# Patient Record
Sex: Female | Born: 1960 | Race: Black or African American | Hispanic: No | Smoking: Former smoker
Health system: Southern US, Community
[De-identification: ages and names within clinical notes are randomized; demographics above are authoritative.]

## PROBLEM LIST (undated history)

## (undated) DIAGNOSIS — I10 Essential (primary) hypertension: Secondary | ICD-10-CM

## (undated) DIAGNOSIS — R0602 Shortness of breath: Secondary | ICD-10-CM

## (undated) DIAGNOSIS — E538 Deficiency of other specified B group vitamins: Secondary | ICD-10-CM

## (undated) DIAGNOSIS — J449 Chronic obstructive pulmonary disease, unspecified: Secondary | ICD-10-CM

## (undated) DIAGNOSIS — E119 Type 2 diabetes mellitus without complications: Secondary | ICD-10-CM

## (undated) DIAGNOSIS — I509 Heart failure, unspecified: Secondary | ICD-10-CM

## (undated) HISTORY — PX: ECTOPIC PREGNANCY SURGERY: SHX613

## (undated) HISTORY — DX: Heart failure, unspecified: I50.9

## (undated) HISTORY — DX: Morbid (severe) obesity due to excess calories: E66.01

## (undated) HISTORY — DX: Essential (primary) hypertension: I10

## (undated) HISTORY — PX: KNEE SURGERY: SHX244

## (undated) HISTORY — DX: Shortness of breath: R06.02

## (undated) HISTORY — DX: Chronic obstructive pulmonary disease, unspecified: J44.9

## (undated) HISTORY — DX: Deficiency of other specified B group vitamins: E53.8

---

## 2010-08-25 ENCOUNTER — Emergency Department (HOSPITAL_COMMUNITY): Admission: EM | Admit: 2010-08-25 | Discharge: 2010-08-25 | Payer: Self-pay | Admitting: Emergency Medicine

## 2011-01-07 ENCOUNTER — Inpatient Hospital Stay (HOSPITAL_COMMUNITY)
Admission: EM | Admit: 2011-01-07 | Discharge: 2011-01-09 | DRG: 204 | Disposition: A | Discharge status: Home / Self Care | Payer: Medicaid Other | Attending: Family Medicine | Admitting: Family Medicine

## 2011-01-07 ENCOUNTER — Emergency Department (HOSPITAL_COMMUNITY): Payer: Medicaid Other

## 2011-01-07 DIAGNOSIS — I059 Rheumatic mitral valve disease, unspecified: Secondary | ICD-10-CM | POA: Diagnosis present

## 2011-01-07 DIAGNOSIS — I110 Hypertensive heart disease with heart failure: Secondary | ICD-10-CM | POA: Diagnosis present

## 2011-01-07 DIAGNOSIS — J449 Chronic obstructive pulmonary disease, unspecified: Secondary | ICD-10-CM | POA: Diagnosis present

## 2011-01-07 DIAGNOSIS — I5032 Chronic diastolic (congestive) heart failure: Secondary | ICD-10-CM | POA: Diagnosis present

## 2011-01-07 DIAGNOSIS — J4489 Other specified chronic obstructive pulmonary disease: Secondary | ICD-10-CM | POA: Diagnosis present

## 2011-01-07 DIAGNOSIS — I279 Pulmonary heart disease, unspecified: Secondary | ICD-10-CM | POA: Diagnosis present

## 2011-01-07 DIAGNOSIS — R0602 Shortness of breath: Principal | ICD-10-CM | POA: Diagnosis present

## 2011-01-07 LAB — BRAIN NATRIURETIC PEPTIDE: Pro B Natriuretic peptide (BNP): 141 pg/mL — ABNORMAL HIGH (ref 0.0–100.0)

## 2011-01-07 LAB — URINALYSIS, ROUTINE W REFLEX MICROSCOPIC
Bilirubin Urine: NEGATIVE
Glucose, UA: NEGATIVE mg/dL
Glucose, UA: NEGATIVE mg/dL
Ketones, ur: 15 mg/dL — AB
Ketones, ur: NEGATIVE mg/dL
Nitrite: NEGATIVE
Nitrite: NEGATIVE
Protein, ur: >300 mg/dL — AB
Protein, ur: NEGATIVE mg/dL
Specific Gravity, Urine: 1.01 (ref 1.005–1.030)
Specific Gravity, Urine: 1.023 (ref 1.005–1.030)
Urobilinogen, UA: 1 mg/dL (ref 0.0–1.0)
Urobilinogen, UA: 2 mg/dL — ABNORMAL HIGH (ref 0.0–1.0)
pH: 5.5 (ref 5.0–8.0)
pH: 6 (ref 5.0–8.0)

## 2011-01-07 LAB — BASIC METABOLIC PANEL
BUN: 20 mg/dL (ref 6–23)
CO2: 30 mEq/L (ref 19–32)
Calcium: 8.6 mg/dL (ref 8.4–10.5)
Chloride: 103 mEq/L (ref 96–112)
Creatinine, Ser: 0.99 mg/dL (ref 0.4–1.2)
GFR calc Af Amer: >60 mL/min (ref >60)
GFR calc non Af Amer: 59 mL/min — ABNORMAL LOW (ref >60)
Glucose, Bld: 97 mg/dL (ref 70–99)
Potassium: 3.6 mEq/L (ref 3.5–5.1)
Sodium: 139 mEq/L (ref 135–145)

## 2011-01-07 LAB — CBC
HCT: 33.7 % — ABNORMAL LOW (ref 36.0–46.0)
Hemoglobin: 10.1 g/dL — ABNORMAL LOW (ref 12.0–15.0)
MCH: 21.9 pg — ABNORMAL LOW (ref 26.0–34.0)
MCHC: 30 g/dL (ref 30.0–36.0)
MCV: 73.1 fL — ABNORMAL LOW (ref 78.0–100.0)
Platelets: 471 10*3/uL — ABNORMAL HIGH (ref 150–400)
RBC: 4.61 MIL/uL (ref 3.87–5.11)
RDW: 21.4 % — ABNORMAL HIGH (ref 11.5–15.5)
WBC: 11.1 10*3/uL — ABNORMAL HIGH (ref 4.0–10.5)

## 2011-01-07 LAB — URINE MICROSCOPIC-ADD ON

## 2011-01-07 LAB — DIFFERENTIAL
Basophils Absolute: 0 10*3/uL (ref 0.0–0.1)
Eosinophils Absolute: 0.1 10*3/uL (ref 0.0–0.7)
Lymphocytes Relative: 26 % (ref 12–46)
Monocytes Absolute: 1 10*3/uL (ref 0.1–1.0)
Neutrophils Relative %: 64 % (ref 43–77)

## 2011-01-07 LAB — POCT CARDIAC MARKERS
CKMB, poc: 1.8 ng/mL (ref 1.0–8.0)
Myoglobin, poc: 105 ng/mL (ref 12–200)

## 2011-01-08 DIAGNOSIS — I509 Heart failure, unspecified: Secondary | ICD-10-CM

## 2011-01-08 DIAGNOSIS — I10 Essential (primary) hypertension: Secondary | ICD-10-CM

## 2011-01-08 DIAGNOSIS — J449 Chronic obstructive pulmonary disease, unspecified: Secondary | ICD-10-CM

## 2011-01-08 DIAGNOSIS — R0602 Shortness of breath: Secondary | ICD-10-CM

## 2011-01-08 LAB — COMPREHENSIVE METABOLIC PANEL
AST: 81 U/L — ABNORMAL HIGH (ref 0–37)
Albumin: 3.2 g/dL — ABNORMAL LOW (ref 3.5–5.2)
Alkaline Phosphatase: 147 U/L — ABNORMAL HIGH (ref 39–117)
BUN: 20 mg/dL (ref 6–23)
Chloride: 103 mEq/L (ref 96–112)
GFR calc Af Amer: >60 mL/min (ref >60)
Potassium: 3.3 mEq/L — ABNORMAL LOW (ref 3.5–5.1)
Total Protein: 6.8 g/dL (ref 6.0–8.3)

## 2011-01-08 LAB — URINALYSIS, ROUTINE W REFLEX MICROSCOPIC
Bilirubin Urine: NEGATIVE
Glucose, UA: NEGATIVE mg/dL
Hgb urine dipstick: NEGATIVE
Protein, ur: NEGATIVE mg/dL
Specific Gravity, Urine: 1.017 (ref 1.005–1.030)

## 2011-01-08 LAB — CBC
HCT: 33.5 % — ABNORMAL LOW (ref 36.0–46.0)
Hemoglobin: 10 g/dL — ABNORMAL LOW (ref 12.0–15.0)
MCH: 21.7 pg — ABNORMAL LOW (ref 26.0–34.0)
MCV: 72.7 fL — ABNORMAL LOW (ref 78.0–100.0)
Platelets: 430 10*3/uL — ABNORMAL HIGH (ref 150–400)
RBC: 4.61 MIL/uL (ref 3.87–5.11)
WBC: 11.2 10*3/uL — ABNORMAL HIGH (ref 4.0–10.5)

## 2011-01-08 LAB — CARDIAC PANEL(CRET KIN+CKTOT+MB+TROPI)
CK, MB: 2.8 ng/mL (ref 0.3–4.0)
Relative Index: 1.7 (ref 0.0–2.5)

## 2011-01-09 DIAGNOSIS — I10 Essential (primary) hypertension: Secondary | ICD-10-CM

## 2011-01-09 LAB — COMPREHENSIVE METABOLIC PANEL
BUN: 19 mg/dL (ref 6–23)
CO2: 34 mEq/L — ABNORMAL HIGH (ref 19–32)
Chloride: 100 mEq/L (ref 96–112)
Creatinine, Ser: 0.99 mg/dL (ref 0.4–1.2)
GFR calc non Af Amer: 59 mL/min — ABNORMAL LOW (ref >60)
Total Bilirubin: 0.8 mg/dL (ref 0.3–1.2)

## 2011-01-09 LAB — URINE CULTURE
Colony Count: NO GROWTH
Culture: NO GROWTH

## 2011-01-09 LAB — CBC
Hemoglobin: 10.2 g/dL — ABNORMAL LOW (ref 12.0–15.0)
RBC: 4.78 MIL/uL (ref 3.87–5.11)
WBC: 8.8 10*3/uL (ref 4.0–10.5)

## 2011-01-15 ENCOUNTER — Inpatient Hospital Stay (INDEPENDENT_AMBULATORY_CARE_PROVIDER_SITE_OTHER)
Admission: RE | Admit: 2011-01-15 | Discharge: 2011-01-15 | Disposition: A | Discharge status: Home / Self Care | Payer: Self-pay | Source: Ambulatory Visit

## 2011-01-15 DIAGNOSIS — Z0489 Encounter for examination and observation for other specified reasons: Secondary | ICD-10-CM

## 2011-01-15 DIAGNOSIS — IMO0002 Reserved for concepts with insufficient information to code with codable children: Secondary | ICD-10-CM

## 2011-01-15 NOTE — Discharge Summary (Signed)
Kathryn, Mahoney             ACCOUNT NO.:  [id number]  MEDICAL RECORD NO.:  [id number]           PATIENT TYPE:  I  LOCATION:  2603                         FACILITY:  [facility]  PHYSICIAN:  Mikeal Winstanley A. Sheffield Slider, M.D.    DATE OF BIRTH:  15-Apr-1961  DATE OF ADMISSION:  01/07/2011 DATE OF DISCHARGE:  01/09/2011                              DISCHARGE SUMMARY   DISCHARGE DIAGNOSES: 1. Shortness of breath. 2. Asthma. 3  Diastolic congestive heart failure. 1. Hypertension.  DISCHARGE MEDICATIONS:  New medications: 1. Norvasc 10 mg p.o. daily. 2. Lasix 40 mg p.o. q.a.m. and 20 mg p.o. q.p.m. 3. Hydrochlorothiazide 25 mg p.o. daily. 4. K-Dur 20 mEq p.o. daily. 5. Lisinopril 10 mg p.o. daily. 6. Over-the-counter iron supplement. 7. Over-the-counter vitamin B12 supplement. 8. Albuterol inhaler.  IMAGING/STUDIES: 1. She had a chest x-ray.  Impression:  Marked cardiomegaly with     pulmonary vascular congestion. 2. She had an echocardiogram.  Impression:  Left ventricle ejection     fraction with 50-55% with pattern of mild LVH.  Mitral valve with     moderate regurg, left atrium mildly dilated, tricuspid valve with     moderate regurg, PA peak pressure was 51 mmHg.  LABORATORY DATA:  CBC with WBC 11.1, hemoglobin 10.1, hematocrit 33.7, and platelets of 471.  Sodium 139, potassium 3.6, chloride 103, bicarb 30, BUN of 20, creatinine 0.99, and glucose of 97.  UA showed large blood, greater than 300 protein, moderate amount of leukocytes, negative for nitrite.  Repeat UA was negative for protein, had large amount of blood, small amount of leukocytes.  BNP was 141. Positive for MRSA. Cardiac enzymes were negative.  Urine culture with no growth.  At the time of discharge, WBCs 8.8, hemoglobin 10.2, hematocrit 34.9, and platelets 239.  CMP with sodium 144, potassium 3.9, chloride 100, bicarb 34, glucose 79, BUN of 19, creatinine 0.99, elevated alk phos at 137, AST 49, and ALT 13.  BRIEF  HOSPITAL COURSE:  This is a 50 year old female with past medical history of COPD/asthma and history of leaking heart valve per her history.  She presents with increased shortness of breath and lower extremity edema. 1. Shortness of breath:  This was thought to be secondary to chronic     obstructive pulmonary disease or asthma.  However, she was not     wheezing on exam and her pulse ox was 98% on room air.  She does     not have a history of congestive heart failure, however, she was     told in the past she had a leaking valve.  She did also have lower     extremity edema.  Her exam was negative for crackles and BNP was     only mildly elevated.  Echocardiogram was done, which did show     leaking of mitral as well as tricuspid valve.  She was given Lasix     during the hospitalization and her shortness of breath improved.     She was also given albuterol during the hospitalization, which also     helped with her shortness of breath.  Shortness of breath is likely     multifactorial.  She will likely need pulmonary function studies     once she is fully improved on an outpatient basis.  We will plan to     discharge her home with Lasix. 2. Hypertension:  Initially only taking lisinopril.  This was     continued during her hospitalization.  Norvasc was also added to     her regimen as well as hydrochlorothiazide.  She tolerated this     well and her pressures improved. 3. Question of urinary tract infection:  Initial UA consistent with     possible urinary tract infection, however, the patient was     completely asymptomatic.  A repeat UA was obtained, which still     showed small amount of leukocytes and blood.  However, this was a     cath specimen, so this was thought to be secondary to trauma from     catheterization.  Since the patient was asymptomatic and afebrile,     it was elected that this will not be treated at this time.  DISCHARGE INSTRUCTIONS:  The patient was instructed  to increase activity slowly, follow a low-sodium, heart-healthy diet, and contact [facility] for locating a doctor within the area.  She is instructed that she should make a followup appointment within the next month.  DISCHARGE CONDITION:  The patient is discharged home in stable medical condition.    ______________________________ Everrett Coombe, MD   ______________________________ Arnette Norris. Sheffield Slider, M.D.    CM/MEDQ  D:  01/12/2011  T:  01/13/2011  Job:  [id number]  Electronically Signed by Everrett Coombe MD on 01/14/2011 02:34:31 PM Electronically Signed by Zachery Dauer M.D. on 01/15/2011 04:26:15 PM

## 2011-01-16 NOTE — Consult Note (Signed)
[name], [name] NO.:  [id number]  MEDICAL RECORD NO.:  [id number]           PATIENT TYPE:  I  LOCATION:  2603                         FACILITY:  [facility]  PHYSICIAN:  Keah Lamba M. [place], M.D.  DATE OF BIRTH:  1961/01/31  DATE OF CONSULTATION:  01/08/2011 DATE OF DISCHARGE:                                CONSULTATION   HISTORY OF PRESENT ILLNESS:  Kathryn Mahoney is a 50 year old African American female that we are asked to see at the request of the [facility] for evaluation of dyspnea and murmur.  Kathryn Mahoney reports being admitted to [facility] 2 weeks ago with similar complaints.  At that time, Kathryn Mahoney was in the hospital for 3 days for management of the severe high blood pressure.  Kathryn Mahoney was discharged on a combination of lisinopril, HCTZ, and a "green pill" at 300 mg.  Kathryn Mahoney states Kathryn Mahoney could not afford the green pill initially.  Kathryn Mahoney took the others, but states that the HCTZ made her swell and have nausea.  Kathryn Mahoney stopped these medications and then started taking the green pill again. Kathryn Mahoney had to return to the emergency department for continued dyspnea. Kathryn Mahoney was told to go back on the lisinopril and HCTZ.  Kathryn Mahoney was admitted here yesterday with worsening dyspnea.  Kathryn Mahoney denies any chest pain.  Kathryn Mahoney has had some cough.  Kathryn Mahoney complains of edema.  Kathryn Mahoney has had no an outpatient medical followup.  The patient does have a 30-pack-year history of smoking, but states that Kathryn Mahoney has quit.  PAST MEDICAL HISTORY: 1. COPD. 2. Tubal pregnancy 3. History of leaky heart valve.  PRIOR SURGERIES:  C-section.  CURRENT MEDICATIONS:  Kathryn Mahoney had been on IV nitroglycerin drip, this was discontinued today.  Kathryn Mahoney has been on Norvasc 5 mg daily and Lasix 40 mg IV b.i.d.  Today, Kathryn Mahoney was started on Coreg 3.125 mg b.i.d. and HCTZ 12.5 mg daily.  Kathryn Mahoney states the Coreg made her very short of breath after taking it.  Kathryn Mahoney is also on lisinopril 10 mg p.o. daily.  ALLERGIES:  Kathryn Mahoney  has no known allergies.  FAMILY HISTORY:  Her mother had a history of stroke.  Father has history of prostate cancer.  SOCIAL HISTORY:  Kathryn Mahoney is currently unemployed.  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  Kathryn Mahoney has four children. Kathryn Mahoney has a 30-pack-year history of smoking, but denies alcohol use.  REVIEW OF SYSTEMS:  Kathryn Mahoney denies any nausea or vomiting currently.  Kathryn Mahoney does complain of wheezing and cough.  Kathryn Mahoney has had no fever or chills. There has been no recent change in diet.  All other systems were reviewed and are negative.  PHYSICAL EXAMINATION:  GENERAL:  Kathryn Mahoney is a morbidly obese black female in mild respiratory distress. VITAL SIGNS:  Blood pressure was 160/104.  Pulse was 110 and sinus rhythm.  Kathryn Mahoney is afebrile.  Sats are 98% on room air. HEENT:  Kathryn Mahoney is normocephalic and atraumatic.  Pupils are equal, round, and reactive to light and accommodation.  Sclerae are clear.  Oropharynx is clear. NECK:  Supple without JVD, adenopathy, thyromegaly, or  bruits. LUNGS:  Bilateral diffuse expiratory wheezes. CARDIAC:  Tachycardic and regular rhythm.  There is a positive S4.  Kathryn Mahoney has a grade 2/6 systolic murmur at the apex that is holosystolic. ABDOMEN:  Obese, soft, and nontender.  Kathryn Mahoney has no masses or splenomegaly. EXTREMITIES:  Large, but without pitting edema.  Kathryn Mahoney has trace edema. SKIN:  Warm and dry. NEUROLOGIC:  Kathryn Mahoney is alert and oriented x3.  Cranial nerves II-XII are intact.  LABORATORY DATA:  ECG demonstrates sinus tachycardia with otherwise normal ECG.  Chest x-ray shows cardiomegaly with passive vascular congestion.  Sodium is 141, potassium 3.3, chloride 103, CO2 of 28, BUN 20, creatinine 0.98, and glucose of 111.  AST is 81 and ALT is 129. White count 11,200, hemoglobin 10.0, hematocrit 33.5, and platelets 430,000.  Urinalysis is negative.  BNP level is 141.  Echocardiogram today showed mild left ventricular enlargement with mild LVH and  overall ejection fraction of 50-55%.  There was moderate mitral insufficiency. The left atrium was mildly dilated.  There was moderate tricuspid insufficiency.  There was moderate pulmonary hypertension with an estimated right ventricular systolic pressure of 51 mmHg.  IMPRESSION: 1. Severe uncontrolled hypertension with hypertensive heart disease. 2. Moderate mitral insufficiency. 3. Moderate pulmonary hypertension which is multifactorial including     her obesity and chronic obstructive pulmonary disease. 4. Chronic obstructive pulmonary disease with bronchospasm. 5. Prior history of tobacco abuse. 6. Morbid obesity.  PLAN:  The crucial thing from a cardiac standpoint is to adequately control her blood pressure.  I would avoid beta-blocker therapy given her active bronchospasm.  I think Kathryn Mahoney would do better on angiotensin receptor blocker instead of an ACE inhibitor given her cough.  I would continue with diuretic therapy with Lasix probably instead of HCTZ given her previous intolerance.  I think Kathryn Mahoney clearly needs diuretic therapy for her volume overload.  I think amlodipine or hydralazine are appropriate therapies.  I would avoid agents with a negative inotropic effects.  We also need to aggressively treat her bronchospasm.  I agree the sleep study would be appropriate as an outpatient to rule out sleep apnea as a component of her pulmonary hypertension and to help regulate her blood pressure and her breathing difficulties.  I have stressed the importance of close outpatient medical followup with physician to adjust her medications and have recommended that Kathryn Mahoney not make changes in her medications without direct supervision.  Kathryn Mahoney needs to remain on a low- sodium diet.  I suspect that this patient has had longstanding severe hypertension.  I do think that with appropriate blood pressure control her mitral insufficiency will improve.  I do not feel that Kathryn Mahoney needs any other cardiac  testing at this point.          ______________________________ Dura Mccormack M. [place], M.D.     PMJ/MEDQ  D:  01/08/2011  T:  01/09/2011  Job:  [id number]  Electronically Signed by Radiance Deady [place] M.D. on 01/16/2011 03:40:23 PM

## 2011-02-12 NOTE — H&P (Signed)
NAMESHIRLEY, DECAMP             ACCOUNT NO.:  [id number]  MEDICAL RECORD NO.:  [id number]           PATIENT TYPE:  I  LOCATION:  2603                         FACILITY:  [facility]  PHYSICIAN:  Kabe Mckoy A. Sheffield Slider, M.D.    DATE OF BIRTH:  Oct 28, 1960  DATE OF ADMISSION:  01/07/2011 DATE OF DISCHARGE:                             HISTORY & PHYSICAL   CHIEF COMPLAINT:  Shortness of breath.  PRIMARY CARE PROVIDER:  Unassigned.  HISTORY OF PRESENT ILLNESS:  The patient is a 50 year old female admitted for shortness of breath x1 week.  Shortness of breath worsens with ambulation.  The patient denies chest pain, endorses swelling in legs and abdomen time 1-2 weeks, had a recent admission to [facility] for similar complaint episode.  The patient was started on blood pressure medications and a water pill but they made her so nauseated, so she stopped taking them, only taking lisinopril now, did not follow up with PCP after discharge from [facility].  The patient has no known history of CHF.  Positive past medical history of COPD.  REVIEW OF SYSTEMS:  No fever.  Positive occasional cough.  Positive occasional constipation.  No urinary problems.  No dysuria, retention, or frequency.  ALLERGIES:  No known drug allergies.  MEDICATION:  Lisinopril unknown dose.  The patient states she is also on a water pill, unsure of the name but has not been taking it secondary to side effects of nausea.  PAST MEDICAL HISTORY: 1. Tubal pregnancy. 2. COPD. 3. A leaking valve.  PAST SURGICAL HISTORY:  C-section.  FAMILY HISTORY:  Mother - CVA.  Father - prostate cancer.  Siblings - healthy.  SOCIAL HISTORY:  The patient was recently let go from work secondary to January hospital stay at [facility].  She was working in the Dietitian at [employer].  She has four children.  She denies tobacco, drugs, or alcohol use.  PHYSICAL EXAMINATION:  VITAL SIGNS:  Blood  pressure 172/117, heart rate 116, respiratory rate 28, temperature 98.3, pulse ox 98% on room air. GENERAL:  In no acute distress, sitting on side of bed, O2 in place. HEENT:  No lymphadenopathy.  Pupils equal, round, and reactive to light and accommodation. LUNGS:  Clear to auscultation bilateral, diminished in bases bilateral. CARDIOVASCULAR:  Regular rate and rhythm.  No murmurs, rubs, or gallops. ABDOMEN:  Soft, nontender, trace edema. EXTREMITIES:  A 1+ edema in lower extremities, trace edema in upper leg. NEUROLOGIC:  Alert and oriented x3, following all commands.  LABORATORY DATA AND STUDIES:  BNP 141.  Urine micro, rbc's too numerous to count, many bacteria.  UA moderate bili, 15 ketones, large blood, greater than 300 protein, 2.0 urobilinogen, and moderate leukocytes. Sodium 139, potassium 3.6, creatinine 0.99, glucose 97, calcium 8.6. Hemoglobin 10.11 and white blood cells 11.1.  Point-of-care cardiac enzymes within normal limits. Chest x-ray showed marked cardiomegaly with pulmonary vascular congestion. EKG showed sinus tach and no acute ischemia.  ASSESSMENT AND PLAN:  The patient is a 50 year old female with a history of chronic obstructive pulmonary disease admitted for shortness of breath. 1. Shortness  of breath may be secondary to chronic obstructive     pulmonary disease although diminished in bases.  No wheezing and     she is 98% on pulse ox on room air.  No history of congestive heart     failure but lower extremity edema is suggestive.  Had a recent     workup at [facility] in January for similar episode, we     will request records in the morning.  If no echo recently done, we     will order echo for eval of EF.  We will recheck EKG in the     morning.  We will recheck cardiac enzymes to rule out myocardial     infarction, cause of shortness of breath.  The patient is     noncompliant with meds.  She was discharged from hospital in     January and she  was not compliant with this medication secondary to     side effect of nausea.  The patient needs to be established with a     PCP prior to discharge. 2. Hypertension.  Only taking lisinopril currently.  We will continue     this for now during hospital stay.  We will consider adding Norvasc     is blood pressure remains elevated.  We will not had beta-blocker     at this time since the patient denies use of this medication prior     to arrival, and the patient may be in acute congestive heart     failure exacerbation. 3. Questionable urinary tract infection.  Uncertain if clean catch is     accurate.  We will obtain in-and-out cath urine and repeat UA and     micro and urine culture.  The patient has no symptoms of urinary     tract infection with questionable urinary tract infection finding     on clean-catch UA. 4. History of chronic obstructive pulmonary disease.  We will have     Pharmacy obtain [facility].  We will start any home medication she     takes for this issue.  No wheezing on exam, we will give nebs     p.r.n. 5. Diet.  Heart healthy, low sodium. 6. Prophylaxis.  Heparin 5000 units t.i.d. subcu. 7. Disposition.  Pending clinical improvement.     Ellin Mayhew, MD   ______________________________ Arnette Norris. Sheffield Slider, M.D.    DC/MEDQ  D:  01/08/2011  T:  01/08/2011  Job:  [id number]  Electronically Signed by Ellin Mayhew  on 02/11/2011 02:20:02 PM Electronically Signed by Zachery Dauer M.D. on 02/12/2011 04:41:55 AM

## 2011-04-10 ENCOUNTER — Inpatient Hospital Stay (INDEPENDENT_AMBULATORY_CARE_PROVIDER_SITE_OTHER)
Admission: RE | Admit: 2011-04-10 | Discharge: 2011-04-10 | Disposition: A | Discharge status: Home / Self Care | Payer: Self-pay | Source: Ambulatory Visit | Attending: Family Medicine | Admitting: Family Medicine

## 2011-04-10 DIAGNOSIS — N952 Postmenopausal atrophic vaginitis: Secondary | ICD-10-CM

## 2011-04-10 LAB — WET PREP, GENITAL
Trich, Wet Prep: NONE SEEN
Yeast Wet Prep HPF POC: NONE SEEN

## 2011-04-11 LAB — GC/CHLAMYDIA PROBE AMP, GENITAL: GC Probe Amp, Genital: NEGATIVE

## 2011-04-14 LAB — POCT URINALYSIS DIP (DEVICE)
Bilirubin Urine: NEGATIVE
Glucose, UA: NEGATIVE mg/dL
Hgb urine dipstick: NEGATIVE
Nitrite: NEGATIVE
Specific Gravity, Urine: 1.025 (ref 1.005–1.030)

## 2011-05-12 ENCOUNTER — Emergency Department (HOSPITAL_COMMUNITY)
Admission: EM | Admit: 2011-05-12 | Discharge: 2011-05-12 | Disposition: A | Discharge status: Home / Self Care | Payer: Medicaid Other | Attending: Emergency Medicine | Admitting: Emergency Medicine

## 2011-05-12 ENCOUNTER — Emergency Department (HOSPITAL_COMMUNITY): Payer: Medicaid Other

## 2011-05-12 DIAGNOSIS — J4489 Other specified chronic obstructive pulmonary disease: Secondary | ICD-10-CM | POA: Insufficient documentation

## 2011-05-12 DIAGNOSIS — J40 Bronchitis, not specified as acute or chronic: Secondary | ICD-10-CM | POA: Insufficient documentation

## 2011-05-12 DIAGNOSIS — R0602 Shortness of breath: Secondary | ICD-10-CM | POA: Insufficient documentation

## 2011-05-12 DIAGNOSIS — I10 Essential (primary) hypertension: Secondary | ICD-10-CM | POA: Insufficient documentation

## 2011-05-12 DIAGNOSIS — J029 Acute pharyngitis, unspecified: Secondary | ICD-10-CM | POA: Insufficient documentation

## 2011-05-12 DIAGNOSIS — J449 Chronic obstructive pulmonary disease, unspecified: Secondary | ICD-10-CM | POA: Insufficient documentation

## 2011-05-12 LAB — URINALYSIS, ROUTINE W REFLEX MICROSCOPIC
Bilirubin Urine: NEGATIVE
Hgb urine dipstick: NEGATIVE
Specific Gravity, Urine: 1.024 (ref 1.005–1.030)
Urobilinogen, UA: 1 mg/dL (ref 0.0–1.0)

## 2011-05-13 LAB — URINE CULTURE
Colony Count: NO GROWTH
Culture: NO GROWTH

## 2011-06-17 ENCOUNTER — Ambulatory Visit (INDEPENDENT_AMBULATORY_CARE_PROVIDER_SITE_OTHER): Payer: Medicaid Other

## 2011-06-17 ENCOUNTER — Inpatient Hospital Stay (INDEPENDENT_AMBULATORY_CARE_PROVIDER_SITE_OTHER)
Admission: RE | Admit: 2011-06-17 | Discharge: 2011-06-17 | Disposition: A | Discharge status: Home / Self Care | Payer: Medicaid Other | Source: Ambulatory Visit | Attending: Emergency Medicine | Admitting: Emergency Medicine

## 2011-06-17 ENCOUNTER — Emergency Department (HOSPITAL_COMMUNITY)
Admission: EM | Admit: 2011-06-17 | Discharge: 2011-06-17 | Disposition: A | Discharge status: Home / Self Care | Payer: Medicaid Other | Attending: Emergency Medicine | Admitting: Emergency Medicine

## 2011-06-17 DIAGNOSIS — I509 Heart failure, unspecified: Secondary | ICD-10-CM

## 2011-06-17 DIAGNOSIS — Z760 Encounter for issue of repeat prescription: Secondary | ICD-10-CM | POA: Insufficient documentation

## 2011-06-17 DIAGNOSIS — I10 Essential (primary) hypertension: Secondary | ICD-10-CM | POA: Insufficient documentation

## 2011-06-17 LAB — POCT I-STAT, CHEM 8
Calcium, Ion: 1.21 mmol/L (ref 1.12–1.32)
Glucose, Bld: 94 mg/dL (ref 70–99)
HCT: 37 % (ref 36.0–46.0)
Hemoglobin: 12.6 g/dL (ref 12.0–15.0)
TCO2: 28 mmol/L (ref 0–100)

## 2011-06-26 ENCOUNTER — Emergency Department (HOSPITAL_COMMUNITY)
Admission: EM | Admit: 2011-06-26 | Discharge: 2011-06-26 | Disposition: A | Discharge status: Home / Self Care | Payer: Medicaid Other | Attending: Emergency Medicine | Admitting: Emergency Medicine

## 2011-06-26 ENCOUNTER — Emergency Department (HOSPITAL_COMMUNITY): Payer: Medicaid Other

## 2011-06-26 DIAGNOSIS — R0602 Shortness of breath: Secondary | ICD-10-CM | POA: Insufficient documentation

## 2011-06-26 DIAGNOSIS — J4489 Other specified chronic obstructive pulmonary disease: Secondary | ICD-10-CM | POA: Insufficient documentation

## 2011-06-26 DIAGNOSIS — I10 Essential (primary) hypertension: Secondary | ICD-10-CM | POA: Insufficient documentation

## 2011-06-26 DIAGNOSIS — J449 Chronic obstructive pulmonary disease, unspecified: Secondary | ICD-10-CM | POA: Insufficient documentation

## 2011-06-26 DIAGNOSIS — R059 Cough, unspecified: Secondary | ICD-10-CM | POA: Insufficient documentation

## 2011-06-26 DIAGNOSIS — Z79899 Other long term (current) drug therapy: Secondary | ICD-10-CM | POA: Insufficient documentation

## 2011-06-26 DIAGNOSIS — I509 Heart failure, unspecified: Secondary | ICD-10-CM | POA: Insufficient documentation

## 2011-06-26 DIAGNOSIS — R05 Cough: Secondary | ICD-10-CM | POA: Insufficient documentation

## 2011-07-18 ENCOUNTER — Institutional Professional Consult (permissible substitution): Payer: Medicaid Other | Admitting: Critical Care Medicine

## 2011-07-24 ENCOUNTER — Inpatient Hospital Stay (INDEPENDENT_AMBULATORY_CARE_PROVIDER_SITE_OTHER)
Admission: RE | Admit: 2011-07-24 | Discharge: 2011-07-24 | Disposition: A | Discharge status: Home / Self Care | Payer: Medicaid Other | Source: Ambulatory Visit | Attending: Family Medicine | Admitting: Family Medicine

## 2011-07-24 DIAGNOSIS — M545 Low back pain: Secondary | ICD-10-CM

## 2011-07-24 DIAGNOSIS — J069 Acute upper respiratory infection, unspecified: Secondary | ICD-10-CM

## 2011-07-24 DIAGNOSIS — N760 Acute vaginitis: Secondary | ICD-10-CM

## 2011-07-30 ENCOUNTER — Institutional Professional Consult (permissible substitution): Payer: Medicaid Other | Admitting: Critical Care Medicine

## 2011-08-02 ENCOUNTER — Emergency Department (HOSPITAL_COMMUNITY): Payer: Medicaid Other

## 2011-08-02 ENCOUNTER — Emergency Department (HOSPITAL_COMMUNITY)
Admission: EM | Admit: 2011-08-02 | Discharge: 2011-08-02 | Disposition: A | Discharge status: Home / Self Care | Payer: Medicaid Other | Attending: Emergency Medicine | Admitting: Emergency Medicine

## 2011-08-02 DIAGNOSIS — J4489 Other specified chronic obstructive pulmonary disease: Secondary | ICD-10-CM | POA: Insufficient documentation

## 2011-08-02 DIAGNOSIS — J3489 Other specified disorders of nose and nasal sinuses: Secondary | ICD-10-CM | POA: Insufficient documentation

## 2011-08-02 DIAGNOSIS — J069 Acute upper respiratory infection, unspecified: Secondary | ICD-10-CM | POA: Insufficient documentation

## 2011-08-02 DIAGNOSIS — I10 Essential (primary) hypertension: Secondary | ICD-10-CM | POA: Insufficient documentation

## 2011-08-02 DIAGNOSIS — J449 Chronic obstructive pulmonary disease, unspecified: Secondary | ICD-10-CM | POA: Insufficient documentation

## 2011-08-02 DIAGNOSIS — I509 Heart failure, unspecified: Secondary | ICD-10-CM | POA: Insufficient documentation

## 2011-08-02 DIAGNOSIS — R059 Cough, unspecified: Secondary | ICD-10-CM | POA: Insufficient documentation

## 2011-08-02 DIAGNOSIS — R05 Cough: Secondary | ICD-10-CM | POA: Insufficient documentation

## 2011-08-04 ENCOUNTER — Encounter: Payer: Self-pay | Admitting: Critical Care Medicine

## 2011-08-04 ENCOUNTER — Ambulatory Visit (INDEPENDENT_AMBULATORY_CARE_PROVIDER_SITE_OTHER): Payer: Medicaid Other | Admitting: Critical Care Medicine

## 2011-08-04 DIAGNOSIS — I10 Essential (primary) hypertension: Secondary | ICD-10-CM | POA: Insufficient documentation

## 2011-08-04 DIAGNOSIS — J449 Chronic obstructive pulmonary disease, unspecified: Secondary | ICD-10-CM

## 2011-08-04 DIAGNOSIS — J309 Allergic rhinitis, unspecified: Secondary | ICD-10-CM

## 2011-08-04 DIAGNOSIS — I509 Heart failure, unspecified: Secondary | ICD-10-CM

## 2011-08-04 DIAGNOSIS — R32 Unspecified urinary incontinence: Secondary | ICD-10-CM | POA: Insufficient documentation

## 2011-08-04 MED ORDER — PREDNISONE 10 MG PO TABS: ORAL_TABLET | ORAL | Status: DC

## 2011-08-04 MED ORDER — ALBUTEROL SULFATE (2.5 MG/3ML) 0.083% IN NEBU: 2.5000 mg | INHALATION_SOLUTION | Freq: Four times a day (QID) | RESPIRATORY_TRACT | Status: DC

## 2011-08-04 MED ORDER — BECLOMETHASONE DIPROPIONATE 80 MCG/ACT IN AERS: 2.0000 | INHALATION_SPRAY | Freq: Two times a day (BID) | RESPIRATORY_TRACT | Status: DC

## 2011-08-04 MED ORDER — FLUTICASONE PROPIONATE 50 MCG/ACT NA SUSP: 2.0000 | Freq: Every day | NASAL | Status: DC

## 2011-08-04 MED ORDER — LOSARTAN POTASSIUM 100 MG PO TABS: 100.0000 mg | ORAL_TABLET | Freq: Every day | ORAL | Status: DC

## 2011-08-04 NOTE — Progress Notes (Signed)
Subjective:    Patient ID: Kathryn Mahoney, female    DOB: Oct 08, 1961, 50 y.o.   MRN: [id number]  HPI Comments: Hx of CHF in hosp for 3days.   Has cough and is incontinent. Hx of diastolic CHF.  COPD dx 1/12.  PFTs done. In hosp [facility].     Notes snoring,  Will awaken with sleepiness  Shortness of Breath This is a chronic problem. The current episode started more than 1 year ago. Episode frequency: shortness of breath with exertion only, ADLs. The problem has been gradually worsening. Associated symptoms include headaches, leg swelling, neck pain, orthopnea, PND, rhinorrhea, sputum production and wheezing. Pertinent negatives include no abdominal pain, chest pain, ear pain, fever, hemoptysis, leg pain, rash, sore throat or vomiting. The symptoms are aggravated by fumes, exercise, odors, pollens, weather changes, URIs and any activity. She has tried beta agonist inhalers for the symptoms. The treatment provided mild relief. Her past medical history is significant for asthma and COPD. There is no history of pneumonia.  Cough This is a chronic problem. The current episode started more than 1 year ago. The problem has been waxing and waning. The problem occurs every few hours. The cough is productive of sputum (yellow ). Associated symptoms include chills, headaches, myalgias, postnasal drip, rhinorrhea, shortness of breath and wheezing. Pertinent negatives include no chest pain, ear pain, fever, hemoptysis, rash or sore throat. The symptoms are aggravated by fumes, dust, pollens, lying down and exercise. She has tried a beta-agonist inhaler for the symptoms. Her past medical history is significant for asthma, bronchitis, COPD and environmental allergies. There is no history of emphysema or pneumonia.   Past Medical History  Diagnosis Date  . COPD (chronic obstructive pulmonary disease)   . CHF (congestive heart failure)   . SOB (shortness of breath)   . Urinary incontinence   . Asthma    . Hypertension      Family History  Problem Relation Age of Onset  . Stroke Mother   . Asthma Mother      History   Social History  . Marital Status: Single    Spouse Name: N/A    Number of Children: 4  . Years of Education: N/A   Occupational History  . Not on file.   Social History Main Topics  . Smoking status: Former Smoker -- 0.8 packs/day for 30 years    Types: Cigarettes    Quit date: 10/27/2010  . Smokeless tobacco: Never Used  . Alcohol Use: No  . Drug Use: No  . Sexually Active: Not on file   Other Topics Concern  . Not on file   Social History Narrative  . No narrative on file     No Known Allergies   No outpatient prescriptions prior to visit.       Review of Systems  Constitutional: Positive for chills, diaphoresis, activity change, appetite change, fatigue and unexpected weight change. Negative for fever.  HENT: Positive for congestion, facial swelling, rhinorrhea, sneezing, trouble swallowing, neck pain, voice change, postnasal drip, sinus pressure and tinnitus. Negative for hearing loss, ear pain, nosebleeds, sore throat, mouth sores, neck stiffness, dental problem and ear discharge.   Eyes: Positive for discharge. Negative for photophobia, itching and visual disturbance.  Respiratory: Positive for cough, sputum production, shortness of breath and wheezing. Negative for apnea, hemoptysis, choking, chest tightness and stridor.   Cardiovascular: Positive for palpitations, orthopnea, leg swelling and PND. Negative for chest pain.  Gastrointestinal: Positive for constipation and  abdominal distention. Negative for nausea, vomiting, abdominal pain and blood in stool.  Genitourinary: Positive for urgency, frequency and decreased urine volume. Negative for dysuria, hematuria, flank pain and difficulty urinating.  Musculoskeletal: Positive for myalgias and back pain. Negative for joint swelling, arthralgias and gait problem.  Skin: Negative for color  change, pallor and rash.  Neurological: Positive for weakness and headaches. Negative for dizziness, tremors, seizures, syncope, speech difficulty, light-headedness and numbness.  Hematological: Positive for environmental allergies. Negative for adenopathy. Does not bruise/bleed easily.  Psychiatric/Behavioral: Negative for confusion, sleep disturbance and agitation. The patient is not nervous/anxious.        Objective:   Physical Exam  Filed Vitals:   08/04/11 0941  BP: 150/120  Pulse: 100  Temp: 98.4 F (36.9 C)  TempSrc: Oral  Height: 5\' 9"  (1.753 m)  Weight: 275 lb (124.739 kg)  SpO2: 95%    Gen: Pleasant, well-nourished, in no distress,  normal affect  ENT: No lesions,  mouth clear,  oropharynx clear, no postnasal drip  Neck: No JVD, no TMG, no carotid bruits  Lungs: No use of accessory muscles, no dullness to percussion, exp wheeze, poor airflow  Cardiovascular: RRR, heart sounds normal, no murmur or gallops, no peripheral edema  Abdomen: soft and NT, no HSM,  BS normal  Musculoskeletal: No deformities, no cyanosis or clubbing  Neuro: alert, non focal  Skin: Warm, no lesions or rashes  10/4 CXR Cardiomegaly, peribronchial thickening    Assessment & Plan:   COPD (chronic obstructive pulmonary disease) Copd and asthma with asthmatic bronchitis flare, triggers include allergy, GERD, allergic rhinitis Note spirometry shows severe airflow obstruction    FeV1 42%    Plan Start prednisone 10mg  Take 4 for two days three for two days two for two days one for two days Start albuterol in nebulizer 3-4 times daily,  We will ask [facility] to obtain for you new nebulizer supplies Start Qvar two puff twice daily Start Fluticasone two puff each nostril daily   CHF (congestive heart failure) Diastolic CHF with HTN poorly controlled Plan Need to get on lasix/norvasc/Potassium/ Substitute losartan 100mg  for lisinopril      Updated Medication  List Outpatient Encounter Prescriptions as of 08/04/2011  Medication Sig Dispense Refill  . Cyanocobalamin (VITAMIN B12 PO) Take by mouth as needed.        . IRON PO Take by mouth as needed.        Marland Kitchen DISCONTD: lisinopril (PRINIVIL,ZESTRIL) 10 MG tablet Take 10 mg by mouth daily.        Marland Kitchen albuterol (PROVENTIL) (2.5 MG/3ML) 0.083% nebulizer solution Take 3 mLs (2.5 mg total) by nebulization 4 (four) times daily.  360 mL  6  . amLODipine (NORVASC) 10 MG tablet Take 10 mg by mouth daily.        . beclomethasone (QVAR) 80 MCG/ACT inhaler Inhale 2 puffs into the lungs 2 (two) times daily.  1 Inhaler  6  . fluticasone (FLONASE) 50 MCG/ACT nasal spray Place 2 sprays into the nose daily.  16 g  6  . furosemide (LASIX) 20 MG tablet Take 20 mg by mouth daily.       Marland Kitchen losartan (COZAAR) 100 MG tablet Take 1 tablet (100 mg total) by mouth daily.  30 tablet  6  . potassium chloride (KLOR-CON) 20 MEQ packet Take 20 mEq by mouth daily.        . predniSONE (DELTASONE) 10 MG tablet Take 4 for two days three for two  days two for two days one for two days  20 tablet  0  . DISCONTD: albuterol (PROVENTIL) (2.5 MG/3ML) 0.083% nebulizer solution Take 3 mLs (2.5 mg total) by nebulization 4 (four) times daily.  360 mL  6

## 2011-08-04 NOTE — Assessment & Plan Note (Signed)
Diastolic CHF with HTN poorly controlled Plan Need to get on lasix/norvasc/Potassium/ Substitute losartan 100mg  for lisinopril

## 2011-08-04 NOTE — Assessment & Plan Note (Addendum)
Copd and asthma with asthmatic bronchitis flare, triggers include allergy, GERD, allergic rhinitis Note spirometry shows severe airflow obstruction    FeV1 42%    Plan Start prednisone 10mg  Take 4 for two days three for two days two for two days one for two days Start albuterol in nebulizer 3-4 times daily,  We will ask [facility] to obtain for you new nebulizer supplies Start Qvar two puff twice daily Start Fluticasone two puff each nostril daily

## 2011-08-04 NOTE — Patient Instructions (Signed)
Get lasix, potassium , amlodipine filled Start prednisone 10mg  Take 4 for two days three for two days two for two days one for two days Start albuterol in nebulizer 3-4 times daily,  We will ask [facility] to obtain for you new nebulizer supplies Start Qvar two puff twice daily Start Fluticasone two puff each nostril daily Stop lisinopril Start Losartan one daily Obtain a primary care MD An overnight sleep oximetry [facility] Follow a reflux diet You may use Delsym over the counter for cough  Return [place] 1 month

## 2011-08-18 ENCOUNTER — Telehealth: Payer: Self-pay | Admitting: Critical Care Medicine

## 2011-08-18 DIAGNOSIS — J449 Chronic obstructive pulmonary disease, unspecified: Secondary | ICD-10-CM

## 2011-08-18 NOTE — Telephone Encounter (Signed)
02 ordered through [facility]

## 2011-08-18 NOTE — Telephone Encounter (Signed)
Called and spoke with pt.  Pt states she has been having low back pain and ankle pain.  States she is unsure if she hurt her back and ankle or if she has a UTI/ Kidney infection.  Informed pt to contact her pcp.  Pt states she doesn't have a pcp.  Therefore, recommended pt go to UC/ED to be evaluated.  Pt verbalized understanding.

## 2011-08-18 NOTE — Telephone Encounter (Signed)
ONO shows desat to 72% on RA

## 2011-08-25 ENCOUNTER — Encounter: Payer: Self-pay | Admitting: Critical Care Medicine

## 2011-08-26 ENCOUNTER — Inpatient Hospital Stay (INDEPENDENT_AMBULATORY_CARE_PROVIDER_SITE_OTHER)
Admission: RE | Admit: 2011-08-26 | Discharge: 2011-08-26 | Disposition: A | Discharge status: Home / Self Care | Payer: Medicaid Other | Source: Ambulatory Visit | Attending: Family Medicine | Admitting: Family Medicine

## 2011-08-26 DIAGNOSIS — N390 Urinary tract infection, site not specified: Secondary | ICD-10-CM

## 2011-08-26 DIAGNOSIS — J309 Allergic rhinitis, unspecified: Secondary | ICD-10-CM

## 2011-08-29 LAB — POCT URINALYSIS DIP (DEVICE)
Glucose, UA: NEGATIVE mg/dL
Leukocytes, UA: NEGATIVE
Nitrite: NEGATIVE
Protein, ur: NEGATIVE mg/dL
Urobilinogen, UA: 0.2 mg/dL (ref 0.0–1.0)

## 2011-09-01 ENCOUNTER — Encounter: Payer: Self-pay | Admitting: Critical Care Medicine

## 2011-09-01 ENCOUNTER — Ambulatory Visit (INDEPENDENT_AMBULATORY_CARE_PROVIDER_SITE_OTHER): Payer: Medicaid Other | Admitting: Critical Care Medicine

## 2011-09-01 DIAGNOSIS — J4489 Other specified chronic obstructive pulmonary disease: Secondary | ICD-10-CM

## 2011-09-01 DIAGNOSIS — I10 Essential (primary) hypertension: Secondary | ICD-10-CM

## 2011-09-01 DIAGNOSIS — J449 Chronic obstructive pulmonary disease, unspecified: Secondary | ICD-10-CM

## 2011-09-01 MED ORDER — AMLODIPINE BESYLATE 10 MG PO TABS: 10.0000 mg | ORAL_TABLET | Freq: Every day | ORAL | Status: DC

## 2011-09-01 MED ORDER — POTASSIUM CHLORIDE 20 MEQ PO PACK: 20.0000 meq | PACK | Freq: Every day | ORAL | Status: DC

## 2011-09-01 MED ORDER — FUROSEMIDE 20 MG PO TABS: 20.0000 mg | ORAL_TABLET | Freq: Every day | ORAL | Status: DC

## 2011-09-01 MED ORDER — BECLOMETHASONE DIPROPIONATE 40 MCG/ACT IN AERS: 2.0000 | INHALATION_SPRAY | Freq: Two times a day (BID) | RESPIRATORY_TRACT | Status: DC

## 2011-09-01 NOTE — Progress Notes (Signed)
Subjective:    Patient ID: Kathryn Mahoney, female    DOB: [birth year], 50 y.o.   MRN: [id number]  HPI Comments: Hx of CHF in hosp for 3days.   Has cough and is incontinent. Hx of diastolic CHF.  COPD dx 1/12.  PFTs done. In hosp [place] [facility].     Notes snoring,  Will awaken with sleepiness  Shortness of Breath This is a chronic problem. The current episode started more than 1 year ago. Episode frequency: shortness of breath with exertion only, ADLs. The problem has been rapidly improving. Associated symptoms include headaches. Pertinent negatives include no abdominal pain, chest pain, ear pain, fever, hemoptysis, leg pain, leg swelling, neck pain, orthopnea, PND, rash, rhinorrhea, sore throat, sputum production, vomiting or wheezing. The symptoms are aggravated by fumes, exercise, odors, pollens, weather changes, URIs and any activity. She has tried beta agonist inhalers for the symptoms. The treatment provided mild relief. Her past medical history is significant for asthma and COPD. There is no history of pneumonia.  Cough This is a chronic problem. The current episode started more than 1 year ago. The problem has been gradually improving. Cough characteristics: yellow  Associated symptoms include headaches, postnasal drip and shortness of breath. Pertinent negatives include no chest pain, chills, ear pain, fever, hemoptysis, myalgias, rash, rhinorrhea, sore throat or wheezing. The symptoms are aggravated by fumes, dust, pollens, lying down and exercise. She has tried a beta-agonist inhaler for the symptoms. Her past medical history is significant for asthma, bronchitis, COPD and environmental allergies. There is no history of emphysema or pneumonia.   09/01/2011 At last ov one month ago we rec: Start prednisone 10mg  Take 4 for two days three for two days two for two days one for two days  Start albuterol in nebulizer 3-4 times daily, We will ask [facility] to obtain for you new  nebulizer supplies  Start Qvar two puff twice daily  Start Fluticasone two puff each nostril daily  CHF (congestive heart failure)  Diastolic CHF with HTN poorly controlled  Plan  Need to get on lasix/norvasc/Potassium/  Substitute losartan 100mg  for lisinopril  No PCP available.  Has Medicaid now.   Now has a cough , is dry.  Now on oxygen at night.  Mouth is dry.  Dose is 1-2L. Notes dyspnea with exertion.  Not able to walk far .  Uses neb and qvar.    Past Medical History  Diagnosis Date  . COPD (chronic obstructive pulmonary disease)   . CHF (congestive heart failure)   . SOB (shortness of breath)   . Urinary incontinence   . Asthma   . Hypertension      Family History  Problem Relation Age of Onset  . Stroke Mother   . Asthma Mother      History   Social History  . Marital Status: Single    Spouse Name: N/A    Number of Children: 4  . Years of Education: N/A   Occupational History  . Not on file.   Social History Main Topics  . Smoking status: Former Smoker -- 0.8 packs/day for 30 years    Types: Cigarettes    Quit date: 10/27/2010  . Smokeless tobacco: Never Used  . Alcohol Use: No  . Drug Use: No  . Sexually Active: Not on file   Other Topics Concern  . Not on file   Social History Narrative  . No narrative on file     No Known Allergies  Outpatient Prescriptions Prior to Visit  Medication Sig Dispense Refill  . albuterol (PROVENTIL) (2.5 MG/3ML) 0.083% nebulizer solution Take 3 mLs (2.5 mg total) by nebulization 4 (four) times daily.  360 mL  6  . Cyanocobalamin (VITAMIN B12 PO) Take by mouth as needed.        . fluticasone (FLONASE) 50 MCG/ACT nasal spray Place 2 sprays into the nose daily.  16 g  6  . IRON PO Take by mouth as needed.        Marland Kitchen losartan (COZAAR) 100 MG tablet Take 1 tablet (100 mg total) by mouth daily.  30 tablet  6  . amLODipine (NORVASC) 10 MG tablet Take 10 mg by mouth daily.        . furosemide (LASIX) 20 MG tablet Take  20 mg by mouth daily.       . beclomethasone (QVAR) 80 MCG/ACT inhaler Inhale 2 puffs into the lungs 2 (two) times daily.  1 Inhaler  6  . potassium chloride (KLOR-CON) 20 MEQ packet Take 20 mEq by mouth daily.        . predniSONE (DELTASONE) 10 MG tablet Take 4 for two days three for two days two for two days one for two days  20 tablet  0       Review of Systems  Constitutional: Positive for diaphoresis, activity change, appetite change, fatigue and unexpected weight change. Negative for fever and chills.  HENT: Positive for congestion, facial swelling, sneezing, trouble swallowing, voice change, postnasal drip, sinus pressure and tinnitus. Negative for hearing loss, ear pain, nosebleeds, sore throat, rhinorrhea, mouth sores, neck pain, neck stiffness, dental problem and ear discharge.   Eyes: Positive for discharge. Negative for photophobia, itching and visual disturbance.  Respiratory: Positive for cough and shortness of breath. Negative for apnea, hemoptysis, sputum production, choking, chest tightness, wheezing and stridor.   Cardiovascular: Positive for palpitations. Negative for chest pain, orthopnea, leg swelling and PND.  Gastrointestinal: Positive for constipation and abdominal distention. Negative for nausea, vomiting, abdominal pain and blood in stool.  Genitourinary: Positive for urgency, frequency and decreased urine volume. Negative for dysuria, hematuria, flank pain and difficulty urinating.  Musculoskeletal: Positive for back pain. Negative for myalgias, joint swelling, arthralgias and gait problem.  Skin: Negative for color change, pallor and rash.  Neurological: Positive for weakness and headaches. Negative for dizziness, tremors, seizures, syncope, speech difficulty, light-headedness and numbness.  Hematological: Positive for environmental allergies. Negative for adenopathy. Does not bruise/bleed easily.  Psychiatric/Behavioral: Negative for confusion, sleep disturbance and  agitation. The patient is not nervous/anxious.        Objective:   Physical Exam   Filed Vitals:   09/01/11 1102  BP: 156/94  Pulse: 96  Temp: 98.2 F (36.8 C)  TempSrc: Oral  Height: 5\' 9"  (1.753 m)  Weight: 314 lb 1.3 oz (142.466 kg)  SpO2: 98%    Gen: Pleasant, well-nourished, in no distress,  normal affect  ENT: No lesions,  mouth clear,  oropharynx clear, no postnasal drip  Neck: No JVD, no TMG, no carotid bruits  Lungs: No use of accessory muscles, no dullness to percussion, exp wheeze, poor airflow  Cardiovascular: RRR, heart sounds normal, no murmur or gallops, no peripheral edema  Abdomen: soft and NT, no HSM,  BS normal  Musculoskeletal: No deformities, no cyanosis or clubbing  Neuro: alert, non focal  Skin: Warm, no lesions or rashes  10/4 CXR Cardiomegaly, peribronchial thickening    Assessment & Plan:   COPD (  chronic obstructive pulmonary disease) Asthmatic bronchitis with recent flare better Plan Cont inhaled meds as Rx No further ABX or pred Cont oxygen QHS   Hypertension HTN poorly controlled d/t poor access to meds and No pCP  Plan Pt needs pcp via [place] access Refills sent on all meds      Updated Medication List Outpatient Encounter Prescriptions as of 09/01/2011  Medication Sig Dispense Refill  . albuterol (PROVENTIL) (2.5 MG/3ML) 0.083% nebulizer solution Take 3 mLs (2.5 mg total) by nebulization 4 (four) times daily.  360 mL  6  . amLODipine (NORVASC) 10 MG tablet Take 1 tablet (10 mg total) by mouth daily.  30 tablet  6  . chlorpheniramine-carbetapentane (TUSSI-12) 4-30 MG/5ML SUSP Take by mouth 3 (three) times daily.        . Cyanocobalamin (VITAMIN B12 PO) Take by mouth as needed.        . fluticasone (FLONASE) 50 MCG/ACT nasal spray Place 2 sprays into the nose daily.  16 g  6  . furosemide (LASIX) 20 MG tablet Take 1 tablet (20 mg total) by mouth daily.  30 tablet  6  . IRON PO Take by mouth as needed.        Marland Kitchen  losartan (COZAAR) 100 MG tablet Take 1 tablet (100 mg total) by mouth daily.  30 tablet  6  . DISCONTD: amLODipine (NORVASC) 10 MG tablet Take 10 mg by mouth daily.        Marland Kitchen DISCONTD: furosemide (LASIX) 20 MG tablet Take 20 mg by mouth daily.       . beclomethasone (QVAR) 40 MCG/ACT inhaler Inhale 2 puffs into the lungs 2 (two) times daily.  1 Inhaler  0  . potassium chloride (KLOR-CON) 20 MEQ packet Take 20 mEq by mouth daily.  30 tablet  6  . DISCONTD: beclomethasone (QVAR) 80 MCG/ACT inhaler Inhale 2 puffs into the lungs 2 (two) times daily.  1 Inhaler  6  . DISCONTD: potassium chloride (KLOR-CON) 20 MEQ packet Take 20 mEq by mouth daily.        Marland Kitchen DISCONTD: predniSONE (DELTASONE) 10 MG tablet Take 4 for two days three for two days two for two days one for two days  20 tablet  0

## 2011-09-01 NOTE — Assessment & Plan Note (Signed)
Asthmatic bronchitis with recent flare better Plan Cont inhaled meds as Rx No further ABX or pred Cont oxygen QHS

## 2011-09-01 NOTE — Assessment & Plan Note (Signed)
HTN poorly controlled d/t poor access to meds and No pCP  Plan Pt needs pcp via [place] access Refills sent on all meds

## 2011-09-01 NOTE — Patient Instructions (Addendum)
Refills on amlodipine , furosemide, potassium sent to [facility] drug We will refer you to [facility] [place] Change to Qvar two puff twice daily ,  Use samples Stay on nebulizer Stay on oxygen at night Return 3 months

## 2011-10-09 ENCOUNTER — Telehealth: Payer: Self-pay | Admitting: Critical Care Medicine

## 2011-10-09 DIAGNOSIS — J449 Chronic obstructive pulmonary disease, unspecified: Secondary | ICD-10-CM

## 2011-10-09 MED ORDER — ALBUTEROL SULFATE (2.5 MG/3ML) 0.083% IN NEBU: 2.5000 mg | INHALATION_SOLUTION | Freq: Four times a day (QID) | RESPIRATORY_TRACT | Status: DC

## 2011-10-09 NOTE — Telephone Encounter (Signed)
Refill sent to the pharmacy pt aware  

## 2011-10-30 ENCOUNTER — Encounter: Payer: Self-pay | Admitting: Critical Care Medicine

## 2011-10-30 ENCOUNTER — Ambulatory Visit (INDEPENDENT_AMBULATORY_CARE_PROVIDER_SITE_OTHER): Payer: Medicaid Other | Admitting: Critical Care Medicine

## 2011-10-30 ENCOUNTER — Ambulatory Visit (HOSPITAL_BASED_OUTPATIENT_CLINIC_OR_DEPARTMENT_OTHER)
Admission: RE | Admit: 2011-10-30 | Discharge: 2011-10-30 | Disposition: A | Discharge status: Home / Self Care | Payer: Medicaid Other | Source: Ambulatory Visit | Attending: Critical Care Medicine | Admitting: Critical Care Medicine

## 2011-10-30 VITALS — BP 130/82 | HR 106 | Temp 98.8°F | Ht 69.0 in | Wt 312.0 lb

## 2011-10-30 DIAGNOSIS — J449 Chronic obstructive pulmonary disease, unspecified: Secondary | ICD-10-CM

## 2011-10-30 DIAGNOSIS — R0609 Other forms of dyspnea: Secondary | ICD-10-CM

## 2011-10-30 DIAGNOSIS — R0989 Other specified symptoms and signs involving the circulatory and respiratory systems: Secondary | ICD-10-CM | POA: Insufficient documentation

## 2011-10-30 DIAGNOSIS — I10 Essential (primary) hypertension: Secondary | ICD-10-CM

## 2011-10-30 DIAGNOSIS — R0600 Dyspnea, unspecified: Secondary | ICD-10-CM

## 2011-10-30 DIAGNOSIS — J4489 Other specified chronic obstructive pulmonary disease: Secondary | ICD-10-CM | POA: Insufficient documentation

## 2011-10-30 DIAGNOSIS — I517 Cardiomegaly: Secondary | ICD-10-CM | POA: Insufficient documentation

## 2011-10-30 NOTE — Progress Notes (Signed)
Subjective:    Patient ID: Kathryn Mahoney, female    DOB: 09/26/1961, 51 y.o.   MRN: [id number]  HPI  10/30/11  Pt notes since last ov if stands a lot is dyspneic.  Back hurts as well.  No real cough.  Occ wheeze at night.  No heart burn.  No real mucus.  Uses oxygen at night.,   Still has not found a PCP in [place] [facility]. Pt denies any significant sore throat, nasal congestion or excess secretions, fever, chills, sweats, unintended weight loss, pleurtic or exertional chest pain, orthopnea PND, or leg swelling Pt denies any increase in rescue therapy over baseline, denies waking up needing it or having any early am or nocturnal exacerbations of coughing/wheezing/or dyspnea. Pt also denies any obvious fluctuation in symptoms with  weather or environmental change or other alleviating or aggravating factors    Past Medical History  Diagnosis Date  . COPD (chronic obstructive pulmonary disease)   . CHF (congestive heart failure)   . SOB (shortness of breath)   . Urinary incontinence   . Asthma   . Hypertension      Family History  Problem Relation Age of Onset  . Stroke Mother   . Asthma Mother      History   Social History  . Marital Status: Single    Spouse Name: N/A    Number of Children: 4  . Years of Education: N/A   Occupational History  . Not on file.   Social History Main Topics  . Smoking status: Former Smoker -- 0.8 packs/day for 30 years    Types: Cigarettes    Quit date: 10/27/2010  . Smokeless tobacco: Never Used  . Alcohol Use: No  . Drug Use: No  . Sexually Active: Not on file   Other Topics Concern  . Not on file   Social History Narrative  . No narrative on file     No Known Allergies   Outpatient Prescriptions Prior to Visit  Medication Sig Dispense Refill  . albuterol (PROVENTIL) (2.5 MG/3ML) 0.083% nebulizer solution Take 3 mLs (2.5 mg total) by nebulization 4 (four) times daily.  360 mL  6  . amLODipine (NORVASC) 10 MG tablet Take 1  tablet (10 mg total) by mouth daily.  30 tablet  6  . beclomethasone (QVAR) 40 MCG/ACT inhaler Inhale 2 puffs into the lungs 2 (two) times daily.  1 Inhaler  0  . Cyanocobalamin (VITAMIN B12 PO) Take by mouth as needed.        . furosemide (LASIX) 20 MG tablet Take 1 tablet (20 mg total) by mouth daily.  30 tablet  6  . losartan (COZAAR) 100 MG tablet Take 1 tablet (100 mg total) by mouth daily.  30 tablet  6  . potassium chloride (KLOR-CON) 20 MEQ packet Take 20 mEq by mouth daily.  30 tablet  6  . fluticasone (FLONASE) 50 MCG/ACT nasal spray Place 2 sprays into the nose daily.  16 g  6  . chlorpheniramine-carbetapentane (TUSSI-12) 4-30 MG/5ML SUSP Take by mouth 3 (three) times daily.        . IRON PO Take by mouth as needed.             Review of Systems  Constitutional: Positive for fatigue and unexpected weight change. Negative for diaphoresis, activity change and appetite change.  HENT: Negative for hearing loss, nosebleeds, congestion, facial swelling, sneezing, mouth sores, trouble swallowing, neck stiffness, dental problem, voice change, sinus pressure, tinnitus and  ear discharge.   Eyes: Negative for photophobia, discharge, itching and visual disturbance.  Respiratory: Negative for apnea, choking, chest tightness and stridor.   Cardiovascular: Negative for palpitations.  Gastrointestinal: Positive for constipation. Negative for nausea, blood in stool and abdominal distention.  Genitourinary: Negative for dysuria, urgency, frequency, hematuria, flank pain, decreased urine volume and difficulty urinating.  Musculoskeletal: Positive for back pain. Negative for joint swelling, arthralgias and gait problem.  Skin: Negative for color change and pallor.  Neurological: Positive for weakness. Negative for dizziness, tremors, seizures, syncope, speech difficulty, light-headedness and numbness.  Hematological: Negative for adenopathy. Does not bruise/bleed easily.  Psychiatric/Behavioral:  Negative for confusion, sleep disturbance and agitation. The patient is not nervous/anxious.        Objective:   Physical Exam   Filed Vitals:   10/30/11 1108  BP: 130/82  Pulse: 106  Temp: 98.8 F (37.1 C)  TempSrc: Oral  Height: 5\' 9"  (1.753 m)  Weight: 312 lb (141.522 kg)  SpO2: 98%    Gen: Pleasant, well-nourished, in no distress,  normal affect  ENT: No lesions,  mouth clear,  oropharynx clear, no postnasal drip  Neck: No JVD, no TMG, no carotid bruits  Lungs: No use of accessory muscles, no dullness to percussion, no wheezes, clear Cardiovascular: RRR, heart sounds normal, no murmur or gallops, no peripheral edema  Abdomen: soft and NT, no HSM,  BS normal  Musculoskeletal: No deformities, no cyanosis or clubbing  Neuro: alert, non focal  Skin: Warm, no lesions or rashes Dg Chest 2 View  10/30/2011  *RADIOLOGY REPORT*  Clinical Data: Dyspnea.  COPD.  Hypertension.  CHEST - 2 VIEW  Comparison: 08/02/2011  Findings: Moderate cardiomegaly is stable as well as pulmonary venous hypertension.  No evidence of acute infiltrate or pulmonary edema.  No evidence of pleural effusion.  No mass or lymphadenopathy identified.  IMPRESSION: Stable cardiomegaly and pulmonary venous hypertension.  No active disease.  Original Report Authenticated By: Danae Orleans, M.D.     Assessment & Plan:   COPD (chronic obstructive pulmonary disease) Asthmatic bronchitis with recent flare better Note CXR clear 10/29/10 Plan Cont inhaled meds as Rx No further ABX or pred Cont oxygen QHS     Hypertension HTN better control with meds Needs PCP MD     Updated Medication List Outpatient Encounter Prescriptions as of 10/30/2011  Medication Sig Dispense Refill  . albuterol (PROVENTIL) (2.5 MG/3ML) 0.083% nebulizer solution Take 3 mLs (2.5 mg total) by nebulization 4 (four) times daily.  360 mL  6  . amLODipine (NORVASC) 10 MG tablet Take 1 tablet (10 mg total) by mouth daily.  30 tablet  6    . beclomethasone (QVAR) 40 MCG/ACT inhaler Inhale 2 puffs into the lungs 2 (two) times daily.  1 Inhaler  0  . Cyanocobalamin (VITAMIN B12 PO) Take by mouth as needed.        . fluticasone (FLONASE) 50 MCG/ACT nasal spray Place 2 sprays into the nose daily as needed.        . furosemide (LASIX) 20 MG tablet Take 1 tablet (20 mg total) by mouth daily.  30 tablet  6  . losartan (COZAAR) 100 MG tablet Take 1 tablet (100 mg total) by mouth daily.  30 tablet  6  . potassium chloride (KLOR-CON) 20 MEQ packet Take 20 mEq by mouth daily.  30 tablet  6  . DISCONTD: fluticasone (FLONASE) 50 MCG/ACT nasal spray Place 2 sprays into the nose daily.  16 g  6  . DISCONTD: chlorpheniramine-carbetapentane (TUSSI-12) 4-30 MG/5ML SUSP Take by mouth 3 (three) times daily.        Marland Kitchen DISCONTD: IRON PO Take by mouth as needed.

## 2011-10-30 NOTE — Progress Notes (Signed)
Quick Note:  Notify the patient that the Xray is stable and no pneumonia, no other acute process. Nothing in lungs to cause the back pain. No change in medications are recommended. Continue current meds as prescribed at last office visit ______

## 2011-10-30 NOTE — Patient Instructions (Signed)
Stay on Qvar as prescribed Obtain a chest xray today I will ask for records from [facility] regional  Return 4 months  Again, keep trying to get a primary care physician.

## 2011-10-31 NOTE — Assessment & Plan Note (Signed)
HTN better control with meds Needs PCP MD

## 2011-10-31 NOTE — Assessment & Plan Note (Addendum)
Asthmatic bronchitis with recent flare better Note CXR clear 10/29/10 Plan Cont inhaled meds as Rx No further ABX or pred Cont oxygen QHS

## 2011-12-01 ENCOUNTER — Inpatient Hospital Stay (HOSPITAL_COMMUNITY)
Admission: AD | Admit: 2011-12-01 | Discharge: 2011-12-01 | Disposition: A | Discharge status: Home / Self Care | Payer: Medicaid Other | Source: Ambulatory Visit | Attending: Family Medicine | Admitting: Family Medicine

## 2011-12-01 ENCOUNTER — Encounter (HOSPITAL_COMMUNITY): Payer: Self-pay | Admitting: *Deleted

## 2011-12-01 DIAGNOSIS — M545 Low back pain, unspecified: Secondary | ICD-10-CM | POA: Insufficient documentation

## 2011-12-01 DIAGNOSIS — M549 Dorsalgia, unspecified: Secondary | ICD-10-CM

## 2011-12-01 DIAGNOSIS — N760 Acute vaginitis: Secondary | ICD-10-CM

## 2011-12-01 DIAGNOSIS — N949 Unspecified condition associated with female genital organs and menstrual cycle: Secondary | ICD-10-CM | POA: Insufficient documentation

## 2011-12-01 LAB — URINALYSIS, ROUTINE W REFLEX MICROSCOPIC
Bilirubin Urine: NEGATIVE
Glucose, UA: NEGATIVE mg/dL
Ketones, ur: NEGATIVE mg/dL
Leukocytes, UA: NEGATIVE
Nitrite: NEGATIVE
Protein, ur: NEGATIVE mg/dL
Specific Gravity, Urine: 1.025 (ref 1.005–1.030)
Urobilinogen, UA: 0.2 mg/dL (ref 0.0–1.0)
pH: 6 (ref 5.0–8.0)

## 2011-12-01 LAB — WET PREP, GENITAL

## 2011-12-01 LAB — URINE MICROSCOPIC-ADD ON

## 2011-12-01 MED ORDER — METRONIDAZOLE 500 MG PO TABS: 500.0000 mg | ORAL_TABLET | Freq: Two times a day (BID) | ORAL | Status: AC

## 2011-12-01 MED ORDER — FLUCONAZOLE 150 MG PO TABS: 150.0000 mg | ORAL_TABLET | Freq: Once | ORAL | Status: AC

## 2011-12-01 NOTE — ED Provider Notes (Signed)
Chart reviewed and agree with management and plan.  

## 2011-12-01 NOTE — ED Provider Notes (Signed)
History   Pt presents today c/o low back pain for the past 3 months and a vag dc with odor. She denies vag bleeding, fever.  Chief Complaint  Patient presents with  . Back Pain   HPI  OB History    Grav Para Term Preterm Abortions TAB SAB Ect Mult Living   5 4 4  1   1  4       Past Medical History  Diagnosis Date  . COPD (chronic obstructive pulmonary disease)   . CHF (congestive heart failure)   . SOB (shortness of breath)   . Urinary incontinence   . Asthma   . Hypertension     Past Surgical History  Procedure Date  . Cesarean section   . Ectopic pregnancy surgery   . Knee surgery     Family History  Problem Relation Age of Onset  . Stroke Mother   . Asthma Mother     History  Substance Use Topics  . Smoking status: Former Smoker -- 0.8 packs/day for 30 years    Types: Cigarettes    Quit date: 10/27/2010  . Smokeless tobacco: Never Used  . Alcohol Use: No    Allergies: No Known Allergies  Prescriptions prior to admission  Medication Sig Dispense Refill  . albuterol (PROVENTIL) (2.5 MG/3ML) 0.083% nebulizer solution Take 2.5 mg by nebulization 2 (two) times daily.      Marland Kitchen amLODipine (NORVASC) 10 MG tablet Take 10 mg by mouth daily.      . beclomethasone (QVAR) 40 MCG/ACT inhaler Inhale 2 puffs into the lungs 2 (two) times daily.      . furosemide (LASIX) 20 MG tablet Take 20 mg by mouth daily.      Marland Kitchen losartan (COZAAR) 100 MG tablet Take 100 mg by mouth daily.      . potassium chloride (KLOR-CON) 20 MEQ packet Take 20 mEq by mouth daily.      Marland Kitchen DISCONTD: albuterol (PROVENTIL) (2.5 MG/3ML) 0.083% nebulizer solution Take 3 mLs (2.5 mg total) by nebulization 4 (four) times daily.  360 mL  6  . DISCONTD: amLODipine (NORVASC) 10 MG tablet Take 1 tablet (10 mg total) by mouth daily.  30 tablet  6  . DISCONTD: beclomethasone (QVAR) 40 MCG/ACT inhaler Inhale 2 puffs into the lungs 2 (two) times daily.  1 Inhaler  0  . DISCONTD: furosemide (LASIX) 20 MG tablet Take 1  tablet (20 mg total) by mouth daily.  30 tablet  6  . DISCONTD: losartan (COZAAR) 100 MG tablet Take 1 tablet (100 mg total) by mouth daily.  30 tablet  6  . DISCONTD: potassium chloride (KLOR-CON) 20 MEQ packet Take 20 mEq by mouth daily.  30 tablet  6    Review of Systems  Constitutional: Negative for fever.  Eyes: Negative for blurred vision and double vision.  Respiratory: Negative for cough.   Cardiovascular: Negative for chest pain and palpitations.  Gastrointestinal: Negative for nausea, vomiting, abdominal pain, diarrhea and constipation.  Genitourinary: Negative for dysuria, urgency, frequency and hematuria.  Musculoskeletal: Positive for back pain.  Neurological: Negative for dizziness and headaches.  Psychiatric/Behavioral: Negative for depression and suicidal ideas.   Physical Exam   Blood pressure 145/78, pulse 101, temperature 98.6 F (37 C), temperature source Oral, resp. rate 20, height 5\' 7"  (1.702 m), weight 320 lb 3.2 oz (145.242 kg), SpO2 95.00%.  Physical Exam  Nursing note and vitals reviewed. Constitutional: She is oriented to person, place, and time. She appears well-developed  and well-nourished. No distress.  HENT:  Head: Normocephalic and atraumatic.  Eyes: EOM are normal. Pupils are equal, round, and reactive to light.  GI: Soft. She exhibits no distension and no mass. There is no tenderness. There is no rebound and no guarding.  Genitourinary: No bleeding around the vagina. Vaginal discharge found.       Uterus difficult to palpate secondary to morbid obesity. Pt non-tender on exam. No palpable adnexal masses.  Neurological: She is alert and oriented to person, place, and time.  Skin: Skin is warm and dry. She is not diaphoretic.  Psychiatric: She has a normal mood and affect. Her behavior is normal. Judgment and thought content normal.    MAU Course  Procedures  Wet prep and GC/Chlamydia cultures done.  Results for orders placed during the hospital  encounter of 12/01/11 (from the past 72 hour(s))  URINALYSIS, ROUTINE W REFLEX MICROSCOPIC     Status: Abnormal   Collection Time   12/01/11 10:30 AM      Component Value Range Comment   Color, Urine YELLOW  YELLOW     APPearance CLEAR  CLEAR     Specific Gravity, Urine 1.025  1.005 - 1.030     pH 6.0  5.0 - 8.0     Glucose, UA NEGATIVE  NEGATIVE (mg/dL)    Hgb urine dipstick TRACE (*) NEGATIVE     Bilirubin Urine NEGATIVE  NEGATIVE     Ketones, ur NEGATIVE  NEGATIVE (mg/dL)    Protein, ur NEGATIVE  NEGATIVE (mg/dL)    Urobilinogen, UA 0.2  0.0 - 1.0 (mg/dL)    Nitrite NEGATIVE  NEGATIVE     Leukocytes, UA NEGATIVE  NEGATIVE    URINE MICROSCOPIC-ADD ON     Status: Abnormal   Collection Time   12/01/11 10:30 AM      Component Value Range Comment   Squamous Epithelial / LPF FEW (*) RARE     WBC, UA 0-2  <3 (WBC/hpf)    RBC / HPF 0-2  <3 (RBC/hpf)    Bacteria, UA FEW (*) RARE     Urine-Other MUCOUS PRESENT     WET PREP, GENITAL     Status: Abnormal   Collection Time   12/01/11 11:15 AM      Component Value Range Comment   Yeast Wet Prep HPF POC NONE SEEN  NONE SEEN     Trich, Wet Prep NONE SEEN  NONE SEEN     Clue Cells Wet Prep HPF POC NONE SEEN  NONE SEEN     WBC, Wet Prep HPF POC FEW (*) NONE SEEN  MANY BACTERIA SEEN     Assessment and Plan  Vaginal odor: will prophylactically tx with Flagyl. Warned of antabuse reaction.  Back Pain: pt with musculoskeletal strain. Discussed diet, activity, risks, and precautions.   Clinton Gallant. Rice III, DrHSc, MPAS, PA-C  12/01/2011, 11:21 AM   Henrietta Hoover, PA 12/01/11 1144

## 2011-12-01 NOTE — Progress Notes (Signed)
Patient states she has been having low back pain off and on for about 3 months. Patient states she has COPD and CHF  has been on steroids for long periods of time(not currently). Has had a UTI and she feels she was not completely treated. Has had a vaginal discharge with an odor for about one month.

## 2012-01-20 ENCOUNTER — Telehealth: Payer: Self-pay | Admitting: Critical Care Medicine

## 2012-01-20 MED ORDER — ALBUTEROL SULFATE (2.5 MG/3ML) 0.083% IN NEBU: 2.5000 mg | INHALATION_SOLUTION | Freq: Two times a day (BID) | RESPIRATORY_TRACT | Status: DC

## 2012-01-20 NOTE — Telephone Encounter (Signed)
RX sent electronically to [facility] on [address] Unable to reach pt to notify her of this. Only heard a strange busy signal.

## 2012-01-22 ENCOUNTER — Telehealth: Payer: Self-pay | Admitting: Critical Care Medicine

## 2012-01-22 MED ORDER — ALBUTEROL SULFATE (2.5 MG/3ML) 0.083% IN NEBU: 2.5000 mg | INHALATION_SOLUTION | Freq: Two times a day (BID) | RESPIRATORY_TRACT | Status: DC

## 2012-01-22 NOTE — Telephone Encounter (Signed)
Spoke with pt and she states pharmacy did not receive the rx we sent for albuterol nebs. I phoned to pharmacy, pt aware and states nothing further needed.

## 2012-05-11 ENCOUNTER — Emergency Department (HOSPITAL_COMMUNITY): Payer: Medicaid Other

## 2012-05-11 ENCOUNTER — Telehealth: Payer: Self-pay | Admitting: Critical Care Medicine

## 2012-05-11 ENCOUNTER — Encounter (HOSPITAL_COMMUNITY): Payer: Self-pay | Admitting: *Deleted

## 2012-05-11 ENCOUNTER — Emergency Department (HOSPITAL_COMMUNITY)
Admission: EM | Admit: 2012-05-11 | Discharge: 2012-05-11 | Disposition: A | Discharge status: Home / Self Care | Payer: Medicaid Other | Attending: Emergency Medicine | Admitting: Emergency Medicine

## 2012-05-11 DIAGNOSIS — M25519 Pain in unspecified shoulder: Secondary | ICD-10-CM | POA: Insufficient documentation

## 2012-05-11 DIAGNOSIS — W108XXA Fall (on) (from) other stairs and steps, initial encounter: Secondary | ICD-10-CM | POA: Insufficient documentation

## 2012-05-11 MED ORDER — HYDROCODONE-ACETAMINOPHEN 5-325 MG PO TABS: ORAL_TABLET | ORAL | Status: AC

## 2012-05-11 MED ORDER — BECLOMETHASONE DIPROPIONATE 40 MCG/ACT IN AERS: 2.0000 | INHALATION_SPRAY | Freq: Two times a day (BID) | RESPIRATORY_TRACT | Status: DC

## 2012-05-11 MED ORDER — ALBUTEROL SULFATE (2.5 MG/3ML) 0.083% IN NEBU: 2.5000 mg | INHALATION_SOLUTION | Freq: Two times a day (BID) | RESPIRATORY_TRACT | Status: DC

## 2012-05-11 NOTE — ED Notes (Signed)
Pt refusing to wait for x-ray. PA informed and speaking to patient.

## 2012-05-11 NOTE — ED Provider Notes (Signed)
History     CSN: [id number]  Arrival date & time 05/11/12  1052   First MD Initiated Contact with Patient 05/11/12 1127      Chief Complaint  Patient presents with  . Arm Pain    (Consider location/radiation/quality/duration/timing/severity/associated sxs/prior treatment) HPI Comments: Patient presents with complaint of right upper arm and shoulder pain that she sustained 2-3 days ago after she slipped and fell on some stairs. Patient states that she fell onto that part of her arm and shoulder. Since that time she has had trouble lifting her R arm. She denies tingling in her arm or hand. She denies weakness in her arm or hand. No treatments prior to arrival. Movement makes the pain worse. Nothing makes it better. Onset acute. Course is constant.  Patient is a 51 y.o. female presenting with shoulder pain. The history is provided by the patient.  Shoulder Pain This is a new problem. The current episode started in the past 7 days. The problem occurs constantly. The problem has been unchanged. Associated symptoms include arthralgias. Pertinent negatives include no joint swelling, neck pain, numbness or weakness. The symptoms are aggravated by bending. She has tried nothing for the symptoms. The treatment provided no relief.    Past Medical History  Diagnosis Date  . COPD (chronic obstructive pulmonary disease)   . CHF (congestive heart failure)   . SOB (shortness of breath)   . Urinary incontinence   . Asthma   . Hypertension     Past Surgical History  Procedure Date  . Cesarean section   . Ectopic pregnancy surgery   . Knee surgery     Family History  Problem Relation Age of Onset  . Stroke Mother   . Asthma Mother     History  Substance Use Topics  . Smoking status: Former Smoker -- 0.8 packs/day for 30 years    Types: Cigarettes    Quit date: 10/27/2010  . Smokeless tobacco: Never Used  . Alcohol Use: No    OB History    Grav Para Term Preterm Abortions TAB SAB  Ect Mult Living   5 4 4  1   1  4       Review of Systems  Constitutional: Negative for activity change.  HENT: Negative for neck pain.   Musculoskeletal: Positive for arthralgias. Negative for back pain and joint swelling.  Skin: Negative for wound.  Neurological: Negative for weakness and numbness.    Allergies  Review of patient's allergies indicates no known allergies.  Home Medications   Current Outpatient Rx  Name Route Sig Dispense Refill  . ALBUTEROL SULFATE (2.5 MG/3ML) 0.083% IN NEBU Nebulization Take 2.5 mg by nebulization 2 (two) times daily. DX:  496    . BECLOMETHASONE DIPROPIONATE 40 MCG/ACT IN AERS Inhalation Inhale 2 puffs into the lungs 2 (two) times daily.    . FUROSEMIDE 20 MG PO TABS Oral Take 20 mg by mouth daily.    Marland Kitchen HYDROCHLOROTHIAZIDE 25 MG PO TABS Oral Take 25 mg by mouth daily.    Marland Kitchen METOPROLOL TARTRATE 50 MG PO TABS Oral Take 50 mg by mouth 2 (two) times daily.    . TOPIRAMATE 100 MG PO TABS Oral Take 200 mg by mouth at bedtime.      BP 152/91  Pulse 84  Temp 98.6 F (37 C) (Oral)  Resp 18  SpO2 93%  Physical Exam  Nursing note and vitals reviewed. Constitutional: She appears well-developed and well-nourished.  HENT:  Head: Normocephalic  and atraumatic.  Eyes: Pupils are equal, round, and reactive to light.  Neck: Normal range of motion. Neck supple.  Cardiovascular: Exam reveals no decreased pulses.   Pulses:      Radial pulses are 2+ on the right side, and 2+ on the left side.  Musculoskeletal: She exhibits tenderness. She exhibits no edema.       Right shoulder: She exhibits decreased range of motion and tenderness. She exhibits no bony tenderness and no swelling.       Right elbow: Normal.She exhibits normal range of motion and no swelling.       Cervical back: She exhibits normal range of motion, no tenderness and no bony tenderness.       Thoracic back: She exhibits normal range of motion, no tenderness and no bony tenderness.        Arms: Neurological: She is alert. No sensory deficit.       Motor, sensation, and vascular distal to the injury is fully intact.   Skin: Skin is warm and dry.  Psychiatric: She has a normal mood and affect.    ED Course  Procedures (including critical care time)  Labs Reviewed - No data to display No results found.   1. Shoulder pain     12:10 PM Patient seen and examined. Work-up initiated.   Vital signs reviewed and are as follows: Filed Vitals:   05/11/12 1111  BP: 152/91  Pulse: 84  Temp: 98.6 F (37 C)  Resp: 18   Patient given sling by ortho tech. She decided that she does not want to wait for x-ray and elects conservative treatment. Urged ortho f/u if no improvement in one week.   Patient was counseled on RICE protocol and told to rest injury, use ice for no longer than 15 minutes every hour, compress the area, and elevate above the level of their heart as much as possible to reduce swelling.  Questions answered.  Patient verbalized understanding.    Patient counseled on use of narcotic pain medications. Counseled not to combine these medications with others containing tylenol. Urged not to drink alcohol, drive, or perform any other activities that requires focus while taking these medications. The patient verbalizes understanding and agrees with the plan.     MDM  Shoulder pain, suspect sprain. RICE and conservative management indicated. Patient refuses x-ray to r/o fracture. R UE is neurovascularly intact. Ortho f/u given.         Renne Crigler, [place] 05/11/12 2157

## 2012-05-11 NOTE — Telephone Encounter (Signed)
Patient aware Rx's sent.

## 2012-05-11 NOTE — ED Notes (Signed)
Pt reports she fell three days ago when she slipped on the steps and fell down 4 steps. Pt reports she caught herself with the right arm and now has pain to upper right arm and shoulder. Pt reports she [place] unable to lift arm.  Pt states pain is worse with movement and rates pain 7/10.  Pt has taken ibuprofen for pain.  Pt denies previous injury to arm.

## 2012-05-12 NOTE — ED Provider Notes (Signed)
Medical screening examination/treatment/procedure(s) were performed by non-physician practitioner and as supervising physician I was immediately available for consultation/collaboration.   Lyanne Co, MD 05/12/12 2138

## 2012-06-30 ENCOUNTER — Emergency Department (HOSPITAL_COMMUNITY)
Admission: EM | Admit: 2012-06-30 | Discharge: 2012-07-01 | Disposition: A | Discharge status: Home / Self Care | Payer: Medicaid Other | Attending: Emergency Medicine | Admitting: Emergency Medicine

## 2012-06-30 ENCOUNTER — Encounter (HOSPITAL_COMMUNITY): Payer: Self-pay | Admitting: Emergency Medicine

## 2012-06-30 DIAGNOSIS — M25519 Pain in unspecified shoulder: Secondary | ICD-10-CM

## 2012-06-30 DIAGNOSIS — J449 Chronic obstructive pulmonary disease, unspecified: Secondary | ICD-10-CM | POA: Insufficient documentation

## 2012-06-30 DIAGNOSIS — Z87891 Personal history of nicotine dependence: Secondary | ICD-10-CM | POA: Insufficient documentation

## 2012-06-30 DIAGNOSIS — I509 Heart failure, unspecified: Secondary | ICD-10-CM | POA: Insufficient documentation

## 2012-06-30 DIAGNOSIS — I10 Essential (primary) hypertension: Secondary | ICD-10-CM | POA: Insufficient documentation

## 2012-06-30 DIAGNOSIS — J4489 Other specified chronic obstructive pulmonary disease: Secondary | ICD-10-CM | POA: Insufficient documentation

## 2012-06-30 NOTE — ED Notes (Signed)
Pt alert, nad, arrives from home, c/o right shoulder pain, onset several months ago after fall injury, denies recent trauma, resp even unlabored, skin pwd

## 2012-07-01 ENCOUNTER — Emergency Department (HOSPITAL_COMMUNITY): Payer: Medicaid Other

## 2012-07-01 MED ORDER — OXYCODONE-ACETAMINOPHEN 5-325 MG PO TABS: 1.0000 | ORAL_TABLET | ORAL | Status: AC | PRN

## 2012-07-01 MED ORDER — OXYCODONE-ACETAMINOPHEN 5-325 MG PO TABS
2.0000 | ORAL_TABLET | Freq: Once | ORAL | Status: AC
  Administered 2012-07-01: 2 via ORAL
  Filled 2012-07-01: qty 2

## 2012-07-01 NOTE — ED Notes (Signed)
Pt began taking Losartan x 1 year ago and sts that she has never felt good on her this medication. Sts she is feeling nauseous, weak, swollen, and shob. Patient sts she also fell on her right arm x 1 month ago and that she is still experiencing pain from that injury.

## 2012-07-02 ENCOUNTER — Ambulatory Visit: Payer: Medicaid Other | Admitting: Critical Care Medicine

## 2012-07-02 ENCOUNTER — Encounter: Payer: Self-pay | Admitting: Critical Care Medicine

## 2012-07-02 ENCOUNTER — Ambulatory Visit (INDEPENDENT_AMBULATORY_CARE_PROVIDER_SITE_OTHER): Payer: Medicaid Other | Admitting: Critical Care Medicine

## 2012-07-02 VITALS — BP 156/90 | HR 94 | Temp 98.1°F | Ht 69.0 in | Wt 313.8 lb

## 2012-07-02 DIAGNOSIS — I10 Essential (primary) hypertension: Secondary | ICD-10-CM

## 2012-07-02 DIAGNOSIS — J4489 Other specified chronic obstructive pulmonary disease: Secondary | ICD-10-CM

## 2012-07-02 DIAGNOSIS — Z23 Encounter for immunization: Secondary | ICD-10-CM

## 2012-07-02 DIAGNOSIS — J449 Chronic obstructive pulmonary disease, unspecified: Secondary | ICD-10-CM

## 2012-07-02 MED ORDER — BECLOMETHASONE DIPROPIONATE 40 MCG/ACT IN AERS: 2.0000 | INHALATION_SPRAY | Freq: Two times a day (BID) | RESPIRATORY_TRACT | Status: DC

## 2012-07-02 MED ORDER — HYDROCHLOROTHIAZIDE 25 MG PO TABS: 25.0000 mg | ORAL_TABLET | Freq: Every day | ORAL | Status: DC

## 2012-07-02 MED ORDER — ALBUTEROL SULFATE (2.5 MG/3ML) 0.083% IN NEBU: 2.5000 mg | INHALATION_SOLUTION | Freq: Two times a day (BID) | RESPIRATORY_TRACT | Status: DC

## 2012-07-02 MED ORDER — FLUTICASONE PROPIONATE 50 MCG/ACT NA SUSP: 2.0000 | Freq: Every day | NASAL | Status: DC

## 2012-07-02 MED ORDER — TOPIRAMATE 100 MG PO TABS: 200.0000 mg | ORAL_TABLET | Freq: Every day | ORAL | Status: DC

## 2012-07-02 NOTE — Patient Instructions (Addendum)
Take HCTZ daily Refills on all meds sent Stop losartan Follow instructions to get a PCP MD  Stay on flonase/nasonex two puff each nostril daily Stay on nebulizer and Qvar Flu vaccine and pneumovax today Return  4 months [place] office

## 2012-07-02 NOTE — Assessment & Plan Note (Signed)
Only needs HCTZ 25mg  /d for now Refill meds until can see PCP

## 2012-07-02 NOTE — Assessment & Plan Note (Addendum)
Stable Gold C copd oxygen dependent at night Plan Cont Qvar and albuterol neb med Cont qhs oxygen Flu vaccine and pneumovax

## 2012-07-02 NOTE — Progress Notes (Signed)
Subjective:    Patient ID: Kathryn Mahoney, female    DOB: 04-14-1961, 51 y.o.   MRN: [id number]  HPI  51 y.o. AAF  Copd Golds C   10/30/11 Pt notes since last ov if stands a lot is dyspneic.  Back hurts as well.  No real cough.  Occ wheeze at night.  No heart burn.  No real mucus.  Uses oxygen at night.,   Still has not found a PCP in [place]. Pt denies any significant sore throat, nasal congestion or excess secretions, fever, chills, sweats, unintended weight loss, pleurtic or exertional chest pain, orthopnea PND, or leg swelling Pt denies any increase in rescue therapy over baseline, denies waking up needing it or having any early am or nocturnal exacerbations of coughing/wheezing/or dyspnea. Pt also denies any obvious fluctuation in symptoms with  weather or environmental change or other alleviating or aggravating factors  07/02/2012 Not seen since 10/2011.  Just in ED 06/30/12 due to shoulder injury with a fall. Not able to get a PCP.    Sees an MD at [facility].  Weight issue. Pt uses neb med. QVar.  Ran out recently Breathing seems worse since ran out.  On oxygen 2Liters.   Losartan side effects.  Past Medical History  Diagnosis Date  . COPD (chronic obstructive pulmonary disease)   . CHF (congestive heart failure)   . SOB (shortness of breath)   . Urinary incontinence   . Asthma   . Hypertension      Family History  Problem Relation Age of Onset  . Stroke Mother   . Asthma Mother      History   Social History  . Marital Status: Single    Spouse Name: N/A    Number of Children: 4  . Years of Education: N/A   Occupational History  . Not on file.   Social History Main Topics  . Smoking status: Former Smoker -- 1.5 packs/day for 30 years    Types: Cigarettes    Quit date: 10/27/2010  . Smokeless tobacco: Never Used  . Alcohol Use: No  . Drug Use: No  . Sexually Active: Not Currently   Other Topics Concern  . Not on file   Social History Narrative  . No  narrative on file     No Known Allergies   Outpatient Prescriptions Prior to Visit  Medication Sig Dispense Refill  . oxyCODONE-acetaminophen (PERCOCET/ROXICET) 5-325 MG per tablet Take 1 tablet by mouth every 4 (four) hours as needed for pain.  12 tablet  0  . albuterol (PROVENTIL) (2.5 MG/3ML) 0.083% nebulizer solution Take 3 mLs (2.5 mg total) by nebulization 2 (two) times daily. DX:  496  75 mL  0  . beclomethasone (QVAR) 40 MCG/ACT inhaler Inhale 2 puffs into the lungs 2 (two) times daily.  1 Inhaler  0  . furosemide (LASIX) 20 MG tablet Take 20 mg by mouth daily.      . hydrochlorothiazide (HYDRODIURIL) 25 MG tablet Take 25 mg by mouth daily.      Marland Kitchen losartan (COZAAR) 100 MG tablet Take 100 mg by mouth daily.      Marland Kitchen topiramate (TOPAMAX) 100 MG tablet Take 200 mg by mouth at bedtime.           Review of Systems  Constitutional: Positive for fatigue and unexpected weight change. Negative for diaphoresis, activity change and appetite change.  HENT: Negative for hearing loss, nosebleeds, congestion, facial swelling, sneezing, mouth sores, trouble swallowing, neck  stiffness, dental problem, voice change, sinus pressure, tinnitus and ear discharge.   Eyes: Negative for photophobia, discharge, itching and visual disturbance.  Respiratory: Negative for apnea, choking, chest tightness and stridor.   Cardiovascular: Negative for palpitations.  Gastrointestinal: Positive for constipation. Negative for nausea, blood in stool and abdominal distention.  Genitourinary: Negative for dysuria, urgency, frequency, hematuria, flank pain, decreased urine volume and difficulty urinating.  Musculoskeletal: Positive for back pain. Negative for joint swelling, arthralgias and gait problem.  Skin: Negative for color change and pallor.  Neurological: Positive for weakness. Negative for dizziness, tremors, seizures, syncope, speech difficulty, light-headedness and numbness.  Hematological: Negative for  adenopathy. Does not bruise/bleed easily.  Psychiatric/Behavioral: Negative for confusion, disturbed wake/sleep cycle and agitation. The patient is not nervous/anxious.        Objective:   Physical Exam   Filed Vitals:   07/02/12 1121  BP: 156/90  Pulse: 94  Temp: 98.1 F (36.7 C)  TempSrc: Oral  Height: 5\' 9"  (1.753 m)  Weight: 142.339 kg (313 lb 12.8 oz)  SpO2: 95%    Gen: Pleasant, well-nourished, in no distress,  normal affect  ENT: No lesions,  mouth clear,  oropharynx clear, no postnasal drip  Neck: No JVD, no TMG, no carotid bruits  Lungs: No use of accessory muscles, no dullness to percussion, no wheezes, clear Cardiovascular: RRR, heart sounds normal, no murmur or gallops, no peripheral edema  Abdomen: soft and NT, no HSM,  BS normal  Musculoskeletal: No deformities, no cyanosis or clubbing  Neuro: alert, non focal  Skin: Warm, no lesions or rashes Dg Shoulder Right  07/01/2012  *RADIOLOGY REPORT*  Clinical Data: Anterior shoulder pain, fell 1 month ago  RIGHT SHOULDER - 2+ VIEW  Comparison: None  Findings: AC joint alignment normal. Osseous demineralization. No acute fracture, dislocation, or bone destruction. Visualized right ribs intact.  IMPRESSION: No acute osseous abnormalities.   Original Report Authenticated By: Lollie Marrow, M.D.      Assessment & Plan:   COPD (chronic obstructive pulmonary disease) Stable Gold C copd oxygen dependent at night Plan Cont Qvar and albuterol neb med Cont qhs oxygen Flu vaccine and pneumovax  Hypertension Only needs HCTZ 25mg  /d for now Refill meds until can see PCP    Updated Medication List Outpatient Encounter Prescriptions as of 07/02/2012  Medication Sig Dispense Refill  . oxyCODONE-acetaminophen (PERCOCET/ROXICET) 5-325 MG per tablet Take 1 tablet by mouth every 4 (four) hours as needed for pain.  12 tablet  0  . albuterol (PROVENTIL) (2.5 MG/3ML) 0.083% nebulizer solution Take 3 mLs (2.5 mg total) by  nebulization 2 (two) times daily. DX:  496  120 mL  11  . beclomethasone (QVAR) 40 MCG/ACT inhaler Inhale 2 puffs into the lungs 2 (two) times daily.  1 Inhaler  11  . fluticasone (FLONASE) 50 MCG/ACT nasal spray Place 2 sprays into the nose daily.  16 g  11  . hydrochlorothiazide (HYDRODIURIL) 25 MG tablet Take 1 tablet (25 mg total) by mouth daily.  30 tablet  11  . topiramate (TOPAMAX) 100 MG tablet Take 2 tablets (200 mg total) by mouth at bedtime.  60 tablet  11  . DISCONTD: albuterol (PROVENTIL) (2.5 MG/3ML) 0.083% nebulizer solution Take 3 mLs (2.5 mg total) by nebulization 2 (two) times daily. DX:  496  75 mL  0  . DISCONTD: beclomethasone (QVAR) 40 MCG/ACT inhaler Inhale 2 puffs into the lungs 2 (two) times daily.  1 Inhaler  0  .  DISCONTD: furosemide (LASIX) 20 MG tablet Take 20 mg by mouth daily.      Marland Kitchen DISCONTD: hydrochlorothiazide (HYDRODIURIL) 25 MG tablet Take 25 mg by mouth daily.      Marland Kitchen DISCONTD: losartan (COZAAR) 100 MG tablet Take 100 mg by mouth daily.      Marland Kitchen DISCONTD: topiramate (TOPAMAX) 100 MG tablet Take 200 mg by mouth at bedtime.

## 2012-07-03 NOTE — ED Provider Notes (Signed)
History     CSN: [id number]  Arrival date & time 06/30/12  2300   First MD Initiated Contact with Patient 07/01/12 [phone]      Chief Complaint  Patient presents with  . Shoulder Pain    (Consider location/radiation/quality/duration/timing/severity/associated sxs/prior treatment) Patient is a 51 y.o. female presenting with shoulder pain. The history is provided by the patient. No language interpreter was used.  Shoulder Pain This is a chronic problem. The current episode started more than 1 month ago. The problem occurs daily. The problem has been gradually worsening. Associated symptoms include arthralgias. Pertinent negatives include no fever, joint swelling, nausea, vomiting or weakness. The symptoms are aggravated by bending (internal rotation external rotation hand behind back). The treatment provided moderate relief.    Past Medical History  Diagnosis Date  . COPD (chronic obstructive pulmonary disease)   . CHF (congestive heart failure)   . SOB (shortness of breath)   . Urinary incontinence   . Asthma   . Hypertension     Past Surgical History  Procedure Date  . Cesarean section   . Ectopic pregnancy surgery   . Knee surgery     Family History  Problem Relation Age of Onset  . Stroke Mother   . Asthma Mother     History  Substance Use Topics  . Smoking status: Former Smoker -- 1.5 packs/day for 30 years    Types: Cigarettes    Quit date: 10/27/2010  . Smokeless tobacco: Never Used  . Alcohol Use: No    OB History    Grav Para Term Preterm Abortions TAB SAB Ect Mult Living   5 4 4  1   1  4       Review of Systems  Constitutional: Negative.  Negative for fever.  HENT: Negative.   Eyes: Negative.   Respiratory: Negative.   Cardiovascular: Negative.   Gastrointestinal: Negative.  Negative for nausea and vomiting.  Musculoskeletal: Positive for arthralgias. Negative for joint swelling.  Neurological: Negative.  Negative for weakness.    Psychiatric/Behavioral: Negative.   All other systems reviewed and are negative.    Allergies  Review of patient's allergies indicates no known allergies.  Home Medications   Current Outpatient Rx  Name Route Sig Dispense Refill  . ALBUTEROL SULFATE (2.5 MG/3ML) 0.083% IN NEBU Nebulization Take 3 mLs (2.5 mg total) by nebulization 2 (two) times daily. DX:  496 120 mL 11  . BECLOMETHASONE DIPROPIONATE 40 MCG/ACT IN AERS Inhalation Inhale 2 puffs into the lungs 2 (two) times daily. 1 Inhaler 11  . FLUTICASONE PROPIONATE 50 MCG/ACT NA SUSP Nasal Place 2 sprays into the nose daily. 16 g 11  . HYDROCHLOROTHIAZIDE 25 MG PO TABS Oral Take 1 tablet (25 mg total) by mouth daily. 30 tablet 11  . OXYCODONE-ACETAMINOPHEN 5-325 MG PO TABS Oral Take 1 tablet by mouth every 4 (four) hours as needed for pain. 12 tablet 0  . TOPIRAMATE 100 MG PO TABS Oral Take 2 tablets (200 mg total) by mouth at bedtime. 60 tablet 11    BP 166/96  Pulse 96  Temp 98 F (36.7 C)  Resp 18  SpO2 100%  Physical Exam  Nursing note and vitals reviewed. Constitutional: She is oriented to person, place, and time. She appears well-developed and well-nourished.  HENT:  Head: Normocephalic and atraumatic.  Eyes: Conjunctivae and EOM are normal. Pupils are equal, round, and reactive to light.  Neck: Normal range of motion. Neck supple.  Cardiovascular: Normal rate.  Pulmonary/Chest: Effort normal.  Abdominal: Soft.  Musculoskeletal: Normal range of motion. She exhibits tenderness. She exhibits no edema.       tenderness with anterior and posterior palpatation of R shoulder + CSM  Neurological: She is alert and oriented to person, place, and time. She has normal reflexes.  Skin: Skin is warm and dry.  Psychiatric: She has a normal mood and affect.    ED Course  Procedures (including critical care time)  Labs Reviewed - No data to display No results found.   1. Shoulder pain       MDM  r shoulder pain  that is chronic since she fell > 1 month ago.  Was here before but refused x-ray and did not follow up with ortho.  R shoulder film today with no acute findings viewed by myself.  followup with ortho.  Rx for percocet and ibuprofen Ethelda Chick, NP 07/03/12 [phone]

## 2012-07-08 ENCOUNTER — Telehealth: Payer: Self-pay | Admitting: Critical Care Medicine

## 2012-07-08 NOTE — Telephone Encounter (Signed)
Spoke with pt. She is requesting refill on percocet for her shoulder and back pain. I asked if she has seen a PCP yet and she states "still working on this". I advised that we will need PW's approval for this med, and he is out of the office at this time and will not return until 07-13-12. She verbalized understanding. PW, please advise thanks

## 2012-07-08 NOTE — Telephone Encounter (Signed)
She fell and injured her shoulder and she saw urgent care and they rx percocet  I am not Rx her shoulder pain/injury, I am her lung doctor, not her pain doctor

## 2012-07-08 NOTE — Telephone Encounter (Signed)
LMTCB

## 2012-07-09 ENCOUNTER — Telehealth: Payer: Self-pay | Admitting: Critical Care Medicine

## 2012-07-09 NOTE — Telephone Encounter (Signed)
Called back and and spoke with pt. She states that she does not understand we will not prescribe her something for pain. I again advised that she is our pulmonary pt and we do not take care of her primary care needs. I advised that PW had advised her to get PCP and this is what she needs to do for non pulm issues. She argued " how does he know I do not have a PCP" and then I ask her if she does and she says "no I don't. (I already knew this from the conversation that I had with her yesterday. She then stated that she is not only having shoulder pain, but is having back pain also. I advised that until she can get ijn to see a PCP, she can try taking tylenol instead. She states this has been tried and does not work at all. She then started to yell at me and tell me that she does not even know why I called her, and that I am rude and so is everyone else here including Dr Delford Field. I stated "I am sorry that you feel this way, and did not like the answer we gave you and I am hanging up now because the conversation is not going anywhere". Will forward to PW so that he is aware.

## 2012-07-09 NOTE — ED Provider Notes (Signed)
Medical screening examination/treatment/procedure(s) were performed by non-physician practitioner and as supervising physician I was immediately available for consultation/collaboration.  Marwan T Powers, MD 07/09/12 1457 

## 2012-07-09 NOTE — Telephone Encounter (Signed)
Pt called back again today re: rx request (see msg from 07-08-12). Kathryn Mahoney

## 2012-07-09 NOTE — Telephone Encounter (Signed)
Called, spoke with pt.  Informed her Dr. Delford Field will not be able to rx the pain med as he is her lung doctor and does not treat her shoulder pain/injury.  She was understanding of this and voiced no further questions/concerns at this time.

## 2012-07-09 NOTE — Telephone Encounter (Signed)
Per Budd Palmer call pt back to discuss what she is calling about.

## 2012-07-09 NOTE — Telephone Encounter (Signed)
Do not schedule this pt with me again or in this clinic Show to [facility] and to [place]  I am not Rx ANY pain meds for this pt not now not EVER

## 2012-07-13 ENCOUNTER — Encounter: Payer: Self-pay | Admitting: *Deleted

## 2012-07-13 NOTE — Telephone Encounter (Signed)
Discharge letter completed and forwarded to [facility] in Medical Records.

## 2012-07-14 ENCOUNTER — Telehealth: Payer: Self-pay | Admitting: Critical Care Medicine

## 2012-07-14 NOTE — Telephone Encounter (Signed)
Dismissal Letter sent by Certified Mail 07/14/2012  Received the Return Receipt showing the Dismissal Letter has been picked up 08/16/2012

## 2012-08-13 ENCOUNTER — Telehealth: Payer: Self-pay | Admitting: Critical Care Medicine

## 2012-08-13 NOTE — Telephone Encounter (Signed)
Kathryn Mahoney spoke with the pt and advised her to call the pulmonary physician line for a referral.Kathryn Mahoney, CMA

## 2012-08-17 NOTE — Telephone Encounter (Signed)
Duplicate message see other phone note 08/13/12

## 2012-08-23 ENCOUNTER — Encounter (HOSPITAL_COMMUNITY): Payer: Self-pay | Admitting: *Deleted

## 2012-08-23 ENCOUNTER — Emergency Department (HOSPITAL_COMMUNITY)
Admission: EM | Admit: 2012-08-23 | Discharge: 2012-08-24 | Disposition: A | Discharge status: Home / Self Care | Payer: Medicaid Other | Attending: Emergency Medicine | Admitting: Emergency Medicine

## 2012-08-23 ENCOUNTER — Emergency Department (HOSPITAL_COMMUNITY): Payer: Medicaid Other

## 2012-08-23 DIAGNOSIS — J449 Chronic obstructive pulmonary disease, unspecified: Secondary | ICD-10-CM | POA: Insufficient documentation

## 2012-08-23 DIAGNOSIS — I10 Essential (primary) hypertension: Secondary | ICD-10-CM | POA: Insufficient documentation

## 2012-08-23 DIAGNOSIS — Z79899 Other long term (current) drug therapy: Secondary | ICD-10-CM | POA: Insufficient documentation

## 2012-08-23 DIAGNOSIS — R000 Tachycardia, unspecified: Secondary | ICD-10-CM

## 2012-08-23 DIAGNOSIS — I509 Heart failure, unspecified: Secondary | ICD-10-CM | POA: Insufficient documentation

## 2012-08-23 DIAGNOSIS — Z87448 Personal history of other diseases of urinary system: Secondary | ICD-10-CM | POA: Insufficient documentation

## 2012-08-23 DIAGNOSIS — J4489 Other specified chronic obstructive pulmonary disease: Secondary | ICD-10-CM | POA: Insufficient documentation

## 2012-08-23 DIAGNOSIS — R002 Palpitations: Secondary | ICD-10-CM | POA: Insufficient documentation

## 2012-08-23 DIAGNOSIS — Z87891 Personal history of nicotine dependence: Secondary | ICD-10-CM | POA: Insufficient documentation

## 2012-08-23 DIAGNOSIS — I498 Other specified cardiac arrhythmias: Secondary | ICD-10-CM | POA: Insufficient documentation

## 2012-08-23 LAB — CBC WITH DIFFERENTIAL/PLATELET
Basophils Absolute: 0 10*3/uL (ref 0.0–0.1)
HCT: 33 % — ABNORMAL LOW (ref 36.0–46.0)
Lymphocytes Relative: 26 % (ref 12–46)
Monocytes Absolute: 0.6 10*3/uL (ref 0.1–1.0)
Neutro Abs: 6.4 10*3/uL (ref 1.7–7.7)
RDW: 18 % — ABNORMAL HIGH (ref 11.5–15.5)
WBC: 9.7 10*3/uL (ref 4.0–10.5)

## 2012-08-23 LAB — D-DIMER, QUANTITATIVE: D-Dimer, Quant: 0.36 ug/mL-FEU (ref 0.00–0.48)

## 2012-08-23 LAB — BASIC METABOLIC PANEL
CO2: 27 mEq/L (ref 19–32)
Chloride: 103 mEq/L (ref 96–112)
Creatinine, Ser: 0.75 mg/dL (ref 0.50–1.10)
Sodium: 140 mEq/L (ref 135–145)

## 2012-08-23 LAB — URINALYSIS, ROUTINE W REFLEX MICROSCOPIC
Glucose, UA: NEGATIVE mg/dL
Leukocytes, UA: NEGATIVE
Specific Gravity, Urine: 1.028 (ref 1.005–1.030)
pH: 5.5 (ref 5.0–8.0)

## 2012-08-23 LAB — POCT I-STAT, CHEM 8
BUN: 25 mg/dL — ABNORMAL HIGH (ref 6–23)
Chloride: 107 mEq/L (ref 96–112)
Potassium: 5.7 mEq/L — ABNORMAL HIGH (ref 3.5–5.1)
Sodium: 141 mEq/L (ref 135–145)

## 2012-08-23 NOTE — ED Notes (Signed)
Pt reports intermittent chest pain x3 days - states the pain "is like a fluttering," pt admitted to left arm tingling yesterday evening w/ chest pain. Pt admits to some nausea, denies vomiting or shortness of breath. Pt states she is pain free at present. Pt in no acute distress.

## 2012-08-24 LAB — PRO B NATRIURETIC PEPTIDE: Pro B Natriuretic peptide (BNP): 65.3 pg/mL (ref 0–125)

## 2012-08-24 NOTE — ED Provider Notes (Signed)
History     CSN: [id number]  Arrival date & time 08/23/12  2047   First MD Initiated Contact with Patient 08/23/12 2257      Chief Complaint  Patient presents with  . Chest Pain    (Consider location/radiation/quality/duration/timing/severity/associated sxs/prior treatment) HPI 51 yo female presents to the ED with complaint of intermittent fluttering in her chest for the last 3-4 days lasting a few seconds at a time.  Pt reports she has been under increased stress with her daughter and 3 grandchildren that she is raising.  Today she had some tingling in her left arm at the elbow.  No pain, no shortness of breath, no leg swelling, no estrogens, no prolonged immobilization, no h/o dvt or pe.  Pt reports h/o CHF, which "went away".  Pt has been seen by pulmonology, has been referred back to pcm which she doesn't have.  Pt is a smoker.  No weight gain, has lost 25 lb through diet and exercise.    Past Medical History  Diagnosis Date  . COPD (chronic obstructive pulmonary disease)   . CHF (congestive heart failure)   . SOB (shortness of breath)   . Urinary incontinence   . Asthma   . Hypertension     Past Surgical History  Procedure Date  . Cesarean section   . Ectopic pregnancy surgery   . Knee surgery     Family History  Problem Relation Age of Onset  . Stroke Mother   . Asthma Mother     History  Substance Use Topics  . Smoking status: Former Smoker -- 1.5 packs/day for 30 years    Types: Cigarettes    Quit date: 10/27/2010  . Smokeless tobacco: Never Used  . Alcohol Use: No    OB History    Grav Para Term Preterm Abortions TAB SAB Ect Mult Living   5 4 4  1   1  4       Review of Systems  All other systems reviewed and are negative.    Allergies  Review of patient's allergies indicates no known allergies.  Home Medications   Current Outpatient Rx  Name Route Sig Dispense Refill  . ALBUTEROL SULFATE (2.5 MG/3ML) 0.083% IN NEBU Nebulization Take 3 mLs  (2.5 mg total) by nebulization 2 (two) times daily. DX:  496 120 mL 11  . BECLOMETHASONE DIPROPIONATE 40 MCG/ACT IN AERS Inhalation Inhale 2 puffs into the lungs 2 (two) times daily.    Marland Kitchen FLUTICASONE PROPIONATE 50 MCG/ACT NA SUSP Nasal Place 2 sprays into the nose daily. 16 g 11  . HYDROCHLOROTHIAZIDE 25 MG PO TABS Oral Take 25 mg by mouth daily.    . TOPIRAMATE 100 MG PO TABS Oral Take 100 mg by mouth at bedtime.      BP 130/78  Pulse 107  Temp 98.7 F (37.1 C) (Oral)  Resp 18  Ht 5\' 9"  (1.753 m)  Wt 280 lb (127.007 kg)  BMI 41.35 kg/m2  SpO2 97%  Physical Exam  Nursing note and vitals reviewed. Constitutional: She is oriented to person, place, and time. She appears well-developed and well-nourished.       obese  HENT:  Head: Normocephalic and atraumatic.  Nose: Nose normal.  Mouth/Throat: Oropharynx is clear and moist.  Eyes: Conjunctivae normal and EOM are normal. Pupils are equal, round, and reactive to light.  Neck: Normal range of motion. Neck supple. No JVD present. No tracheal deviation present. No thyromegaly present.  Cardiovascular: Normal rate, regular  rhythm, normal heart sounds and intact distal pulses.  Exam reveals no gallop and no friction rub.   No murmur heard. Pulmonary/Chest: Effort normal and breath sounds normal. No stridor. No respiratory distress. She has no wheezes. She has no rales. She exhibits no tenderness.  Abdominal: Soft. Bowel sounds are normal. She exhibits no distension and no mass. There is no tenderness. There is no rebound and no guarding.  Musculoskeletal: Normal range of motion. She exhibits no edema and no tenderness.  Lymphadenopathy:    She has no cervical adenopathy.  Neurological: She is alert and oriented to person, place, and time. She exhibits normal muscle tone. Coordination normal.  Skin: Skin is warm and dry. No rash noted. No erythema. No pallor.  Psychiatric: She has a normal mood and affect. Her behavior is normal. Judgment  and thought content normal.    ED Course  Procedures (including critical care time)  Labs Reviewed  CBC WITH DIFFERENTIAL - Abnormal; Notable for the following:    Hemoglobin 10.3 (*)     HCT 33.0 (*)     MCV 72.1 (*)     MCH 22.5 (*)     RDW 18.0 (*)     Platelets 442 (*)     All other components within normal limits  BASIC METABOLIC PANEL - Abnormal; Notable for the following:    Glucose, Bld 122 (*)     All other components within normal limits  POCT I-STAT, CHEM 8 - Abnormal; Notable for the following:    Potassium 5.7 (*)     BUN 25 (*)     Glucose, Bld 122 (*)     Hemoglobin 11.9 (*)     HCT 35.0 (*)     All other components within normal limits  URINALYSIS, ROUTINE W REFLEX MICROSCOPIC  PRO B NATRIURETIC PEPTIDE  D-DIMER, QUANTITATIVE  TROPONIN I     Date: 08/24/2012  Rate:108  Rhythm: sinus tachycardia  QRS Axis: normal  Intervals: normal  ST/T Wave abnormalities: normal  Conduction Disutrbances:nonspecific intraventricular conduction delay  Narrative Interpretation:   Old EKG Reviewed: unchanged   Dg Chest 2 View  08/23/2012  *RADIOLOGY REPORT*  Clinical Data: Chest pain and shortness of breath.  CHEST - 2 VIEW  Comparison: Two-view chest 10/30/2011.  Findings: Mild cardiac enlargement is present.  Mild point vascular congestion is evident.  No focal airspace disease is evident. There are no effusions.  IMPRESSION:  1.  Mild cardiac enlargement and pulmonary vascular congestion without evidence for frank edema. 2.  No focal airspace disease.   Original Report Authenticated By: Jamesetta Orleans. MATTERN, M.D.      1. Palpitations   2. Sinus tachycardia       MDM  50 year old female with intermittent palpitations. Mild tachycardia on exam, but no other significant findings. Her lab work is unremarkable. Her EKG shows sinus tachycardia. We'll refer patient to local primary care Dr. do not feel symptoms are due to A. fib, PE, ACS.       Olivia Mackie,  MD 08/24/12 [phone]

## 2012-08-25 ENCOUNTER — Other Ambulatory Visit: Payer: Self-pay | Admitting: Critical Care Medicine

## 2012-10-21 ENCOUNTER — Encounter (HOSPITAL_COMMUNITY): Payer: Self-pay | Admitting: *Deleted

## 2012-10-21 ENCOUNTER — Emergency Department (HOSPITAL_COMMUNITY)
Admission: EM | Admit: 2012-10-21 | Discharge: 2012-10-21 | Disposition: A | Discharge status: Home / Self Care | Payer: Medicaid Other | Attending: Emergency Medicine | Admitting: Emergency Medicine

## 2012-10-21 DIAGNOSIS — J40 Bronchitis, not specified as acute or chronic: Secondary | ICD-10-CM | POA: Insufficient documentation

## 2012-10-21 DIAGNOSIS — Z87448 Personal history of other diseases of urinary system: Secondary | ICD-10-CM | POA: Insufficient documentation

## 2012-10-21 DIAGNOSIS — R350 Frequency of micturition: Secondary | ICD-10-CM | POA: Insufficient documentation

## 2012-10-21 DIAGNOSIS — Z87891 Personal history of nicotine dependence: Secondary | ICD-10-CM | POA: Insufficient documentation

## 2012-10-21 DIAGNOSIS — Z79899 Other long term (current) drug therapy: Secondary | ICD-10-CM | POA: Insufficient documentation

## 2012-10-21 DIAGNOSIS — I509 Heart failure, unspecified: Secondary | ICD-10-CM | POA: Insufficient documentation

## 2012-10-21 DIAGNOSIS — N949 Unspecified condition associated with female genital organs and menstrual cycle: Secondary | ICD-10-CM | POA: Insufficient documentation

## 2012-10-21 DIAGNOSIS — R102 Pelvic and perineal pain: Secondary | ICD-10-CM

## 2012-10-21 DIAGNOSIS — I10 Essential (primary) hypertension: Secondary | ICD-10-CM | POA: Insufficient documentation

## 2012-10-21 DIAGNOSIS — J45909 Unspecified asthma, uncomplicated: Secondary | ICD-10-CM | POA: Insufficient documentation

## 2012-10-21 LAB — URINALYSIS, ROUTINE W REFLEX MICROSCOPIC
Leukocytes, UA: NEGATIVE
Protein, ur: NEGATIVE mg/dL
Urobilinogen, UA: 1 mg/dL (ref 0.0–1.0)

## 2012-10-21 LAB — WET PREP, GENITAL: Clue Cells Wet Prep HPF POC: NONE SEEN

## 2012-10-21 MED ORDER — ACETAMINOPHEN 325 MG PO TABS
650.0000 mg | ORAL_TABLET | Freq: Once | ORAL | Status: AC
  Administered 2012-10-21: 650 mg via ORAL
  Filled 2012-10-21: qty 2

## 2012-10-21 MED ORDER — METRONIDAZOLE 500 MG PO TABS: 500.0000 mg | ORAL_TABLET | Freq: Two times a day (BID) | ORAL | Status: DC

## 2012-10-21 MED ORDER — TRAMADOL HCL 50 MG PO TABS
100.0000 mg | ORAL_TABLET | Freq: Once | ORAL | Status: AC
  Administered 2012-10-21: 100 mg via ORAL
  Filled 2012-10-21: qty 2

## 2012-10-21 MED ORDER — DOXYCYCLINE HYCLATE 100 MG PO CAPS: 100.0000 mg | ORAL_CAPSULE | Freq: Two times a day (BID) | ORAL | Status: DC

## 2012-10-21 MED ORDER — CEFTRIAXONE SODIUM 250 MG IJ SOLR
250.0000 mg | Freq: Once | INTRAMUSCULAR | Status: AC
Start: 2012-10-21 — End: 2012-10-21
  Administered 2012-10-21: 250 mg via INTRAMUSCULAR
  Filled 2012-10-21: qty 250

## 2012-10-21 MED ORDER — TRAMADOL-ACETAMINOPHEN 37.5-325 MG PO TABS: ORAL_TABLET | ORAL | Status: DC

## 2012-10-21 NOTE — ED Notes (Signed)
Pt c/o vaginal discharge; lower back pain

## 2012-10-21 NOTE — ED Provider Notes (Signed)
History     CSN: [id number]  Arrival date & time 10/21/12  0300   First MD Initiated Contact with Patient 10/21/12 0352      No chief complaint on file.   (Consider location/radiation/quality/duration/timing/severity/associated sxs/prior treatment) HPI  Patient is G5 P4 Ab1 (ectopic pregnancy). She relates she started having irregular menses and her last period was a few months ago. She relates her last sexual contact was about 4-6 months ago. She relates she started having low back pain the last couple days that she relates she's had before with urinary tract infection. She relates chronic urinary incontinence from a prolapsed bladder, she states she has frequency of urination do her to her blood pressure medication. She states she's had a vaginal discharge for about a month and some itching in her groin.  PCP Dr. Lerry Liner  Past Medical History  Diagnosis Date  . COPD (chronic obstructive pulmonary disease)   . CHF (congestive heart failure)   . SOB (shortness of breath)   . Urinary incontinence   . Asthma   . Hypertension     Past Surgical History  Procedure Date  . Cesarean section   . Ectopic pregnancy surgery   . Knee surgery     Family History  Problem Relation Age of Onset  . Stroke Mother   . Asthma Mother     History  Substance Use Topics  . Smoking status: Former Smoker -- 1.5 packs/day for 30 years    Types: Cigarettes    Quit date: 10/27/2010  . Smokeless tobacco: Never Used  . Alcohol Use: No  lives with daughter  OB History    Grav Para Term Preterm Abortions TAB SAB Ect Mult Living   5 4 4  1   1  4       Review of Systems  All other systems reviewed and are negative.    Allergies  Review of patient's allergies indicates no known allergies.  Home Medications   Current Outpatient Rx  Name  Route  Sig  Dispense  Refill  . ALBUTEROL SULFATE (2.5 MG/3ML) 0.083% IN NEBU   Nebulization   Take 3 mLs (2.5 mg total) by nebulization 2  (two) times daily. DX:  496   120 mL   11   . BECLOMETHASONE DIPROPIONATE 40 MCG/ACT IN AERS   Inhalation   Inhale 2 puffs into the lungs 2 (two) times daily.         Marland Kitchen FLUTICASONE PROPIONATE 50 MCG/ACT NA SUSP   Nasal   Place 2 sprays into the nose daily.   16 g   11   . HYDROCHLOROTHIAZIDE 25 MG PO TABS   Oral   Take 25 mg by mouth daily.         . IBUPROFEN 200 MG PO TABS   Oral   Take 200 mg by mouth every 6 (six) hours as needed. Back pain           BP 147/89  Pulse 100  Temp 98.5 F (36.9 C)  Resp 20  SpO2 97%  Vital signs normal    Physical Exam  Nursing note and vitals reviewed. Constitutional: She is oriented to person, place, and time. She appears well-developed and well-nourished.  Non-toxic appearance. She does not appear ill. No distress.  HENT:  Head: Normocephalic and atraumatic.  Right Ear: External ear normal.  Left Ear: External ear normal.  Nose: Nose normal. No mucosal edema or rhinorrhea.  Mouth/Throat: Oropharynx is clear and moist  and mucous membranes are normal. No dental abscesses or uvula swelling.  Eyes: Conjunctivae normal and EOM are normal. Pupils are equal, round, and reactive to light.  Neck: Normal range of motion and full passive range of motion without pain. Neck supple.  Cardiovascular: Normal rate, regular rhythm and normal heart sounds.  Exam reveals no gallop and no friction rub.   No murmur heard. Pulmonary/Chest: Effort normal and breath sounds normal. No respiratory distress. She has no wheezes. She has no rhonchi. She has no rales. She exhibits no tenderness and no crepitus.  Abdominal: Soft. Normal appearance and bowel sounds are normal. She exhibits no distension. There is no tenderness. There is no rebound and no guarding.  Genitourinary:       Normal external genitalia, watery discharge with brown tint, nontender uterus and adnexa unable to assess size due to the thickness of her abdominal wall  Musculoskeletal:  Normal range of motion. She exhibits no edema and no tenderness.       Moves all extremities well.   Patient indicates her pain is in the lower sacral area of her back  Neurological: She is alert and oriented to person, place, and time. She has normal strength. No cranial nerve deficit.  Skin: Skin is warm, dry and intact. No rash noted. No erythema. No pallor.  Psychiatric: She has a normal mood and affect. Her speech is normal and behavior is normal. Her mood appears not anxious.    ED Course  Procedures (including critical care time)   Medications  cefTRIAXone (ROCEPHIN) injection 250 mg (not administered)  traMADol (ULTRAM) tablet 100 mg (not administered)  acetaminophen (TYLENOL) tablet 650 mg (not administered)    Pt given results of tests, wants to be treated for all STD's.   Results for orders placed during the hospital encounter of 10/21/12  URINALYSIS, ROUTINE W REFLEX MICROSCOPIC      Component Value Range   Color, Urine YELLOW  YELLOW   APPearance CLEAR  CLEAR   Specific Gravity, Urine 1.039 (*) 1.005 - 1.030   pH 5.5  5.0 - 8.0   Glucose, UA NEGATIVE  NEGATIVE mg/dL   Hgb urine dipstick NEGATIVE  NEGATIVE   Bilirubin Urine NEGATIVE  NEGATIVE   Ketones, ur TRACE (*) NEGATIVE mg/dL   Protein, ur NEGATIVE  NEGATIVE mg/dL   Urobilinogen, UA 1.0  0.0 - 1.0 mg/dL   Nitrite NEGATIVE  NEGATIVE   Leukocytes, UA NEGATIVE  NEGATIVE  WET PREP, GENITAL      Component Value Range   Yeast Wet Prep HPF POC NONE SEEN  NONE SEEN   Trich, Wet Prep NONE SEEN  NONE SEEN   Clue Cells Wet Prep HPF POC NONE SEEN  NONE SEEN   WBC, Wet Prep HPF POC FEW (*) NONE SEEN   Laboratory interpretation all normal except concentrated urine       1. Pelvic pain in female    New Prescriptions   DOXYCYCLINE (VIBRAMYCIN) 100 MG CAPSULE    Take 1 capsule (100 mg total) by mouth 2 (two) times daily.   METRONIDAZOLE (FLAGYL) 500 MG TABLET    Take 1 tablet (500 mg total) by mouth 2 (two) times  daily.   TRAMADOL-ACETAMINOPHEN (ULTRACET) 37.5-325 MG PER TABLET    2 tabs po QID prn pain    Plan discharge  Devoria Albe, MD, Armando Gang   MDM          Ward Givens, MD 10/21/12 [phone]

## 2012-10-22 LAB — GC/CHLAMYDIA PROBE AMP: CT Probe RNA: NEGATIVE

## 2013-05-07 ENCOUNTER — Emergency Department (HOSPITAL_COMMUNITY)
Admission: EM | Admit: 2013-05-07 | Discharge: 2013-05-07 | Disposition: A | Discharge status: Home / Self Care | Payer: Medicaid Other | Attending: Emergency Medicine | Admitting: Emergency Medicine

## 2013-05-07 ENCOUNTER — Encounter (HOSPITAL_COMMUNITY): Payer: Self-pay | Admitting: Emergency Medicine

## 2013-05-07 DIAGNOSIS — Z79899 Other long term (current) drug therapy: Secondary | ICD-10-CM | POA: Insufficient documentation

## 2013-05-07 DIAGNOSIS — J4489 Other specified chronic obstructive pulmonary disease: Secondary | ICD-10-CM | POA: Insufficient documentation

## 2013-05-07 DIAGNOSIS — J449 Chronic obstructive pulmonary disease, unspecified: Secondary | ICD-10-CM | POA: Insufficient documentation

## 2013-05-07 DIAGNOSIS — M549 Dorsalgia, unspecified: Secondary | ICD-10-CM

## 2013-05-07 DIAGNOSIS — J45909 Unspecified asthma, uncomplicated: Secondary | ICD-10-CM | POA: Insufficient documentation

## 2013-05-07 DIAGNOSIS — I10 Essential (primary) hypertension: Secondary | ICD-10-CM | POA: Insufficient documentation

## 2013-05-07 DIAGNOSIS — M545 Low back pain, unspecified: Secondary | ICD-10-CM | POA: Insufficient documentation

## 2013-05-07 DIAGNOSIS — Z87891 Personal history of nicotine dependence: Secondary | ICD-10-CM | POA: Insufficient documentation

## 2013-05-07 DIAGNOSIS — G8929 Other chronic pain: Secondary | ICD-10-CM | POA: Insufficient documentation

## 2013-05-07 DIAGNOSIS — I509 Heart failure, unspecified: Secondary | ICD-10-CM | POA: Insufficient documentation

## 2013-05-07 DIAGNOSIS — Z87448 Personal history of other diseases of urinary system: Secondary | ICD-10-CM | POA: Insufficient documentation

## 2013-05-07 MED ORDER — KETOROLAC TROMETHAMINE 60 MG/2ML IM SOLN
60.0000 mg | Freq: Once | INTRAMUSCULAR | Status: AC
  Administered 2013-05-07: 60 mg via INTRAMUSCULAR
  Filled 2013-05-07: qty 2

## 2013-05-07 MED ORDER — CYCLOBENZAPRINE HCL 10 MG PO TABS: 10.0000 mg | ORAL_TABLET | Freq: Two times a day (BID) | ORAL | Status: DC | PRN

## 2013-05-07 NOTE — ED Provider Notes (Signed)
History    CSN: [id number] Arrival date & time 05/07/13  4098  First MD Initiated Contact with Patient 05/07/13 [phone]     Chief Complaint  Patient presents with  . Back Pain   (Consider location/radiation/quality/duration/timing/severity/associated sxs/prior Treatment) HPI Comments: Patient reports 4 days of low back, "the same as I had last time when I came here and they gave me a shot and some pills."   States she has had no injury to her back, it just gradually began hurting, worse on left, sometimes radiates to the right.  Pain occurs after standing for long periods of time.  Denies fevers, chills, bowel or bladder incontinence, abdominal pain, urinary or vaginal complaints.  Pain occasionally radiates down the back of her left leg but denies weakness or numbness.    Patient is a 52 y.o. female presenting with back pain. The history is provided by the patient.  Back Pain Associated symptoms: no abdominal pain, no dysuria, no fever, no numbness and no weakness    Past Medical History  Diagnosis Date  . COPD (chronic obstructive pulmonary disease)   . CHF (congestive heart failure)   . SOB (shortness of breath)   . Urinary incontinence   . Asthma   . Hypertension    Past Surgical History  Procedure Laterality Date  . Cesarean section    . Ectopic pregnancy surgery    . Knee surgery     Family History  Problem Relation Age of Onset  . Stroke Mother   . Asthma Mother    History  Substance Use Topics  . Smoking status: Former Smoker -- 1.50 packs/day for 30 years    Types: Cigarettes    Quit date: 10/27/2010  . Smokeless tobacco: Never Used  . Alcohol Use: No   OB History   Grav Para Term Preterm Abortions TAB SAB Ect Mult Living   5 4 4  1   1  4      Review of Systems  Constitutional: Negative for fever and chills.  Gastrointestinal: Negative for nausea, vomiting, abdominal pain and diarrhea.  Genitourinary: Negative for dysuria, urgency, frequency, vaginal  bleeding and vaginal discharge.  Musculoskeletal: Positive for back pain.  Skin: Negative for rash.  Neurological: Negative for weakness and numbness.    Allergies  Review of patient's allergies indicates no known allergies.  Home Medications   Current Outpatient Rx  Name  Route  Sig  Dispense  Refill  . albuterol (PROVENTIL) (2.5 MG/3ML) 0.083% nebulizer solution   Nebulization   Take 3 mLs (2.5 mg total) by nebulization 2 (two) times daily. DX:  496   120 mL   11   . beclomethasone (QVAR) 40 MCG/ACT inhaler   Inhalation   Inhale 2 puffs into the lungs 2 (two) times daily.         . Olmesartan-Amlodipine-HCTZ (TRIBENZOR) 20-5-12.5 MG TABS   Oral   Take 1 tablet by mouth daily.         . cyclobenzaprine (FLEXERIL) 10 MG tablet   Oral   Take 1 tablet (10 mg total) by mouth 2 (two) times daily as needed for muscle spasms.   8 tablet   0    BP 153/82  Pulse 99  Temp(Src) 98.6 F (37 C) (Oral)  Resp 18  SpO2 100% Physical Exam  Nursing note and vitals reviewed. Constitutional: She appears well-developed and well-nourished. No distress.  HENT:  Head: Normocephalic and atraumatic.  Neck: Neck supple.  Cardiovascular: Intact distal pulses.  Pulmonary/Chest: Effort normal.  Abdominal: Soft. She exhibits no distension. There is no tenderness. There is no rebound and no guarding.  obese  Musculoskeletal: She exhibits no edema.  Spine is without crepitus or stepoffs  Neurological: She is alert. She exhibits normal muscle tone.  Gait is normal  Skin: She is not diaphoretic.    ED Course  Procedures (including critical care time) Labs Reviewed - No data to display No results found.   1. Back pain   2. Chronic pain     MDM  Nontraumatic back pain.  No red flags.  No associated symptoms concerning for intraabdominal process, etc.  Pt appeared to be focused on pain pills rather than on her back pain.  Pt insisted on getting a shot for her pain.  Pt given IM  toradol and d/c with #10 flexeril.  I have explained to her that she must have her chronic pain cared for by her PCP - complained of right shoulder and left knee pain that is unchanged x years. Doubt acute process.  Discussed findings, treatment, and follow up with patient.  Pt given return precautions.  Pt verbalizes understanding and agrees with plan.       [place], PA-C 05/07/13 [phone]

## 2013-05-07 NOTE — ED Notes (Signed)
Pt states that she has been having low back pain for months and has been seen here for it before.  Was told that there was inflammation in her back and was given a shot and pain pills.  States this episode of pain has been going on for about 4 days.

## 2013-05-10 NOTE — ED Provider Notes (Signed)
Medical screening examination/treatment/procedure(s) were performed by non-physician practitioner and as supervising physician I was immediately available for consultation/collaboration.  Derwood Kaplan, MD 05/10/13 [phone]

## 2013-05-14 ENCOUNTER — Emergency Department (HOSPITAL_COMMUNITY)
Admission: EM | Admit: 2013-05-14 | Discharge: 2013-05-15 | Disposition: A | Discharge status: Home / Self Care | Payer: Medicaid Other | Attending: Emergency Medicine | Admitting: Emergency Medicine

## 2013-05-14 ENCOUNTER — Encounter (HOSPITAL_COMMUNITY): Payer: Self-pay | Admitting: Emergency Medicine

## 2013-05-14 DIAGNOSIS — E876 Hypokalemia: Secondary | ICD-10-CM

## 2013-05-14 DIAGNOSIS — N938 Other specified abnormal uterine and vaginal bleeding: Secondary | ICD-10-CM | POA: Insufficient documentation

## 2013-05-14 DIAGNOSIS — D219 Benign neoplasm of connective and other soft tissue, unspecified: Secondary | ICD-10-CM

## 2013-05-14 DIAGNOSIS — N949 Unspecified condition associated with female genital organs and menstrual cycle: Secondary | ICD-10-CM | POA: Insufficient documentation

## 2013-05-14 DIAGNOSIS — N9489 Other specified conditions associated with female genital organs and menstrual cycle: Secondary | ICD-10-CM | POA: Insufficient documentation

## 2013-05-14 DIAGNOSIS — Z3202 Encounter for pregnancy test, result negative: Secondary | ICD-10-CM | POA: Insufficient documentation

## 2013-05-14 DIAGNOSIS — R109 Unspecified abdominal pain: Secondary | ICD-10-CM | POA: Insufficient documentation

## 2013-05-14 DIAGNOSIS — I509 Heart failure, unspecified: Secondary | ICD-10-CM | POA: Insufficient documentation

## 2013-05-14 DIAGNOSIS — D259 Leiomyoma of uterus, unspecified: Secondary | ICD-10-CM | POA: Insufficient documentation

## 2013-05-14 DIAGNOSIS — I10 Essential (primary) hypertension: Secondary | ICD-10-CM | POA: Insufficient documentation

## 2013-05-14 DIAGNOSIS — Z87891 Personal history of nicotine dependence: Secondary | ICD-10-CM | POA: Insufficient documentation

## 2013-05-14 DIAGNOSIS — Z79899 Other long term (current) drug therapy: Secondary | ICD-10-CM | POA: Insufficient documentation

## 2013-05-14 DIAGNOSIS — N858 Other specified noninflammatory disorders of uterus: Secondary | ICD-10-CM

## 2013-05-14 DIAGNOSIS — J441 Chronic obstructive pulmonary disease with (acute) exacerbation: Secondary | ICD-10-CM | POA: Insufficient documentation

## 2013-05-14 LAB — URINALYSIS, ROUTINE W REFLEX MICROSCOPIC
Glucose, UA: NEGATIVE mg/dL
Ketones, ur: NEGATIVE mg/dL
pH: 6 (ref 5.0–8.0)

## 2013-05-14 LAB — CBC WITH DIFFERENTIAL/PLATELET
Eosinophils Relative: 1 % (ref 0–5)
Hemoglobin: 10.8 g/dL — ABNORMAL LOW (ref 12.0–15.0)
Lymphocytes Relative: 16 % (ref 12–46)
Lymphs Abs: 1.8 10*3/uL (ref 0.7–4.0)
MCV: 77.7 fL — ABNORMAL LOW (ref 78.0–100.0)
Platelets: 401 10*3/uL — ABNORMAL HIGH (ref 150–400)
RBC: 4.49 MIL/uL (ref 3.87–5.11)
WBC: 11.5 10*3/uL — ABNORMAL HIGH (ref 4.0–10.5)

## 2013-05-14 LAB — URINE MICROSCOPIC-ADD ON

## 2013-05-14 LAB — WET PREP, GENITAL
Trich, Wet Prep: NONE SEEN
Yeast Wet Prep HPF POC: NONE SEEN

## 2013-05-14 LAB — BASIC METABOLIC PANEL
CO2: 32 mEq/L (ref 19–32)
Calcium: 9.7 mg/dL (ref 8.4–10.5)
Chloride: 96 mEq/L (ref 96–112)
Potassium: 3 mEq/L — ABNORMAL LOW (ref 3.5–5.1)
Sodium: 139 mEq/L (ref 135–145)

## 2013-05-14 MED ORDER — MORPHINE SULFATE 4 MG/ML IJ SOLN
4.0000 mg | Freq: Once | INTRAMUSCULAR | Status: AC
  Administered 2013-05-14: 4 mg via INTRAVENOUS
  Filled 2013-05-14: qty 1

## 2013-05-14 NOTE — ED Notes (Signed)
2 unsuccessful IV attempts.

## 2013-05-14 NOTE — ED Notes (Signed)
Ultrasound is at [facility].

## 2013-05-14 NOTE — ED Notes (Addendum)
Pt presents with a chief complaint of lower-centralized abdominal pain. Pt is vomiting during triage, however denies diarrhea. Pt reports shortness of breath and light headiness.  Pt reports the feelings of light-headiness began when she started taking lisinopril. Pt is hypertensive in triage 191/116. Pt reports moderate vaginal bleeding, pt denies vaginal discharge.

## 2013-05-14 NOTE — ED Provider Notes (Signed)
History    CSN: [id number] Arrival date & time 05/14/13  1916  First MD Initiated Contact with Patient 05/14/13 1940     Chief Complaint  Patient presents with  . Abdominal Pain  . Shortness of Breath  . Vaginal Bleeding   (Consider location/radiation/quality/duration/timing/severity/associated sxs/prior Treatment) HPI    complains of low abdominal pain sudden onset approximately 2 hours ago. Pain is nonradiating crampy and severe. Patient vomited one time since onset of pain. She also has had vaginal bleeding since onset of pain. Last bowel movement today was normal. She ate 2 cheeseburgers and macaroni and cheese today. No fever. No other complaint. Other associated symptoms include shortness of breath. Nothing makes symptoms better or worse. No treatment prior to coming here. Past Medical History  Diagnosis Date  . COPD (chronic obstructive pulmonary disease)   . CHF (congestive heart failure)   . SOB (shortness of breath)   . Urinary incontinence   . Asthma   . Hypertension    Past Surgical History  Procedure Laterality Date  . Cesarean section    . Ectopic pregnancy surgery    . Knee surgery     Family History  Problem Relation Age of Onset  . Stroke Mother   . Asthma Mother    History  Substance Use Topics  . Smoking status: Former Smoker -- 1.50 packs/day for 30 years    Types: Cigarettes    Quit date: 10/27/2010  . Smokeless tobacco: Never Used  . Alcohol Use: No   OB History   Grav Para Term Preterm Abortions TAB SAB Ect Mult Living   5 4 4  1   1  4      Review of Systems  Respiratory: Positive for shortness of breath.   Gastrointestinal: Positive for abdominal pain.  Genitourinary: Positive for vaginal bleeding.    Allergies  Review of patient's allergies indicates no known allergies.  Home Medications   Current Outpatient Rx  Name  Route  Sig  Dispense  Refill  . albuterol (PROVENTIL) (2.5 MG/3ML) 0.083% nebulizer solution   Nebulization  Take 3 mLs (2.5 mg total) by nebulization 2 (two) times daily. DX:  496   120 mL   11   . beclomethasone (QVAR) 40 MCG/ACT inhaler   Inhalation   Inhale 2 puffs into the lungs 2 (two) times daily.         . cyclobenzaprine (FLEXERIL) 10 MG tablet   Oral   Take 1 tablet (10 mg total) by mouth 2 (two) times daily as needed for muscle spasms.   8 tablet   0   . Olmesartan-Amlodipine-HCTZ (TRIBENZOR) 20-5-12.5 MG TABS   Oral   Take 1 tablet by mouth daily.          BP 191/116  Pulse 102  Temp(Src) 99.2 F (37.3 C) (Oral)  Resp 24  SpO2 98% Physical Exam  Nursing note and vitals reviewed. Constitutional: She appears well-developed and well-nourished. No distress.  HENT:  Head: Normocephalic and atraumatic.  Eyes: Conjunctivae are normal. Pupils are equal, round, and reactive to light.  Neck: Neck supple. No tracheal deviation present. No thyromegaly present.  Cardiovascular: Normal rate and regular rhythm.   No murmur heard. Pulmonary/Chest: Effort normal and breath sounds normal.  Abdominal: Soft. Bowel sounds are normal. She exhibits no distension. There is no tenderness.   morbidlyObese  Genitourinary:  No external lesion, slight amount of blood in vault. Cervical os closed. Mild cervical motion tenderness no adnexal masses or  tenderness.  Musculoskeletal: Normal range of motion. She exhibits no edema and no tenderness.  Neurological: She is alert. Coordination normal.  Skin: Skin is warm and dry. No rash noted.  Psychiatric: She has a normal mood and affect.    ED Course  Procedures (including critical care time) Labs Reviewed - No data to display No results found. No diagnosis found.  Date: 05/14/2013  Rate: 90  Rhythm: normal sinus rhythm  QRS Axis: normal  Intervals: normal  ST/T Wave abnormalities: nonspecific T wave changes  Conduction Disutrbances:none  Narrative Interpretation:   Old EKG Reviewed: Rate slowed compared to 06/15/2012 otherwise no  significant change interpreted by me  11:50 PM pain improved after treatment with intravenous morphine. Patient resting comfortably. 12:40 AM pain is much improved. She has only slight abdominal pain. Additional morphine ordered. She is no longer dyspneic. MDM  Pelvic ultrasound ordered to sudden onset pain and vaginal bleeding in this presumably postmenopausal female. Pt signed out to Dr. Norlene Campbell at 1240 pm Diagnosis #1 abdominal pain #2 abnormal vaginal bleeding #3 anemia #4Hypokalemia  Doug Sou, MD 05/15/13 [phone]

## 2013-05-14 NOTE — ED Notes (Signed)
Pt able to ambulate to the restroom with no difficulty 

## 2013-05-15 ENCOUNTER — Emergency Department (HOSPITAL_COMMUNITY): Payer: Medicaid Other

## 2013-05-15 MED ORDER — MORPHINE SULFATE 2 MG/ML IJ SOLN
2.0000 mg | Freq: Once | INTRAMUSCULAR | Status: AC
  Administered 2013-05-15: 2 mg via INTRAVENOUS
  Filled 2013-05-15: qty 1

## 2013-05-15 MED ORDER — TRAMADOL HCL 50 MG PO TABS
50.0000 mg | ORAL_TABLET | Freq: Once | ORAL | Status: AC
  Administered 2013-05-15: 50 mg via ORAL
  Filled 2013-05-15: qty 1

## 2013-05-15 MED ORDER — POTASSIUM CHLORIDE CRYS ER 20 MEQ PO TBCR
40.0000 meq | EXTENDED_RELEASE_TABLET | Freq: Once | ORAL | Status: AC
  Administered 2013-05-15: 40 meq via ORAL
  Filled 2013-05-15: qty 2

## 2013-05-15 MED ORDER — TRAMADOL HCL 50 MG PO TABS: 50.0000 mg | ORAL_TABLET | Freq: Four times a day (QID) | ORAL | Status: DC | PRN

## 2013-05-15 NOTE — ED Provider Notes (Signed)
Care assumed from Dr Ethelda Chick at change of shift.  Pt with vaginal bleeding and lower abdominal pain.  Last menstrual cycle 9 months ago.  Awaiting u/s of pelvis.  Results for orders placed during the hospital encounter of 05/14/13  WET PREP, GENITAL      Result Value Range   Yeast Wet Prep HPF POC NONE SEEN  NONE SEEN   Trich, Wet Prep NONE SEEN  NONE SEEN   Clue Cells Wet Prep HPF POC NONE SEEN  NONE SEEN   WBC, Wet Prep HPF POC NONE SEEN  NONE SEEN  BASIC METABOLIC PANEL      Result Value Range   Sodium 139  135 - 145 mEq/L   Potassium 3.0 (*) 3.5 - 5.1 mEq/L   Chloride 96  96 - 112 mEq/L   CO2 32  19 - 32 mEq/L   Glucose, Bld 151 (*) 70 - 99 mg/dL   BUN 14  6 - 23 mg/dL   Creatinine, Ser 1.61  0.50 - 1.10 mg/dL   Calcium 9.7  8.4 - 09.6 mg/dL   GFR calc non Af Amer >90  >90 mL/min   GFR calc Af Amer >90  >90 mL/min  CBC WITH DIFFERENTIAL      Result Value Range   WBC 11.5 (*) 4.0 - 10.5 K/uL   RBC 4.49  3.87 - 5.11 MIL/uL   Hemoglobin 10.8 (*) 12.0 - 15.0 g/dL   HCT 04.5 (*) 40.9 - 81.1 %   MCV 77.7 (*) 78.0 - 100.0 fL   MCH 24.1 (*) 26.0 - 34.0 pg   MCHC 30.9  30.0 - 36.0 g/dL   RDW 91.4 (*) 78.2 - 95.6 %   Platelets 401 (*) 150 - 400 K/uL   Neutrophils Relative % 80 (*) 43 - 77 %   Neutro Abs 9.1 (*) 1.7 - 7.7 K/uL   Lymphocytes Relative 16  12 - 46 %   Lymphs Abs 1.8  0.7 - 4.0 K/uL   Monocytes Relative 3  3 - 12 %   Monocytes Absolute 0.4  0.1 - 1.0 K/uL   Eosinophils Relative 1  0 - 5 %   Eosinophils Absolute 0.1  0.0 - 0.7 K/uL   Basophils Relative 0  0 - 1 %   Basophils Absolute 0.0  0.0 - 0.1 K/uL  URINALYSIS, ROUTINE W REFLEX MICROSCOPIC      Result Value Range   Color, Urine YELLOW  YELLOW   APPearance CLOUDY (*) CLEAR   Specific Gravity, Urine 1.022  1.005 - 1.030   pH 6.0  5.0 - 8.0   Glucose, UA NEGATIVE  NEGATIVE mg/dL   Hgb urine dipstick LARGE (*) NEGATIVE   Bilirubin Urine NEGATIVE  NEGATIVE   Ketones, ur NEGATIVE  NEGATIVE mg/dL   Protein,  ur NEGATIVE  NEGATIVE mg/dL   Urobilinogen, UA 1.0  0.0 - 1.0 mg/dL   Nitrite NEGATIVE  NEGATIVE   Leukocytes, UA TRACE (*) NEGATIVE  URINE MICROSCOPIC-ADD ON      Result Value Range   Squamous Epithelial / LPF RARE  RARE   WBC, UA 0-2  <3 WBC/hpf   RBC / HPF TOO NUMEROUS TO COUNT  <3 RBC/hpf   Bacteria, UA FEW (*) RARE  POCT PREGNANCY, URINE      Result Value Range   Preg Test, Ur NEGATIVE  NEGATIVE   US Transvaginal Non-ob  05/15/2013   *RADIOLOGY REPORT*  Clinical Data: Pelvic pain and cramping; vaginal bleeding.  TRANSABDOMINAL AND  TRANSVAGINAL ULTRASOUND OF PELVIS Technique:  Both transabdominal and transvaginal ultrasound examinations of the pelvis were performed. Transabdominal technique was performed for global imaging of the pelvis including uterus, ovaries, adnexal regions, and pelvic cul-de-sac.  It was necessary to proceed with endovaginal exam following the transabdominal exam to visualize the uterus in greater detail.  Comparison:  None  Findings:  Uterus: Diffusely enlarged, measuring 16.1 x 7.6 x 8.8 cm.  This reflects marked distension of the lower uterine segment and cervix, which is filled with fluid and a complex mildly hyperechoic mass, measuring 7.8 x 2.4 x 2.8 cm.  No associated blood flow is seen on limited Doppler evaluation.  Associated fluid-fluid level and debris are seen.  There is also a 3.9 x 3.4 x 3.8 cm fibroid noted along the anterior wall of the uterus.  Endometrium: Measures 2.8 cm in thickness at the level of the fundus; prominence of the endometrial echo complex is of uncertain significance, but may reflect the more distal process.  Right ovary:  Not visualized.  Left ovary: Not visualized.  Other findings: No free fluid seen within the pelvic cul-de-sac.  IMPRESSION:  1.  Marked distension of the lower uterine segment and cervix, which is filled with fluid and a complex mildly hyperechoic mass, measuring 7.8 x 2.4 x 2.8 cm.  Associated fluid-fluid level and  debris seen.  This is of uncertain etiology; malignancy cannot be excluded, though no associated blood flow is seen on limited Doppler evaluation. 2.  3.9 cm uterine fibroid seen.  Thickening of the endometrial echo complex may reflect the more distal process.  Ovaries not visualized due to the patient's habitus and overlying bowel gas.   Original Report Authenticated By: Tonia Ghent, M.D.   US Pelvis Complete  05/15/2013   *RADIOLOGY REPORT*  Clinical Data: Pelvic pain and cramping; vaginal bleeding.  TRANSABDOMINAL AND TRANSVAGINAL ULTRASOUND OF PELVIS Technique:  Both transabdominal and transvaginal ultrasound examinations of the pelvis were performed. Transabdominal technique was performed for global imaging of the pelvis including uterus, ovaries, adnexal regions, and pelvic cul-de-sac.  It was necessary to proceed with endovaginal exam following the transabdominal exam to visualize the uterus in greater detail.  Comparison:  None  Findings:  Uterus: Diffusely enlarged, measuring 16.1 x 7.6 x 8.8 cm.  This reflects marked distension of the lower uterine segment and cervix, which is filled with fluid and a complex mildly hyperechoic mass, measuring 7.8 x 2.4 x 2.8 cm.  No associated blood flow is seen on limited Doppler evaluation.  Associated fluid-fluid level and debris are seen.  There is also a 3.9 x 3.4 x 3.8 cm fibroid noted along the anterior wall of the uterus.  Endometrium: Measures 2.8 cm in thickness at the level of the fundus; prominence of the endometrial echo complex is of uncertain significance, but may reflect the more distal process.  Right ovary:  Not visualized.  Left ovary: Not visualized.  Other findings: No free fluid seen within the pelvic cul-de-sac.  IMPRESSION:  1.  Marked distension of the lower uterine segment and cervix, which is filled with fluid and a complex mildly hyperechoic mass, measuring 7.8 x 2.4 x 2.8 cm.  Associated fluid-fluid level and debris seen.  This is of  uncertain etiology; malignancy cannot be excluded, though no associated blood flow is seen on limited Doppler evaluation. 2.  3.9 cm uterine fibroid seen.  Thickening of the endometrial echo complex may reflect the more distal process.  Ovaries not visualized due to the  patient's habitus and overlying bowel gas.   Original Report Authenticated By: Tonia Ghent, M.D.    Pt updated on findings and plan.  Will need close f/u with GYN.  Kathryn Mackie, MD 05/15/13 [phone]

## 2013-05-16 LAB — GC/CHLAMYDIA PROBE AMP: CT Probe RNA: NEGATIVE

## 2013-07-30 ENCOUNTER — Other Ambulatory Visit: Payer: Self-pay | Admitting: Critical Care Medicine

## 2013-08-01 NOTE — Telephone Encounter (Signed)
Pt has been discharged from Pulmonary.

## 2013-09-18 ENCOUNTER — Emergency Department (HOSPITAL_COMMUNITY): Payer: Medicaid Other

## 2013-09-18 ENCOUNTER — Encounter (HOSPITAL_COMMUNITY): Payer: Self-pay | Admitting: Emergency Medicine

## 2013-09-18 ENCOUNTER — Emergency Department (HOSPITAL_COMMUNITY)
Admission: EM | Admit: 2013-09-18 | Discharge: 2013-09-18 | Disposition: A | Discharge status: Home / Self Care | Payer: Medicaid Other | Attending: Emergency Medicine | Admitting: Emergency Medicine

## 2013-09-18 DIAGNOSIS — J4489 Other specified chronic obstructive pulmonary disease: Secondary | ICD-10-CM | POA: Insufficient documentation

## 2013-09-18 DIAGNOSIS — J449 Chronic obstructive pulmonary disease, unspecified: Secondary | ICD-10-CM | POA: Insufficient documentation

## 2013-09-18 DIAGNOSIS — R109 Unspecified abdominal pain: Secondary | ICD-10-CM | POA: Insufficient documentation

## 2013-09-18 DIAGNOSIS — I509 Heart failure, unspecified: Secondary | ICD-10-CM | POA: Insufficient documentation

## 2013-09-18 DIAGNOSIS — IMO0002 Reserved for concepts with insufficient information to code with codable children: Secondary | ICD-10-CM | POA: Insufficient documentation

## 2013-09-18 DIAGNOSIS — Z87448 Personal history of other diseases of urinary system: Secondary | ICD-10-CM | POA: Insufficient documentation

## 2013-09-18 DIAGNOSIS — Z87891 Personal history of nicotine dependence: Secondary | ICD-10-CM | POA: Insufficient documentation

## 2013-09-18 DIAGNOSIS — Z79899 Other long term (current) drug therapy: Secondary | ICD-10-CM | POA: Insufficient documentation

## 2013-09-18 DIAGNOSIS — I10 Essential (primary) hypertension: Secondary | ICD-10-CM | POA: Insufficient documentation

## 2013-09-18 LAB — CBC WITH DIFFERENTIAL/PLATELET
Basophils Relative: 0 % (ref 0–1)
Hemoglobin: 10.9 g/dL — ABNORMAL LOW (ref 12.0–15.0)
Lymphs Abs: 2.6 10*3/uL (ref 0.7–4.0)
Monocytes Relative: 5 % (ref 3–12)
Neutro Abs: 5.8 10*3/uL (ref 1.7–7.7)
Neutrophils Relative %: 63 % (ref 43–77)
RBC: 4.51 MIL/uL (ref 3.87–5.11)

## 2013-09-18 LAB — BASIC METABOLIC PANEL
BUN: 16 mg/dL (ref 6–23)
Chloride: 102 mEq/L (ref 96–112)
GFR calc Af Amer: >90 mL/min (ref >90)
Glucose, Bld: 107 mg/dL — ABNORMAL HIGH (ref 70–99)
Potassium: 4.1 mEq/L (ref 3.5–5.1)
Sodium: 138 mEq/L (ref 135–145)

## 2013-09-18 LAB — URINALYSIS, ROUTINE W REFLEX MICROSCOPIC
Leukocytes, UA: NEGATIVE
Nitrite: NEGATIVE
Specific Gravity, Urine: 1.026 (ref 1.005–1.030)
pH: 7 (ref 5.0–8.0)

## 2013-09-18 MED ORDER — KETOROLAC TROMETHAMINE 60 MG/2ML IM SOLN
60.0000 mg | Freq: Once | INTRAMUSCULAR | Status: AC
  Administered 2013-09-18: 60 mg via INTRAMUSCULAR
  Filled 2013-09-18: qty 2

## 2013-09-18 MED ORDER — HYDROCODONE-ACETAMINOPHEN 5-325 MG PO TABS: 1.0000 | ORAL_TABLET | Freq: Four times a day (QID) | ORAL | Status: DC | PRN

## 2013-09-18 NOTE — ED Notes (Signed)
Pt was seen at her PCP for bladder infection and Pt completed antibiotics. Pt still complains of left flank pain but it has increased

## 2013-09-18 NOTE — ED Notes (Signed)
MD at bedside. 

## 2013-09-18 NOTE — ED Provider Notes (Signed)
CSN: [id number]     Arrival date & time 09/18/13  4098 History   First MD Initiated Contact with Patient 09/18/13 [phone]     Chief Complaint  Patient presents with  . Flank Pain   (Consider location/radiation/quality/duration/timing/severity/associated sxs/prior Treatment) Patient is a 52 y.o. female presenting with flank pain. The history is provided by the patient (pt complains of left flank and abd pain.  she has been treated for uti recently).  Flank Pain This is a recurrent problem. The current episode started more than 2 days ago. The problem occurs constantly. The problem has not changed since onset.Associated symptoms include abdominal pain. Pertinent negatives include no chest pain and no headaches. Nothing aggravates the symptoms. Nothing relieves the symptoms.    Past Medical History  Diagnosis Date  . COPD (chronic obstructive pulmonary disease)   . CHF (congestive heart failure)   . SOB (shortness of breath)   . Urinary incontinence   . Asthma   . Hypertension    Past Surgical History  Procedure Laterality Date  . Cesarean section    . Ectopic pregnancy surgery    . Knee surgery     Family History  Problem Relation Age of Onset  . Stroke Mother   . Asthma Mother    History  Substance Use Topics  . Smoking status: Former Smoker -- 1.50 packs/day for 30 years    Types: Cigarettes    Quit date: 10/27/2010  . Smokeless tobacco: Never Used  . Alcohol Use: No   OB History   Grav Para Term Preterm Abortions TAB SAB Ect Mult Living   5 4 4  1   1  4      Review of Systems  Constitutional: Negative for appetite change and fatigue.  HENT: Negative for congestion, ear discharge and sinus pressure.   Eyes: Negative for discharge.  Respiratory: Negative for cough.   Cardiovascular: Negative for chest pain.  Gastrointestinal: Positive for abdominal pain. Negative for diarrhea.  Genitourinary: Positive for flank pain. Negative for frequency and hematuria.   Musculoskeletal: Negative for back pain.  Skin: Negative for rash.  Neurological: Negative for seizures and headaches.  Psychiatric/Behavioral: Negative for hallucinations.    Allergies  Review of patient's allergies indicates no known allergies.  Home Medications   Current Outpatient Rx  Name  Route  Sig  Dispense  Refill  . albuterol (PROVENTIL) (2.5 MG/3ML) 0.083% nebulizer solution   Nebulization   Take 2.5 mg by nebulization every 6 (six) hours as needed for wheezing or shortness of breath.         Marland Kitchen amLODipine-olmesartan (AZOR) 5-40 MG per tablet   Oral   Take 1 tablet by mouth daily.          . beclomethasone (QVAR) 40 MCG/ACT inhaler   Inhalation   Inhale 1 puff into the lungs daily.          . naproxen (NAPROSYN) 500 MG tablet   Oral   Take 500 mg by mouth 2 (two) times daily as needed for mild pain or moderate pain.          . traMADol (ULTRAM) 50 MG tablet   Oral   Take 50 mg by mouth every 6 (six) hours as needed.         Marland Kitchen EXPIRED: albuterol (PROVENTIL) (2.5 MG/3ML) 0.083% nebulizer solution   Nebulization   Take 3 mLs (2.5 mg total) by nebulization 2 (two) times daily. DX:  496   120 mL  11   . EXPIRED: beclomethasone (QVAR) 40 MCG/ACT inhaler   Inhalation   Inhale 2 puffs into the lungs 2 (two) times daily.         Marland Kitchen HYDROcodone-acetaminophen (NORCO/VICODIN) 5-325 MG per tablet   Oral   Take 1 tablet by mouth every 6 (six) hours as needed for moderate pain.   30 tablet   0    BP 153/96  Pulse 92  Temp(Src) 98.1 F (36.7 C) (Oral)  Resp 20  SpO2 94% Physical Exam  Constitutional: She is oriented to person, place, and time. She appears well-developed.  HENT:  Head: Normocephalic.  Eyes: Conjunctivae and EOM are normal. No scleral icterus.  Neck: Neck supple. No thyromegaly present.  Cardiovascular: Normal rate and regular rhythm.  Exam reveals no gallop and no friction rub.   No murmur heard. Pulmonary/Chest: No stridor. She  has no wheezes. She has no rales. She exhibits no tenderness.  Abdominal: She exhibits no distension. There is tenderness. There is no rebound.  Genitourinary:  Left flank pain  Musculoskeletal: Normal range of motion. She exhibits no edema.  Lymphadenopathy:    She has no cervical adenopathy.  Neurological: She is oriented to person, place, and time. She exhibits normal muscle tone. Coordination normal.  Skin: No rash noted. No erythema.  Psychiatric: She has a normal mood and affect. Her behavior is normal.    ED Course  Procedures (including critical care time) Labs Review Labs Reviewed  URINALYSIS, ROUTINE W REFLEX MICROSCOPIC - Abnormal; Notable for the following:    APPearance CLOUDY (*)    All other components within normal limits  CBC WITH DIFFERENTIAL - Abnormal; Notable for the following:    Hemoglobin 10.9 (*)    HCT 35.4 (*)    MCH 24.2 (*)    RDW 17.8 (*)    Platelets 404 (*)    All other components within normal limits  BASIC METABOLIC PANEL - Abnormal; Notable for the following:    Glucose, Bld 107 (*)    All other components within normal limits  URINE CULTURE   Imaging Review Ct Abdomen Pelvis Wo Contrast  09/18/2013   CLINICAL DATA:  Left-sided flank pain and hematuria.  EXAM: CT ABDOMEN AND PELVIS WITHOUT  TECHNIQUE: Multidetector CT imaging of the abdomen and pelvis was performed following the standard protocol without IV contrast.  COMPARISON:  Pelvic ultrasound on 05/15/2013.  FINDINGS: There is no evidence of renal obstruction, renal calculi, ureteral calculi or bladder abnormality. Unenhanced appearance of the liver, gallbladder, pancreas, spleen, adrenal glands and bowel are unremarkable.  The uterus is enlarged with underlying fibroid disease suspected. There is some thickening of the endometrium versus fluid in the endometrial canal. These findings were also identified by prior pelvic ultrasound. No free fluid is identified in the pelvis.  No enlarged  lymph nodes are identified. There is no evidence of hernia. Bony structures show mild degenerative disc disease of the lower lumbar spine.  IMPRESSION: No calculi or renal obstruction identified. Enlargement of the uterus with suspected uterine fibroid disease. There is associated thickening of the endometrium versus fluid in the endometrial canal.   Electronically Signed   By: Irish Lack M.D.   On: 09/18/2013 09:45    EKG Interpretation   None       MDM  Uterine fibroids will follow up with ob-gyn in 2-3 weeks 1. Abdominal pain        Benny Lennert, MD 09/18/13 1101

## 2013-09-19 LAB — URINE CULTURE: Colony Count: 55000

## 2013-11-02 ENCOUNTER — Inpatient Hospital Stay (HOSPITAL_COMMUNITY)
Admission: EM | Admit: 2013-11-02 | Discharge: 2013-11-07 | DRG: 193 | Disposition: A | Discharge status: Home / Self Care | Payer: Medicaid Other | Attending: Internal Medicine | Admitting: Internal Medicine

## 2013-11-02 ENCOUNTER — Emergency Department (HOSPITAL_COMMUNITY): Payer: Medicaid Other

## 2013-11-02 ENCOUNTER — Encounter (HOSPITAL_COMMUNITY): Payer: Self-pay | Admitting: Emergency Medicine

## 2013-11-02 DIAGNOSIS — Z87891 Personal history of nicotine dependence: Secondary | ICD-10-CM

## 2013-11-02 DIAGNOSIS — R0902 Hypoxemia: Secondary | ICD-10-CM | POA: Diagnosis present

## 2013-11-02 DIAGNOSIS — R509 Fever, unspecified: Secondary | ICD-10-CM | POA: Diagnosis present

## 2013-11-02 DIAGNOSIS — J449 Chronic obstructive pulmonary disease, unspecified: Secondary | ICD-10-CM

## 2013-11-02 DIAGNOSIS — Z823 Family history of stroke: Secondary | ICD-10-CM

## 2013-11-02 DIAGNOSIS — K5900 Constipation, unspecified: Secondary | ICD-10-CM | POA: Diagnosis present

## 2013-11-02 DIAGNOSIS — E119 Type 2 diabetes mellitus without complications: Secondary | ICD-10-CM | POA: Diagnosis present

## 2013-11-02 DIAGNOSIS — J9620 Acute and chronic respiratory failure, unspecified whether with hypoxia or hypercapnia: Secondary | ICD-10-CM

## 2013-11-02 DIAGNOSIS — Z9981 Dependence on supplemental oxygen: Secondary | ICD-10-CM

## 2013-11-02 DIAGNOSIS — J45901 Unspecified asthma with (acute) exacerbation: Secondary | ICD-10-CM

## 2013-11-02 DIAGNOSIS — I10 Essential (primary) hypertension: Secondary | ICD-10-CM | POA: Diagnosis present

## 2013-11-02 DIAGNOSIS — J441 Chronic obstructive pulmonary disease with (acute) exacerbation: Secondary | ICD-10-CM | POA: Diagnosis present

## 2013-11-02 DIAGNOSIS — J189 Pneumonia, unspecified organism: Principal | ICD-10-CM | POA: Diagnosis present

## 2013-11-02 DIAGNOSIS — R739 Hyperglycemia, unspecified: Secondary | ICD-10-CM

## 2013-11-02 DIAGNOSIS — Z825 Family history of asthma and other chronic lower respiratory diseases: Secondary | ICD-10-CM

## 2013-11-02 DIAGNOSIS — I509 Heart failure, unspecified: Secondary | ICD-10-CM | POA: Diagnosis present

## 2013-11-02 LAB — BASIC METABOLIC PANEL
BUN: 15 mg/dL (ref 6–23)
CHLORIDE: 100 meq/L (ref 96–112)
CO2: 28 mEq/L (ref 19–32)
Calcium: 8.9 mg/dL (ref 8.4–10.5)
Creatinine, Ser: 0.68 mg/dL (ref 0.50–1.10)
GFR calc Af Amer: >90 mL/min (ref >90)
GFR calc non Af Amer: >90 mL/min (ref >90)
Glucose, Bld: 109 mg/dL — ABNORMAL HIGH (ref 70–99)
POTASSIUM: 3.8 meq/L (ref 3.7–5.3)
Sodium: 140 mEq/L (ref 137–147)

## 2013-11-02 LAB — CBC
HEMATOCRIT: 34.8 % — AB (ref 36.0–46.0)
HEMOGLOBIN: 10.8 g/dL — AB (ref 12.0–15.0)
MCH: 24.6 pg — ABNORMAL LOW (ref 26.0–34.0)
MCHC: 31 g/dL (ref 30.0–36.0)
MCV: 79.3 fL (ref 78.0–100.0)
Platelets: 388 10*3/uL (ref 150–400)
RBC: 4.39 MIL/uL (ref 3.87–5.11)
RDW: 17.1 % — ABNORMAL HIGH (ref 11.5–15.5)
WBC: 9.8 10*3/uL (ref 4.0–10.5)

## 2013-11-02 LAB — POCT I-STAT TROPONIN I
Troponin i, poc: 0.01 ng/mL (ref 0.00–0.08)
Troponin i, poc: 0.01 ng/mL (ref 0.00–0.08)

## 2013-11-02 LAB — INFLUENZA PANEL BY PCR (TYPE A & B)
H1N1 flu by pcr: NOT DETECTED
INFLBPCR: NEGATIVE
Influenza A By PCR: NEGATIVE

## 2013-11-02 LAB — PRO B NATRIURETIC PEPTIDE: Pro B Natriuretic peptide (BNP): 52 pg/mL (ref 0–125)

## 2013-11-02 MED ORDER — LEVOFLOXACIN IN D5W 750 MG/150ML IV SOLN
750.0000 mg | INTRAVENOUS | Status: DC
  Administered 2013-11-02 – 2013-11-03 (×2): 750 mg via INTRAVENOUS
  Filled 2013-11-02 (×2): qty 150

## 2013-11-02 MED ORDER — ENOXAPARIN SODIUM 40 MG/0.4ML ~~LOC~~ SOLN: 40.0000 mg | SUBCUTANEOUS | Status: DC

## 2013-11-02 MED ORDER — PREDNISONE 20 MG PO TABS
60.0000 mg | ORAL_TABLET | Freq: Once | ORAL | Status: AC
  Administered 2013-11-02: 60 mg via ORAL
  Filled 2013-11-02: qty 3

## 2013-11-02 MED ORDER — ALBUTEROL SULFATE (2.5 MG/3ML) 0.083% IN NEBU
2.5000 mg | INHALATION_SOLUTION | Freq: Four times a day (QID) | RESPIRATORY_TRACT | Status: DC | PRN
  Administered 2013-11-02 – 2013-11-06 (×7): 2.5 mg via RESPIRATORY_TRACT
  Filled 2013-11-02 (×8): qty 3

## 2013-11-02 MED ORDER — AMLODIPINE BESYLATE 5 MG PO TABS
5.0000 mg | ORAL_TABLET | Freq: Every day | ORAL | Status: DC
  Administered 2013-11-02 – 2013-11-04 (×3): 5 mg via ORAL
  Filled 2013-11-02 (×3): qty 1

## 2013-11-02 MED ORDER — SODIUM CHLORIDE 0.9 % IJ SOLN
3.0000 mL | Freq: Two times a day (BID) | INTRAMUSCULAR | Status: DC
  Administered 2013-11-02 – 2013-11-06 (×8): 3 mL via INTRAVENOUS

## 2013-11-02 MED ORDER — ENOXAPARIN SODIUM 60 MG/0.6ML ~~LOC~~ SOLN
60.0000 mg | SUBCUTANEOUS | Status: DC
  Administered 2013-11-02 – 2013-11-06 (×5): 60 mg via SUBCUTANEOUS
  Filled 2013-11-02 (×6): qty 0.6

## 2013-11-02 MED ORDER — ALBUTEROL SULFATE (2.5 MG/3ML) 0.083% IN NEBU
5.0000 mg | INHALATION_SOLUTION | Freq: Once | RESPIRATORY_TRACT | Status: AC
  Administered 2013-11-02: 5 mg via RESPIRATORY_TRACT

## 2013-11-02 MED ORDER — LEVOFLOXACIN 500 MG PO TABS
750.0000 mg | ORAL_TABLET | Freq: Once | ORAL | Status: AC
  Administered 2013-11-02: 750 mg via ORAL
  Filled 2013-11-02: qty 2

## 2013-11-02 MED ORDER — IRBESARTAN 300 MG PO TABS
300.0000 mg | ORAL_TABLET | Freq: Every day | ORAL | Status: DC
  Administered 2013-11-02 – 2013-11-07 (×6): 300 mg via ORAL
  Filled 2013-11-02 (×6): qty 1

## 2013-11-02 MED ORDER — SODIUM CHLORIDE 0.9 % IV SOLN: 250.0000 mL | INTRAVENOUS | Status: DC | PRN

## 2013-11-02 MED ORDER — HYDROCODONE-ACETAMINOPHEN 5-325 MG PO TABS
1.0000 | ORAL_TABLET | Freq: Four times a day (QID) | ORAL | Status: DC | PRN
  Administered 2013-11-02 – 2013-11-06 (×5): 1 via ORAL
  Filled 2013-11-02 (×5): qty 1

## 2013-11-02 MED ORDER — IPRATROPIUM-ALBUTEROL 0.5-2.5 (3) MG/3ML IN SOLN
3.0000 mL | Freq: Once | RESPIRATORY_TRACT | Status: AC
  Administered 2013-11-02: 3 mL via RESPIRATORY_TRACT
  Filled 2013-11-02: qty 3

## 2013-11-02 MED ORDER — METHYLPREDNISOLONE SODIUM SUCC 40 MG IJ SOLR
40.0000 mg | Freq: Two times a day (BID) | INTRAMUSCULAR | Status: DC
  Administered 2013-11-02 – 2013-11-03 (×2): 40 mg via INTRAVENOUS
  Filled 2013-11-02 (×4): qty 1

## 2013-11-02 MED ORDER — AMLODIPINE-OLMESARTAN 5-40 MG PO TABS: 1.0000 | ORAL_TABLET | Freq: Every day | ORAL | Status: DC

## 2013-11-02 MED ORDER — SODIUM CHLORIDE 0.9 % IJ SOLN: 3.0000 mL | INTRAMUSCULAR | Status: DC | PRN

## 2013-11-02 MED ORDER — IPRATROPIUM BROMIDE 0.02 % IN SOLN
0.5000 mg | Freq: Once | RESPIRATORY_TRACT | Status: AC
  Administered 2013-11-02: 0.5 mg via RESPIRATORY_TRACT

## 2013-11-02 MED ORDER — AMLODIPINE BESYLATE 5 MG PO TABS
5.0000 mg | ORAL_TABLET | Freq: Once | ORAL | Status: AC
  Administered 2013-11-02: 5 mg via ORAL
  Filled 2013-11-02: qty 1

## 2013-11-02 MED ORDER — IPRATROPIUM-ALBUTEROL 0.5-2.5 (3) MG/3ML IN SOLN: 3.0000 mL | Freq: Once | RESPIRATORY_TRACT | Status: DC

## 2013-11-02 MED ORDER — INFLUENZA VAC SPLIT QUAD 0.5 ML IM SUSP
0.5000 mL | INTRAMUSCULAR | Status: DC | PRN
  Filled 2013-11-02: qty 0.5

## 2013-11-02 NOTE — ED Notes (Signed)
Patient frequently taking BP cuff off, heart monitor and pulse ox. Writer has been in the room several times and placed back on and reemphasized the importance of needing to monitor her. Patient would laugh afterwards.

## 2013-11-02 NOTE — Progress Notes (Signed)
UR completed 

## 2013-11-02 NOTE — ED Notes (Signed)
Patient has been talking on her cell phone frequently.

## 2013-11-02 NOTE — ED Notes (Signed)
Ambulated patient with 2L O2.   Patient desat to 83%. BP 193/114  HR 117

## 2013-11-02 NOTE — H&P (Signed)
Chief Complaint:  sob  HPI: 53 yo female with h/o htn, copd, chf comes in with 2 days of cough, sob, wheezing, subj fever/chills.  No n/v/d.  Is on chronic oxygen 2 L only prn at home.  Nonproductive cough.  No nasal congestion.  No malaise.  No le edema or swelling.  No recent illnesses or abx use.  Ran out of her bp meds over a week ago, and thinks she also ran out of her oxygen from her tank.  Review of Systems:  Positive and negative as per HPI otherwise all other systems are negative  Past Medical History: Past Medical History  Diagnosis Date  . COPD (chronic obstructive pulmonary disease)   . CHF (congestive heart failure)   . SOB (shortness of breath)   . Urinary incontinence   . Asthma   . Hypertension    Past Surgical History  Procedure Laterality Date  . Cesarean section    . Ectopic pregnancy surgery    . Knee surgery      Medications: Prior to Admission medications   Medication Sig Start Date End Date Taking? Authorizing Provider  amLODipine-olmesartan (AZOR) 5-40 MG per tablet Take 1 tablet by mouth daily.  09/09/13  Yes Historical Provider, MD  beclomethasone (QVAR) 40 MCG/ACT inhaler Inhale 1 puff into the lungs daily.  08/04/13  Yes Historical Provider, MD  albuterol (PROVENTIL) (2.5 MG/3ML) 0.083% nebulizer solution Take 2.5 mg by nebulization every 6 (six) hours as needed for wheezing or shortness of breath.    Historical Provider, MD  HYDROcodone-acetaminophen (NORCO/VICODIN) 5-325 MG per tablet Take 1 tablet by mouth every 6 (six) hours as needed for moderate pain. 09/18/13   Maudry Diego, MD  traMADol (ULTRAM) 50 MG tablet Take 50 mg by mouth every 6 (six) hours as needed.    Historical Provider, MD    Allergies:  No Known Allergies  Social History:  reports that she quit smoking about 3 years ago. Her smoking use included Cigarettes. She has a 45 pack-year smoking history. She has never used smokeless tobacco. She reports that she does not drink  alcohol or use illicit drugs.  Family History: Family History  Problem Relation Age of Onset  . Stroke Mother   . Asthma Mother     Physical Exam: Filed Vitals:   11/02/13 0135 11/02/13 0230 11/02/13 0300 11/02/13 0336  BP: 207/107 171/91 197/136   Pulse: 106 103 102   Temp: 98.1 F (36.7 C)     TempSrc: Oral     Resp:      SpO2: 96% 98% 96% 85%   General appearance: alert, cooperative and no distress Head: Normocephalic, without obvious abnormality, atraumatic Eyes: negative Nose: Nares normal. Septum midline. Mucosa normal. No drainage or sinus tenderness. Neck: no JVD and supple, symmetrical, trachea midline Lungs: clear to auscultation bilaterally Heart: regular rate and rhythm, S1, S2 normal, no murmur, click, rub or gallop Abdomen: soft, non-tender; bowel sounds normal; no masses,  no organomegaly Extremities: extremities normal, atraumatic, no cyanosis or edema Pulses: 2+ and symmetric Skin: Skin color, texture, turgor normal. No rashes or lesions Neurologic: Grossly normal    Labs on Admission:   Recent Labs  11/02/13 0149  NA 140  K 3.8  CL 100  CO2 28  GLUCOSE 109*  BUN 15  CREATININE 0.68  CALCIUM 8.9    Recent Labs  11/02/13 0149  WBC 9.8  HGB 10.8*  HCT 34.8*  MCV 79.3  PLT 388   Radiological  Exams on Admission: Dg Chest 2 View (if Patient Has Fever And/or Copd)  11/02/2013   CLINICAL DATA:  Shortness of breath  EXAM: CHEST  2 VIEW  COMPARISON:  08/23/2012  FINDINGS: Cardiomegaly which is chronic. There is diffuse interstitial opacity with some airspace type opacities at the right base. No effusion or pneumothorax.  IMPRESSION: 1. Right basilar infiltrate, primarily concerning for pneumonia. 2. Pulmonary venous congestion.   Electronically Signed   By: Jorje Guild M.D.   On: 11/02/2013 02:54    Assessment/Plan  53 yo female with pna and h/o copd/chf/htn  Principal Problem:   PNA (pneumonia)- pna pathway, place on levaquin.  Oxygen  dropped to 80s on ambulation on 2 Liters [place].  Admit due to worsening hypoxemia with higher oxygen demands.  No wheezing now on exam, much improved with albuterol neb, will hold off on any further steroids at this time and cont freq nebs.  Active Problems:   Hypertension uncontrolled, resume her home medications will need refill at d/c   COPD exacerbation  Mild and mostly resolved with neb, tx pna, hold steroids for now   Hypoxia as above    Noelle Hoogland A 11/02/2013, 5:21 AM

## 2013-11-02 NOTE — Progress Notes (Signed)
ANTIBIOTIC CONSULT NOTE - INITIAL  Pharmacy Consult for Levaquin Indication: pneumonia  No Known Allergies  Patient Measurements: Height: 5\' 6"  (167.6 cm) Weight: 284 lb (128.822 kg) IBW/kg (Calculated) : 59.3  Vital Signs: Temp: 98.2 F (36.8 C) (01/07 1350) Temp src: Oral (01/07 1350) BP: 179/112 mmHg (01/07 1350) Pulse Rate: 101 (01/07 1350) Intake/Output from previous day:   Intake/Output from this shift:    Labs:  Recent Labs  11/02/13 0149  WBC 9.8  HGB 10.8*  PLT 388  CREATININE 0.68   Estimated Creatinine Clearance: 110.5 ml/min (by C-G formula based on Cr of 0.68). No results found for this basename: VANCOTROUGH, VANCOPEAK, VANCORANDOM, GENTTROUGH, GENTPEAK, GENTRANDOM, TOBRATROUGH, TOBRAPEAK, TOBRARND, AMIKACINPEAK, AMIKACINTROU, AMIKACIN,  in the last 72 hours   Microbiology: No results found for this or any previous visit (from the past 720 hour(s)).  Medical History: Past Medical History  Diagnosis Date  . COPD (chronic obstructive pulmonary disease)   . CHF (congestive heart failure)   . SOB (shortness of breath)   . Urinary incontinence   . Asthma   . Hypertension    Medications:  Scheduled:  . amLODipine  5 mg Oral Daily   And  . irbesartan  300 mg Oral Daily  . enoxaparin (LOVENOX) injection  60 mg Subcutaneous Q24H  . levofloxacin (LEVAQUIN) IV  750 mg Intravenous Q24H  . sodium chloride  3 mL Intravenous Q12H   Anti-infectives   Start     Dose/Rate Route Frequency Ordered Stop   11/02/13 1200  levofloxacin (LEVAQUIN) IVPB 750 mg     750 mg 100 mL/hr over 90 Minutes Intravenous Every 24 hours 11/02/13 1156 11/07/13 1159   11/02/13 0330  levofloxacin (LEVAQUIN) tablet 750 mg     750 mg Oral  Once 11/02/13 0318 11/02/13 0339     Assessment: 49 yoF with acute on chronic respiratory failure. Hx of COPD, continued tobacco use (25 pack yr), home O2 2L per [place].   Admit for treatment of respiratory failure.  Renal function  wnl  Patient received one dose Levaquin 750mg  po ~ 3:30am in ED, then additional Levaquin 750mg  IV started in ED ~ 12N.   Levaquin ordered as 750mg  IV q24 x 5 days 1/7-1/11  Goal of Therapy:  Antibiotic dose/schedule appropriate for renal function, source of infection.  Plan:  No change to Levaquin ordered 750mg  IV q24 x 5 doses  Minda Ditto PharmD Pager [phone] 11/02/2013, 2:29 PM

## 2013-11-02 NOTE — ED Notes (Signed)
Patient took oxygen off and when told she needed her oxygen on because her sats were 87%, patient stated, "I'm not going to be wearing oxygen more than at night. I ain't about that." Explained that she needed it now and maybe in a day or two she would noeed to wear the oxygen during the day. Patient Biochemist, clinical replace oxygen tubing.

## 2013-11-02 NOTE — Progress Notes (Signed)
   CARE MANAGEMENT ED NOTE 11/02/2013  Patient:  Kathryn Mahoney,Kathryn Mahoney   Account Number:  [id number]  Date Initiated:  11/02/2013  Documentation initiated by:  Jackelyn Poling  Subjective/Objective Assessment:   53 yr old medicaid [place] access [place] pt (originally from [place]) confirms her pcp is Dr [name] (she also has & continues to see Dr York Ram out of pocket for various treatment)     Subjective/Objective Assessment Detail:   [facility] near [place] [address] is choice of pharmacy Pt confirms her home oxygen for "three years"  is from [facility]. She has the contact number on the tank per pt but  has not contacted them to check it Reports she believes "it ran out or there is something wrong with the thingy" Pt requested CM to be her CM (see below note) Pt states her medication issues are related to cost for the "experimental medicines" provided to her that medicaid does not pay for. CM was shown a box of Azor samples she states is provided by pcp Reports previous "trial" use of Tribenzor     Action/Plan:   CM reviewed EPIC notes CM spoke with pt EPIC updated Pt redirected to DSS or her pcp office for a community case manager Pt encouraged to always contact [facility] when she thinks her DME is damaged or not working properly   Action/Plan Detail:   CM called  Kristen of [facility] about the pt's voiced concern with her oxygen Request pt be followed during her admission Cm encouraged pt to access [email or web address] & provided her with a list C   Anticipated DC Date:  11/05/2013     Status Recommendation to Physician:   Result of Recommendation:    Other ED [place]  Other  PCP issues  Outpatient Services - Pt will follow up  Medication Assistance    Choice offered to / List presented to:            Status of service:  Completed, signed off  ED Comments:   ED Comments Detail:  11/02/13  1214 Cm encouraged pt to access [email or web address] & provided her with a copy of the CV, Respiratory and antibiotics medications sections of the [place] Medicaid and Health Choice Preferred Drug List (PDL)Effective October 27, 2013 Encouraged her to speak with the [facility] attending MD and her pcp about providing medications from preferred list for cost efficiency She is not eligible per [facility] MATCH program criteria because she has active  medicaid coverage Discussed possible change from inhalers to nebulizer treatments if possible for cost efficiency Pt confirms she has a nebulizer at home

## 2013-11-02 NOTE — ED Notes (Signed)
Attempted to obtain labs.  Unsuccessful x2, RN aware.

## 2013-11-02 NOTE — ED Notes (Signed)
Respiratory at bedside.

## 2013-11-02 NOTE — Progress Notes (Signed)
[facility] PROGRESS NOTE  Kathryn Mahoney QZE:[id number] DOB: Dec 09, 1960 DOA: 11/02/2013 PCP: Provider Not In System  Assessment/Plan: Acute on chronic respiratory failure -Patient is usually maintained on 2 L nasal cannula at night at home -Secondary to pneumonia and COPD exacerbation -pt initially wanted transfer to [facility] I spent 45 min on the telephone with transfer center, pt changed her mind and wanted to stay at [facility] COPD exacerbation -Continue Solu-Medrol -Aerosolized albuterol and Atrovent CAP -continue levofloxacin -Urine Legionella antigen -Urine Streptococcus pneumoniae antigen Hypertension  -Patient has not taken any hypertensive medications since Christmastime  -Restart Azor -pt states Medicaid did not pay for Azor Tobacco abuse -cessation discussed -25pk yrs  Family Communication:   daughter at beside Disposition Plan:   Home when medically stable  Antibiotics:  Levofloxacin 11/02/13>>>    Procedures/Studies: Dg Chest 2 View (if Patient Has Fever And/or Copd)  11/02/2013   CLINICAL DATA:  Shortness of breath  EXAM: CHEST  2 VIEW  COMPARISON:  08/23/2012  FINDINGS: Cardiomegaly which is chronic. There is diffuse interstitial opacity with some airspace type opacities at the right base. No effusion or pneumothorax.  IMPRESSION: 1. Right basilar infiltrate, primarily concerning for pneumonia. 2. Pulmonary venous congestion.   Electronically Signed   By: Jorje Guild M.D.   On: 11/02/2013 02:54         Subjective: Patient remained low but better today. Denies any fevers, chills, chest discomfort, nausea, vomiting, diarrhea, abdominal pain, dysuria, hematuria. She endorsed dark-colored urine.   Objective: Filed Vitals:   11/02/13 0300 11/02/13 0336 11/02/13 0500 11/02/13 0533  BP: 197/136  148/117 174/95  Pulse: 102  114   Temp:      TempSrc:      Resp:   21 20  SpO2: 96% 85% 97%    No intake or output data in the 24 hours  ending 11/02/13 0941 Weight change:  Exam:   General:  Pt is alert, follows commands appropriately, not in acute distress  HEENT: No icterus, No thrush,  [place]/AT  Cardiovascular: RRR, S1/S2, no rubs, no gallops  Respiratory:Bibasilar crackles. Bibasilar wheezing. Good air movement.  Abdomen: Soft/+BS, non tender, non distended, no guarding  Extremities: 1+LE edema, No lymphangitis, No petechiae, No rashes, no synovitis  Data Reviewed: Basic Metabolic Panel:  Recent Labs Lab 11/02/13 0149  NA 140  K 3.8  CL 100  CO2 28  GLUCOSE 109*  BUN 15  CREATININE 0.68  CALCIUM 8.9   Liver Function Tests: No results found for this basename: AST, ALT, ALKPHOS, BILITOT, PROT, ALBUMIN,  in the last 168 hours No results found for this basename: LIPASE, AMYLASE,  in the last 168 hours No results found for this basename: AMMONIA,  in the last 168 hours CBC:  Recent Labs Lab 11/02/13 0149  WBC 9.8  HGB 10.8*  HCT 34.8*  MCV 79.3  PLT 388   Cardiac Enzymes: No results found for this basename: CKTOTAL, CKMB, CKMBINDEX, TROPONINI,  in the last 168 hours BNP: No components found with this basename: POCBNP,  CBG: No results found for this basename: GLUCAP,  in the last 168 hours  No results found for this or any previous visit (from the past 240 hour(s)).   Scheduled Meds: Continuous Infusions:   Keisi Eckford, DO  [facility] Pager [phone]  If 7PM-7AM, please contact night-coverage [email or web address] Password [email or web address] 11/02/2013, 9:41 AM   LOS: 0 days

## 2013-11-02 NOTE — ED Notes (Addendum)
Pt from home c/o shortness of breath for a few days. She reports having body aches, fever and chills last week. She has a productive cough with clear sputum. History of COPD. She has been out of her Bp meds since Christmas. She is  Hypertensive today.

## 2013-11-02 NOTE — ED Provider Notes (Signed)
CSN: [id number]     Arrival date & time 11/02/13  0124 History   First MD Initiated Contact with Patient 11/02/13 0132     Chief Complaint  Patient presents with  . Shortness of Breath   (Consider location/radiation/quality/duration/timing/severity/associated sxs/prior Treatment) HPI  This is a 15 are old female with a history of COPD, CHF who presents with shortness of breath.  Patient reports a 2 to 3 day history of progressive shortness of breath. She uses an inhaler and is on 1 1/2-2 L of oxygen at home at baseline. She reports chills last Friday but no documented fevers. She reports a cough productive of clear sputum.  Patient also reports increasing blood pressures. She has not taken her blood pressure medicines since the day after Christmas.  Patient denies any chest pain. She denies any increasing lower extremity swelling.  Past Medical History  Diagnosis Date  . COPD (chronic obstructive pulmonary disease)   . CHF (congestive heart failure)   . SOB (shortness of breath)   . Urinary incontinence   . Asthma   . Hypertension    Past Surgical History  Procedure Laterality Date  . Cesarean section    . Ectopic pregnancy surgery    . Knee surgery     Family History  Problem Relation Age of Onset  . Stroke Mother   . Asthma Mother    History  Substance Use Topics  . Smoking status: Former Smoker -- 1.50 packs/day for 30 years    Types: Cigarettes    Quit date: 10/27/2010  . Smokeless tobacco: Never Used  . Alcohol Use: No   OB History   Grav Para Term Preterm Abortions TAB SAB Ect Mult Living   5 4 4  1   1  4      Review of Systems  Constitutional: Positive for chills. Negative for fever.  Respiratory: Positive for cough, shortness of breath and wheezing. Negative for chest tightness.   Cardiovascular: Negative for chest pain and leg swelling.  Gastrointestinal: Negative for nausea, vomiting and abdominal pain.  Genitourinary: Negative for dysuria.   Musculoskeletal: Negative for back pain.  Neurological: Negative for headaches.  Psychiatric/Behavioral: Negative for confusion.  All other systems reviewed and are negative.    Allergies  Review of patient's allergies indicates no known allergies.  Home Medications   Current Outpatient Rx  Name  Route  Sig  Dispense  Refill  . amLODipine-olmesartan (AZOR) 5-40 MG per tablet   Oral   Take 1 tablet by mouth daily.          . beclomethasone (QVAR) 40 MCG/ACT inhaler   Inhalation   Inhale 1 puff into the lungs daily.          Marland Kitchen albuterol (PROVENTIL) (2.5 MG/3ML) 0.083% nebulizer solution   Nebulization   Take 2.5 mg by nebulization every 6 (six) hours as needed for wheezing or shortness of breath.         Marland Kitchen HYDROcodone-acetaminophen (NORCO/VICODIN) 5-325 MG per tablet   Oral   Take 1 tablet by mouth every 6 (six) hours as needed for moderate pain.   30 tablet   0   . traMADol (ULTRAM) 50 MG tablet   Oral   Take 50 mg by mouth every 6 (six) hours as needed.          BP 197/136  Pulse 102  Temp(Src) 98.1 F (36.7 C) (Oral)  Resp 22  SpO2 85% Physical Exam  Nursing note and vitals reviewed. Constitutional:  She is oriented to person, place, and time. No distress.  OBese  HENT:  Head: Normocephalic and atraumatic.  Mouth/Throat: Oropharynx is clear and moist.  Eyes: Pupils are equal, round, and reactive to light.  Neck: Neck supple.  Cardiovascular: Regular rhythm and normal heart sounds.   Tachycardia  Pulmonary/Chest: Effort normal. No respiratory distress. She has wheezes.  Fair movement, diffuse expiratory wheezing  Abdominal: Soft. Bowel sounds are normal. There is no tenderness.  Musculoskeletal:  1+ bilateral lower extremity edema  Neurological: She is alert and oriented to person, place, and time.  Skin: Skin is warm and dry.  Psychiatric: She has a normal mood and affect.    ED Course  Procedures (including critical care time) Labs  Review Labs Reviewed  BASIC METABOLIC PANEL - Abnormal; Notable for the following:    Glucose, Bld 109 (*)    All other components within normal limits  CBC - Abnormal; Notable for the following:    Hemoglobin 10.8 (*)    HCT 34.8 (*)    MCH 24.6 (*)    RDW 17.1 (*)    All other components within normal limits  CULTURE, BLOOD (ROUTINE X 2)  CULTURE, BLOOD (ROUTINE X 2)  PRO B NATRIURETIC PEPTIDE  INFLUENZA PANEL BY PCR  POCT I-STAT TROPONIN I  POCT I-STAT TROPONIN I   Imaging Review Dg Chest 2 View (if Patient Has Fever And/or Copd)  11/02/2013   CLINICAL DATA:  Shortness of breath  EXAM: CHEST  2 VIEW  COMPARISON:  08/23/2012  FINDINGS: Cardiomegaly which is chronic. There is diffuse interstitial opacity with some airspace type opacities at the right base. No effusion or pneumothorax.  IMPRESSION: 1. Right basilar infiltrate, primarily concerning for pneumonia. 2. Pulmonary venous congestion.   Electronically Signed   By: Jorje Guild M.D.   On: 11/02/2013 02:54    EKG Interpretation    Date/Time:  Wednesday November 02 2013 01:37:53 EST Ventricular Rate:  106 PR Interval:  156 QRS Duration: 106 QT Interval:  385 QTC Calculation: 511 R Axis:   69 Text Interpretation:  Sinus tachycardia Prolonged QT interval Confirmed by Elania Crowl  MD, Annet Manukyan ([id number]) on 11/02/2013 3:28:03 AM            MDM   1. COPD exacerbation   2. Hypoxia   3. COPD (chronic obstructive pulmonary disease)   4. Hypertension   5. PNA (pneumonia)    Patient presents with 2 to three-day history of shortness of breath. Noted to be hypoxic room-air to 70%. She does use oxygen at home. She is comfortable on 2 L and is maintaining oxygen saturations. She is noted to be hypertensive on admission. She has expiratory wheezing. Patient was given 2 nebs, steroids for presumed COPD exacerbation. Chest x-ray is concerning for pneumonia. Patient given Levaquin.  On repeat examination, patient had improvement of  aeration. She is given a second duo neb with clearing of wheezing. Patient would like to be discharged home. However, patient was ambulated with 2 L of oxygen and dropped her oxygen saturations to 80%.  Given new oxygen requirement in the setting of COPD and pneumonia, will admit to medicine.    Merryl Hacker, MD 11/02/13 [phone]

## 2013-11-03 LAB — LEGIONELLA ANTIGEN, URINE: Legionella Antigen, Urine: NEGATIVE

## 2013-11-03 LAB — BASIC METABOLIC PANEL
BUN: 16 mg/dL (ref 6–23)
CO2: 30 mEq/L (ref 19–32)
CREATININE: 0.68 mg/dL (ref 0.50–1.10)
Calcium: 10 mg/dL (ref 8.4–10.5)
Chloride: 98 mEq/L (ref 96–112)
Glucose, Bld: 144 mg/dL — ABNORMAL HIGH (ref 70–99)
Potassium: 4 mEq/L (ref 3.7–5.3)
SODIUM: 139 meq/L (ref 137–147)

## 2013-11-03 LAB — CBC
HCT: 36.3 % (ref 36.0–46.0)
HEMOGLOBIN: 11 g/dL — AB (ref 12.0–15.0)
MCH: 23.9 pg — AB (ref 26.0–34.0)
MCHC: 30.3 g/dL (ref 30.0–36.0)
MCV: 78.7 fL (ref 78.0–100.0)
PLATELETS: 414 10*3/uL — AB (ref 150–400)
RBC: 4.61 MIL/uL (ref 3.87–5.11)
RDW: 17.3 % — ABNORMAL HIGH (ref 11.5–15.5)
WBC: 12 10*3/uL — AB (ref 4.0–10.5)

## 2013-11-03 LAB — GLUCOSE, CAPILLARY: GLUCOSE-CAPILLARY: 124 mg/dL — AB (ref 70–99)

## 2013-11-03 LAB — VITAMIN B12: Vitamin B-12: 1021 pg/mL — ABNORMAL HIGH (ref 211–911)

## 2013-11-03 LAB — STREP PNEUMONIAE URINARY ANTIGEN: STREP PNEUMO URINARY ANTIGEN: NEGATIVE

## 2013-11-03 MED ORDER — METHYLPREDNISOLONE SODIUM SUCC 125 MG IJ SOLR
60.0000 mg | Freq: Three times a day (TID) | INTRAMUSCULAR | Status: AC
  Administered 2013-11-03 – 2013-11-04 (×5): 60 mg via INTRAVENOUS
  Filled 2013-11-03 (×6): qty 0.96

## 2013-11-03 MED ORDER — ALBUTEROL SULFATE (2.5 MG/3ML) 0.083% IN NEBU
2.5000 mg | INHALATION_SOLUTION | Freq: Four times a day (QID) | RESPIRATORY_TRACT | Status: DC
  Administered 2013-11-03 – 2013-11-05 (×12): 2.5 mg via RESPIRATORY_TRACT
  Filled 2013-11-03 (×12): qty 3

## 2013-11-03 MED ORDER — CLOTRIMAZOLE 1 % VA CREA
1.0000 | TOPICAL_CREAM | Freq: Every day | VAGINAL | Status: DC
  Administered 2013-11-03 – 2013-11-06 (×4): 1 via VAGINAL
  Filled 2013-11-03: qty 45

## 2013-11-03 MED ORDER — LEVOFLOXACIN 750 MG PO TABS
750.0000 mg | ORAL_TABLET | Freq: Every day | ORAL | Status: DC
  Administered 2013-11-04 – 2013-11-07 (×4): 750 mg via ORAL
  Filled 2013-11-03 (×4): qty 1

## 2013-11-03 MED ORDER — DOCUSATE SODIUM 100 MG PO CAPS
100.0000 mg | ORAL_CAPSULE | Freq: Two times a day (BID) | ORAL | Status: DC
  Administered 2013-11-03 – 2013-11-07 (×9): 100 mg via ORAL
  Filled 2013-11-03 (×10): qty 1

## 2013-11-03 MED ORDER — SENNA 8.6 MG PO TABS
2.0000 | ORAL_TABLET | Freq: Every day | ORAL | Status: DC
  Administered 2013-11-03 – 2013-11-06 (×3): 17.2 mg via ORAL
  Filled 2013-11-03 (×4): qty 2

## 2013-11-03 NOTE — Progress Notes (Signed)
[facility] PROGRESS NOTE  Kathryn Mahoney ION:[id number] DOB: 1961-05-11 DOA: 11/02/2013 PCP: Kathryn Mahoney A, PA-C  Assessment/Plan: Acute on chronic respiratory failure  -Patient is usually maintained on 2 L nasal cannula at night at home  -Secondary to pneumonia and COPD exacerbation  -pt initially wanted transfer to [facility] I spent 45 min on the telephone with transfer center, pt changed her mind and wanted to stay at [facility]  COPD exacerbation  -Continue Solu-Medrol--increase to 60mg  q 8hrs -Aerosolized albuterol and Atrovent  CAP  -continue levofloxacin-->change to po today -Urine Legionella antigen  -Urine Streptococcus pneumoniae antigen--neg Hypertension  -Patient has not taken any hypertensive medications since Christmas time  -Restart Azor  -pt states Medicaid did not pay for Azor  Tobacco abuse  -cessation discussed  -25pk yrs  Family Communication: no family today Disposition Plan: Home when medically stable  Antibiotics:  Levofloxacin 11/02/13>>>        Procedures/Studies: Dg Chest 2 View (if Patient Has Fever And/or Copd)  11/02/2013   CLINICAL DATA:  Shortness of breath  EXAM: CHEST  2 VIEW  COMPARISON:  08/23/2012  FINDINGS: Cardiomegaly which is chronic. There is diffuse interstitial opacity with some airspace type opacities at the right base. No effusion or pneumothorax.  IMPRESSION: 1. Right basilar infiltrate, primarily concerning for pneumonia. 2. Pulmonary venous congestion.   Electronically Signed   By: Kathryn Mahoney M.D.   On: 11/02/2013 02:54         Subjective: Patient is breathing a little better today. Denies any fevers, chills, chest discomfort, nausea, vomiting, diarrhea, abdominal pain, dysuria, hematuria. She feels constipated. Shortness of breath is somewhat better than yesterday. Has a nonproductive cough.  Objective: Filed Vitals:   11/02/13 2218 11/02/13 2330 11/03/13 0600 11/03/13 0849  BP:  169/88 157/80    Pulse:  102 93   Temp:   98 F (36.7 C)   TempSrc:   Oral   Resp:   20   Height:      Weight:      SpO2: 95%  95% 99%    Intake/Output Summary (Last 24 hours) at 11/03/13 1324 Last data filed at 11/03/13 1233  Gross per 24 hour  Intake   1020 ml  Output      4 ml  Net   1016 ml   Weight change:  Exam:   General:  Pt is alert, follows commands appropriately, not in acute distress  HEENT: No icterus, No thrush,  [place]/AT  Cardiovascular: RRR, S1/S2, no rubs, no gallops  Respiratory: Bilateral scattered wheezing. Good air movement.  Abdomen: Soft/+BS, non tender, non distended, no guarding  Extremities: No edema, No lymphangitis, No petechiae, No rashes, no synovitis  Data Reviewed: Basic Metabolic Panel:  Recent Labs Lab 11/02/13 0149 11/03/13 1033  NA 140 139  K 3.8 4.0  CL 100 98  CO2 28 30  GLUCOSE 109* 144*  BUN 15 16  CREATININE 0.68 0.68  CALCIUM 8.9 10.0   Liver Function Tests: No results found for this basename: AST, ALT, ALKPHOS, BILITOT, PROT, ALBUMIN,  in the last 168 hours No results found for this basename: LIPASE, AMYLASE,  in the last 168 hours No results found for this basename: AMMONIA,  in the last 168 hours CBC:  Recent Labs Lab 11/02/13 0149 11/03/13 1033  WBC 9.8 12.0*  HGB 10.8* 11.0*  HCT 34.8* 36.3  MCV 79.3 78.7  PLT 388 414*   Cardiac Enzymes: No results found for this basename: CKTOTAL, CKMB,  CKMBINDEX, TROPONINI,  in the last 168 hours BNP: No components found with this basename: POCBNP,  CBG: No results found for this basename: GLUCAP,  in the last 168 hours  Recent Results (from the past 240 hour(s))  CULTURE, BLOOD (ROUTINE X 2)     Status: None   Collection Time    11/02/13  7:20 AM      Result Value Range Status   Specimen Description BLOOD RIGHT ARM   Final   Special Requests BOTTLES DRAWN AEROBIC AND ANAEROBIC 5ML   Final   Culture  Setup Time     Final   Value: 11/02/2013 10:17     Performed at [facility]   Culture     Final   Value:        BLOOD CULTURE RECEIVED NO GROWTH TO DATE CULTURE WILL BE HELD FOR 5 DAYS BEFORE ISSUING A FINAL NEGATIVE REPORT     Performed at [facility]   Report Status PENDING   Incomplete  CULTURE, BLOOD (ROUTINE X 2)     Status: None   Collection Time    11/02/13 11:45 AM      Result Value Range Status   Specimen Description BLOOD RIGHT HAND   Final   Special Requests BOTTLES DRAWN AEROBIC AND ANAEROBIC 3ML   Final   Culture  Setup Time     Final   Value: 11/02/2013 13:57     Performed at [facility]   Culture     Final   Value:        BLOOD CULTURE RECEIVED NO GROWTH TO DATE CULTURE WILL BE HELD FOR 5 DAYS BEFORE ISSUING A FINAL NEGATIVE REPORT     Performed at [facility]   Report Status PENDING   Incomplete     Scheduled Meds: . albuterol  2.5 mg Nebulization QID  . amLODipine  5 mg Oral Daily   And  . irbesartan  300 mg Oral Daily  . clotrimazole  1 Applicatorful Vaginal QHS  . docusate sodium  100 mg Oral BID  . enoxaparin (LOVENOX) injection  60 mg Subcutaneous Q24H  . [START ON 11/04/2013] levofloxacin  750 mg Oral Daily  . methylPREDNISolone (SOLU-MEDROL) injection  60 mg Intravenous Q8H  . senna  2 tablet Oral Daily  . sodium chloride  3 mL Intravenous Q12H   Continuous Infusions:    Deven Audi, DO  [facility] Pager [phone]  If 7PM-7AM, please contact night-coverage [email or web address] Password [email or web address] 11/03/2013, 1:24 PM   LOS: 1 day

## 2013-11-04 LAB — BASIC METABOLIC PANEL
BUN: 19 mg/dL (ref 6–23)
CALCIUM: 9.3 mg/dL (ref 8.4–10.5)
CO2: 26 mEq/L (ref 19–32)
CREATININE: 0.67 mg/dL (ref 0.50–1.10)
Chloride: 96 mEq/L (ref 96–112)
Glucose, Bld: 203 mg/dL — ABNORMAL HIGH (ref 70–99)
Potassium: 4.1 mEq/L (ref 3.7–5.3)
Sodium: 136 mEq/L — ABNORMAL LOW (ref 137–147)

## 2013-11-04 MED ORDER — PREDNISONE 10 MG PO TABS: 60.0000 mg | ORAL_TABLET | Freq: Every day | ORAL | Status: DC

## 2013-11-04 MED ORDER — LOSARTAN POTASSIUM 100 MG PO TABS: 100.0000 mg | ORAL_TABLET | Freq: Every day | ORAL | Status: DC

## 2013-11-04 MED ORDER — AMLODIPINE BESYLATE 10 MG PO TABS: 10.0000 mg | ORAL_TABLET | Freq: Every day | ORAL | Status: DC

## 2013-11-04 MED ORDER — AMLODIPINE BESYLATE 5 MG PO TABS
5.0000 mg | ORAL_TABLET | Freq: Once | ORAL | Status: AC
Start: 2013-11-04 — End: 2013-11-04
  Administered 2013-11-04: 5 mg via ORAL
  Filled 2013-11-04: qty 1

## 2013-11-04 MED ORDER — AMLODIPINE BESYLATE 10 MG PO TABS
10.0000 mg | ORAL_TABLET | Freq: Every day | ORAL | Status: DC
  Administered 2013-11-05 – 2013-11-07 (×3): 10 mg via ORAL
  Filled 2013-11-04 (×3): qty 1

## 2013-11-04 MED ORDER — LEVOFLOXACIN 750 MG PO TABS: 750.0000 mg | ORAL_TABLET | Freq: Every day | ORAL | Status: DC

## 2013-11-04 MED ORDER — PREDNISONE 50 MG PO TABS
60.0000 mg | ORAL_TABLET | Freq: Every day | ORAL | Status: DC
  Filled 2013-11-04 (×2): qty 1

## 2013-11-04 NOTE — Progress Notes (Signed)
[facility] PROGRESS NOTE  Kathryn Mahoney LMB:[id number] DOB: Oct 26, 1961 DOA: 11/02/2013 PCP: [name] A, PA-C  Assessment/Plan: Acute on chronic respiratory failure  -Patient is usually maintained on 2 L nasal cannula at night at home, but she has run out of oxygen  -Secondary to pneumonia and COPD exacerbation  -pt initially wanted transfer to [facility] I spent 45 min on the telephone with transfer center, pt changed her mind and wanted to stay at [facility]  COPD exacerbation  -Continue Solu-Medrol--increase to 60mg  q 8hrs  -Aerosolized albuterol and Atrovent  -Patient will need home oxygen 24/7 -During ambulation on room air, the patient desaturated to 78% CAP  -continue levofloxacin-->change to po today  -Urine Legionella antigen--neg -Urine Streptococcus pneumoniae antigen--neg  Hypertension  -Patient has not taken any hypertensive medications since Christmas time  -Restart Azor  -pt states Medicaid did not pay for Azor  -Increase amlodipine to 10 mg daily -Continue ARB Tobacco abuse  -cessation discussed  -30+pk yrs  Family Communication: no family today  Disposition Plan: Home when medically stable  Antibiotics:  Levofloxacin 11/02/13>>>        Procedures/Studies: Dg Chest 2 View (if Patient Has Fever And/or Copd)  11/02/2013   CLINICAL DATA:  Shortness of breath  EXAM: CHEST  2 VIEW  COMPARISON:  08/23/2012  FINDINGS: Cardiomegaly which is chronic. There is diffuse interstitial opacity with some airspace type opacities at the right base. No effusion or pneumothorax.  IMPRESSION: 1. Right basilar infiltrate, primarily concerning for pneumonia. 2. Pulmonary venous congestion.   Electronically Signed   By: Jorje Guild M.D.   On: 11/02/2013 02:54         Subjective: Patient denies fevers, chills, chest discomfort, shortness breath, nausea, vomiting, diarrhea, abdominal pain. She did have shortness of breath with ambulation. Denies any  hemoptysis.  Objective: Filed Vitals:   11/04/13 0436 11/04/13 0505 11/04/13 0844 11/04/13 1200  BP:  170/95    Pulse:  99    Temp:  97.9 F (36.6 C)    TempSrc:  Oral    Resp:  20    Height:      Weight:      SpO2: 93% 93% 95% 95%    Intake/Output Summary (Last 24 hours) at 11/04/13 1305 Last data filed at 11/04/13 0500  Gross per 24 hour  Intake    360 ml  Output    150 ml  Net    210 ml   Weight change:  Exam:   General:  Pt is alert, follows commands appropriately, not in acute distress  HEENT: No icterus, No thrush, [place]/AT  Cardiovascular: RRR, S1/S2, no rubs, no gallops  Respiratory: Bibasilar crackles. Bibasilar wheezing. Good air movement.  Abdomen: Soft/+BS, non tender, non distended, no guarding  Extremities: trace LE edema, No lymphangitis, No petechiae, No rashes, no synovitis  Data Reviewed: Basic Metabolic Panel:  Recent Labs Lab 11/02/13 0149 11/03/13 1033 11/04/13 0902  NA 140 139 136*  K 3.8 4.0 4.1  CL 100 98 96  CO2 28 30 26   GLUCOSE 109* 144* 203*  BUN 15 16 19   CREATININE 0.68 0.68 0.67  CALCIUM 8.9 10.0 9.3   Liver Function Tests: No results found for this basename: AST, ALT, ALKPHOS, BILITOT, PROT, ALBUMIN,  in the last 168 hours No results found for this basename: LIPASE, AMYLASE,  in the last 168 hours No results found for this basename: AMMONIA,  in the last 168 hours CBC:  Recent Labs Lab 11/02/13 0149  11/03/13 1033  WBC 9.8 12.0*  HGB 10.8* 11.0*  HCT 34.8* 36.3  MCV 79.3 78.7  PLT 388 414*   Cardiac Enzymes: No results found for this basename: CKTOTAL, CKMB, CKMBINDEX, TROPONINI,  in the last 168 hours BNP: No components found with this basename: POCBNP,  CBG:  Recent Labs Lab 11/03/13 2235  GLUCAP 124*    Recent Results (from the past 240 hour(s))  CULTURE, BLOOD (ROUTINE X 2)     Status: None   Collection Time    11/02/13  7:20 AM      Result Value Range Status   Specimen Description BLOOD RIGHT ARM    Final   Special Requests BOTTLES DRAWN AEROBIC AND ANAEROBIC 5ML   Final   Culture  Setup Time     Final   Value: 11/02/2013 10:17     Performed at [facility]   Culture     Final   Value:        BLOOD CULTURE RECEIVED NO GROWTH TO DATE CULTURE WILL BE HELD FOR 5 DAYS BEFORE ISSUING A FINAL NEGATIVE REPORT     Performed at [facility]   Report Status PENDING   Incomplete  CULTURE, BLOOD (ROUTINE X 2)     Status: None   Collection Time    11/02/13 11:45 AM      Result Value Range Status   Specimen Description BLOOD RIGHT HAND   Final   Special Requests BOTTLES DRAWN AEROBIC AND ANAEROBIC 3ML   Final   Culture  Setup Time     Final   Value: 11/02/2013 13:57     Performed at [facility]   Culture     Final   Value:        BLOOD CULTURE RECEIVED NO GROWTH TO DATE CULTURE WILL BE HELD FOR 5 DAYS BEFORE ISSUING A FINAL NEGATIVE REPORT     Performed at [facility]   Report Status PENDING   Incomplete     Scheduled Meds: . albuterol  2.5 mg Nebulization QID  . [START ON 11/05/2013] amLODipine  10 mg Oral Daily  . amLODipine  5 mg Oral Once  . clotrimazole  1 Applicatorful Vaginal QHS  . docusate sodium  100 mg Oral BID  . enoxaparin (LOVENOX) injection  60 mg Subcutaneous Q24H  . irbesartan  300 mg Oral Daily  . levofloxacin  750 mg Oral Daily  . methylPREDNISolone (SOLU-MEDROL) injection  60 mg Intravenous Q8H  . senna  2 tablet Oral Daily  . sodium chloride  3 mL Intravenous Q12H   Continuous Infusions:    Kathryn Lisby, DO  [facility] Pager [phone]  If 7PM-7AM, please contact night-coverage [email or web address] Password [email or web address] 11/04/2013, 1:05 PM   LOS: 2 days

## 2013-11-04 NOTE — Progress Notes (Signed)
Oxygen saturation on room air at rest  91% Oxygen saturation ambulating on room air--78% Oxygen saturation with 2 liters nasal cannula--98%   Shanon Brow Serah Nicoletti, DO

## 2013-11-05 MED ORDER — POLYVINYL ALCOHOL 1.4 % OP SOLN
1.0000 [drp] | OPHTHALMIC | Status: DC | PRN
  Administered 2013-11-05: 1 [drp] via OPHTHALMIC
  Filled 2013-11-05: qty 15

## 2013-11-05 MED ORDER — ALBUTEROL SULFATE HFA 108 (90 BASE) MCG/ACT IN AERS: 2.0000 | INHALATION_SPRAY | Freq: Four times a day (QID) | RESPIRATORY_TRACT | Status: DC | PRN

## 2013-11-05 MED ORDER — METHYLPREDNISOLONE SODIUM SUCC 125 MG IJ SOLR
60.0000 mg | Freq: Four times a day (QID) | INTRAMUSCULAR | Status: AC
  Administered 2013-11-05 – 2013-11-06 (×6): 60 mg via INTRAVENOUS
  Filled 2013-11-05 (×8): qty 0.96

## 2013-11-05 MED ORDER — DM-GUAIFENESIN ER 30-600 MG PO TB12
1.0000 | ORAL_TABLET | Freq: Two times a day (BID) | ORAL | Status: DC
  Administered 2013-11-05 – 2013-11-07 (×5): 1 via ORAL
  Filled 2013-11-05 (×6): qty 1

## 2013-11-05 MED ORDER — METOPROLOL TARTRATE 12.5 MG HALF TABLET: 12.5000 mg | ORAL_TABLET | Freq: Two times a day (BID) | ORAL | Status: DC

## 2013-11-05 MED ORDER — METOPROLOL TARTRATE 12.5 MG HALF TABLET
12.5000 mg | ORAL_TABLET | Freq: Two times a day (BID) | ORAL | Status: DC
  Administered 2013-11-05 – 2013-11-06 (×4): 12.5 mg via ORAL
  Filled 2013-11-05 (×6): qty 1

## 2013-11-05 NOTE — Discharge Summary (Signed)
Physician Discharge Summary  Kathryn Mahoney [id number]:[id number] DOB: 30-Sep-1961 DOA: 11/02/2013  PCP: Kathryn Memos, PA-C  Admit date: 11/02/2013 Discharge date: 11/07/13 Recommendations for Outpatient Follow-up:  1. Pt will need to follow up with PCP in 2 weeks post discharge 2. Please obtain BMP to evaluate electrolytes and kidney function 3. Please also check CBC to evaluate Hg and Hct levels   Discharge Diagnoses:  Principal Problem:   PNA (pneumonia) Active Problems:   Hypertension   COPD exacerbation   Hypoxia   Acute-on-chronic respiratory failure  Acute on chronic respiratory failure  -Patient is usually maintained on 2 L nasal cannula at night at home, but she has run out of oxygen  -Secondary to pneumonia and COPD exacerbation  -pt initially wanted transfer to [facility] I spent 45 min on the telephone with transfer center, pt changed her mind and wanted to stay at [facility]  COPD exacerbation  -Intravenous Solu-Medrol was initially started  -With a decrease of Solu-Medrol and transitioned to prednisone, the patient had increased wheezing and work of breathing  -Solu-Medrol was restarted 11/05/13 after which patient showed clinical improvement  -ultimately weaned back to prednisone--home with prednisone 60 mg daily, decrease by 10 mg daily -Aerosolized albuterol and Atrovent  -Patient will need home oxygen 24/--she was previously on oxygen only at nighttime  -During ambulation on room air, the patient desaturated to 78%  -add mucinex DM  CAP  -continue levofloxacin-->30more days to complete 7 days of therapy  -Urine Legionella antigen--neg  -Urine Streptococcus pneumoniae antigen--neg  Hypertension  -Patient has not taken any hypertensive medications since Christmas time  -Restart Kathryn Mahoney while the patient was in the hospital -pt states Medicaid did not pay for Kathryn Mahoney  -Increase amlodipine to 10 mg daily  -Continue ARB--home with losartan 100 mg daily  -Metoprolol  tartrate 25mg  bid was added Tobacco abuse  -cessation discussed  -30+pk yrs  Diabetes Mellitus--new diagnosis -hemoglobin A1c--6.8 -start novolog SSI  -Schedule outpatient education with diabetes coordinator -Discussed with some modification- -patient wants to start metformin.I have discussed the risks, benefits, and alternatives. Patient agrees and understands.  -Start metformin 500 mg twice a day  Family Communication: no family today  Disposition Plan: Home when medically stable  Antibiotics:  Levofloxacin 11/02/13>>>   Discharge Condition: Stable  Disposition:  home  Diet: Heart healthy  Wt Readings from Last 3 Encounters:  11/02/13 128.822 kg (284 lb)  08/23/12 127.007 kg (280 lb)  07/02/12 142.339 kg (313 lb 12.8 oz)    History of present illness:  64 are old female with a history of COPD, CHF who presents with shortness of breath. Patient reports a 2 to 3 day history of progressive shortness of breath. She uses an inhaler and is on 1 1/2-2 L of oxygen at home at baseline. She reports chills last Friday but no documented fevers. She reports a cough productive of clear sputum. Patient also reports increasing blood pressures. She has not taken her blood pressure medicines since the day after Christmas. Patient denies any chest pain.  No reports of nausea, vomiting, diarrhea, abdominal pain, dizziness. The patient was admitted and started on intravenous Solu-Medrol, aerosolized albuterol and Atrovent. Chest x-ray suggested pneumonia. The patient was treated with levofloxacin. She gradually improved. Solu-Medrol was transitioned to oral prednisone. Initially, the patient had increased wheezing and work of breathing. Solu-Medrol was restarted. She clinically improved again. The patient was weaned again to prednisone and remained clinically stable. She was continued on levofloxacin. Ambulatory pulse oximetry revealed  desaturation. The patient was set up with home oxygen.    Discharge  Exam: Filed Vitals:   11/05/13 0500  BP: 157/81  Pulse: 102  Temp: 97.9 F (36.6 C)  Resp: 18   Filed Vitals:   11/04/13 2012 11/04/13 2200 11/05/13 0202 11/05/13 0500  BP:  169/92  157/81  Pulse:  104  102  Temp:  98.2 F (36.8 C)  97.9 F (36.6 C)  TempSrc:  Oral  Oral  Resp:  18  18  Height:      Weight:      SpO2: 95% 94% 98% 98%   General: A&O x 3, NAD, pleasant, cooperative Cardiovascular: RRR, no rub, no gallop, no S3 Respiratory: bibasilar rales. Good air movement. No wheezing. Abdomen:soft, nontender, nondistended, positive bowel sounds Extremities: trace LE edema, No lymphangitis, no petechiae  Discharge Instructions     Medication List    STOP taking these medications       Kathryn Mahoney 5-40 MG per tablet  Commonly known as:  Kathryn Mahoney      TAKE these medications       albuterol 108 (90 BASE) MCG/ACT inhaler  Commonly known as:  PROVENTIL HFA;VENTOLIN HFA  Inhale 2 puffs into the lungs every 6 (six) hours as needed for wheezing or shortness of breath.     albuterol (2.5 MG/3ML) 0.083% nebulizer solution  Commonly known as:  PROVENTIL  Take 2.5 mg by nebulization every 6 (six) hours as needed for wheezing or shortness of breath.     amLODipine 10 MG tablet  Commonly known as:  NORVASC  Take 1 tablet (10 mg total) by mouth daily.     HYDROcodone-acetaminophen 5-325 MG per tablet  Commonly known as:  NORCO/VICODIN  Take 1 tablet by mouth every 6 (six) hours as needed for moderate pain.     levofloxacin 750 MG tablet  Commonly known as:  LEVAQUIN  Take 1 tablet (750 mg total) by mouth daily.     losartan 100 MG tablet  Commonly known as:  COZAAR  Take 1 tablet (100 mg total) by mouth daily.     metoprolol tartrate 12.5 mg Tabs tablet  Commonly known as:  LOPRESSOR  Take 0.5 tablets (12.5 mg total) by mouth 2 (two) times daily.     predniSONE 10 MG tablet  Commonly known as:  DELTASONE  Take 6 tablets (60 mg total) by mouth daily with  breakfast. Start 11/06/13.  Decrease by 10mg  (1 tablet) daily     QVAR 40 MCG/ACT inhaler  Generic drug:  beclomethasone  Inhale 1 puff into the lungs daily.     traMADol 50 MG tablet  Commonly known as:  ULTRAM  Take 50 mg by mouth every 6 (six) hours as needed.         The results of significant diagnostics from this hospitalization (including imaging, microbiology, ancillary and laboratory) are listed below for reference.    Significant Diagnostic Studies: Dg Chest 2 View (if Patient Has Fever And/or Copd)  11/02/2013   CLINICAL DATA:  Shortness of breath  EXAM: CHEST  2 VIEW  COMPARISON:  08/23/2012  FINDINGS: Cardiomegaly which is chronic. There is diffuse interstitial opacity with some airspace type opacities at the right base. No effusion or pneumothorax.  IMPRESSION: 1. Right basilar infiltrate, primarily concerning for pneumonia. 2. Pulmonary venous congestion.   Electronically Signed   By: Jorje Guild M.D.   On: 11/02/2013 02:54     Microbiology: Recent Results (from the past 240 hour(s))  CULTURE, BLOOD (ROUTINE X 2)     Status: None   Collection Time    11/02/13  7:20 AM      Result Value Range Status   Specimen Description BLOOD RIGHT ARM   Final   Special Requests BOTTLES DRAWN AEROBIC AND ANAEROBIC 5ML   Final   Culture  Setup Time     Final   Value: 11/02/2013 10:17     Performed at [facility]   Culture     Final   Value:        BLOOD CULTURE RECEIVED NO GROWTH TO DATE CULTURE WILL BE HELD FOR 5 DAYS BEFORE ISSUING A FINAL NEGATIVE REPORT     Performed at [facility]   Report Status PENDING   Incomplete  CULTURE, BLOOD (ROUTINE X 2)     Status: None   Collection Time    11/02/13 11:45 AM      Result Value Range Status   Specimen Description BLOOD RIGHT HAND   Final   Special Requests BOTTLES DRAWN AEROBIC AND ANAEROBIC 3ML   Final   Culture  Setup Time     Final   Value: 11/02/2013 13:57     Performed at [facility]   Culture      Final   Value:        BLOOD CULTURE RECEIVED NO GROWTH TO DATE CULTURE WILL BE HELD FOR 5 DAYS BEFORE ISSUING A FINAL NEGATIVE REPORT     Performed at [facility]   Report Status PENDING   Incomplete     Labs: Basic Metabolic Panel:  Recent Labs Lab 11/02/13 0149 11/03/13 1033 11/04/13 0902  NA 140 139 136*  K 3.8 4.0 4.1  CL 100 98 96  CO2 28 30 26   GLUCOSE 109* 144* 203*  BUN 15 16 19   CREATININE 0.68 0.68 0.67  CALCIUM 8.9 10.0 9.3   Liver Function Tests: No results found for this basename: AST, ALT, ALKPHOS, BILITOT, PROT, ALBUMIN,  in the last 168 hours No results found for this basename: LIPASE, AMYLASE,  in the last 168 hours No results found for this basename: AMMONIA,  in the last 168 hours CBC:  Recent Labs Lab 11/02/13 0149 11/03/13 1033  WBC 9.8 12.0*  HGB 10.8* 11.0*  HCT 34.8* 36.3  MCV 79.3 78.7  PLT 388 414*   Cardiac Enzymes: No results found for this basename: CKTOTAL, CKMB, CKMBINDEX, TROPONINI,  in the last 168 hours BNP: No components found with this basename: POCBNP,  CBG:  Recent Labs Lab 11/03/13 2235  GLUCAP 124*    Time coordinating discharge:  Greater than 30 minutes  Signed:  Tom Macpherson, DO [facility] Pager: [phone] 11/05/2013, 6:35 AM

## 2013-11-05 NOTE — Progress Notes (Signed)
[facility] PROGRESS NOTE  Kathryn Mahoney AST:[id number] DOB: [birth year] DOA: 11/02/2013 PCP: [name] A, PA-C  Assessment/Plan: Acute on chronic respiratory failure  -Patient is usually maintained on 2 L nasal cannula at night at home, but she has run out of oxygen  -Secondary to pneumonia and COPD exacerbation  -pt initially wanted transfer to [facility] I spent 45 min on the telephone with transfer center, pt changed her mind and wanted to stay at [facility]  COPD exacerbation  -Intravenous Solu-Medrol was initially started  -With a decrease of Solu-Medrol and transitioned to prednisone, the patient had increased wheezing and work of breathing -Solu-Medrol was restarted today -Aerosolized albuterol and Atrovent  -Patient will need home oxygen 24/--she was previously on oxygen only at nighttime  -During ambulation on room air, the patient desaturated to 78%  -add mucinex DM CAP   -continue levofloxacin-->4 more days to complete 7 days of therapy  -Urine Legionella antigen--neg  -Urine Streptococcus pneumoniae antigen--neg  Hypertension  -Patient has not taken any hypertensive medications since Christmas time  -Restart Azor  -pt states Medicaid did not pay for Azor  -Increase amlodipine to 10 mg daily  -Continue ARB--home with losartan 100 mg daily  -Metoprolol tartrate 12.5 mg twice a day was added today Tobacco abuse  -cessation discussed  -30+pk yrs  Family Communication: no family today  Disposition Plan: Home when medically stable  Antibiotics:  Levofloxacin 11/02/13>>>           Procedures/Studies: Dg Chest 2 View (if Patient Has Fever And/or Copd)  11/02/2013   CLINICAL DATA:  Shortness of breath  EXAM: CHEST  2 VIEW  COMPARISON:  08/23/2012  FINDINGS: Cardiomegaly which is chronic. There is diffuse interstitial opacity with some airspace type opacities at the right base. No effusion or pneumothorax.  IMPRESSION: 1. Right basilar infiltrate,  primarily concerning for pneumonia. 2. Pulmonary venous congestion.   Electronically Signed   By: Jorje Guild M.D.   On: 11/02/2013 02:54         Subjective: Patient states that her breathing has improved somewhat. Denies any fevers, chills, nausea, vomiting, diarrhea, chest pain, abdominal pain, dysuria. She complains of a nonproductive cough.  Objective: Filed Vitals:   11/04/13 2012 11/04/13 2200 11/05/13 0202 11/05/13 0500  BP:  169/92  157/81  Pulse:  104  102  Temp:  98.2 F (36.8 C)  97.9 F (36.6 C)  TempSrc:  Oral  Oral  Resp:  18  18  Height:      Weight:      SpO2: 95% 94% 98% 98%   No intake or output data in the 24 hours ending 11/05/13 0843 Weight change:  Exam:   General:  Pt is alert, follows commands appropriately, not in acute distress  HEENT: No icterus, No thrush,  [place]/AT  Cardiovascular: RRR, S1/S2, no rubs, no gallops  Respiratory: Bilateral diffuse wheezing. Scattered rales.  Abdomen: Soft/+BS, non tender, non distended, no guarding  Extremities: trace LE edema, No lymphangitis, No petechiae, No rashes, no synovitis  Data Reviewed: Basic Metabolic Panel:  Recent Labs Lab 11/02/13 0149 11/03/13 1033 11/04/13 0902  NA 140 139 136*  K 3.8 4.0 4.1  CL 100 98 96  CO2 28 30 26   GLUCOSE 109* 144* 203*  BUN 15 16 19   CREATININE 0.68 0.68 0.67  CALCIUM 8.9 10.0 9.3   Liver Function Tests: No results found for this basename: AST, ALT, ALKPHOS, BILITOT, PROT, ALBUMIN,  in the last 168 hours No  results found for this basename: LIPASE, AMYLASE,  in the last 168 hours No results found for this basename: AMMONIA,  in the last 168 hours CBC:  Recent Labs Lab 11/02/13 0149 11/03/13 1033  WBC 9.8 12.0*  HGB 10.8* 11.0*  HCT 34.8* 36.3  MCV 79.3 78.7  PLT 388 414*   Cardiac Enzymes: No results found for this basename: CKTOTAL, CKMB, CKMBINDEX, TROPONINI,  in the last 168 hours BNP: No components found with this basename: POCBNP,   CBG:  Recent Labs Lab 11/03/13 2235  GLUCAP 124*    Recent Results (from the past 240 hour(s))  CULTURE, BLOOD (ROUTINE X 2)     Status: None   Collection Time    11/02/13  7:20 AM      Result Value Range Status   Specimen Description BLOOD RIGHT ARM   Final   Special Requests BOTTLES DRAWN AEROBIC AND ANAEROBIC 5ML   Final   Culture  Setup Time     Final   Value: 11/02/2013 10:17     Performed at [facility]   Culture     Final   Value:        BLOOD CULTURE RECEIVED NO GROWTH TO DATE CULTURE WILL BE HELD FOR 5 DAYS BEFORE ISSUING A FINAL NEGATIVE REPORT     Performed at [facility]   Report Status PENDING   Incomplete  CULTURE, BLOOD (ROUTINE X 2)     Status: None   Collection Time    11/02/13 11:45 AM      Result Value Range Status   Specimen Description BLOOD RIGHT HAND   Final   Special Requests BOTTLES DRAWN AEROBIC AND ANAEROBIC 3ML   Final   Culture  Setup Time     Final   Value: 11/02/2013 13:57     Performed at [facility]   Culture     Final   Value:        BLOOD CULTURE RECEIVED NO GROWTH TO DATE CULTURE WILL BE HELD FOR 5 DAYS BEFORE ISSUING A FINAL NEGATIVE REPORT     Performed at [facility]   Report Status PENDING   Incomplete     Scheduled Meds: . albuterol  2.5 mg Nebulization QID  . amLODipine  10 mg Oral Daily  . clotrimazole  1 Applicatorful Vaginal QHS  . docusate sodium  100 mg Oral BID  . enoxaparin (LOVENOX) injection  60 mg Subcutaneous Q24H  . irbesartan  300 mg Oral Daily  . levofloxacin  750 mg Oral Daily  . metoprolol tartrate  12.5 mg Oral BID  . predniSONE  60 mg Oral Q breakfast  . senna  2 tablet Oral Daily  . sodium chloride  3 mL Intravenous Q12H   Continuous Infusions:    Remberto Lienhard, DO  [facility] Pager [phone]  If 7PM-7AM, please contact night-coverage [email or web address] Password [email or web address] 11/05/2013, 8:43 AM   LOS: 3 days

## 2013-11-06 DIAGNOSIS — R7309 Other abnormal glucose: Secondary | ICD-10-CM

## 2013-11-06 DIAGNOSIS — R739 Hyperglycemia, unspecified: Secondary | ICD-10-CM

## 2013-11-06 LAB — BASIC METABOLIC PANEL
BUN: 20 mg/dL (ref 6–23)
CALCIUM: 8.9 mg/dL (ref 8.4–10.5)
CO2: 30 mEq/L (ref 19–32)
Chloride: 97 mEq/L (ref 96–112)
Creatinine, Ser: 0.65 mg/dL (ref 0.50–1.10)
GFR calc Af Amer: >90 mL/min (ref >90)
GFR calc non Af Amer: >90 mL/min (ref >90)
GLUCOSE: 209 mg/dL — AB (ref 70–99)
Potassium: 4.3 mEq/L (ref 3.7–5.3)
Sodium: 139 mEq/L (ref 137–147)

## 2013-11-06 LAB — GLUCOSE, CAPILLARY: Glucose-Capillary: 124 mg/dL — ABNORMAL HIGH (ref 70–99)

## 2013-11-06 LAB — HEMOGLOBIN A1C
HEMOGLOBIN A1C: 6.8 % — AB (ref <5.7)
MEAN PLASMA GLUCOSE: 148 mg/dL — AB (ref <117)

## 2013-11-06 MED ORDER — INSULIN ASPART 100 UNIT/ML ~~LOC~~ SOLN
0.0000 [IU] | Freq: Three times a day (TID) | SUBCUTANEOUS | Status: DC
  Administered 2013-11-06 – 2013-11-07 (×2): 1 [IU] via SUBCUTANEOUS

## 2013-11-06 MED ORDER — PREDNISONE 50 MG PO TABS
60.0000 mg | ORAL_TABLET | Freq: Every day | ORAL | Status: DC
  Administered 2013-11-07: 60 mg via ORAL
  Filled 2013-11-06 (×2): qty 1

## 2013-11-06 MED ORDER — IPRATROPIUM-ALBUTEROL 0.5-2.5 (3) MG/3ML IN SOLN
3.0000 mL | Freq: Three times a day (TID) | RESPIRATORY_TRACT | Status: DC
  Administered 2013-11-06 – 2013-11-07 (×4): 3 mL via RESPIRATORY_TRACT
  Filled 2013-11-06 (×4): qty 3

## 2013-11-06 NOTE — Progress Notes (Addendum)
[facility] PROGRESS NOTE  Keidy Thurgood FIE:[id number] DOB: 1961-05-03 DOA: 11/02/2013 PCP: [name] A, PA-C  Assessment/Plan: Acute on chronic respiratory failure  -Patient is usually maintained on 2 L nasal cannula at night at home, but she has run out of oxygen  -Secondary to pneumonia and COPD exacerbation  -pt initially wanted transfer to [facility] I spent 45 min on the telephone with transfer center, pt changed her mind and wanted to stay at [facility]  COPD exacerbation  -Intravenous Solu-Medrol was initially started  -With a decrease of Solu-Medrol and transitioned to prednisone, the patient had increased wheezing and work of breathing  -Solu-Medrol was restarted 11/05/13 after which patient showed clinical improvement  -Aerosolized albuterol and Atrovent  -Patient will need home oxygen 24/--she was previously on oxygen only at nighttime  -During ambulation on room air, the patient desaturated to 78%  -add mucinex DM  CAP  -continue levofloxacin-->4 more days to complete 7 days of therapy  -Urine Legionella antigen--neg  -Urine Streptococcus pneumoniae antigen--neg  Hypertension  -Patient has not taken any hypertensive medications since Christmas time  -Restart Azor  -pt states Medicaid did not pay for Azor  -Increase amlodipine to 10 mg daily  -Continue ARB--home with losartan 100 mg daily  -Metoprolol tartrate 12.5 mg twice a day was added today  Tobacco abuse  -cessation discussed  -30+pk yrs  Hyperglycemia -Partly from steroids -Check hemoglobin A1c -start novolog SSI Family Communication: no family today  Disposition Plan: Home when medically stable  Antibiotics:  Levofloxacin 11/02/13>>>          Procedures/Studies: Dg Chest 2 View (if Patient Has Fever And/or Copd)  11/02/2013   CLINICAL DATA:  Shortness of breath  EXAM: CHEST  2 VIEW  COMPARISON:  08/23/2012  FINDINGS: Cardiomegaly which is chronic. There is diffuse interstitial  opacity with some airspace type opacities at the right base. No effusion or pneumothorax.  IMPRESSION: 1. Right basilar infiltrate, primarily concerning for pneumonia. 2. Pulmonary venous congestion.   Electronically Signed   By: Jorje Guild M.D.   On: 11/02/2013 02:54         Subjective:   Objective: Filed Vitals:   11/06/13 0456 11/06/13 0557 11/06/13 0747 11/06/13 0924  BP:  137/93  132/66  Pulse:  89  106  Temp:  97.8 F (36.6 C)    TempSrc:  Oral    Resp:  18    Height:      Weight: 139 kg (306 lb 7 oz)     SpO2:  97% 95%     Intake/Output Summary (Last 24 hours) at 11/06/13 1206 Last data filed at 11/06/13 0900  Gross per 24 hour  Intake    720 ml  Output      2 ml  Net    718 ml   Weight change:  Exam:   General:  Pt is alert, follows commands appropriately, not in acute distress  HEENT: No icterus, No thrush,  [place]/AT  Cardiovascular: RRR, S1/S2, no rubs, no gallops  Respiratory: Minimal bibasilar wheezes. Good air movement.  Abdomen: Soft/+BS, non tender, non distended, no guarding  Extremities: trace LE edema, No lymphangitis, No petechiae, No rashes, no synovitis  Data Reviewed: Basic Metabolic Panel:  Recent Labs Lab 11/02/13 0149 11/03/13 1033 11/04/13 0902 11/06/13 0811  NA 140 139 136* 139  K 3.8 4.0 4.1 4.3  CL 100 98 96 97  CO2 28 30 26 30   GLUCOSE 109* 144* 203* 209*  BUN 15  16 19 20   CREATININE 0.68 0.68 0.67 0.65  CALCIUM 8.9 10.0 9.3 8.9   Liver Function Tests: No results found for this basename: AST, ALT, ALKPHOS, BILITOT, PROT, ALBUMIN,  in the last 168 hours No results found for this basename: LIPASE, AMYLASE,  in the last 168 hours No results found for this basename: AMMONIA,  in the last 168 hours CBC:  Recent Labs Lab 11/02/13 0149 11/03/13 1033  WBC 9.8 12.0*  HGB 10.8* 11.0*  HCT 34.8* 36.3  MCV 79.3 78.7  PLT 388 414*   Cardiac Enzymes: No results found for this basename: CKTOTAL, CKMB, CKMBINDEX,  TROPONINI,  in the last 168 hours BNP: No components found with this basename: POCBNP,  CBG:  Recent Labs Lab 11/03/13 2235  GLUCAP 124*    Recent Results (from the past 240 hour(s))  CULTURE, BLOOD (ROUTINE X 2)     Status: None   Collection Time    11/02/13  7:20 AM      Result Value Range Status   Specimen Description BLOOD RIGHT ARM   Final   Special Requests BOTTLES DRAWN AEROBIC AND ANAEROBIC 5ML   Final   Culture  Setup Time     Final   Value: 11/02/2013 10:17     Performed at [facility]   Culture     Final   Value:        BLOOD CULTURE RECEIVED NO GROWTH TO DATE CULTURE WILL BE HELD FOR 5 DAYS BEFORE ISSUING A FINAL NEGATIVE REPORT     Performed at [facility]   Report Status PENDING   Incomplete  CULTURE, BLOOD (ROUTINE X 2)     Status: None   Collection Time    11/02/13 11:45 AM      Result Value Range Status   Specimen Description BLOOD RIGHT HAND   Final   Special Requests BOTTLES DRAWN AEROBIC AND ANAEROBIC 3ML   Final   Culture  Setup Time     Final   Value: 11/02/2013 13:57     Performed at [facility]   Culture     Final   Value:        BLOOD CULTURE RECEIVED NO GROWTH TO DATE CULTURE WILL BE HELD FOR 5 DAYS BEFORE ISSUING A FINAL NEGATIVE REPORT     Performed at [facility]   Report Status PENDING   Incomplete     Scheduled Meds: . amLODipine  10 mg Oral Daily  . clotrimazole  1 Applicatorful Vaginal QHS  . dextromethorphan-guaiFENesin  1 tablet Oral BID  . docusate sodium  100 mg Oral BID  . enoxaparin (LOVENOX) injection  60 mg Subcutaneous Q24H  . ipratropium-albuterol  3 mL Nebulization TID  . irbesartan  300 mg Oral Daily  . levofloxacin  750 mg Oral Daily  . methylPREDNISolone (SOLU-MEDROL) injection  60 mg Intravenous Q6H  . metoprolol tartrate  12.5 mg Oral BID  . [START ON 11/07/2013] predniSONE  60 mg Oral Q breakfast  . senna  2 tablet Oral Daily  . sodium chloride  3 mL Intravenous Q12H    Continuous Infusions:    Sharmel Ballantine, DO  [facility] Pager [phone]  If 7PM-7AM, please contact night-coverage [email or web address] Password [email or web address] 11/06/2013, 12:06 PM   LOS: 4 days

## 2013-11-07 DIAGNOSIS — E119 Type 2 diabetes mellitus without complications: Secondary | ICD-10-CM

## 2013-11-07 LAB — GLUCOSE, CAPILLARY
GLUCOSE-CAPILLARY: 166 mg/dL — AB (ref 70–99)
Glucose-Capillary: 220 mg/dL — ABNORMAL HIGH (ref 70–99)
Glucose-Capillary: 122 mg/dL — ABNORMAL HIGH (ref 70–99)

## 2013-11-07 MED ORDER — METFORMIN HCL 500 MG PO TABS: 500.0000 mg | ORAL_TABLET | Freq: Two times a day (BID) | ORAL | Status: DC

## 2013-11-07 MED ORDER — METFORMIN HCL 500 MG PO TABS
500.0000 mg | ORAL_TABLET | Freq: Two times a day (BID) | ORAL | Status: DC
  Administered 2013-11-07: 500 mg via ORAL
  Filled 2013-11-07 (×4): qty 1

## 2013-11-07 MED ORDER — METOPROLOL TARTRATE 25 MG PO TABS: 25.0000 mg | ORAL_TABLET | Freq: Two times a day (BID) | ORAL | Status: DC

## 2013-11-07 MED ORDER — LIVING WELL WITH DIABETES BOOK
Freq: Once | Status: AC
  Administered 2013-11-07: 10:00:00
  Filled 2013-11-07: qty 1

## 2013-11-07 MED ORDER — METOPROLOL TARTRATE 25 MG PO TABS
25.0000 mg | ORAL_TABLET | Freq: Two times a day (BID) | ORAL | Status: DC
  Administered 2013-11-07: 25 mg via ORAL
  Filled 2013-11-07 (×2): qty 1

## 2013-11-07 MED ORDER — INFLUENZA VAC SPLIT QUAD 0.5 ML IM SUSP: 0.5000 mL | INTRAMUSCULAR | Status: DC

## 2013-11-07 MED ORDER — INFLUENZA VAC SPLIT QUAD 0.5 ML IM SUSP
0.5000 mL | INTRAMUSCULAR | Status: AC | PRN
  Administered 2013-11-07: 0.5 mL via INTRAMUSCULAR
  Filled 2013-11-07: qty 0.5

## 2013-11-07 NOTE — Progress Notes (Addendum)
Nutrition Education Note  RD consulted for nutrition education.  RD provided "Hypertension Nutrition Therapy" handout from the Academy of Nutrition and Dietetics. Reviewed patient's dietary recall. Provided examples on ways to decrease sodium intake in diet. Discouraged intake of processed foods and use of salt shaker. Encouraged fresh fruits and vegetables as well as whole grain sources of carbohydrates to maximize fiber intake.   Lab Results  Component Value Date   HGBA1C 6.8* 11/06/2013    RD provided "Carbohydrate Counting for People with Diabetes" handout from the Academy of Nutrition and Dietetics. Handouts reviewed different food groups and their effects on blood sugar, emphasizing carbohydrate-containing foods. Provided list of carbohydrates and recommended serving sizes of common foods.  Pt admits to not eating well PTA, eating out a lot and eating fried foods and other foods high in sodium. Pt reports not adding salt to food at home and plans on using Mrs. Dash after d/c. Pt had just finished talking to diabetic coordinator RD so she denied having any questions regarding diabetic diet.   Expect good compliance.  Body mass index is 49.91 kg/(m^2). 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  Current diet order is heart healthy, patient is consuming approximately 100% of meals at this time. Labs and medications reviewed. No further nutrition interventions warranted at this time. RD contact information provided. If additional nutrition issues arise, please re-consult RD.   Mikey College MS, [place], [place] Pager [phone] After Hours Pager

## 2013-11-07 NOTE — Care Management Note (Unsigned)
    Page 1 of 1   11/07/2013     12:11:16 PM   CARE MANAGEMENT NOTE 11/07/2013  Patient:  Kathryn Mahoney,Kathryn Mahoney   Account Number:  [id number]  Date Initiated:  11/07/2013  Documentation initiated by:  Allene Dillon  Subjective/Objective Assessment:   53 year old female admitted with acute on chronic respiratory failure.     Action/Plan:   From home. Has night time oxygen.   Anticipated DC Date:  11/07/2013   Anticipated DC Plan:  [facility]  CM consult      Choice offered to / List presented to:             Status of service:  Completed, signed off Medicare Important Message given?  NA - LOS <3 / Initial given by admissions (If response is "NO", the following Medicare IM given date fields will be blank) Date Medicare IM given:   Date Additional Medicare IM given:    Discharge Disposition:  HOME/SELF CARE  Per UR Regulation:  Reviewed for med. necessity/level of care/duration of stay  If discussed at [place] of Stay Meetings, dates discussed:    Comments:  11/07/13 Allene Dillon RN BSN Pt needs her home oxygen refilled. I asked her nurse to tell her to call [facility] about it once she gets home. Ialerted Stephenie with [place] and asked her to follow up.

## 2013-11-07 NOTE — Progress Notes (Signed)
Pt discharged home via family; Pt and family given and explained all discharge instructions, carenotes, and prescriptions; pt and family stated understanding and denied questions/concerns; all f/u appointments in place; IV removed without complicaitons; pt stable at time of discharge   Pt arranged with diabetes outpatient classes; pt has resources available for home and states understanding about how to contact resources

## 2013-11-07 NOTE — Progress Notes (Signed)
Inpatient Diabetes Program Recommendations  AACE/ADA: New Consensus Statement on Inpatient Glycemic Control (2013)  Target Ranges:  Prepandial:   less than 140 mg/dL      Peak postprandial:   less than 180 mg/dL (1-2 hours)      Critically ill patients:  140 - 180 mg/dL   Reason for Visit: New-onset DM  53 yo female with h/o htn, copd, chf comes in with 2 days of cough, sob, wheezing, subj fever/chills. Positive family hx DM. Was given SoluMedrol and blood sugars increased. HgbA1C of 6.8% indicating diabetes diagnosis. Pt has family hx DM, has been gaining weight over the past several years. Discussed diagnosis of diabetes, results of HgbA1C, importance of monitoring blood sugars and f/u with PCP. Also discussed metformin, diet, exercise and how each one affects blood sugars. Pt states she is motivated to make changes in diet and exercise. Willing to go to Outpatient Diabetes Education classes. Ordered Living Well With Diabetes book and encouraged pt to view diabetes videos on pt ed channel. Answered questions and gave info on meter at [facility].   Results for ADDISSON, FRATE (MRN [id number]) as of 11/07/2013 10:15  Ref. Range 11/03/2013 22:35 11/06/2013 16:06 11/06/2013 22:07  Glucose-Capillary Latest Range: 70-99 mg/dL 124 (H) 124 (H) 220 (H)  Results for TANESSA, TIDD (MRN [id number]) as of 11/07/2013 10:15  Ref. Range 11/06/2013 12:30  Hemoglobin A1C Latest Range: <5.7 % 6.8 (H)     Thank you. Lorenda Peck, RD, LDN, CDE Inpatient Diabetes Coordinator [phone]

## 2013-11-07 NOTE — Progress Notes (Signed)
Pt oxygen saturations on room air was 95%; pt maintained 93% while ambulating; notified MD

## 2013-11-08 LAB — CULTURE, BLOOD (ROUTINE X 2)
Culture: NO GROWTH
Culture: NO GROWTH

## 2013-12-01 ENCOUNTER — Encounter: Payer: Medicaid Other | Attending: Internal Medicine

## 2013-12-08 ENCOUNTER — Ambulatory Visit: Payer: Medicaid Other

## 2013-12-15 ENCOUNTER — Ambulatory Visit: Payer: Medicaid Other

## 2014-08-12 ENCOUNTER — Emergency Department (HOSPITAL_COMMUNITY)
Admission: EM | Admit: 2014-08-12 | Discharge: 2014-08-12 | Disposition: A | Discharge status: Home / Self Care | Payer: Medicaid Other | Attending: Emergency Medicine | Admitting: Emergency Medicine

## 2014-08-12 ENCOUNTER — Encounter (HOSPITAL_COMMUNITY): Payer: Self-pay | Admitting: Emergency Medicine

## 2014-08-12 DIAGNOSIS — J449 Chronic obstructive pulmonary disease, unspecified: Secondary | ICD-10-CM | POA: Insufficient documentation

## 2014-08-12 DIAGNOSIS — Z79899 Other long term (current) drug therapy: Secondary | ICD-10-CM | POA: Diagnosis not present

## 2014-08-12 DIAGNOSIS — M545 Low back pain, unspecified: Secondary | ICD-10-CM

## 2014-08-12 DIAGNOSIS — Z7952 Long term (current) use of systemic steroids: Secondary | ICD-10-CM | POA: Diagnosis not present

## 2014-08-12 DIAGNOSIS — I10 Essential (primary) hypertension: Secondary | ICD-10-CM | POA: Insufficient documentation

## 2014-08-12 DIAGNOSIS — R109 Unspecified abdominal pain: Secondary | ICD-10-CM | POA: Insufficient documentation

## 2014-08-12 DIAGNOSIS — Z87891 Personal history of nicotine dependence: Secondary | ICD-10-CM | POA: Insufficient documentation

## 2014-08-12 DIAGNOSIS — I509 Heart failure, unspecified: Secondary | ICD-10-CM | POA: Diagnosis not present

## 2014-08-12 LAB — I-STAT CHEM 8, ED
BUN: 14 mg/dL (ref 6–23)
CALCIUM ION: 1.18 mmol/L (ref 1.12–1.23)
Chloride: 106 mEq/L (ref 96–112)
Creatinine, Ser: 0.7 mg/dL (ref 0.50–1.10)
Glucose, Bld: 157 mg/dL — ABNORMAL HIGH (ref 70–99)
HEMATOCRIT: 38 % (ref 36.0–46.0)
HEMOGLOBIN: 12.9 g/dL (ref 12.0–15.0)
Potassium: 3.6 mEq/L — ABNORMAL LOW (ref 3.7–5.3)
Sodium: 142 mEq/L (ref 137–147)
TCO2: 26 mmol/L (ref 0–100)

## 2014-08-12 LAB — URINALYSIS, ROUTINE W REFLEX MICROSCOPIC
Bilirubin Urine: NEGATIVE
Glucose, UA: NEGATIVE mg/dL
HGB URINE DIPSTICK: NEGATIVE
KETONES UR: NEGATIVE mg/dL
Leukocytes, UA: NEGATIVE
Nitrite: NEGATIVE
PH: 6 (ref 5.0–8.0)
Protein, ur: NEGATIVE mg/dL
SPECIFIC GRAVITY, URINE: 1.018 (ref 1.005–1.030)
Urobilinogen, UA: 0.2 mg/dL (ref 0.0–1.0)

## 2014-08-12 LAB — CBC WITH DIFFERENTIAL/PLATELET
BASOS PCT: 0 % (ref 0–1)
Basophils Absolute: 0 10*3/uL (ref 0.0–0.1)
EOS ABS: 0.4 10*3/uL (ref 0.0–0.7)
Eosinophils Relative: 4 % (ref 0–5)
HEMATOCRIT: 36.1 % (ref 36.0–46.0)
HEMOGLOBIN: 11.1 g/dL — AB (ref 12.0–15.0)
LYMPHS PCT: 39 % (ref 12–46)
Lymphs Abs: 3.5 10*3/uL (ref 0.7–4.0)
MCH: 24.3 pg — ABNORMAL LOW (ref 26.0–34.0)
MCHC: 30.7 g/dL (ref 30.0–36.0)
MCV: 79.2 fL (ref 78.0–100.0)
MONOS PCT: 5 % (ref 3–12)
Monocytes Absolute: 0.5 10*3/uL (ref 0.1–1.0)
NEUTROS ABS: 4.6 10*3/uL (ref 1.7–7.7)
NEUTROS PCT: 52 % (ref 43–77)
Platelets: 447 10*3/uL — ABNORMAL HIGH (ref 150–400)
RBC: 4.56 MIL/uL (ref 3.87–5.11)
RDW: 16.9 % — ABNORMAL HIGH (ref 11.5–15.5)
WBC: 9 10*3/uL (ref 4.0–10.5)

## 2014-08-12 LAB — COMPREHENSIVE METABOLIC PANEL
ALBUMIN: 3.4 g/dL — AB (ref 3.5–5.2)
ALK PHOS: 122 U/L — AB (ref 39–117)
ALT: 15 U/L (ref 0–35)
ANION GAP: 13 (ref 5–15)
AST: 11 U/L (ref 0–37)
BUN: 15 mg/dL (ref 6–23)
CHLORIDE: 104 meq/L (ref 96–112)
CO2: 26 mEq/L (ref 19–32)
Calcium: 9.2 mg/dL (ref 8.4–10.5)
Creatinine, Ser: 0.64 mg/dL (ref 0.50–1.10)
GFR calc non Af Amer: >90 mL/min (ref >90)
GLUCOSE: 157 mg/dL — AB (ref 70–99)
Potassium: 3.9 mEq/L (ref 3.7–5.3)
Sodium: 143 mEq/L (ref 137–147)
TOTAL PROTEIN: 7.7 g/dL (ref 6.0–8.3)
Total Bilirubin: <0.2 mg/dL — ABNORMAL LOW (ref 0.3–1.2)

## 2014-08-12 MED ORDER — HYDROCODONE-ACETAMINOPHEN 5-325 MG PO TABS
1.0000 | ORAL_TABLET | Freq: Four times a day (QID) | ORAL | Status: DC | PRN
Start: 2014-08-12 — End: 2016-05-04

## 2014-08-12 MED ORDER — OXYCODONE-ACETAMINOPHEN 5-325 MG PO TABS
1.0000 | ORAL_TABLET | Freq: Once | ORAL | Status: AC
  Administered 2014-08-12: 1 via ORAL
  Filled 2014-08-12: qty 1

## 2014-08-12 NOTE — Discharge Instructions (Signed)
Back Pain, Adult Low back pain is very common. About 1 in 5 people have back pain.The cause of low back pain is rarely dangerous. The pain often gets better over time.About half of people with a sudden onset of back pain feel better in just 2 weeks. About 8 in 10 people feel better by 6 weeks.  CAUSES Some common causes of back pain include:  Strain of the muscles or ligaments supporting the spine.  Wear and tear (degeneration) of the spinal discs.  Arthritis.  Direct injury to the back. DIAGNOSIS Most of the time, the direct cause of low back pain is not known.However, back pain can be treated effectively even when the exact cause of the pain is unknown.Answering your caregiver's questions about your overall health and symptoms is one of the most accurate ways to make sure the cause of your pain is not dangerous. If your caregiver needs more information, he or she may order lab work or imaging tests (X-rays or MRIs).However, even if imaging tests show changes in your back, this usually does not require surgery. HOME CARE INSTRUCTIONS For many people, back pain returns.Since low back pain is rarely dangerous, it is often a condition that people can learn to manageon their own.   Remain active. It is stressful on the back to sit or stand in one place. Do not sit, drive, or stand in one place for more than 30 minutes at a time. Take short walks on level surfaces as soon as pain allows.Try to increase the length of time you walk each day.  Do not stay in bed.Resting more than 1 or 2 days can delay your recovery.  Do not avoid exercise or work.Your body is made to move.It is not dangerous to be active, even though your back may hurt.Your back will likely heal faster if you return to being active before your pain is gone.  Pay attention to your body when you bend and lift. Many people have less discomfortwhen lifting if they bend their knees, keep the load close to their bodies,and  avoid twisting. Often, the most comfortable positions are those that put less stress on your recovering back.  Find a comfortable position to sleep. Use a firm mattress and lie on your side with your knees slightly bent. If you lie on your back, put a pillow under your knees.  Only take over-the-counter or prescription medicines as directed by your caregiver. Over-the-counter medicines to reduce pain and inflammation are often the most helpful.Your caregiver may prescribe muscle relaxant drugs.These medicines help dull your pain so you can more quickly return to your normal activities and healthy exercise.  Put ice on the injured area.  Put ice in a plastic bag.  Place a towel between your skin and the bag.  Leave the ice on for 15-20 minutes, 03-04 times a day for the first 2 to 3 days. After that, ice and heat may be alternated to reduce pain and spasms.  Ask your caregiver about trying back exercises and gentle massage. This may be of some benefit.  Avoid feeling anxious or stressed.Stress increases muscle tension and can worsen back pain.It is important to recognize when you are anxious or stressed and learn ways to manage it.Exercise is a great option. SEEK MEDICAL CARE IF:  You have pain that is not relieved with rest or medicine.  You have pain that does not improve in 1 week.  You have new symptoms.  You are generally not feeling well. SEEK   IMMEDIATE MEDICAL CARE IF:   You have pain that radiates from your back into your legs.  You develop new bowel or bladder control problems.  You have unusual weakness or numbness in your arms or legs.  You develop nausea or vomiting.  You develop abdominal pain.  You feel faint. Document Released: 10/13/2005 Document Revised: 04/13/2012 Document Reviewed: 02/14/2014 Exit[employer] Patient Information 2015 [employer]. This information is not intended to replace advice given to you by your health care provider. Make sure you  discuss any questions you have with your health care provider.  

## 2014-08-12 NOTE — ED Provider Notes (Signed)
CSN: [id number]     Arrival date & time 08/12/14  0128 History   First MD Initiated Contact with Patient 08/12/14 [id number]     Chief Complaint  Patient presents with  . Back Pain  . Flank Pain      HPI Patient reports his had some ongoing left lower back pain x1 week.  She's recently treated for a kidney infection.  He initially had dysuria but reports that has now resolved.  Reports her pain is worse with movement and twisting of her low back.  No fevers or chills.  No new weakness of her arms or legs.  She denies abdominal pain.  Symptoms are mild to moderate in severity.  She has not spoke with her primary care physician about her ongoing discomfort despite antibiotics for UTI/kidney infection last week.  She's completed his antibiotics at this time.   Past Medical History  Diagnosis Date  . COPD (chronic obstructive pulmonary disease)   . CHF (congestive heart failure)   . SOB (shortness of breath)   . Urinary incontinence   . Asthma   . Hypertension    Past Surgical History  Procedure Laterality Date  . Cesarean section    . Ectopic pregnancy surgery    . Knee surgery     Family History  Problem Relation Age of Onset  . Stroke Mother   . Asthma Mother    History  Substance Use Topics  . Smoking status: Former Smoker -- 1.50 packs/day for 30 years    Types: Cigarettes    Quit date: 10/27/2010  . Smokeless tobacco: Never Used  . Alcohol Use: No   OB History   Grav Para Term Preterm Abortions TAB SAB Ect Mult Living   5 4 4  1   1  4      Review of Systems  All other systems reviewed and are negative.     Allergies  Review of patient's allergies indicates no known allergies.  Home Medications   Prior to Admission medications   Medication Sig Start Date End Date Taking? Authorizing Provider  acetaminophen (TYLENOL) 500 MG tablet Take 1,000 mg by mouth every 6 (six) hours as needed (for pain.).   Yes Historical Provider, MD  albuterol (PROVENTIL HFA;VENTOLIN  HFA) 108 (90 BASE) MCG/ACT inhaler Inhale 2 puffs into the lungs every 6 (six) hours as needed for wheezing or shortness of breath. 11/05/13  Yes Orson Eva, MD  albuterol (PROVENTIL) (2.5 MG/3ML) 0.083% nebulizer solution Take 2.5 mg by nebulization every 6 (six) hours as needed for wheezing or shortness of breath.   Yes Historical Provider, MD  amLODipine (NORVASC) 10 MG tablet Take 1 tablet (10 mg total) by mouth daily. 11/05/13  Yes Orson Eva, MD  clotrimazole-betamethasone (LOTRISONE) cream Apply 1 application topically 2 (two) times daily as needed (with baths.).  06/05/14 06/05/15 Yes Historical Provider, MD  metFORMIN (GLUCOPHAGE) 500 MG tablet Take 1 tablet (500 mg total) by mouth 2 (two) times daily with a meal. 11/07/13  Yes Orson Eva, MD  metoprolol succinate (TOPROL-XL) 25 MG 24 hr tablet Take 25 mg by mouth daily.   Yes Historical Provider, MD  HYDROcodone-acetaminophen (NORCO/VICODIN) 5-325 MG per tablet Take 1 tablet by mouth every 6 (six) hours as needed for moderate pain. 08/12/14   Hoy Morn, MD   BP 117/63  Pulse 83  Temp(Src) 98.2 F (36.8 C) (Oral)  Resp 16  Ht 5\' 9"  (1.753 m)  Wt 280 lb (127.007 kg)  BMI  41.33 kg/m2  SpO2 97% Physical Exam  Nursing note and vitals reviewed. Constitutional: She is oriented to person, place, and time. She appears well-developed and well-nourished. No distress.  HENT:  Head: Normocephalic and atraumatic.  Eyes: EOM are normal.  Neck: Normal range of motion.  Cardiovascular: Normal rate, regular rhythm and normal heart sounds.   Pulmonary/Chest: Effort normal and breath sounds normal.  Abdominal: Soft. She exhibits no distension. There is no tenderness.  Musculoskeletal: Normal range of motion.  Mild para lumbar tendernes on the left. No thoracic or lumbar point tenderness. No rash. nml LE strength  Neurological: She is alert and oriented to person, place, and time.  Skin: Skin is warm and dry.  Psychiatric: She has a normal mood and  affect. Judgment normal.    ED Course  Procedures (including critical care time) Labs Review Labs Reviewed  CBC WITH DIFFERENTIAL - Abnormal; Notable for the following:    Hemoglobin 11.1 (*)    MCH 24.3 (*)    RDW 16.9 (*)    Platelets 447 (*)    All other components within normal limits  COMPREHENSIVE METABOLIC PANEL - Abnormal; Notable for the following:    Glucose, Bld 157 (*)    Albumin 3.4 (*)    Alkaline Phosphatase 122 (*)    Total Bilirubin <0.2 (*)    All other components within normal limits  I-STAT CHEM 8, ED - Abnormal; Notable for the following:    Potassium 3.6 (*)    Glucose, Bld 157 (*)    All other components within normal limits  URINALYSIS, ROUTINE W REFLEX MICROSCOPIC    Imaging Review No results found.   EKG Interpretation None      MDM   Final diagnoses:  Left-sided low back pain without sciatica    Labs and urine without significant abnormality.  Abdominal exam benign.  No indication for CT imaging today.  Close PCP followup.  Normal strength in lower extremities.    Hoy Morn, MD 08/12/14 [phone]

## 2014-08-12 NOTE — ED Notes (Signed)
Pt presents w/ c/o lower back pain x one week.  Recently tx for a kidney infection.  Pt denies injury or moving anything heavy.

## 2014-08-28 ENCOUNTER — Encounter (HOSPITAL_COMMUNITY): Payer: Self-pay | Admitting: Emergency Medicine

## 2014-10-21 ENCOUNTER — Emergency Department (HOSPITAL_COMMUNITY)
Admission: EM | Admit: 2014-10-21 | Discharge: 2014-10-21 | Disposition: A | Discharge status: Home / Self Care | Payer: Medicaid Other | Attending: Emergency Medicine | Admitting: Emergency Medicine

## 2014-10-21 ENCOUNTER — Emergency Department (HOSPITAL_COMMUNITY): Payer: Medicaid Other

## 2014-10-21 ENCOUNTER — Encounter (HOSPITAL_COMMUNITY): Payer: Self-pay | Admitting: Oncology

## 2014-10-21 DIAGNOSIS — R350 Frequency of micturition: Secondary | ICD-10-CM | POA: Diagnosis not present

## 2014-10-21 DIAGNOSIS — Z87891 Personal history of nicotine dependence: Secondary | ICD-10-CM | POA: Diagnosis not present

## 2014-10-21 DIAGNOSIS — M545 Low back pain: Secondary | ICD-10-CM | POA: Diagnosis not present

## 2014-10-21 DIAGNOSIS — R319 Hematuria, unspecified: Secondary | ICD-10-CM | POA: Diagnosis not present

## 2014-10-21 DIAGNOSIS — Z79899 Other long term (current) drug therapy: Secondary | ICD-10-CM | POA: Diagnosis not present

## 2014-10-21 DIAGNOSIS — N898 Other specified noninflammatory disorders of vagina: Secondary | ICD-10-CM | POA: Diagnosis present

## 2014-10-21 DIAGNOSIS — M549 Dorsalgia, unspecified: Secondary | ICD-10-CM

## 2014-10-21 DIAGNOSIS — J449 Chronic obstructive pulmonary disease, unspecified: Secondary | ICD-10-CM | POA: Insufficient documentation

## 2014-10-21 DIAGNOSIS — N939 Abnormal uterine and vaginal bleeding, unspecified: Secondary | ICD-10-CM | POA: Insufficient documentation

## 2014-10-21 DIAGNOSIS — I10 Essential (primary) hypertension: Secondary | ICD-10-CM | POA: Insufficient documentation

## 2014-10-21 DIAGNOSIS — I509 Heart failure, unspecified: Secondary | ICD-10-CM | POA: Diagnosis not present

## 2014-10-21 LAB — URINALYSIS, ROUTINE W REFLEX MICROSCOPIC
BILIRUBIN URINE: NEGATIVE
Glucose, UA: NEGATIVE mg/dL
Ketones, ur: NEGATIVE mg/dL
Leukocytes, UA: NEGATIVE
NITRITE: NEGATIVE
PROTEIN: NEGATIVE mg/dL
Specific Gravity, Urine: 1.026 (ref 1.005–1.030)
UROBILINOGEN UA: 1 mg/dL (ref 0.0–1.0)
pH: 6.5 (ref 5.0–8.0)

## 2014-10-21 LAB — WET PREP, GENITAL
Clue Cells Wet Prep HPF POC: NONE SEEN
Trich, Wet Prep: NONE SEEN
Yeast Wet Prep HPF POC: NONE SEEN

## 2014-10-21 LAB — URINE MICROSCOPIC-ADD ON

## 2014-10-21 MED ORDER — MELOXICAM 7.5 MG PO TABS: 7.5000 mg | ORAL_TABLET | Freq: Every day | ORAL | Status: DC

## 2014-10-21 MED ORDER — KETOROLAC TROMETHAMINE 30 MG/ML IJ SOLN
30.0000 mg | Freq: Once | INTRAMUSCULAR | Status: DC
Start: 2014-10-21 — End: 2014-10-21

## 2014-10-21 MED ORDER — KETOROLAC TROMETHAMINE 30 MG/ML IJ SOLN
30.0000 mg | Freq: Once | INTRAMUSCULAR | Status: AC
  Administered 2014-10-21: 30 mg via INTRAMUSCULAR
  Filled 2014-10-21: qty 1

## 2014-10-21 NOTE — ED Notes (Signed)
Pt reports back pain and vaginal discharge x 1 week.

## 2014-10-21 NOTE — Discharge Instructions (Signed)
Degenerative Disk Disease Degenerative disk disease is a condition caused by the changes that occur in the cushions of the backbone (spinal disks) as you grow older. Spinal disks are soft and compressible disks located between the bones of the spine (vertebrae). They act like shock absorbers. Degenerative disk disease can affect the whole spine. However, the neck and lower back are most commonly affected. Many changes can occur in the spinal disks with aging, such as:  The spinal disks may dry and shrink.  Small tears may occur in the tough, outer covering of the disk (annulus).  The disk space may become smaller due to loss of water.  Abnormal growths in the bone (spurs) may occur. This can put pressure on the nerve roots exiting the spinal canal, causing pain.  The spinal canal may become narrowed. CAUSES  Degenerative disk disease is a condition caused by the changes that occur in the spinal disks with aging. The exact cause is not known, but there is a genetic basis for many patients. Degenerative changes can occur due to loss of fluid in the disk. This makes the disk thinner and reduces the space between the backbones. Small cracks can develop in the outer layer of the disk. This can lead to the breakdown of the disk. You are more likely to get degenerative disk disease if you are overweight. Smoking cigarettes and doing heavy work such as weightlifting can also increase your risk of this condition. Degenerative changes can start after a sudden injury. Growth of bone spurs can compress the nerve roots and cause pain.  SYMPTOMS  The symptoms vary from person to person. Some people may have no pain, while others have severe pain. The pain may be so severe that it can limit your activities. The location of the pain depends on the part of your backbone that is affected. You will have neck or arm pain if a disk in the neck area is affected. You will have pain in your back, buttocks, or legs if a disk  in the lower back is affected. The pain becomes worse while bending, reaching up, or with twisting movements. The pain may start gradually and then get worse as time passes. It may also start after a major or minor injury. You may feel numbness or tingling in the arms or legs.  DIAGNOSIS  Your caregiver will ask you about your symptoms and about activities or habits that may cause the pain. He or she may also ask about any injuries, diseases, or treatments you have had earlier. Your caregiver will examine you to check for the range of movement that is possible in the affected area, to check for strength in your extremities, and to check for sensation in the areas of the arms and legs supplied by different nerve roots. An X-ray of the spine may be taken. Your caregiver may suggest other imaging tests, such as magnetic resonance imaging (MRI), if needed.  TREATMENT  Treatment includes rest, modifying your activities, and applying ice and heat. Your caregiver may prescribe medicines to reduce your pain and may ask you to do some exercises to strengthen your back. In some cases, you may need surgery. You and your caregiver will decide on the treatment that is best for you. HOME CARE INSTRUCTIONS   Follow proper lifting and walking techniques as advised by your caregiver.  Maintain good posture.  Exercise regularly as advised.  Perform relaxation exercises.  Change your sitting, standing, and sleeping habits as advised. Change positions  frequently.  Lose weight as advised.  Stop smoking if you smoke.  Wear supportive footwear. SEEK MEDICAL CARE IF:  Your pain does not go away within 1 to 4 weeks. SEEK IMMEDIATE MEDICAL CARE IF:   Your pain is severe.  You notice weakness in your arms, hands, or legs.  You begin to lose control of your bladder or bowel movements. MAKE SURE YOU:   Understand these instructions.  Will watch your condition.  Will get help right away if you are not doing  well or get worse. Document Released: 08/10/2007 Document Revised: 01/05/2012 Document Reviewed: 02/14/2014 [facility] Patient Information 2015 [place], [place]. This information is not intended to replace advice given to you by your health care provider. Make sure you discuss any questions you have with your health care provider.   Emergency Department Resource Guide 1) Find a Doctor and Pay Out of Pocket Although you won't have to find out who is covered by your insurance plan, it is a good idea to ask around and get recommendations. You will then need to call the office and see if the doctor you have chosen will accept you as a new patient and what types of options they offer for patients who are self-pay. Some doctors offer discounts or will set up payment plans for their patients who do not have insurance, but you will need to ask so you aren't surprised when you get to your appointment.  2) Contact Your Local Health Department Not all health departments have doctors that can see patients for sick visits, but many do, so it is worth a call to see if yours does. If you don't know where your local health department is, you can check in your phone book. The CDC also has a tool to help you locate your state's health department, and many state websites also have listings of all of their local health departments.  3) Find a [facility] If your illness is not likely to be very severe or complicated, you may want to try a walk in clinic. These are popping up all over the country in pharmacies, drugstores, and shopping centers. They're usually staffed by nurse practitioners or physician assistants that have been trained to treat common illnesses and complaints. They're usually fairly quick and inexpensive. However, if you have serious medical issues or chronic medical problems, these are probably not your best option.  No Primary Care Doctor: - Call Health Connect at  [phone] - they can help you locate  a primary care doctor that  accepts your insurance, provides certain services, etc. - Physician Referral Service- [phone]  Chronic Pain Problems: Organization         Address  Phone   Notes  [facility]  [phone] Patients need to be referred by their primary care doctor.   Medication Assistance: Organization         Address  Phone   Notes  [facility] Medication [facility] [address] [place], [place] [address] [phone] --Must be a resident of [place] -- Must have NO insurance coverage whatsoever (no Medicaid/ Medicare, etc.) -- The pt. MUST have a primary care doctor that directs their care regularly and follows them in the community   MedAssist  [phone]   [employer]  [phone]    Agencies that provide inexpensive medical care: Organization         Address  Phone   Notes  [facility]  [phone]   [facility]    [phone]   [facility] [place], [place] [address] [phone]   [place]. [address], [place] [phone]   Planned Parenthood    [phone]   [facility]    [phone]   [place] and [place] [address], [place] Phone:  [phone], Fax:  [phone] Hours of Operation:  9 am - 6 pm, M-F.  Also accepts Medicaid/Medicare and self-pay.  [facility] for [place], [address], [place] Phone: [phone], Fax: [phone]. Hours of Operation:  8:30 am - 5:30 pm, M-F.  Also accepts Medicaid and self-pay.  [facility] [place] [address] [place] Phone: [phone]   [place], [place], [place] [phone], Ext. [phone] Mondays & Thursdays: 7-9 AM.  First 15 patients are seen on a first come, first serve basis.    [place]  Providers:  Organization         Address  Phone   Notes  [facility] [address], [place] [phone] Also accepts self-pay patients.  [facility] [address], [place]  [phone]   [place], [address], [place] [phone]   [facility] [address], [place] [phone]   Lucianne Lei [address], [place]   [phone] Only accepts [place] Access [place] patients after they have their name applied to their card.   Self-Pay (no insurance) in [facility]:  Organization         Address  Phone   Notes  Sickle Cell Patients, [facility] Internal Medicine [place] [phone]   [facility] [place] [phone]   [facility] [place]  [place], [place], [employer] [phone]   [facility]/Dr. Osei-Bonsu  [address] [place] or [address], [place] [phone] Phone number for both [place] and [place] locations is the same.  Urgent Medical and [facility] [address] [place] [phone]   [facility] [address], [place] or [address] [phone]   [facility] [address] [place] [phone], phone; [phone], fax Sees patients 1st and 3rd Saturday of every month.  Must not qualify for public or private insurance (i.e. Medicaid, Medicare, [place] Health Choice, Veterans' Benefits)  Household income should be no more than 200% of the poverty level The clinic cannot treat you if you are pregnant or think you are pregnant  Sexually transmitted diseases are not treated at the clinic.    Dental Care: Organization         Address  Phone  Notes  [facility] Department of [facility] [place] [phone] Accepts children up to age 77 who are enrolled in [place] or [place]; pregnant women with a Medicaid card; and children who have applied for Medicaid or [place] Health Choice, but were declined, whose parents can pay a reduced fee at time of service.  [place]  [address], [place] [phone] Accepts children up to age 26 who are enrolled in Medicaid or [place]; pregnant women with a Medicaid card; and children who have applied for Medicaid or [place] Health Choice, but were declined, whose parents can pay a reduced fee at time of service.  [place] Adult Dental Access PROGRAM  [place] [phone] Patients are seen by appointment only. Walk-ins are not accepted. [place] will see patients 18 years of age and older. Monday - Tuesday (8am-5pm) Most Wednesdays (8:30-5pm) $30 per visit, cash only  [facility] Adult Dental Access PROGRAM  [address], [facility] [phone] Patients are seen by appointment only. Walk-ins are not accepted. [place] will see patients 20 years of age and older. One Wednesday Evening (Monthly: Volunteer Based).  $30 per visit, cash only  [place]  [phone] for adults; Children under age 22, call Graduate Pediatric Dentistry at [phone]. Children aged 72-14, please call [phone] to request a pediatric application.  Dental services are provided in all areas of dental care including fillings, crowns and bridges, complete and partial dentures, implants, gum treatment, root canals, and extractions. Preventive care is also provided. Treatment is provided to both adults and children. Patients are selected via a lottery and there is often a waiting list.   [facility] [address], [place]  [phone] [email or web address]   [facility]  [address] [place], [place] [phone], Ext. [phone] Second and Fourth Thursday of each month, opens at 6:30 AM; Clinic ends at 9 AM.  Patients are seen on a first-come first-served basis, and a limited number are seen during each clinic.   [facility]  [address] Hillard Danker [place], [place] [phone]   Eligibility Requirements You must have lived in [place], [place], or [place] counties for at least the last three months.   You cannot be eligible for state or federal sponsored [employer], including [employer], [place], or [employer].   You generally cannot be eligible for healthcare insurance through your employer.    How to apply: Eligibility screenings are held every Tuesday and Wednesday afternoon from 1:00 pm until 4:00 pm. You do not need an appointment for the interview!  [facility] [address] [place], [place]   [place]  [place] Department  [place]  [phone]    Behavioral Health Resources in the Community: Intensive Outpatient Programs Organization         Address  Phone  Notes  [place]. [address], [place], [place] [phone]   [facility] Outpatient [address] [place], [place]   ADS: Alcohol & Drug Svcs [address], [place], [place]   [place] [address]  [place], [place] or [phone]   Substance Abuse Resources Organization         Address  Phone  Notes  Alcohol and Drug Services  [phone]   [place]  [phone]   [facility] [place]   Chinita Pester  [phone]   Residential & Outpatient Substance Abuse Program  [phone]   Psychological Services Organization         Address  Phone  Notes  [place]  [phone]  [place] [address] [place] or [phone]    Mobile Crisis Teams Organization         Address  Phone  Notes  [facility], Mobile Crisis Care Unit  [phone]   [facility] Psychotherapeutic [facility]  [address] [place], [place]   [place] Levels [address] [place] [phone]    Self-Help/Support Groups Organization         Address  Phone             Notes  [place]. of [place] - variety of support groups  [place] Call for more information  Narcotics Anonymous (NA), [facility] [address], [employer] [place]  2 meetings at this location   Special educational needs teacher         Address  Phone  Notes  ASAP Residential Treatment [place],    [place]  [phone]   [facility]  [address], [place] [address], [place], [place]   [place], [place] [phone] Admissions: 8am-3pm M-F  Incentives Substance [place] [address]    [place], [place] [phone]   [facility] [address] [place], [place], [place]   [facility] [address],  [place], [place]   [facility]ight Programs - Intensive Outpatient [address] Kristeen Mans [address], [place], [place]   [facility] ([place].,  [place], [place] [phone] or [phone]   [facility] Services (RTS) [address] [place], [place] Accepts Medicaid  [facility] [place] [address],  [place] [phone] Substance Abuse/Addiction Treatment   [facility] Organization         Address  Phone  Notes  [facility]  [phone]   Domenic Schwab, PhD [address] Arlis [place], [place]   [phone] or [phone]    [place], [place] [phone]   [facility] [address], [place], [place] [phone] Insurance/Medicaid/sponsorship through [facility] and Families [address], [address] [place]                                    [place], [place] [phone] [place] [address], [place] [phone]    Dr. Adele Schilder  [phone]   [facility] Dept. 1) [address] [place] 2) [place] 3)  [place] [address], [place] [phone]  [phone]   [place] [phone] or [phone] (After Hours)

## 2014-10-21 NOTE — ED Provider Notes (Signed)
CSN: [id number]     Arrival date & time 10/21/14  0119 History   First MD Initiated Contact with Patient 10/21/14 [phone]     Chief Complaint  Patient presents with  . Back Pain  . Vaginal Discharge   HPI  Patient is a 53 year old female who presents emergency room for evaluation of multiple complaints. Patient states that she has been having back pain for the past several months. She states that her back pain is midline and right-sided. She states the pain is intermittent and is aching in quality. It is worse when she is standing on her feet for long. The time. If better when she is laying down. She is not currently doing any kind of therapy. Patient also complaining of urinary frequency and urgency. She states that she has had a vaginal discharge but cannot describe the vaginal discharge. She states that she has not seen an OB/GYN in the long time. She has not been sexually active for quite some time. Patient has no history of STDs that she reports. Patient states that she was recently treated for a UTI by her PCP Dr. Marcell Anger several months ago but she feels that she was undertreated. Patient states that she has been having very irregular periods.  Past Medical History  Diagnosis Date  . COPD (chronic obstructive pulmonary disease)   . CHF (congestive heart failure)   . SOB (shortness of breath)   . Urinary incontinence   . Asthma   . Hypertension    Past Surgical History  Procedure Laterality Date  . Cesarean section    . Ectopic pregnancy surgery    . Knee surgery     Family History  Problem Relation Age of Onset  . Stroke Mother   . Asthma Mother    History  Substance Use Topics  . Smoking status: Former Smoker -- 1.50 packs/day for 30 years    Types: Cigarettes    Quit date: 10/27/2010  . Smokeless tobacco: Never Used  . Alcohol Use: No   OB History    Gravida Para Term Preterm AB TAB SAB Ectopic Multiple Living   5 4 4  1   1  4      Review of Systems  Constitutional:  Negative for fever, chills and fatigue.  Respiratory: Negative for chest tightness and shortness of breath.   Cardiovascular: Negative for chest pain and palpitations.  Genitourinary: Positive for frequency, hematuria, vaginal bleeding and vaginal discharge. Negative for dysuria, urgency, decreased urine volume, difficulty urinating, genital sores, vaginal pain and pelvic pain.  All other systems reviewed and are negative.     Allergies  Review of patient's allergies indicates no known allergies.  Home Medications   Prior to Admission medications   Medication Sig Start Date End Date Taking? Authorizing Provider  acetaminophen (TYLENOL) 500 MG tablet Take 1,000 mg by mouth every 6 (six) hours as needed (for pain.).   Yes Historical Provider, MD  albuterol (PROVENTIL HFA;VENTOLIN HFA) 108 (90 BASE) MCG/ACT inhaler Inhale 2 puffs into the lungs every 6 (six) hours as needed for wheezing or shortness of breath. 11/05/13  Yes Orson Eva, MD  albuterol (PROVENTIL) (2.5 MG/3ML) 0.083% nebulizer solution Take 2.5 mg by nebulization every 6 (six) hours as needed for wheezing or shortness of breath.   Yes Historical Provider, MD  amLODipine (NORVASC) 10 MG tablet Take 1 tablet (10 mg total) by mouth daily. 11/05/13  Yes Orson Eva, MD  clotrimazole-betamethasone (LOTRISONE) cream Apply 1 application topically 2 (two)  times daily as needed (with baths.).  06/05/14 06/05/15 Yes Historical Provider, MD  metFORMIN (GLUCOPHAGE) 500 MG tablet Take 1 tablet (500 mg total) by mouth 2 (two) times daily with a meal. 11/07/13  Yes Orson Eva, MD  metoprolol succinate (TOPROL-XL) 25 MG 24 hr tablet Take 25 mg by mouth daily.   Yes Historical Provider, MD  HYDROcodone-acetaminophen (NORCO/VICODIN) 5-325 MG per tablet Take 1 tablet by mouth every 6 (six) hours as needed for moderate pain. 08/12/14   Hoy Morn, MD  meloxicam (MOBIC) 7.5 MG tablet Take 1 tablet (7.5 mg total) by mouth daily. 10/21/14   Caresse Sedivy A  Forcucci, PA-C   BP 139/80 mmHg  Pulse 92  Temp(Src) 98.1 F (36.7 C) (Oral)  Resp 18  SpO2 95%  LMP 10/08/2014 (Approximate) Physical Exam  Constitutional: She is oriented to person, place, and time. She appears well-developed and well-nourished. No distress.  HENT:  Head: Normocephalic and atraumatic.  Mouth/Throat: Oropharynx is clear and moist. No oropharyngeal exudate.  Eyes: Conjunctivae and EOM are normal. Pupils are equal, round, and reactive to light. No scleral icterus.  Neck: Normal range of motion. Neck supple. No JVD present. No thyromegaly present.  Cardiovascular: Normal rate, regular rhythm, normal heart sounds and intact distal pulses.  Exam reveals no gallop and no friction rub.   No murmur heard. Pulmonary/Chest: Effort normal and breath sounds normal. No respiratory distress. She has no wheezes. She has no rales. She exhibits no tenderness.  Abdominal: Soft. Bowel sounds are normal. She exhibits no distension and no mass. There is no tenderness. There is no rebound and no guarding.  Genitourinary: Vagina normal.    No labial fusion. There is no rash, tenderness, lesion or injury on the right labia. There is no rash, tenderness, lesion or injury on the left labia. Cervix exhibits no motion tenderness, no discharge and no friability. Right adnexum displays no mass, no tenderness and no fullness. Left adnexum displays no mass, no tenderness and no fullness. No erythema, tenderness or bleeding in the vagina. No foreign body around the vagina. No signs of injury around the vagina. No vaginal discharge found.  There is blood in the vaginal vault  Musculoskeletal: Normal range of motion.  Patient rises slowly from sitting to standing.  They walk without an antalgic gait.  There is no evidence of erythema, ecchymosis, or gross deformity.  There is tenderness to palpation over lumbar bony spine, and bilateral lumbar paraspinal muscles.  Active ROM is full.  Sensation to light  touch is intact over all extremities.  Strength is symmetric and equal in all extremities.    Lymphadenopathy:    She has no cervical adenopathy.  Neurological: She is alert and oriented to person, place, and time. She has normal strength. No cranial nerve deficit or sensory deficit. Coordination normal.  Skin: Skin is warm and dry. She is not diaphoretic.  Psychiatric: She has a normal mood and affect. Her behavior is normal. Judgment and thought content normal.  Nursing note and vitals reviewed.   ED Course  Procedures (including critical care time) Labs Review Labs Reviewed  WET PREP, GENITAL - Abnormal; Notable for the following:    WBC, Wet Prep HPF POC FEW (*)    All other components within normal limits  URINALYSIS, ROUTINE W REFLEX MICROSCOPIC - Abnormal; Notable for the following:    Hgb urine dipstick LARGE (*)    All other components within normal limits  GC/CHLAMYDIA PROBE AMP  URINE MICROSCOPIC-ADD ON  Imaging Review Dg Lumbar Spine Complete  10/21/2014   CLINICAL DATA:  Acute onset of lower back pain for 1 week. Initial encounter.  EXAM: LUMBAR SPINE - COMPLETE 4+ VIEW  COMPARISON:  CT of the abdomen and pelvis performed 09/18/2013  FINDINGS: There is no evidence of fracture or subluxation. Vertebral bodies demonstrate normal height and alignment. Intervertebral disc spaces are preserved. The visualized neural foramina are grossly unremarkable in appearance.  The visualized bowel gas pattern is unremarkable in appearance; air and stool are noted within the colon. Sclerotic change is noted at the sacroiliac joints.  IMPRESSION: No evidence of fracture or subluxation along the lumbar spine.   Electronically Signed   By: Garald Balding M.D.   On: 10/21/2014 05:15     EKG Interpretation None      MDM   Final diagnoses:  Back pain  Urinary frequency   Patient is a 53 year old female who presents emergency room for evaluation of back pain, and vaginal discharge.  Physical exam reveals no red flags for cauda equina. Urine reveals hematuria. Patient is having vaginal bleeding. There is no significant findings on wet prep were pelvic exam. UA is negative for infection. Chart review reveals degenerative disc disease on CT scan in 2014. Back x-ray reveals no acute changes. Suspect that this is likely related to her degenerative disc disease. I have advised her to lose weight. I will send off urine for culture. I have advised that she follow-up with an OB/GYN of her choosing. I will refer her to the resource list. Patient can also [facility]. We'll start the patient on daily Mobic. Patient has normal serum creatinine in October 2015. Patient stable for discharge at this time.  Patient to return for symptoms of cauda equina. Patient to follow up with her choosing PCP or OB/GYN. Patient discussed with Dr. Marlowe Kays who agrees the above workup and plan.  Cherylann Parr, PA-C 10/21/14 [id number]  Everlene Balls, MD 10/21/14 1357

## 2014-10-23 LAB — GC/CHLAMYDIA PROBE AMP
CT Probe RNA: NEGATIVE
GC PROBE AMP APTIMA: NEGATIVE

## 2014-10-25 ENCOUNTER — Telehealth (HOSPITAL_BASED_OUTPATIENT_CLINIC_OR_DEPARTMENT_OTHER): Payer: Self-pay | Admitting: Emergency Medicine

## 2015-01-27 ENCOUNTER — Emergency Department (HOSPITAL_COMMUNITY)
Admission: EM | Admit: 2015-01-27 | Discharge: 2015-01-27 | Disposition: A | Discharge status: Home / Self Care | Payer: Medicaid Other | Attending: Emergency Medicine | Admitting: Emergency Medicine

## 2015-01-27 DIAGNOSIS — Z87891 Personal history of nicotine dependence: Secondary | ICD-10-CM | POA: Insufficient documentation

## 2015-01-27 DIAGNOSIS — K006 Disturbances in tooth eruption: Secondary | ICD-10-CM | POA: Diagnosis not present

## 2015-01-27 DIAGNOSIS — J449 Chronic obstructive pulmonary disease, unspecified: Secondary | ICD-10-CM | POA: Diagnosis not present

## 2015-01-27 DIAGNOSIS — K029 Dental caries, unspecified: Secondary | ICD-10-CM | POA: Diagnosis not present

## 2015-01-27 DIAGNOSIS — K088 Other specified disorders of teeth and supporting structures: Secondary | ICD-10-CM | POA: Diagnosis present

## 2015-01-27 DIAGNOSIS — K0889 Other specified disorders of teeth and supporting structures: Secondary | ICD-10-CM

## 2015-01-27 DIAGNOSIS — I10 Essential (primary) hypertension: Secondary | ICD-10-CM | POA: Diagnosis not present

## 2015-01-27 DIAGNOSIS — I509 Heart failure, unspecified: Secondary | ICD-10-CM | POA: Insufficient documentation

## 2015-01-27 DIAGNOSIS — Z79899 Other long term (current) drug therapy: Secondary | ICD-10-CM | POA: Insufficient documentation

## 2015-01-27 MED ORDER — BUPIVACAINE-EPINEPHRINE (PF) 0.5% -1:200000 IJ SOLN
1.8000 mL | Freq: Once | INTRAMUSCULAR | Status: DC
  Filled 2015-01-27: qty 1.8

## 2015-01-27 MED ORDER — PENICILLIN V POTASSIUM 500 MG PO TABS: 500.0000 mg | ORAL_TABLET | Freq: Four times a day (QID) | ORAL | Status: AC

## 2015-01-27 MED ORDER — TRAMADOL HCL 50 MG PO TABS: 50.0000 mg | ORAL_TABLET | Freq: Four times a day (QID) | ORAL | Status: DC | PRN

## 2015-01-27 NOTE — ED Provider Notes (Signed)
CSN: [id number]     Arrival date & time 01/27/15  1815 History  This chart was scribed for non-physician practitioner, Jaquita Folds, PA-C,working with Leonard Schwartz, MD, by Marlowe Kays, ED Scribe. This patient was seen in room WTR7/WTR7 and the patient's care was started at 6:56 PM.  Chief Complaint  Patient presents with  . Dental Pain   Patient is a 54 y.o. female presenting with tooth pain. The history is provided by the patient and medical records. No language interpreter was used.  Dental Pain Associated symptoms: facial swelling   Associated symptoms: no fever and no neck pain     HPI Comments:  Kathryn Mahoney is a 54 y.o. obese female who presents to the Emergency Department complaining of severe left lower dental pain that began about two days ago. She reports that she has fractured the tooth "a while ago" but just recently began having pain. She reports associated left-sided facial pain and nausea secondary to taking so much Tylenol and ASA. She states she has taken about 5 Tylenol today. Pt reports putting ASA on the tooth with minimal relief of the pain. She rates her pain at 6 or 7/10. She denies any modifying factors. Denies fever, chills, inability to swallow, neck pain or vomiting, difficulties breathing, chest pain. PMHx of COPD, CHF, asthma, urinary incontinence and HTN. Pt states she cannot take Ibuprofen because it "makes her crazy". Denies allergies to any medication.  Past Medical History  Diagnosis Date  . COPD (chronic obstructive pulmonary disease)   . CHF (congestive heart failure)   . SOB (shortness of breath)   . Urinary incontinence   . Asthma   . Hypertension    Past Surgical History  Procedure Laterality Date  . Cesarean section    . Ectopic pregnancy surgery    . Knee surgery     Family History  Problem Relation Age of Onset  . Stroke Mother   . Asthma Mother    History  Substance Use Topics  . Smoking status: Former Smoker -- 1.50 packs/day for  30 years    Types: Cigarettes    Quit date: 10/27/2010  . Smokeless tobacco: Never Used  . Alcohol Use: No   OB History    Gravida Para Term Preterm AB TAB SAB Ectopic Multiple Living   5 4 4  1   1  4      Review of Systems  Constitutional: Negative for fever and chills.  HENT: Positive for dental problem and facial swelling. Negative for trouble swallowing.   Gastrointestinal: Negative for nausea and vomiting.  Musculoskeletal: Negative for neck pain.  All other systems reviewed and are negative.   Allergies  Review of patient's allergies indicates no known allergies.  Home Medications   Prior to Admission medications   Medication Sig Start Date End Date Taking? Authorizing Provider  acetaminophen (TYLENOL) 500 MG tablet Take 1,000 mg by mouth every 6 (six) hours as needed (for pain.).    Historical Provider, MD  albuterol (PROVENTIL HFA;VENTOLIN HFA) 108 (90 BASE) MCG/ACT inhaler Inhale 2 puffs into the lungs every 6 (six) hours as needed for wheezing or shortness of breath. 11/05/13   Orson Eva, MD  albuterol (PROVENTIL) (2.5 MG/3ML) 0.083% nebulizer solution Take 2.5 mg by nebulization every 6 (six) hours as needed for wheezing or shortness of breath.    Historical Provider, MD  amLODipine (NORVASC) 10 MG tablet Take 1 tablet (10 mg total) by mouth daily. 11/05/13   Orson Eva, MD  clotrimazole-betamethasone (LOTRISONE) cream Apply 1 application topically 2 (two) times daily as needed (with baths.).  06/05/14 06/05/15  Historical Provider, MD  HYDROcodone-acetaminophen (NORCO/VICODIN) 5-325 MG per tablet Take 1 tablet by mouth every 6 (six) hours as needed for moderate pain. 08/12/14   Jola Schmidt, MD  meloxicam (MOBIC) 7.5 MG tablet Take 1 tablet (7.5 mg total) by mouth daily. 10/21/14   Courtney Forcucci, PA-C  metFORMIN (GLUCOPHAGE) 500 MG tablet Take 1 tablet (500 mg total) by mouth 2 (two) times daily with a meal. 11/07/13   Orson Eva, MD  metoprolol succinate (TOPROL-XL) 25 MG  24 hr tablet Take 25 mg by mouth daily.    Historical Provider, MD  penicillin v potassium (VEETID) 500 MG tablet Take 1 tablet (500 mg total) by mouth 4 (four) times daily. 01/27/15 02/03/15  Comer Locket, PA-C  traMADol (ULTRAM) 50 MG tablet Take 1 tablet (50 mg total) by mouth every 6 (six) hours as needed. 01/27/15   Comer Locket, PA-C   Triage Vitals: BP 154/94 mmHg  Pulse 99  Temp(Src) 99.1 F (37.3 C) (Oral)  Resp 16  SpO2 95% Physical Exam  Constitutional: She is oriented to person, place, and time. She appears well-developed and well-nourished.  HENT:  Head: Normocephalic and atraumatic.  Discomfort located to left lower first premolar. Tooth appears to have transverse fracture that is old. Overall poor dentition. Multiple missing teeth with active caries. Mucous membranes are moist. No unilateral tonsillar swelling, uvula midline, no glossal swelling or elevation. No trismus. No fluctuance or evidence of a drainable abscess. No other evidence of emergent infection, Retropharyngeal or Peritonsillar abscess, Ludwig or Vincents angina. Tolerating secretions well. Patent airway   Eyes: EOM are normal.  Neck: Normal range of motion.  Cardiovascular: Normal rate, regular rhythm and normal heart sounds.  Exam reveals no gallop and no friction rub.   No murmur heard. Pulmonary/Chest: Effort normal and breath sounds normal. No respiratory distress. She has no wheezes. She has no rales.  Musculoskeletal: Normal range of motion.  Lymphadenopathy:    She has no cervical adenopathy.  Neurological: She is alert and oriented to person, place, and time.  Skin: Skin is warm and dry.  Psychiatric: She has a normal mood and affect. Her behavior is normal.  Nursing note and vitals reviewed.   ED Course  Procedures (including critical care time) NERVE BLOCK Performed by: Verl Dicker Consent: Verbal consent obtained. Required items: required blood products, implants, devices, and  special equipment available Time out: Immediately prior to procedure a "time out" was called to verify the correct patient, procedure, equipment, support staff and site/side marked as required.  Indication: Dental pain  Nerve block body site: Left inferior alveolar   Preparation: Patient was prepped and draped in the usual sterile fashion. Needle gauge: 24 G Location technique: anatomical landmarks  Local anesthetic: Bupivacaine   Anesthetic total: 1.8 ml  Outcome: pain improved Patient tolerance: Patient tolerated the procedure well with no immediate complications.  DIAGNOSTIC STUDIES: Oxygen Saturation is 95% on RA, adequate by my interpretation.   COORDINATION OF CARE: 7:04 PM- Will administer dental block and prescribe antibiotics. Pt verbalizes understanding and agrees to plan.  Medications - No data to display  Labs Review Labs Reviewed - No data to display  Imaging Review No results found.   EKG Interpretation None     Meds given in ED:  Medications - No data to display  Discharge Medication List as of 01/27/2015  7:30 PM  START taking these medications   Details  penicillin v potassium (VEETID) 500 MG tablet Take 1 tablet (500 mg total) by mouth 4 (four) times daily., Starting 01/27/2015, Until Sat 02/03/15, Print    traMADol (ULTRAM) 50 MG tablet Take 1 tablet (50 mg total) by mouth every 6 (six) hours as needed., Starting 01/27/2015, Until Discontinued, Print       Filed Vitals:   01/27/15 1845  BP: 154/94  Pulse: 99  Temp: 99.1 F (37.3 C)  TempSrc: Oral  Resp: 16  SpO2: 95%    MDM  Vitals stable - WNL -afebrile Pt resting comfortably in ED. reports feeling better after oral nerve block PE-overall poor dentition with chronic tooth fracture. No evidence of acute bacterial infection or other emergent pathology  DDX--patient discharge with antibiotics, pain medicines, and referral to dentistry for definitive care.  I discussed all relevant lab  findings and imaging results with pt and they verbalized understanding. Discussed f/u with PCP within 48 hrs and return precautions, pt very amenable to plan.  Final diagnoses:  Pain, dental      I personally performed the services described in this documentation, which was scribed in my presence. The recorded information has been reviewed and is accurate.    Comer Locket, PA-C 01/28/15 1205  Leonard Schwartz, MD 02/01/15 [phone]

## 2015-01-27 NOTE — ED Notes (Signed)
Pt c/o L lower dental pain x 2 days. States she has an infected tooth that probably needs to be pulled. Pt states she has taken a lot of ASA and Tylenol and now is feeling nauseous. Pt is requesting a shot of pain medicine in her mouth. Pt has no acute distress.

## 2015-01-27 NOTE — Discharge Instructions (Signed)
Dental Pain °A tooth ache may be caused by cavities (tooth decay). Cavities expose the nerve of the tooth to air and hot or cold temperatures. It may come from an infection or abscess (also called a boil or furuncle) around your tooth. It is also often caused by dental caries (tooth decay). This causes the pain you are having. °DIAGNOSIS  °Your caregiver can diagnose this problem by exam. °TREATMENT  °· If caused by an infection, it may be treated with medications which kill germs (antibiotics) and pain medications as prescribed by your caregiver. Take medications as directed. °· Only take over-the-counter or prescription medicines for pain, discomfort, or fever as directed by your caregiver. °· Whether the tooth ache today is caused by infection or dental disease, you should see your dentist as soon as possible for further care. °SEEK MEDICAL CARE IF: °The exam and treatment you received today has been provided on an emergency basis only. This is not a substitute for complete medical or dental care. If your problem worsens or new problems (symptoms) appear, and you are unable to meet with your dentist, call or return to this location. °SEEK IMMEDIATE MEDICAL CARE IF:  °· You have a fever. °· You develop redness and swelling of your face, jaw, or neck. °· You are unable to open your mouth. °· You have severe pain uncontrolled by pain medicine. °MAKE SURE YOU:  °· Understand these instructions. °· Will watch your condition. °· Will get help right away if you are not doing well or get worse. °Document Released: 10/13/2005 Document Revised: 01/05/2012 Document Reviewed: 05/31/2008 °[employer]® Patient Information ©2015 [employer]. This information is not intended to replace advice given to you by your health care provider. Make sure you discuss any questions you have with your health care provider. ° °Dental Care and Dentist Visits °Dental care supports good overall health. Regular dental visits can also help you  avoid dental pain, bleeding, infection, and other more serious health problems in the future. It is important to keep the mouth healthy because diseases in the teeth, gums, and other oral tissues can spread to other areas of the body. Some problems, such as diabetes, heart disease, and pre-term labor have been associated with poor oral health.  °See your dentist every 6 months. If you experience emergency problems such as a toothache or broken tooth, go to the dentist right away. If you see your dentist regularly, you may catch problems early. It is easier to be treated for problems in the early stages.  °WHAT TO EXPECT AT A DENTIST VISIT  °Your dentist will look for many common oral health problems and recommend proper treatment. At your regular dental visit, you can expect: °· Gentle cleaning of the teeth and gums. This includes scraping and polishing. This helps to remove the sticky substance around the teeth and gums (plaque). Plaque forms in the mouth shortly after eating. Over time, plaque hardens on the teeth as tartar. If tartar is not removed regularly, it can cause problems. Cleaning also helps remove stains. °· Periodic X-rays. These pictures of the teeth and supporting bone will help your dentist assess the health of your teeth. °· Periodic fluoride treatments. Fluoride is a natural mineral shown to help strengthen teeth. Fluoride treatment involves applying a fluoride gel or varnish to the teeth. It is most commonly done in children. °· Examination of the mouth, tongue, jaws, teeth, and gums to look for any oral health problems, such as: °¨ Cavities (dental caries). This is   decay on the tooth caused by plaque, sugar, and acid in the mouth. It is best to catch a cavity when it is small. °¨ Inflammation of the gums caused by plaque buildup (gingivitis). °¨ Problems with the mouth or malformed or misaligned teeth. °¨ Oral cancer or other diseases of the soft tissues or jaws.  °KEEP YOUR TEETH AND GUMS  HEALTHY °For healthy teeth and gums, follow these general guidelines as well as your dentist's specific advice: °· Have your teeth professionally cleaned at the dentist every 6 months. °· Brush twice daily with a fluoride toothpaste. °· Floss your teeth daily.  °· Ask your dentist if you need fluoride supplements, treatments, or fluoride toothpaste. °· Eat a healthy diet. Reduce foods and drinks with added sugar. °· Avoid smoking. °TREATMENT FOR ORAL HEALTH PROBLEMS °If you have oral health problems, treatment varies depending on the conditions present in your teeth and gums. °· Your caregiver will most likely recommend good oral hygiene at each visit. °· For cavities, gingivitis, or other oral health disease, your caregiver will perform a procedure to treat the problem. This is typically done at a separate appointment. Sometimes your caregiver will refer you to another dental specialist for specific tooth problems or for surgery. °SEEK IMMEDIATE DENTAL CARE IF: °· You have pain, bleeding, or soreness in the gum, tooth, jaw, or mouth area. °· A permanent tooth becomes loose or separated from the gum socket. °· You experience a blow or injury to the mouth or jaw area. °Document Released: 06/25/2011 Document Revised: 01/05/2012 Document Reviewed: 06/25/2011 °Exit[employer]® Patient Information ©2015 [employer]. This information is not intended to replace advice given to you by your health care provider. Make sure you discuss any questions you have with your health care provider. ° °

## 2015-06-17 ENCOUNTER — Emergency Department (HOSPITAL_COMMUNITY)
Admission: EM | Admit: 2015-06-17 | Discharge: 2015-06-17 | Disposition: A | Discharge status: Home / Self Care | Payer: Medicaid Other | Attending: Emergency Medicine | Admitting: Emergency Medicine

## 2015-06-17 ENCOUNTER — Emergency Department (HOSPITAL_BASED_OUTPATIENT_CLINIC_OR_DEPARTMENT_OTHER)
Admit: 2015-06-17 | Discharge: 2015-06-17 | Disposition: A | Discharge status: Home / Self Care | Payer: Medicaid Other | Attending: Emergency Medicine | Admitting: Emergency Medicine

## 2015-06-17 ENCOUNTER — Encounter (HOSPITAL_COMMUNITY): Payer: Self-pay | Admitting: *Deleted

## 2015-06-17 DIAGNOSIS — M79609 Pain in unspecified limb: Secondary | ICD-10-CM

## 2015-06-17 DIAGNOSIS — Z87891 Personal history of nicotine dependence: Secondary | ICD-10-CM | POA: Diagnosis not present

## 2015-06-17 DIAGNOSIS — S8992XA Unspecified injury of left lower leg, initial encounter: Secondary | ICD-10-CM | POA: Diagnosis present

## 2015-06-17 DIAGNOSIS — I10 Essential (primary) hypertension: Secondary | ICD-10-CM | POA: Diagnosis not present

## 2015-06-17 DIAGNOSIS — Y9289 Other specified places as the place of occurrence of the external cause: Secondary | ICD-10-CM | POA: Insufficient documentation

## 2015-06-17 DIAGNOSIS — I509 Heart failure, unspecified: Secondary | ICD-10-CM | POA: Insufficient documentation

## 2015-06-17 DIAGNOSIS — Y998 Other external cause status: Secondary | ICD-10-CM | POA: Diagnosis not present

## 2015-06-17 DIAGNOSIS — X58XXXA Exposure to other specified factors, initial encounter: Secondary | ICD-10-CM | POA: Insufficient documentation

## 2015-06-17 DIAGNOSIS — M25562 Pain in left knee: Secondary | ICD-10-CM

## 2015-06-17 DIAGNOSIS — S86912A Strain of unspecified muscle(s) and tendon(s) at lower leg level, left leg, initial encounter: Secondary | ICD-10-CM

## 2015-06-17 DIAGNOSIS — Y9389 Activity, other specified: Secondary | ICD-10-CM | POA: Diagnosis not present

## 2015-06-17 DIAGNOSIS — S86812A Strain of other muscle(s) and tendon(s) at lower leg level, left leg, initial encounter: Secondary | ICD-10-CM | POA: Diagnosis not present

## 2015-06-17 DIAGNOSIS — Z79899 Other long term (current) drug therapy: Secondary | ICD-10-CM | POA: Insufficient documentation

## 2015-06-17 DIAGNOSIS — J449 Chronic obstructive pulmonary disease, unspecified: Secondary | ICD-10-CM | POA: Insufficient documentation

## 2015-06-17 DIAGNOSIS — Z791 Long term (current) use of non-steroidal anti-inflammatories (NSAID): Secondary | ICD-10-CM | POA: Diagnosis not present

## 2015-06-17 MED ORDER — IBUPROFEN 800 MG PO TABS
800.0000 mg | ORAL_TABLET | Freq: Once | ORAL | Status: AC
  Administered 2015-06-17: 800 mg via ORAL
  Filled 2015-06-17: qty 1

## 2015-06-17 NOTE — Discharge Instructions (Signed)

## 2015-06-17 NOTE — Progress Notes (Signed)
VASCULAR LAB PRELIMINARY  PRELIMINARY  PRELIMINARY  PRELIMINARY  Left lower extremity venous duplex completed.    Preliminary report:  There is no DVT or SVT noted in the left lower extremity or at the site of pain the right calf.   Yanel Dombrosky, RVT 06/17/2015, 12:12 PM

## 2015-06-17 NOTE — ED Notes (Signed)
Pt reports Left knee pain x3 days. Sts she got up out of bed and it "gave way." Has not improved with bengay use.

## 2015-06-17 NOTE — ED Provider Notes (Signed)
CSN: [id number]     Arrival date & time 06/17/15  4270 History   First MD Initiated Contact with Patient 06/17/15 [phone]     Chief Complaint  Patient presents with  . Leg Pain     (Consider location/radiation/quality/duration/timing/severity/associated sxs/prior Treatment) Patient is a 54 y.o. female presenting with knee pain.  Knee Pain Location:  Knee Time since incident:  3 days Injury: yes   Mechanism of injury comment:  Standing up Knee location:  L knee Pain details:    Quality:  Aching   Radiates to:  Does not radiate   Severity:  Moderate   Onset quality:  Sudden   Duration:  3 days   Timing:  Constant   Progression:  Unchanged Foreign body present:  No foreign bodies Prior injury to area:  Yes Relieved by:  Nothing Worsened by:  Bearing weight, flexion and extension Associated symptoms: tingling (posterior popliteal)   Associated symptoms: no back pain and no numbness     Past Medical History  Diagnosis Date  . COPD (chronic obstructive pulmonary disease)   . CHF (congestive heart failure)   . SOB (shortness of breath)   . Urinary incontinence   . Asthma   . Hypertension    Past Surgical History  Procedure Laterality Date  . Cesarean section    . Ectopic pregnancy surgery    . Knee surgery     Family History  Problem Relation Age of Onset  . Stroke Mother   . Asthma Mother    Social History  Substance Use Topics  . Smoking status: Former Smoker -- 1.50 packs/day for 30 years    Types: Cigarettes    Quit date: 10/27/2010  . Smokeless tobacco: Never Used  . Alcohol Use: No   OB History    Gravida Para Term Preterm AB TAB SAB Ectopic Multiple Living   5 4 4  1   1  4      Review of Systems  Musculoskeletal: Negative for back pain.  All other systems reviewed and are negative.     Allergies  Review of patient's allergies indicates no known allergies.  Home Medications   Prior to Admission medications   Medication Sig Start Date End Date  Taking? Authorizing Provider  acetaminophen (TYLENOL) 500 MG tablet Take 1,000 mg by mouth daily.    Yes Historical Provider, MD  albuterol (PROVENTIL HFA;VENTOLIN HFA) 108 (90 BASE) MCG/ACT inhaler Inhale 2 puffs into the lungs every 6 (six) hours as needed for wheezing or shortness of breath. 11/05/13  Yes Orson Eva, MD  albuterol (PROVENTIL) (2.5 MG/3ML) 0.083% nebulizer solution Take 2.5 mg by nebulization every 6 (six) hours as needed for wheezing or shortness of breath.   Yes Historical Provider, MD  amLODipine (NORVASC) 10 MG tablet Take 1 tablet (10 mg total) by mouth daily. 11/05/13  Yes Orson Eva, MD  hydrochlorothiazide (MICROZIDE) 12.5 MG capsule Take 1 capsule by mouth daily as needed. Swelling. 02/01/15  Yes Historical Provider, MD  meloxicam (MOBIC) 7.5 MG tablet Take 1 tablet (7.5 mg total) by mouth daily. 10/21/14  Yes Courtney Forcucci, PA-C  metFORMIN (GLUCOPHAGE) 500 MG tablet Take 1 tablet (500 mg total) by mouth 2 (two) times daily with a meal. 11/07/13  Yes Orson Eva, MD  metoprolol succinate (TOPROL-XL) 25 MG 24 hr tablet Take 25 mg by mouth daily.   Yes Historical Provider, MD  topiramate (TOPAMAX) 100 MG tablet Take 200 mg by mouth at bedtime. 08/14/14  Yes Historical Provider,  MD  traMADol (ULTRAM) 50 MG tablet Take 1 tablet (50 mg total) by mouth every 6 (six) hours as needed. Patient taking differently: Take 50 mg by mouth every 6 (six) hours as needed for moderate pain.  01/27/15  Yes Comer Locket, PA-C  HYDROcodone-acetaminophen (NORCO/VICODIN) 5-325 MG per tablet Take 1 tablet by mouth every 6 (six) hours as needed for moderate pain. Patient not taking: Reported on 06/17/2015 08/12/14   Jola Schmidt, MD   BP 169/92 mmHg  Pulse 79  Temp(Src) 97.8 F (36.6 C) (Oral)  Resp 16  SpO2 99%  LMP 03/17/2015 Physical Exam  Constitutional: She is oriented to person, place, and time. She appears well-developed and well-nourished.  HENT:  Head: Normocephalic and atraumatic.   Right Ear: External ear normal.  Left Ear: External ear normal.  Eyes: Conjunctivae and EOM are normal. Pupils are equal, round, and reactive to light.  Neck: Normal range of motion. Neck supple.  Cardiovascular: Normal rate, regular rhythm, normal heart sounds and intact distal pulses.   Pulmonary/Chest: Effort normal and breath sounds normal.  Abdominal: Soft. Bowel sounds are normal. There is no tenderness.  Musculoskeletal: Normal range of motion.       Left knee: She exhibits swelling. She exhibits normal range of motion and no bony tenderness. Tenderness (diffuse) found.       Left lower leg: She exhibits tenderness and swelling. She exhibits no bony tenderness.  Neurological: She is alert and oriented to person, place, and time.  Skin: Skin is warm and dry.  Vitals reviewed.   ED Course  Procedures (including critical care time) Labs Review Labs Reviewed - No data to display  Imaging Review No results found. I have personally reviewed and evaluated these images and lab results as part of my medical decision-making.   EKG Interpretation None      MDM   Final diagnoses:  Knee pain, acute, left  Knee strain, left, initial encounter    54 y.o. female with pertinent PMH of COPD, CHF, HTN presents with L knee swelling and pain x 3 days as above.  No recent surgeries, other risk factors for DVT.  On arrival vitals and physical exam as above.  PT has noted swelling to L calf with tenderness.  This is likely due to prior ho surgery on that side, however pt cannot endorse that this was new.  DVT US unremarkable.  Likely strain as symptoms began immediately on standing with stress of joint.  DC home in stable condition  I have reviewed all laboratory and imaging studies if ordered as above  1. Knee pain, acute, left   2. Knee strain, left, initial encounter         Debby Freiberg, MD 06/19/15 [phone]

## 2015-07-26 ENCOUNTER — Emergency Department (HOSPITAL_COMMUNITY): Payer: Medicaid Other

## 2015-07-26 ENCOUNTER — Emergency Department (HOSPITAL_COMMUNITY)
Admission: EM | Admit: 2015-07-26 | Discharge: 2015-07-26 | Disposition: A | Discharge status: Home / Self Care | Payer: Medicaid Other | Attending: Emergency Medicine | Admitting: Emergency Medicine

## 2015-07-26 ENCOUNTER — Encounter (HOSPITAL_COMMUNITY): Payer: Self-pay | Admitting: *Deleted

## 2015-07-26 DIAGNOSIS — I10 Essential (primary) hypertension: Secondary | ICD-10-CM | POA: Diagnosis not present

## 2015-07-26 DIAGNOSIS — Z9889 Other specified postprocedural states: Secondary | ICD-10-CM | POA: Diagnosis not present

## 2015-07-26 DIAGNOSIS — I509 Heart failure, unspecified: Secondary | ICD-10-CM | POA: Insufficient documentation

## 2015-07-26 DIAGNOSIS — Z79899 Other long term (current) drug therapy: Secondary | ICD-10-CM | POA: Diagnosis not present

## 2015-07-26 DIAGNOSIS — M25562 Pain in left knee: Secondary | ICD-10-CM | POA: Diagnosis not present

## 2015-07-26 DIAGNOSIS — J449 Chronic obstructive pulmonary disease, unspecified: Secondary | ICD-10-CM | POA: Diagnosis not present

## 2015-07-26 DIAGNOSIS — Z791 Long term (current) use of non-steroidal anti-inflammatories (NSAID): Secondary | ICD-10-CM | POA: Diagnosis not present

## 2015-07-26 DIAGNOSIS — Z87891 Personal history of nicotine dependence: Secondary | ICD-10-CM | POA: Insufficient documentation

## 2015-07-26 MED ORDER — ACETAMINOPHEN 325 MG PO TABS: 325.0000 mg | ORAL_TABLET | Freq: Once | ORAL | Status: DC

## 2015-07-26 NOTE — ED Notes (Signed)
Declined W/C at D/C and was escorted to lobby by RN. 

## 2015-07-26 NOTE — ED Provider Notes (Signed)
CSN: [id number]     Arrival date & time 07/26/15  0820 History   First MD Initiated Contact with Patient 07/26/15 0831     Chief Complaint  Patient presents with  . Leg Pain   HPI   54 year old female presents today with leg pain. Patient was seen on 06/17/2015 originally for this complaint. She reports that she was standing up and felt a sharp pain in her left knee that persisted. She was seen in the emergency room, DVT rule out with ultrasound, crutches, knee immobilizer. Patient reports since that time knee has slightly improved, she is able ambulate but has a limp. She reports the pain continues to be sharp in nature, worse with standing, relieved with rest, located on the left medial knee. Patient reports that she has used Tylenol and ibuprofen as needed for the pain. Patient goes on to note a history of knee surgery with left in hardware. Patient denies any signs of infection.  Past Medical History  Diagnosis Date  . COPD (chronic obstructive pulmonary disease)   . CHF (congestive heart failure)   . SOB (shortness of breath)   . Urinary incontinence   . Asthma   . Hypertension    Past Surgical History  Procedure Laterality Date  . Cesarean section    . Ectopic pregnancy surgery    . Knee surgery     Family History  Problem Relation Age of Onset  . Stroke Mother   . Asthma Mother    Social History  Substance Use Topics  . Smoking status: Former Smoker -- 1.50 packs/day for 30 years    Types: Cigarettes    Quit date: 10/27/2010  . Smokeless tobacco: Never Used  . Alcohol Use: No   OB History    Gravida Para Term Preterm AB TAB SAB Ectopic Multiple Living   5 4 4  1   1  4      Review of Systems  All other systems reviewed and are negative.   Allergies  Review of patient's allergies indicates no known allergies.  Home Medications   Prior to Admission medications   Medication Sig Start Date End Date Taking? Authorizing Provider  acetaminophen (TYLENOL) 500 MG  tablet Take 1,000 mg by mouth daily.     Historical Provider, MD  albuterol (PROVENTIL HFA;VENTOLIN HFA) 108 (90 BASE) MCG/ACT inhaler Inhale 2 puffs into the lungs every 6 (six) hours as needed for wheezing or shortness of breath. 11/05/13   Orson Eva, MD  albuterol (PROVENTIL) (2.5 MG/3ML) 0.083% nebulizer solution Take 2.5 mg by nebulization every 6 (six) hours as needed for wheezing or shortness of breath.    Historical Provider, MD  amLODipine (NORVASC) 10 MG tablet Take 1 tablet (10 mg total) by mouth daily. 11/05/13   Orson Eva, MD  hydrochlorothiazide (MICROZIDE) 12.5 MG capsule Take 1 capsule by mouth daily as needed. Swelling. 02/01/15   Historical Provider, MD  HYDROcodone-acetaminophen (NORCO/VICODIN) 5-325 MG per tablet Take 1 tablet by mouth every 6 (six) hours as needed for moderate pain. Patient not taking: Reported on 06/17/2015 08/12/14   Jola Schmidt, MD  meloxicam (MOBIC) 7.5 MG tablet Take 1 tablet (7.5 mg total) by mouth daily. 10/21/14   Courtney Forcucci, PA-C  metFORMIN (GLUCOPHAGE) 500 MG tablet Take 1 tablet (500 mg total) by mouth 2 (two) times daily with a meal. 11/07/13   Orson Eva, MD  metoprolol succinate (TOPROL-XL) 25 MG 24 hr tablet Take 25 mg by mouth daily.    Historical  Provider, MD  topiramate (TOPAMAX) 100 MG tablet Take 200 mg by mouth at bedtime. 08/14/14   Historical Provider, MD  traMADol (ULTRAM) 50 MG tablet Take 1 tablet (50 mg total) by mouth every 6 (six) hours as needed. Patient taking differently: Take 50 mg by mouth every 6 (six) hours as needed for moderate pain.  01/27/15   Benjamin Cartner, PA-C   BP 164/109 mmHg  Pulse 100  Temp(Src) 98 F (36.7 C) (Oral)  Resp 20  Ht 5\' 9"  (1.753 m)  Wt 280 lb (127.007 kg)  BMI 41.33 kg/m2  SpO2 100% Physical Exam  Constitutional: She is oriented to person, place, and time. She appears well-developed and well-nourished.  HENT:  Head: Normocephalic and atraumatic.  Eyes: Conjunctivae are normal. Pupils are  equal, round, and reactive to light. Right eye exhibits no discharge. Left eye exhibits no discharge. No scleral icterus.  Neck: Normal range of motion. No JVD present. No tracheal deviation present.  Pulmonary/Chest: Effort normal. No stridor.  Musculoskeletal:  Knees equal bilateral, no obvious signs of swelling, infection, deformity. Nontender to palpation of the knee, pain with valgus stress, no pain with varus or laxity, Lachman's and negative. Remainder of extremity is normal with no obvious swelling or edema. Patient able to ambulate with minimal difficulty with  Neurological: She is alert and oriented to person, place, and time. Coordination normal.  Psychiatric: She has a normal mood and affect. Her behavior is normal. Judgment and thought content normal.  Nursing note and vitals reviewed.     ED Course  Procedures (including critical care time) Labs Review Labs Reviewed - No data to display  Imaging Review Dg Knee Complete 4 Views Left  07/26/2015   CLINICAL DATA:  Knee pain. Initial encounter. Acute onset of LEFT knee pain. Remote history of operation on the knee.  EXAM: LEFT KNEE - COMPLETE 4+ VIEW  COMPARISON:  None.  FINDINGS: Jodell Cipro are present in the tibia, likely for correction of tibial bowing. Ankylosis across the proximal tibiofibular joint. There is tricompartmental osteoarthritis that moderate tricompartmental osteoarthritis of the knee is present, worst in the medial compartment. No effusion. Mild persistent tibial bowing although the alignment of the knee is near anatomic. Chondrocalcinosis of the lateral meniscus. No fracture or acute osseous abnormality. Patella located.  IMPRESSION: 1. Tricompartmental osteoarthritis of the knee, worst in the medial compartment. 2. No acute osseous abnormality. 3. Postoperative changes of the proximal leg.   Electronically Signed   By: Dereck Ligas M.D.   On: 07/26/2015 09:31   I have personally reviewed and evaluated these images  and lab results as part of my medical decision-making.   EKG Interpretation None      MDM   Final diagnoses:  Left knee pain    Labs:  Imaging: DG knee- no acute findings  Consults:  Therapeutics:  Discharge Meds:   Assessment/Plan: Patient presents with chronic knee pain, likely arthritic in nature. No signs of septic or gouty arthritis. No signs or symptoms that would necessitate further evaluation or management here in the ED setting. Patient instructed follow-up with orthopedic surgeon for further evaluation and management. She was given strict return precautions, verbalized understanding and agreement today's plan and had no further questions or concerns at the time of discharge         Okey Regal, PA-C 07/26/15 [place], MD 07/27/15 [phone]

## 2015-07-26 NOTE — Discharge Instructions (Signed)
Arthralgia °Your caregiver has diagnosed you as suffering from an arthralgia. Arthralgia means there is pain in a joint. This can come from many reasons including: °· Bruising the joint which causes soreness (inflammation) in the joint. °· Wear and tear on the joints which occur as we grow older (osteoarthritis). °· Overusing the joint. °· Various forms of arthritis. °· Infections of the joint. °Regardless of the cause of pain in your joint, most of these different pains respond to anti-inflammatory drugs and rest. The exception to this is when a joint is infected, and these cases are treated with antibiotics, if it is a bacterial infection. °HOME CARE INSTRUCTIONS  °· Rest the injured area for as long as directed by your caregiver. Then slowly start using the joint as directed by your caregiver and as the pain allows. Crutches as directed may be useful if the ankles, knees or hips are involved. If the knee was splinted or casted, continue use and care as directed. If an stretchy or elastic wrapping bandage has been applied today, it should be removed and re-applied every 3 to 4 hours. It should not be applied tightly, but firmly enough to keep swelling down. Watch toes and feet for swelling, bluish discoloration, coldness, numbness or excessive pain. If any of these problems (symptoms) occur, remove the ace bandage and re-apply more loosely. If these symptoms persist, contact your caregiver or return to this location. °· For the first 24 hours, keep the injured extremity elevated on pillows while lying down. °· Apply ice for 15-20 minutes to the sore joint every couple hours while awake for the first half day. Then 03-04 times per day for the first 48 hours. Put the ice in a plastic bag and place a towel between the bag of ice and your skin. °· Wear any splinting, casting, elastic bandage applications, or slings as instructed. °· Only take over-the-counter or prescription medicines for pain, discomfort, or fever as  directed by your caregiver. Do not use aspirin immediately after the injury unless instructed by your physician. Aspirin can cause increased bleeding and bruising of the tissues. °· If you were given crutches, continue to use them as instructed and do not resume weight bearing on the sore joint until instructed. °Persistent pain and inability to use the sore joint as directed for more than 2 to 3 days are warning signs indicating that you should see a caregiver for a follow-up visit as soon as possible. Initially, a hairline fracture (break in bone) may not be evident on X-rays. Persistent pain and swelling indicate that further evaluation, non-weight bearing or use of the joint (use of crutches or slings as instructed), or further X-rays are indicated. X-rays may sometimes not show a small fracture until a week or 10 days later. Make a follow-up appointment with your own caregiver or one to whom we have referred you. A radiologist (specialist in reading X-rays) may read your X-rays. Make sure you know how you are to obtain your X-ray results. Do not assume everything is normal if you do not hear from us. °SEEK MEDICAL CARE IF: °Bruising, swelling, or pain increases. °SEEK IMMEDIATE MEDICAL CARE IF:  °· Your fingers or toes are numb or blue. °· The pain is not responding to medications and continues to stay the same or get worse. °· The pain in your joint becomes severe. °· You develop a fever over 102° F (38.9° C). °· It becomes impossible to move or use the joint. °MAKE SURE YOU:  °·   Understand these instructions.  Will watch your condition.  Will get help right away if you are not doing well or get worse. Document Released: 10/13/2005 Document Revised: 01/05/2012 Document Reviewed: 05/31/2008 [facility] Patient Information 2015 [place], [place]. This information is not intended to replace advice given to you by your health care provider. Make sure you discuss any questions you have with your health care  provider.  Please follow-up with orthopedic surgeon for further evaluation and management of your ongoing knee pain. Please use Tylenol as needed for pain.

## 2015-07-26 NOTE — ED Notes (Addendum)
PT refused W/C at D/C and meds ordered

## 2015-07-26 NOTE — ED Notes (Signed)
PT reports pain to LT leg . Pain has been present 1.5 years. PT reports a leg surgery in past and clips were left in Lower leg. PT feels like the clips are causesing the leg pain.

## 2015-10-08 IMAGING — CT CT ABD-PELV W/O CM
1 series · 15 of 28 positions shown, 19 images · non-contrast
Comparison: Pelvic ultrasound on 05/15/2013.

CLINICAL DATA: Left-sided flank pain and hematuria.

EXAM:
CT ABDOMEN AND PELVIS WITHOUT
TECHNIQUE: Multidetector CT imaging of the abdomen and pelvis was performed
following the standard protocol without IV contrast.

[Series 4: lung · axial · 0.97mm/px · z∈[-160,-40]mm · 15 of 28 slices shown, 19 images]
[im 3/28  soft-tissue]
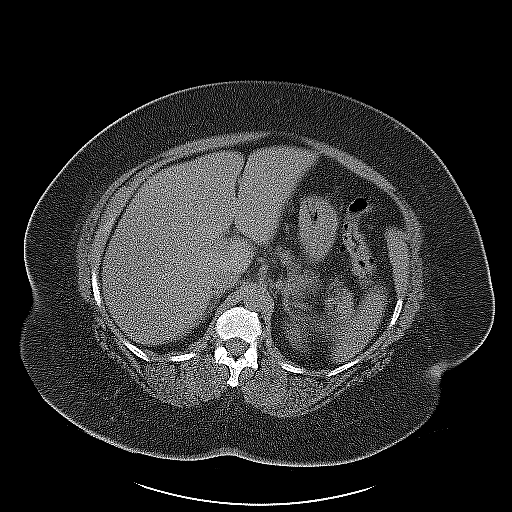
[im 3/28  bone]
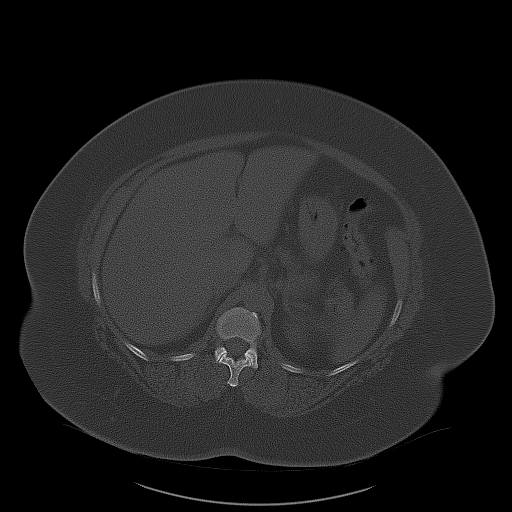
[im 5/28  soft-tissue]
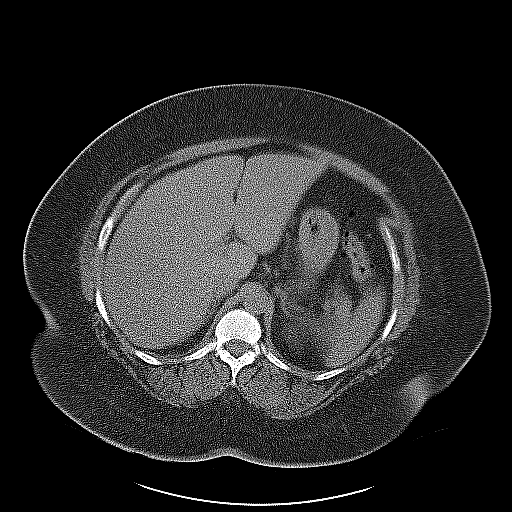
[im 7/28  soft-tissue]
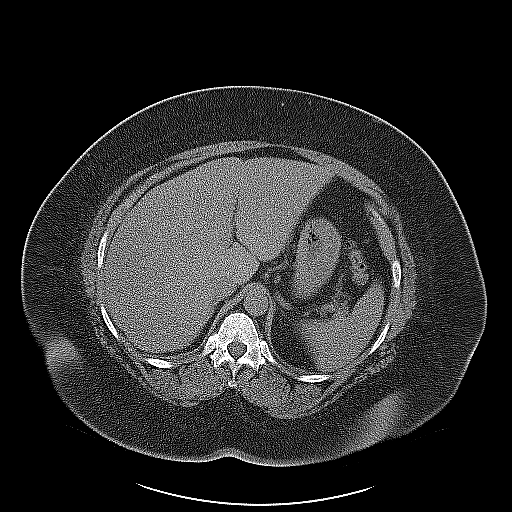
[im 9/28  soft-tissue]
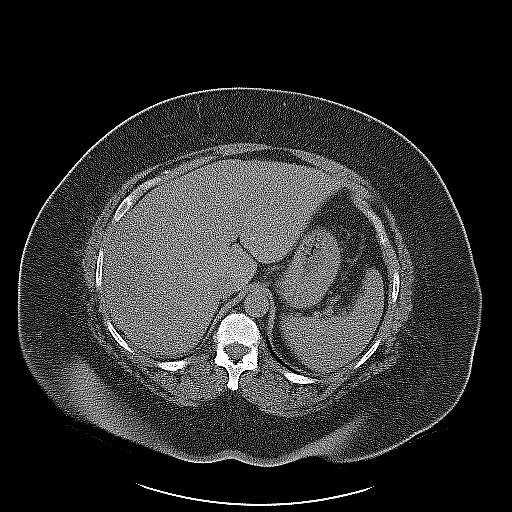
[im 11/28  soft-tissue]
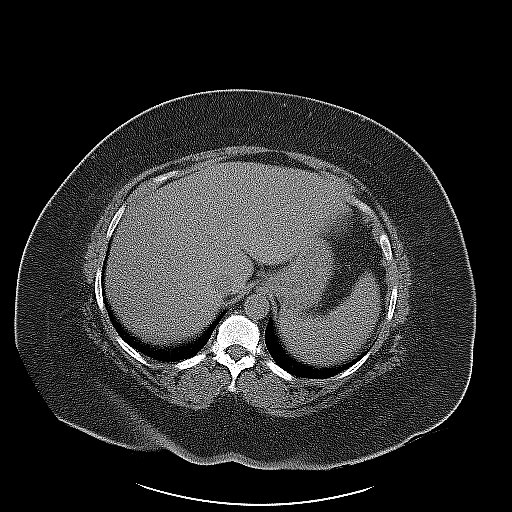
[im 13/28  soft-tissue]
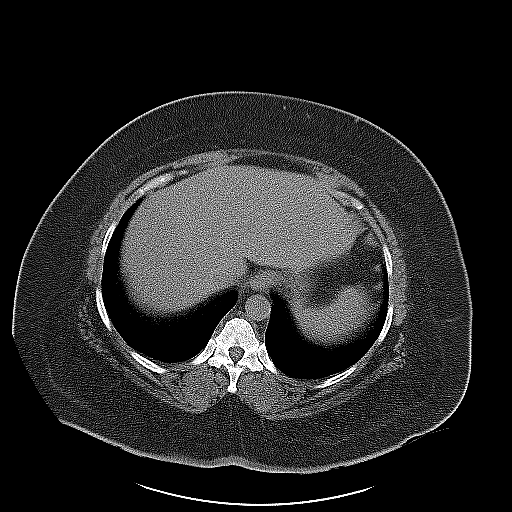
[im 15/28  soft-tissue]
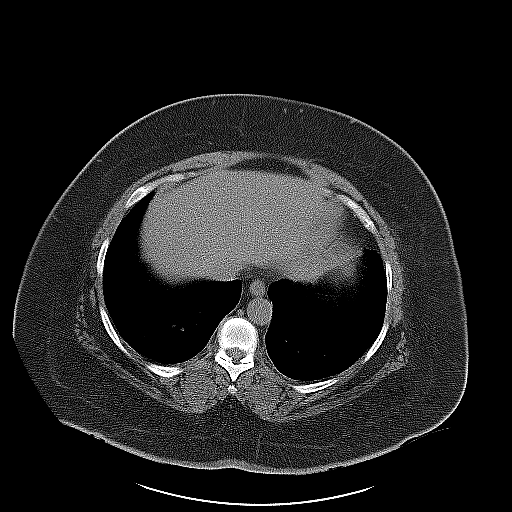
[im 17/28  soft-tissue]
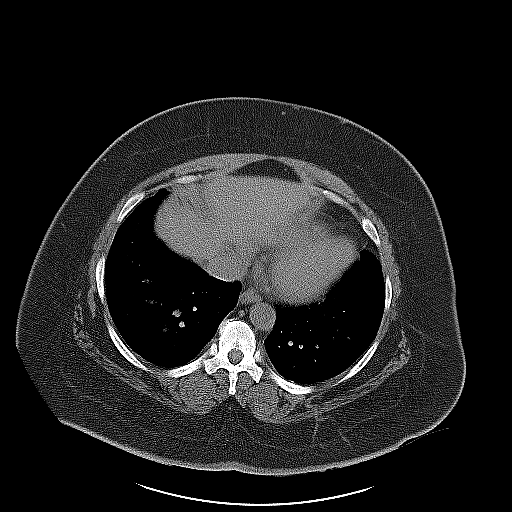
[im 19/28  soft-tissue]
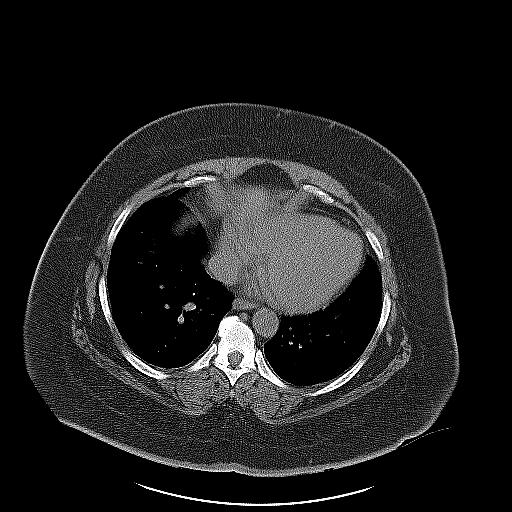
[im 19/28  bone]
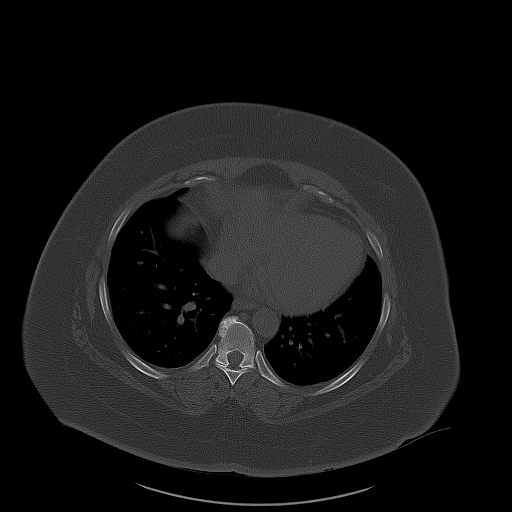
[im 21/28  soft-tissue]
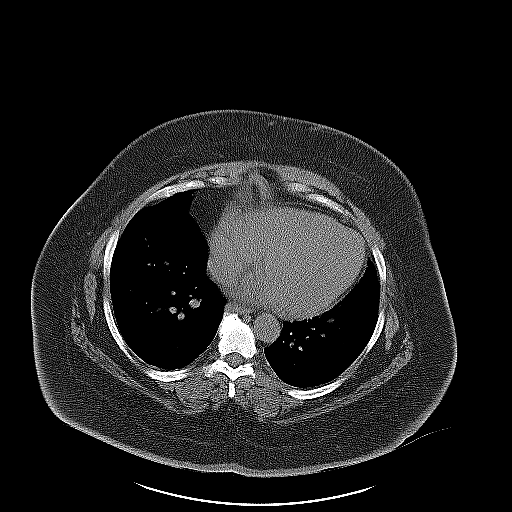
[im 23/28  soft-tissue]
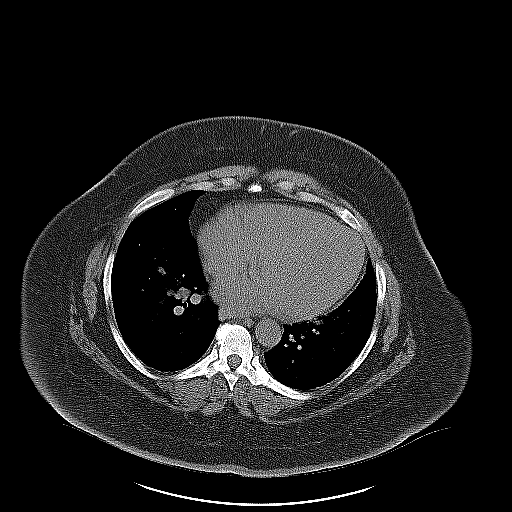
[im 24/28  lung]
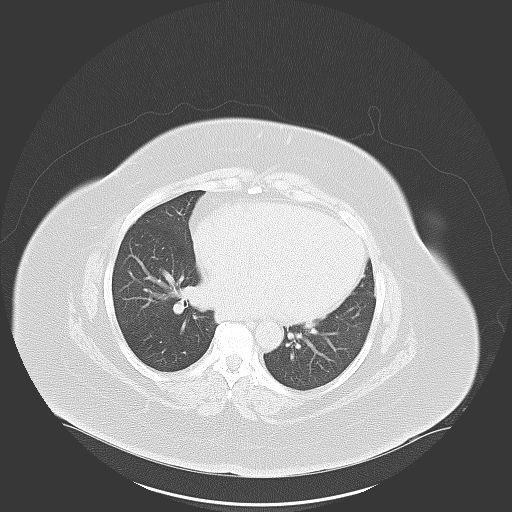
[im 25/28  soft-tissue]
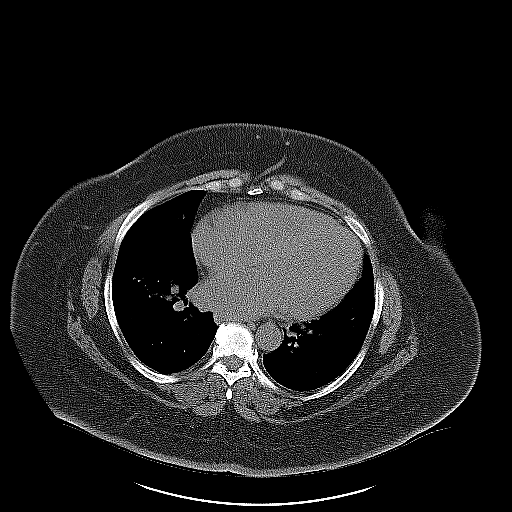
[im 25/28  lung]
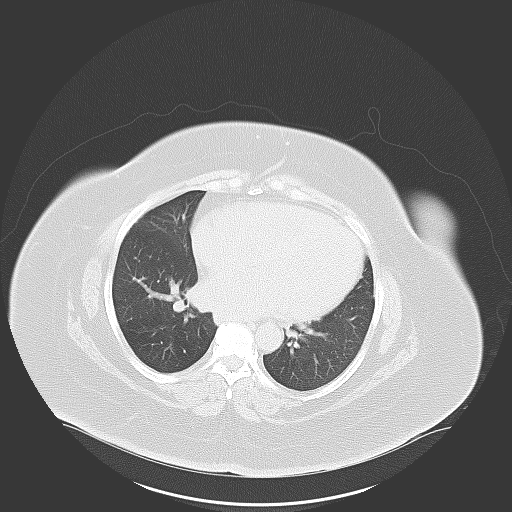
[im 26/28  lung]
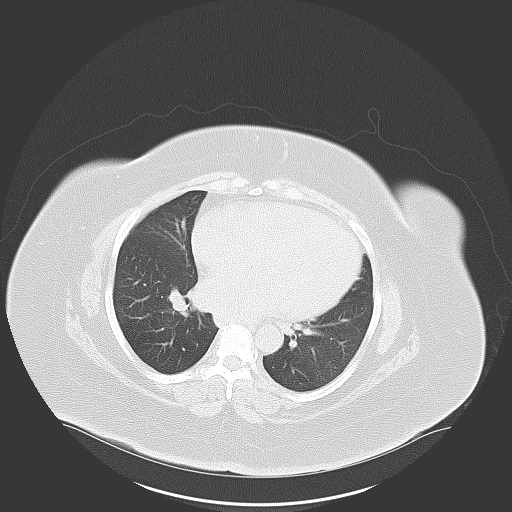
[im 27/28  soft-tissue]
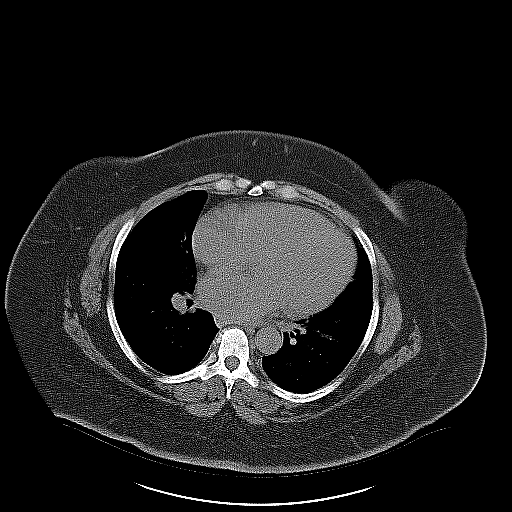
[im 27/28  lung]
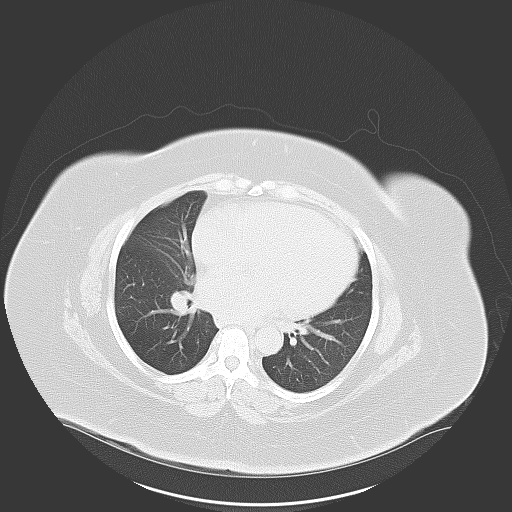

[15 of 28 positions shown; findings below may reference images not displayed]

FINDINGS: There is no evidence of renal obstruction, renal calculi, ureteral
calculi or bladder abnormality. Unenhanced appearance of the liver,
gallbladder, pancreas, spleen, adrenal glands and bowel are
unremarkable.

The uterus is enlarged with underlying fibroid disease suspected.
There is some thickening of the endometrium versus fluid in the
endometrial canal. These findings were also identified by prior
pelvic ultrasound. No free fluid is identified in the pelvis.

No enlarged lymph nodes are identified. There is no evidence of
hernia. Bony structures show mild degenerative disc disease of the
lower lumbar spine.
IMPRESSION: No calculi or renal obstruction identified. Enlargement of the
uterus with suspected uterine fibroid disease. There is associated
thickening of the endometrium versus fluid in the endometrial canal.

## 2016-03-06 ENCOUNTER — Emergency Department (HOSPITAL_COMMUNITY)
Admission: EM | Admit: 2016-03-06 | Discharge: 2016-03-06 | Disposition: A | Discharge status: Home / Self Care | Payer: No Typology Code available for payment source | Attending: Emergency Medicine | Admitting: Emergency Medicine

## 2016-03-06 ENCOUNTER — Encounter (HOSPITAL_COMMUNITY): Payer: Self-pay | Admitting: *Deleted

## 2016-03-06 DIAGNOSIS — Y92410 Unspecified street and highway as the place of occurrence of the external cause: Secondary | ICD-10-CM | POA: Diagnosis not present

## 2016-03-06 DIAGNOSIS — I10 Essential (primary) hypertension: Secondary | ICD-10-CM | POA: Insufficient documentation

## 2016-03-06 DIAGNOSIS — Z7984 Long term (current) use of oral hypoglycemic drugs: Secondary | ICD-10-CM | POA: Diagnosis not present

## 2016-03-06 DIAGNOSIS — Y9389 Activity, other specified: Secondary | ICD-10-CM | POA: Diagnosis not present

## 2016-03-06 DIAGNOSIS — Z791 Long term (current) use of non-steroidal anti-inflammatories (NSAID): Secondary | ICD-10-CM | POA: Insufficient documentation

## 2016-03-06 DIAGNOSIS — S161XXA Strain of muscle, fascia and tendon at neck level, initial encounter: Secondary | ICD-10-CM | POA: Diagnosis not present

## 2016-03-06 DIAGNOSIS — I509 Heart failure, unspecified: Secondary | ICD-10-CM | POA: Insufficient documentation

## 2016-03-06 DIAGNOSIS — Z87891 Personal history of nicotine dependence: Secondary | ICD-10-CM | POA: Diagnosis not present

## 2016-03-06 DIAGNOSIS — J449 Chronic obstructive pulmonary disease, unspecified: Secondary | ICD-10-CM | POA: Diagnosis not present

## 2016-03-06 DIAGNOSIS — Y998 Other external cause status: Secondary | ICD-10-CM | POA: Diagnosis not present

## 2016-03-06 DIAGNOSIS — S4991XA Unspecified injury of right shoulder and upper arm, initial encounter: Secondary | ICD-10-CM | POA: Diagnosis not present

## 2016-03-06 DIAGNOSIS — E663 Overweight: Secondary | ICD-10-CM | POA: Diagnosis not present

## 2016-03-06 DIAGNOSIS — Z79899 Other long term (current) drug therapy: Secondary | ICD-10-CM | POA: Diagnosis not present

## 2016-03-06 DIAGNOSIS — S199XXA Unspecified injury of neck, initial encounter: Secondary | ICD-10-CM | POA: Diagnosis present

## 2016-03-06 MED ORDER — IBUPROFEN 600 MG PO TABS: 600.0000 mg | ORAL_TABLET | Freq: Four times a day (QID) | ORAL | Status: DC | PRN

## 2016-03-06 MED ORDER — CYCLOBENZAPRINE HCL 10 MG PO TABS: 10.0000 mg | ORAL_TABLET | Freq: Two times a day (BID) | ORAL | Status: DC | PRN

## 2016-03-06 MED ORDER — KETOROLAC TROMETHAMINE 30 MG/ML IJ SOLN
30.0000 mg | Freq: Once | INTRAMUSCULAR | Status: AC
  Administered 2016-03-06: 30 mg via INTRAMUSCULAR
  Filled 2016-03-06: qty 1
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
## 2016-05-04 ENCOUNTER — Emergency Department (HOSPITAL_COMMUNITY)
Admission: EM | Admit: 2016-05-04 | Discharge: 2016-05-04 | Disposition: A | Discharge status: Home / Self Care | Payer: Medicaid Other | Attending: Emergency Medicine | Admitting: Emergency Medicine

## 2016-05-04 ENCOUNTER — Emergency Department (HOSPITAL_COMMUNITY): Payer: Medicaid Other

## 2016-05-04 ENCOUNTER — Encounter (HOSPITAL_COMMUNITY): Payer: Self-pay | Admitting: *Deleted

## 2016-05-04 DIAGNOSIS — I509 Heart failure, unspecified: Secondary | ICD-10-CM | POA: Diagnosis not present

## 2016-05-04 DIAGNOSIS — J449 Chronic obstructive pulmonary disease, unspecified: Secondary | ICD-10-CM | POA: Diagnosis not present

## 2016-05-04 DIAGNOSIS — Z7984 Long term (current) use of oral hypoglycemic drugs: Secondary | ICD-10-CM | POA: Insufficient documentation

## 2016-05-04 DIAGNOSIS — E119 Type 2 diabetes mellitus without complications: Secondary | ICD-10-CM | POA: Diagnosis not present

## 2016-05-04 DIAGNOSIS — Z791 Long term (current) use of non-steroidal anti-inflammatories (NSAID): Secondary | ICD-10-CM | POA: Diagnosis not present

## 2016-05-04 DIAGNOSIS — Z87891 Personal history of nicotine dependence: Secondary | ICD-10-CM | POA: Insufficient documentation

## 2016-05-04 DIAGNOSIS — Z7951 Long term (current) use of inhaled steroids: Secondary | ICD-10-CM | POA: Diagnosis not present

## 2016-05-04 DIAGNOSIS — R200 Anesthesia of skin: Secondary | ICD-10-CM | POA: Diagnosis present

## 2016-05-04 DIAGNOSIS — I110 Hypertensive heart disease with heart failure: Secondary | ICD-10-CM | POA: Insufficient documentation

## 2016-05-04 DIAGNOSIS — Z79899 Other long term (current) drug therapy: Secondary | ICD-10-CM | POA: Insufficient documentation

## 2016-05-04 DIAGNOSIS — J45909 Unspecified asthma, uncomplicated: Secondary | ICD-10-CM | POA: Diagnosis not present

## 2016-05-04 HISTORY — DX: Type 2 diabetes mellitus without complications: E11.9

## 2016-05-04 LAB — PROTIME-INR
INR: 0.97 (ref 0.00–1.49)
Prothrombin Time: 13.1 seconds (ref 11.6–15.2)

## 2016-05-04 LAB — CBC WITH DIFFERENTIAL/PLATELET
Basophils Absolute: 0 10*3/uL (ref 0.0–0.1)
Basophils Relative: 0 %
EOS ABS: 0.3 10*3/uL (ref 0.0–0.7)
EOS PCT: 4 %
HCT: 35.7 % — ABNORMAL LOW (ref 36.0–46.0)
Hemoglobin: 11 g/dL — ABNORMAL LOW (ref 12.0–15.0)
LYMPHS ABS: 2.9 10*3/uL (ref 0.7–4.0)
Lymphocytes Relative: 35 %
MCH: 24.7 pg — AB (ref 26.0–34.0)
MCHC: 30.8 g/dL (ref 30.0–36.0)
MCV: 80.2 fL (ref 78.0–100.0)
Monocytes Absolute: 0.5 10*3/uL (ref 0.1–1.0)
Monocytes Relative: 6 %
Neutro Abs: 4.6 10*3/uL (ref 1.7–7.7)
Neutrophils Relative %: 55 %
PLATELETS: 420 10*3/uL — AB (ref 150–400)
RBC: 4.45 MIL/uL (ref 3.87–5.11)
RDW: 16.9 % — AB (ref 11.5–15.5)
WBC: 8.4 10*3/uL (ref 4.0–10.5)

## 2016-05-04 LAB — COMPREHENSIVE METABOLIC PANEL
ALBUMIN: 3.7 g/dL (ref 3.5–5.0)
ALK PHOS: 94 U/L (ref 38–126)
ALT: 17 U/L (ref 14–54)
ANION GAP: 5 (ref 5–15)
AST: 15 U/L (ref 15–41)
BUN: 17 mg/dL (ref 6–20)
CHLORIDE: 102 mmol/L (ref 101–111)
CO2: 31 mmol/L (ref 22–32)
CREATININE: 0.73 mg/dL (ref 0.44–1.00)
Calcium: 8.8 mg/dL — ABNORMAL LOW (ref 8.9–10.3)
GFR calc non Af Amer: >60 mL/min (ref >60)
GLUCOSE: 121 mg/dL — AB (ref 65–99)
Potassium: 3.7 mmol/L (ref 3.5–5.1)
SODIUM: 138 mmol/L (ref 135–145)
TOTAL PROTEIN: 7.5 g/dL (ref 6.5–8.1)
Total Bilirubin: 0.6 mg/dL (ref 0.3–1.2)

## 2016-05-04 LAB — TROPONIN I: Troponin I: <0.03 ng/mL (ref <0.03)

## 2016-05-04 LAB — APTT: APTT: 28 s (ref 24–37)

## 2016-05-04 LAB — ETHANOL

## 2016-05-04 MED ORDER — SODIUM CHLORIDE 0.9 % IV BOLUS (SEPSIS)
500.0000 mL | Freq: Once | INTRAVENOUS | Status: AC
  Administered 2016-05-04: 500 mL via INTRAVENOUS

## 2016-05-04 MED ORDER — PREDNISONE 20 MG PO TABS: 40.0000 mg | ORAL_TABLET | Freq: Every day | ORAL | Status: DC

## 2016-05-04 MED ORDER — KETOROLAC TROMETHAMINE 30 MG/ML IJ SOLN
30.0000 mg | Freq: Once | INTRAMUSCULAR | Status: AC
  Administered 2016-05-04: 30 mg via INTRAMUSCULAR
  Filled 2016-05-04: qty 1

## 2016-05-04 NOTE — ED Notes (Signed)
PT have been made aware of urine sample 

## 2016-05-04 NOTE — Discharge Instructions (Signed)
As discussed, today's evaluation is largely reassuring.  However, it is important that you follow-up with your physician and her pulmonologist.  In addition, please be sure to begin using your nighttime oxygen machine as well.

## 2016-05-04 NOTE — ED Provider Notes (Addendum)
CSN: [id number]     Arrival date & time 05/04/16  0732 History   First MD Initiated Contact with Patient 05/04/16 0745     Chief Complaint  Patient presents with  . Numbness     (Consider location/radiation/quality/duration/timing/severity/associated sxs/prior Treatment) HPI Patient presents with concern of left arm dysesthesia. Of onset is unclear, though symptoms seem worse over the past day. No complete weakness, symptoms are essentially change in sensation. No concurrent speech difficulty, vision loss, syncope, chest pain, dyspnea. No new medication changes, diet changes. Patient acknowledges a history of multiple medical issues including COPD, CHF, sleep apnea.  Past Medical History  Diagnosis Date  . COPD (chronic obstructive pulmonary disease) ([place])   . CHF (congestive heart failure) ([place])   . SOB (shortness of breath)   . Urinary incontinence   . Asthma   . Hypertension   . Diabetes mellitus without complication [facility])    Past Surgical History  Procedure Laterality Date  . Cesarean section    . Ectopic pregnancy surgery    . Knee surgery     Family History  Problem Relation Age of Onset  . Stroke Mother   . Asthma Mother    Social History  Substance Use Topics  . Smoking status: Former Smoker -- 1.50 packs/day for 30 years    Types: Cigarettes    Quit date: 10/27/2010  . Smokeless tobacco: Never Used  . Alcohol Use: No   OB History    Gravida Para Term Preterm AB TAB SAB Ectopic Multiple Living   5 4 4  1   1  4      Review of Systems  Constitutional:       Per HPI, otherwise negative  HENT:       Per HPI, otherwise negative  Respiratory:       Per HPI, otherwise negative  Cardiovascular:       Per HPI, otherwise negative  Gastrointestinal: Negative for vomiting.  Endocrine:       Negative aside from HPI  Genitourinary:       Neg aside from HPI   Musculoskeletal:       Per HPI, otherwise negative  Skin: Negative.   Neurological: Positive for  weakness and numbness. Negative for syncope.      Allergies  Review of patient's allergies indicates no known allergies.  Home Medications   Prior to Admission medications   Medication Sig Start Date End Date Taking? Authorizing Provider  albuterol (PROVENTIL HFA;VENTOLIN HFA) 108 (90 BASE) MCG/ACT inhaler Inhale 2 puffs into the lungs every 6 (six) hours as needed for wheezing or shortness of breath. 11/05/13  Yes Orson Eva, MD  albuterol (PROVENTIL HFA;VENTOLIN HFA) 108 (90 Base) MCG/ACT inhaler Inhale into the lungs. 03/27/15  Yes Historical Provider, MD  albuterol (PROVENTIL) (2.5 MG/3ML) 0.083% nebulizer solution Take 2.5 mg by nebulization every 6 (six) hours as needed for wheezing or shortness of breath.   Yes Historical Provider, MD  amLODipine-benazepril (LOTREL) 10-20 MG capsule take 1 BY MOUTH AS NEEDED FOR FLUID 04/07/16  Yes Historical Provider, MD  glipiZIDE (GLUCOTROL) 5 MG tablet Take 5 mg by mouth daily. 04/14/16  Yes Historical Provider, MD  hydrochlorothiazide (MICROZIDE) 12.5 MG capsule Take 1 capsule by mouth daily as needed. Swelling. 02/01/15  Yes Historical Provider, MD  ibuprofen (ADVIL,MOTRIN) 600 MG tablet Take 1 tablet (600 mg total) by mouth every 6 (six) hours as needed. 03/06/16  Yes Merryl Hacker, MD  meloxicam (MOBIC) 7.5 MG tablet Take  1 tablet (7.5 mg total) by mouth daily. 10/21/14  Yes Courtney Forcucci, PA-C  meloxicam (MOBIC) 7.5 MG tablet Take 7.5 mg by mouth daily as needed for pain.  06/15/15  Yes Historical Provider, MD  phentermine (ADIPEX-P) 37.5 MG tablet Take 1/2-1 tab once a day 04/25/16  Yes Historical Provider, MD  traMADol (ULTRAM) 50 MG tablet Take 1 tablet (50 mg total) by mouth every 6 (six) hours as needed. Patient taking differently: Take 50 mg by mouth every 6 (six) hours as needed for moderate pain.  01/27/15  Yes Benjamin Cartner, PA-C  triamcinolone cream (KENALOG) 0.5 % APPLY TWICE DAILY AS NEEDED FOR SKIN DISCOMFORT 04/21/16  Yes  Historical Provider, MD  VOLTAREN 1 % GEL 2 (two) Gram on skin two times daily, as needed(100/4=25) 04/04/16  Yes Historical Provider, MD  cyclobenzaprine (FLEXERIL) 10 MG tablet Take 1 tablet (10 mg total) by mouth 2 (two) times daily as needed for muscle spasms. Patient not taking: Reported on 05/04/2016 03/06/16   Merryl Hacker, MD  metFORMIN (GLUCOPHAGE) 500 MG tablet Take 1 tablet (500 mg total) by mouth 2 (two) times daily with a meal. Patient not taking: Reported on 05/04/2016 11/07/13   Orson Eva, MD   BP 118/68 mmHg  Pulse 79  Temp(Src) 98 F (36.7 C) (Oral)  Resp 14  Ht 5\' 9"  (1.753 m)  Wt 280 lb (127.007 kg)  BMI 41.33 kg/m2  SpO2 95% Physical Exam  Constitutional: She is oriented to person, place, and time. She appears well-developed and well-nourished. No distress.  HENT:  Head: Normocephalic and atraumatic.  Eyes: Conjunctivae and EOM are normal.  Cardiovascular: Normal rate and regular rhythm.   Pulmonary/Chest: Effort normal and breath sounds normal. No stridor. No respiratory distress.  Abdominal: She exhibits no distension.  Musculoskeletal: She exhibits no edema.  Neurological: She is alert and oriented to person, place, and time. She displays no atrophy and no tremor. No cranial nerve deficit or sensory deficit. She exhibits normal muscle tone. She displays no seizure activity. Coordination normal.  Skin: Skin is warm and dry.  Psychiatric: She has a normal mood and affect.  Nursing note and vitals reviewed.   ED Course  Procedures (including critical care time) Labs Review Labs Reviewed  COMPREHENSIVE METABOLIC PANEL - Abnormal; Notable for the following:    Glucose, Bld 121 (*)    Calcium 8.8 (*)    All other components within normal limits  CBC WITH DIFFERENTIAL/PLATELET - Abnormal; Notable for the following:    Hemoglobin 11.0 (*)    HCT 35.7 (*)    MCH 24.7 (*)    RDW 16.9 (*)    Platelets 420 (*)    All other components within normal limits  APTT   ETHANOL  TROPONIN I  PROTIME-INR  URINE RAPID DRUG SCREEN, HOSP PERFORMED  URINALYSIS, ROUTINE W REFLEX MICROSCOPIC (NOT AT [facility])    Imaging Review Ct Head Wo Contrast  05/04/2016  CLINICAL DATA:  55 year old female who awoke this morning with left arm and hand numbness. Possible stroke. Initial encounter. EXAM: CT HEAD WITHOUT CONTRAST TECHNIQUE: Contiguous axial images were obtained from the base of the skull through the vertex without intravenous contrast. COMPARISON:  None. FINDINGS: Chronic right lamina papyracea fracture. Minimal right ethmoid sinus mucosal thickening. Opacified and sclerotic left mastoid air cells. The left tympanic cavity appears clear. No acute osseous abnormality identified. Negative orbit and scalp soft tissues. Cerebral volume is normal. No midline shift, ventriculomegaly, mass effect, evidence of mass  lesion, intracranial hemorrhage or evidence of cortically based acute infarction. Artifactual hypodensity in the lower pons due to skullbase streak (series 2, image 7). Gray-white matter differentiation is within normal limits throughout the brain. No suspicious intracranial vascular hyperdensity. IMPRESSION: 1.  Normal noncontrast CT appearance of the brain. 2. Left mastoid opacification and sclerosis compatible with sequelae of chronic inflammation. Electronically Signed   By: Genevie Ann M.D.   On: 05/04/2016 09:15   I have personally reviewed and evaluated these images and lab results as part of my medical decision-making.   EKG Interpretation   Date/Time:  Sunday May 04 2016 08:37:00 EDT Ventricular Rate:  91 PR Interval:    QRS Duration: 114 QT Interval:  391 QTC Calculation: 482 R Axis:   76 Text Interpretation:  Sinus rhythm Artifact T wave abnormality Abnormal  ekg Confirmed by Jaegar Croft  MD ([id number]) on 05/04/2016 8:59:08 AM     11 :25 AM Patient in no distress. Patient also acknowledges a history of sleep apnea, for which she has been taking no  nocturnal oxygen supplementation recently. A lengthy conversation about all findings, reassuring results, need to follow-up with pulmonology, for follow-up, as well as to re-initiated sleep apnea therapy.  MDM  Patient presents with multiple complaints, essentially seemingly most concerned with left arm dysesthesia. Here the patient is awake and alert, neurologically intact, hemodynamically stable. Patient's evaluation was largely reassuring, given the patient's initial endorsement of sleep apnea, with no recent appropriate oxygen. With no alarming findings, the patient was encouraged to use oxygen at night, follow up with pulmonology and primary care, discharged in stable condition.  Carmin Muskrat, MD 05/04/16 [place], MD 05/29/16 [phone]

## 2016-05-04 NOTE — ED Notes (Signed)
Per pt report: pt reports waking this morning and had left arm and hand numbness.  Pt reports she been "feeling like crap" for the past few days.  Pt also reports having arm numbness on and off x 1 month.  Hx DM and HTN.

## 2016-07-09 ENCOUNTER — Emergency Department (HOSPITAL_COMMUNITY): Payer: Medicaid Other

## 2016-07-09 ENCOUNTER — Encounter (HOSPITAL_COMMUNITY): Payer: Self-pay

## 2016-07-09 ENCOUNTER — Emergency Department (HOSPITAL_COMMUNITY)
Admission: EM | Admit: 2016-07-09 | Discharge: 2016-07-10 | Disposition: A | Discharge status: Home / Self Care | Payer: Medicaid Other | Attending: Emergency Medicine | Admitting: Emergency Medicine

## 2016-07-09 DIAGNOSIS — Z79899 Other long term (current) drug therapy: Secondary | ICD-10-CM | POA: Diagnosis not present

## 2016-07-09 DIAGNOSIS — I509 Heart failure, unspecified: Secondary | ICD-10-CM | POA: Insufficient documentation

## 2016-07-09 DIAGNOSIS — Z87891 Personal history of nicotine dependence: Secondary | ICD-10-CM | POA: Diagnosis not present

## 2016-07-09 DIAGNOSIS — Z7984 Long term (current) use of oral hypoglycemic drugs: Secondary | ICD-10-CM | POA: Insufficient documentation

## 2016-07-09 DIAGNOSIS — R0602 Shortness of breath: Secondary | ICD-10-CM | POA: Diagnosis present

## 2016-07-09 DIAGNOSIS — I110 Hypertensive heart disease with heart failure: Secondary | ICD-10-CM | POA: Insufficient documentation

## 2016-07-09 DIAGNOSIS — J45909 Unspecified asthma, uncomplicated: Secondary | ICD-10-CM | POA: Insufficient documentation

## 2016-07-09 DIAGNOSIS — Z7951 Long term (current) use of inhaled steroids: Secondary | ICD-10-CM | POA: Diagnosis not present

## 2016-07-09 DIAGNOSIS — E119 Type 2 diabetes mellitus without complications: Secondary | ICD-10-CM | POA: Diagnosis not present

## 2016-07-09 DIAGNOSIS — J449 Chronic obstructive pulmonary disease, unspecified: Secondary | ICD-10-CM | POA: Diagnosis not present

## 2016-07-09 DIAGNOSIS — I10 Essential (primary) hypertension: Secondary | ICD-10-CM

## 2016-07-09 LAB — BASIC METABOLIC PANEL
Anion gap: 7 (ref 5–15)
BUN: 15 mg/dL (ref 6–20)
CALCIUM: 9.2 mg/dL (ref 8.9–10.3)
CO2: 30 mmol/L (ref 22–32)
CREATININE: 0.85 mg/dL (ref 0.44–1.00)
Chloride: 105 mmol/L (ref 101–111)
GFR calc Af Amer: >60 mL/min (ref >60)
Glucose, Bld: 115 mg/dL — ABNORMAL HIGH (ref 65–99)
Potassium: 3.8 mmol/L (ref 3.5–5.1)
Sodium: 142 mmol/L (ref 135–145)

## 2016-07-09 LAB — CBC WITH DIFFERENTIAL/PLATELET
BASOS PCT: 0 %
Basophils Absolute: 0 10*3/uL (ref 0.0–0.1)
Eosinophils Absolute: 0.3 10*3/uL (ref 0.0–0.7)
Eosinophils Relative: 3 %
HEMATOCRIT: 35.3 % — AB (ref 36.0–46.0)
HEMOGLOBIN: 11.3 g/dL — AB (ref 12.0–15.0)
LYMPHS ABS: 3.5 10*3/uL (ref 0.7–4.0)
Lymphocytes Relative: 35 %
MCH: 25.7 pg — AB (ref 26.0–34.0)
MCHC: 32 g/dL (ref 30.0–36.0)
MCV: 80.2 fL (ref 78.0–100.0)
MONO ABS: 0.6 10*3/uL (ref 0.1–1.0)
MONOS PCT: 6 %
NEUTROS ABS: 5.7 10*3/uL (ref 1.7–7.7)
NEUTROS PCT: 56 %
Platelets: 420 10*3/uL — ABNORMAL HIGH (ref 150–400)
RBC: 4.4 MIL/uL (ref 3.87–5.11)
RDW: 16.2 % — AB (ref 11.5–15.5)
WBC: 10 10*3/uL (ref 4.0–10.5)

## 2016-07-09 LAB — BRAIN NATRIURETIC PEPTIDE: B NATRIURETIC PEPTIDE 5: 37.4 pg/mL (ref 0.0–100.0)

## 2016-07-09 LAB — I-STAT TROPONIN, ED: Troponin i, poc: 0 ng/mL (ref 0.00–0.08)

## 2016-07-09 MED ORDER — LABETALOL HCL 5 MG/ML IV SOLN
20.0000 mg | Freq: Once | INTRAVENOUS | Status: AC
  Administered 2016-07-09: 20 mg via INTRAVENOUS
  Filled 2016-07-09: qty 4

## 2016-07-09 MED ORDER — ENALAPRILAT 1.25 MG/ML IV SOLN
1.2500 mg | Freq: Once | INTRAVENOUS | Status: AC
  Administered 2016-07-10: 1.25 mg via INTRAVENOUS
  Filled 2016-07-09: qty 1

## 2016-07-09 MED ORDER — FUROSEMIDE 10 MG/ML IJ SOLN
20.0000 mg | Freq: Once | INTRAMUSCULAR | Status: AC
  Administered 2016-07-09: 20 mg via INTRAVENOUS
  Filled 2016-07-09: qty 4

## 2016-07-09 NOTE — ED Notes (Signed)
Pt keeps asking for a referral and medication change, she didn't want to come to the back and actually be seen.

## 2016-07-09 NOTE — ED Triage Notes (Signed)
Pt complains of hypertension and short of breath, she states that her primary recently increased the dose of her BP medication and added lasix, she states her BP is elevated and she feels swollen.

## 2016-07-10 MED ORDER — FUROSEMIDE 40 MG PO TABS: 40.0000 mg | ORAL_TABLET | Freq: Every day | ORAL | 0 refills | Status: DC

## 2016-07-10 MED ORDER — LISINOPRIL 20 MG PO TABS: 20.0000 mg | ORAL_TABLET | Freq: Every day | ORAL | 0 refills | Status: DC

## 2016-07-10 MED ORDER — POTASSIUM CHLORIDE CRYS ER 20 MEQ PO TBCR: 20.0000 meq | EXTENDED_RELEASE_TABLET | Freq: Two times a day (BID) | ORAL | 0 refills | Status: DC

## 2016-07-10 NOTE — Discharge Instructions (Signed)
Take all medicines as instructed.  Call [facility] cardiology office at number above for outpatient appointment

## 2016-07-10 NOTE — ED Provider Notes (Signed)
[facility] DEPT Provider Note   CSN: [id number] Arrival date & time: 07/09/16  2033     History   Chief Complaint Chief Complaint  Patient presents with  . Shortness of Breath    HPI Kathryn Mahoney is a 55 y.o. female. Patient presents for evaluation of multiple complaints. She has a rather circuitous conversation involving herself and me back and forth about multiple reasons she does not like to take the medications her primary care physician prescribes. States she was on Norvasc. She states "then she changed to amlodipine". She did not like either one of these. Cerebellar primary care doctor again. Was placed on metoprolol. For reasons that I cannot understand did not like to or want to take the metoprolol. She quit taking her Lasix. Now is concerned she has swelling in her feet.  She denies pain in her chest. She denies fever or cough. No classic PND or orthopnea. States her blood pressures height and makes her head hurt and her feet are swollen  HPI  Past Medical History:  Diagnosis Date  . Asthma   . CHF (congestive heart failure) ([place])   . COPD (chronic obstructive pulmonary disease) ([place])   . Diabetes mellitus without complication ([place])   . Hypertension   . SOB (shortness of breath)   . Urinary incontinence     Patient Active Problem List   Diagnosis Date Noted  . Type II or unspecified type diabetes mellitus without mention of complication, not stated as uncontrolled 11/07/2013  . Hyperglycemia 11/06/2013  . COPD exacerbation ([place]) 11/02/2013  . PNA (pneumonia) 11/02/2013  . Hypoxia 11/02/2013  . Acute-on-chronic respiratory failure ([place]) 11/02/2013  . COPD (chronic obstructive pulmonary disease) ([place])   . CHF (congestive heart failure) ([place])   . Urinary incontinence   . Hypertension     Past Surgical History:  Procedure Laterality Date  . CESAREAN SECTION    . ECTOPIC PREGNANCY SURGERY    . KNEE SURGERY      OB History    Gravida Para Term Preterm  AB Living   5 4 4   1 4    SAB TAB Ectopic Multiple Live Births       1           Home Medications    Prior to Admission medications   Medication Sig Start Date End Date Taking? Authorizing Provider  albuterol (PROVENTIL HFA;VENTOLIN HFA) 108 (90 BASE) MCG/ACT inhaler Inhale 2 puffs into the lungs every 6 (six) hours as needed for wheezing or shortness of breath. 11/05/13  Yes Orson Eva, MD  meloxicam (MOBIC) 15 MG tablet Take 15 mg by mouth daily as needed for pain.  05/30/16  Yes Historical Provider, MD  metoprolol (LOPRESSOR) 50 MG tablet Take 50 mg by mouth 2 (two) times daily. 07/04/16  Yes Historical Provider, MD  phentermine (ADIPEX-P) 37.5 MG tablet Take 37.5 mg by mouth every morning 04/25/16  Yes Historical Provider, MD  triamcinolone cream (KENALOG) 0.5 % APPLY TWICE DAILY AS NEEDED FOR SKIN DISCOMFORT 04/21/16  Yes Historical Provider, MD  VOLTAREN 1 % GEL 2 (two) Gram on skin two times daily, as needed for pain 04/04/16  Yes Historical Provider, MD  cyclobenzaprine (FLEXERIL) 10 MG tablet Take 1 tablet (10 mg total) by mouth 2 (two) times daily as needed for muscle spasms. Patient not taking: Reported on 07/09/2016 03/06/16   Merryl Hacker, MD  furosemide (LASIX) 40 MG tablet Take 1 tablet (40 mg total) by mouth daily. 07/10/16  Tanna Furry, MD  ibuprofen (ADVIL,MOTRIN) 600 MG tablet Take 1 tablet (600 mg total) by mouth every 6 (six) hours as needed. Patient not taking: Reported on 07/09/2016 03/06/16   Merryl Hacker, MD  lisinopril (PRINIVIL,ZESTRIL) 20 MG tablet Take 1 tablet (20 mg total) by mouth daily. 07/09/16   Tanna Furry, MD  metFORMIN (GLUCOPHAGE) 500 MG tablet Take 1 tablet (500 mg total) by mouth 2 (two) times daily with a meal. Patient not taking: Reported on 07/09/2016 11/07/13   Orson Eva, MD  potassium chloride SA (K-DUR,KLOR-CON) 20 MEQ tablet Take 1 tablet (20 mEq total) by mouth 2 (two) times daily. 07/10/16   Tanna Furry, MD  predniSONE (DELTASONE) 20 MG tablet Take 2  tablets (40 mg total) by mouth daily with breakfast. For the next four days Patient not taking: Reported on 07/09/2016 05/04/16   Carmin Muskrat, MD  traMADol (ULTRAM) 50 MG tablet Take 1 tablet (50 mg total) by mouth every 6 (six) hours as needed. Patient not taking: Reported on 07/09/2016 01/27/15   Comer Locket, PA-C    Family History Family History  Problem Relation Age of Onset  . Stroke Mother   . Asthma Mother     Social History Social History  Substance Use Topics  . Smoking status: Former Smoker    Packs/day: 1.50    Years: 30.00    Types: Cigarettes    Quit date: 10/27/2010  . Smokeless tobacco: Never Used  . Alcohol use No     Allergies   Review of patient's allergies indicates no known allergies.   Review of Systems Review of Systems  Constitutional: Negative for appetite change, chills, diaphoresis, fatigue and fever.  HENT: Negative for mouth sores, sore throat and trouble swallowing.   Eyes: Negative for visual disturbance.  Respiratory: Positive for shortness of breath. Negative for cough, chest tightness and wheezing.   Cardiovascular: Positive for leg swelling. Negative for chest pain.  Gastrointestinal: Negative for abdominal distention, abdominal pain, diarrhea, nausea and vomiting.  Endocrine: Negative for polydipsia, polyphagia and polyuria.  Genitourinary: Negative for dysuria, frequency and hematuria.  Musculoskeletal: Negative for gait problem.  Skin: Negative for color change, pallor and rash.  Neurological: Positive for headaches. Negative for dizziness, syncope and light-headedness.  Hematological: Does not bruise/bleed easily.  Psychiatric/Behavioral: Negative for behavioral problems and confusion.     Physical Exam Updated Vital Signs BP (!) 170/118 (BP Location: Left Arm)   Pulse 99   Temp 98.2 F (36.8 C) (Oral)   Resp 15   SpO2 97%   Physical Exam  Constitutional: She is oriented to person, place, and time. She appears  well-developed and well-nourished. No distress.  HENT:  Head: Normocephalic.  Eyes: Conjunctivae are normal. Pupils are equal, round, and reactive to light. No scleral icterus.  Neck: Normal range of motion. Neck supple. No thyromegaly present.  Cardiovascular: Normal rate and regular rhythm.  Exam reveals no gallop and no friction rub.   No murmur heard. Morbidly obese black female. Distant breath sounds no crackles or rales. No ST or for gallop. No JVD. Is 1+ symmetric lower extremity edema.  Pulmonary/Chest: Effort normal and breath sounds normal. No respiratory distress. She has no wheezes. She has no rales.  Abdominal: Soft. Bowel sounds are normal. She exhibits no distension. There is no tenderness. There is no rebound.  Musculoskeletal: Normal range of motion.  Neurological: She is alert and oriented to person, place, and time.  Skin: Skin is warm and dry. No rash  noted.  Psychiatric: She has a normal mood and affect. Her behavior is normal.     ED Treatments / Results  Labs (all labs ordered are listed, but only abnormal results are displayed) Labs Reviewed  CBC WITH DIFFERENTIAL/PLATELET - Abnormal; Notable for the following:       Result Value   Hemoglobin 11.3 (*)    HCT 35.3 (*)    MCH 25.7 (*)    RDW 16.2 (*)    Platelets 420 (*)    All other components within normal limits  BASIC METABOLIC PANEL - Abnormal; Notable for the following:    Glucose, Bld 115 (*)    All other components within normal limits  BRAIN NATRIURETIC PEPTIDE  I-STAT TROPOININ, ED    EKG  EKG Interpretation None       Radiology Dg Chest 2 View  Result Date: 07/09/2016 CLINICAL DATA:  Shortness of breath. EXAM: CHEST  2 VIEW COMPARISON:  11/02/2013 FINDINGS: The cardiomediastinal contours are unchanged with chronic cardiomegaly. Vascular congestion with mild improvement from prior. No consolidation, pleural effusion, or pneumothorax. No acute osseous abnormalities are seen. IMPRESSION:  Chronic cardiomegaly. Vascular congestion, also likely chronic and diminished from exam 2 years prior. Electronically Signed   By: Jeb Levering M.D.   On: 07/09/2016 22:53    Procedures Procedures (including critical care time)  Medications Ordered in ED Medications  enalaprilat (VASOTEC) injection 1.25 mg (not administered)  labetalol (NORMODYNE,TRANDATE) injection 20 mg (20 mg Intravenous Given 07/09/16 2256)  furosemide (LASIX) injection 20 mg (20 mg Intravenous Given 07/09/16 2256)     Initial Impression / Assessment and Plan / ED Course  I have reviewed the triage vital signs and the nursing notes.  Pertinent labs & imaging results that were available during my care of the patient were reviewed by me and considered in my medical decision making (see chart for details).  Clinical Course    Pressure improved with labetalol. Given IV Lasix. Has diuresed. Chest x-ray shows following gastric congestion without pulmonary edema. Intact renal function. EKG without acute changes and normal troponin. Plan is home, lisinopril, Lasix, potassium, cardiology referral.  Final Clinical Impressions(s) / ED Diagnoses   Final diagnoses:  Congestive heart failure, unspecified congestive heart failure chronicity, unspecified congestive heart failure type (HCC)  Essential hypertension    New Prescriptions New Prescriptions   FUROSEMIDE (LASIX) 40 MG TABLET    Take 1 tablet (40 mg total) by mouth daily.   LISINOPRIL (PRINIVIL,ZESTRIL) 20 MG TABLET    Take 1 tablet (20 mg total) by mouth daily.   POTASSIUM CHLORIDE SA (K-DUR,KLOR-CON) 20 MEQ TABLET    Take 1 tablet (20 mEq total) by mouth 2 (two) times daily.     Tanna Furry, MD 07/10/16 0005

## 2016-07-11 ENCOUNTER — Emergency Department (HOSPITAL_COMMUNITY)
Admission: EM | Admit: 2016-07-11 | Discharge: 2016-07-11 | Disposition: A | Discharge status: Home / Self Care | Payer: Medicaid Other | Attending: Emergency Medicine | Admitting: Emergency Medicine

## 2016-07-11 ENCOUNTER — Encounter (HOSPITAL_COMMUNITY): Payer: Self-pay | Admitting: Emergency Medicine

## 2016-07-11 DIAGNOSIS — J45909 Unspecified asthma, uncomplicated: Secondary | ICD-10-CM | POA: Insufficient documentation

## 2016-07-11 DIAGNOSIS — Z87891 Personal history of nicotine dependence: Secondary | ICD-10-CM | POA: Insufficient documentation

## 2016-07-11 DIAGNOSIS — I10 Essential (primary) hypertension: Secondary | ICD-10-CM

## 2016-07-11 DIAGNOSIS — M545 Low back pain, unspecified: Secondary | ICD-10-CM

## 2016-07-11 DIAGNOSIS — G8929 Other chronic pain: Secondary | ICD-10-CM

## 2016-07-11 DIAGNOSIS — I509 Heart failure, unspecified: Secondary | ICD-10-CM | POA: Insufficient documentation

## 2016-07-11 DIAGNOSIS — E119 Type 2 diabetes mellitus without complications: Secondary | ICD-10-CM | POA: Diagnosis not present

## 2016-07-11 DIAGNOSIS — I110 Hypertensive heart disease with heart failure: Secondary | ICD-10-CM | POA: Diagnosis not present

## 2016-07-11 DIAGNOSIS — Z79899 Other long term (current) drug therapy: Secondary | ICD-10-CM | POA: Diagnosis not present

## 2016-07-11 MED ORDER — MELOXICAM 15 MG PO TABS: 15.0000 mg | ORAL_TABLET | Freq: Every day | ORAL | 0 refills | Status: DC | PRN

## 2016-07-11 MED ORDER — DEXAMETHASONE SODIUM PHOSPHATE 10 MG/ML IJ SOLN
10.0000 mg | Freq: Once | INTRAMUSCULAR | Status: AC
  Administered 2016-07-11: 10 mg via INTRAMUSCULAR
  Filled 2016-07-11: qty 1

## 2016-07-11 MED ORDER — KETOROLAC TROMETHAMINE 30 MG/ML IJ SOLN
60.0000 mg | Freq: Once | INTRAMUSCULAR | Status: AC
  Administered 2016-07-11: 60 mg via INTRAMUSCULAR
  Filled 2016-07-11: qty 2

## 2016-07-11 MED ORDER — TRAMADOL HCL 50 MG PO TABS: 50.0000 mg | ORAL_TABLET | Freq: Four times a day (QID) | ORAL | 0 refills | Status: DC | PRN

## 2016-07-11 NOTE — ED Provider Notes (Signed)
[facility] DEPT Provider Note   CSN: [id number] Arrival date & time: 07/11/16  1926   By signing my name below, I, Royce Macadamia, attest that this documentation has been prepared under the direction and in the presence of  [employer], [place]. Electronically Signed: Royce Macadamia, ED Scribe. 07/11/16. 9:38 PM.  History   Chief Complaint Chief Complaint  Patient presents with  . Back Pain   The history is provided by the patient and medical records. No language interpreter was used.    HPI Comments:  Kathryn Mahoney is a 55 y.o. female who presents to the Emergency Department complaining of acute on chronic lower back pain beginning a couple of months ago, but worsening today.  Her pain is worse with walking. She has a history of herniated disc. She has taken 4-5 81 mg Asprin tablets without relief.  She saw her PCP for this pain and was prescribed meloxicam. She would like a "shot" for her pain. She denies recent injury or overexertion, fever and bowel or bladder incontinence.    Past Medical History:  Diagnosis Date  . Asthma   . CHF (congestive heart failure) ([place])   . COPD (chronic obstructive pulmonary disease) ([place])   . Diabetes mellitus without complication ([place])   . Hypertension   . SOB (shortness of breath)   . Urinary incontinence     Patient Active Problem List   Diagnosis Date Noted  . Type II or unspecified type diabetes mellitus without mention of complication, not stated as uncontrolled 11/07/2013  . Hyperglycemia 11/06/2013  . COPD exacerbation ([place]) 11/02/2013  . PNA (pneumonia) 11/02/2013  . Hypoxia 11/02/2013  . Acute-on-chronic respiratory failure ([place]) 11/02/2013  . COPD (chronic obstructive pulmonary disease) ([place])   . CHF (congestive heart failure) ([place])   . Urinary incontinence   . Hypertension     Past Surgical History:  Procedure Laterality Date  . CESAREAN SECTION    . ECTOPIC PREGNANCY SURGERY    . KNEE SURGERY      OB  History    Gravida Para Term Preterm AB Living   5 4 4   1 4    SAB TAB Ectopic Multiple Live Births       1           Home Medications    Prior to Admission medications   Medication Sig Start Date End Date Taking? Authorizing Provider  albuterol (PROVENTIL HFA;VENTOLIN HFA) 108 (90 BASE) MCG/ACT inhaler Inhale 2 puffs into the lungs every 6 (six) hours as needed for wheezing or shortness of breath. 11/05/13  Yes Orson Eva, MD  furosemide (LASIX) 40 MG tablet Take 1 tablet (40 mg total) by mouth daily. 07/10/16  Yes Tanna Furry, MD  lisinopril (PRINIVIL,ZESTRIL) 20 MG tablet Take 1 tablet (20 mg total) by mouth daily. 07/09/16  Yes Tanna Furry, MD  phentermine (ADIPEX-P) 37.5 MG tablet Take 37.5 mg by mouth every morning 04/25/16  Yes Historical Provider, MD  potassium chloride SA (K-DUR,KLOR-CON) 20 MEQ tablet Take 1 tablet (20 mEq total) by mouth 2 (two) times daily. 07/10/16  Yes Tanna Furry, MD  VOLTAREN 1 % GEL 2 (two) Gram on skin two times daily, as needed for pain 04/04/16  Yes Historical Provider, MD  cyclobenzaprine (FLEXERIL) 10 MG tablet Take 1 tablet (10 mg total) by mouth 2 (two) times daily as needed for muscle spasms. Patient not taking: Reported on 07/11/2016 03/06/16   Merryl Hacker, MD  ibuprofen (ADVIL,MOTRIN) 600 MG tablet Take 1  tablet (600 mg total) by mouth every 6 (six) hours as needed. Patient not taking: Reported on 07/11/2016 03/06/16   Merryl Hacker, MD  meloxicam (MOBIC) 15 MG tablet Take 1 tablet (15 mg total) by mouth daily as needed for pain. 07/11/16   Antonietta Breach, PA-C  metFORMIN (GLUCOPHAGE) 500 MG tablet Take 1 tablet (500 mg total) by mouth 2 (two) times daily with a meal. Patient not taking: Reported on 07/11/2016 11/07/13   Orson Eva, MD  predniSONE (DELTASONE) 20 MG tablet Take 2 tablets (40 mg total) by mouth daily with breakfast. For the next four days Patient not taking: Reported on 07/11/2016 05/04/16   Carmin Muskrat, MD  traMADol (ULTRAM) 50 MG tablet  Take 1 tablet (50 mg total) by mouth every 6 (six) hours as needed for severe pain. 07/11/16   Antonietta Breach, PA-C  triamcinolone cream (KENALOG) 0.5 % APPLY TWICE DAILY AS NEEDED FOR SKIN DISCOMFORT 04/21/16   Historical Provider, MD    Family History Family History  Problem Relation Age of Onset  . Stroke Mother   . Asthma Mother     Social History Social History  Substance Use Topics  . Smoking status: Former Smoker    Packs/day: 1.50    Years: 30.00    Types: Cigarettes    Quit date: 10/27/2010  . Smokeless tobacco: Never Used  . Alcohol use No     Allergies   Review of patient's allergies indicates no known allergies.   Review of Systems Review of Systems  Musculoskeletal: Positive for back pain.  10 systems reviewed and all are negative for acute change except as noted in the HPI.   Physical Exam Updated Vital Signs BP 163/94   Pulse 110   Temp 98.7 F (37.1 C) (Oral)   Resp 20   Ht 5\' 9"  (1.753 m)   Wt 127 kg   SpO2 93%   BMI 41.35 kg/m   Physical Exam  Constitutional: She is oriented to person, place, and time. She appears well-developed and well-nourished. No distress.  Nontoxic and in no distress  HENT:  Head: Normocephalic and atraumatic.  Eyes: Conjunctivae and EOM are normal. No scleral icterus.  Neck: Normal range of motion.  Cardiovascular: Normal rate, regular rhythm and intact distal pulses.   DP pulses 2+ b/l.  Pulmonary/Chest: Effort normal. No respiratory distress.  Respirations even and unlabored  Musculoskeletal: Normal range of motion.  TTP to the left lumbar paraspinal muscles. No TTP to the lumbosacral midline. No bony deformities, step-offs, or crepitus.  Neurological: She is alert and oriented to person, place, and time.  Sensation to light touch intact in all extremities. Patient ambulatory with steady gait.  Skin: Skin is warm and dry. No rash noted. She is not diaphoretic. No erythema. No pallor.  Psychiatric: She has a normal  mood and affect. Her behavior is normal.  Nursing note and vitals reviewed.    ED Treatments / Results   DIAGNOSTIC STUDIES:  Oxygen Saturation is 96% on RA, NML by my interpretation.    COORDINATION OF CARE:  9:38 PM Discussed treatment plan with pt at bedside and pt agreed to plan.  Labs (all labs ordered are listed, but only abnormal results are displayed) Labs Reviewed - No data to display  EKG  EKG Interpretation None       Radiology No results found.  Procedures Procedures (including critical care time)  Medications Ordered in ED Medications  ketorolac (TORADOL) 30 MG/ML injection 60 mg (60  mg Intramuscular Given 07/11/16 2157)  dexamethasone (DECADRON) injection 10 mg (10 mg Intramuscular Given 07/11/16 2157)     Initial Impression / Assessment and Plan / ED Course  I have reviewed the triage vital signs and the nursing notes.  Pertinent labs & imaging results that were available during my care of the patient were reviewed by me and considered in my medical decision making (see chart for details).  Clinical Course    Patient presenting with acute on chronic back pain. She is neurovascularly intact and denies recent trauma or injury. No loss of bowel or bladder control. No concern for cauda equina. No fever, hx of CA, or hx of IVDU. RICE protocol and pain medicine indicated and discussed with patient. Return precautions discussed and provided. Patient discharged in stable condition with no unaddressed concerns.   Final Clinical Impressions(s) / ED Diagnoses   Final diagnoses:  Hypertension not at goal  Acute exacerbation of chronic low back pain    New Prescriptions Discharge Medication List as of 07/11/2016 10:34 PM      I personally performed the services described in this documentation, which was scribed in my presence. The recorded information has been reviewed and is accurate.      Antonietta Breach, PA-C 07/12/16 [id number]    Tanna Furry,  MD 07/15/16 [phone]

## 2016-07-11 NOTE — Progress Notes (Signed)
[facility] consulted for pcp issues.  Patient listed as having Medicaid [employer] insurance.  Pcp listed on her insurance card is located at [place].  [phone].  EDCM spoke to patient at bedside.  Patient is aware that this is her pcp.  She is aware to change the provider on her card she must contact the DSS.  She was made aware that if she needs to see a cardiologist, she must get a referral from her pcp.  EDPA at bedside.  No further EDCM needs at this time.  System updated

## 2016-07-11 NOTE — ED Notes (Signed)
Care Management at bedside

## 2016-07-11 NOTE — ED Triage Notes (Signed)
Pt presents with low back pain, chronic, worsening today to where she "can't take it anymore".  Relieved with sitting/resting.  Worse with standing/walking.  Numbness and tingling down her lower extremities.  States she just started taking lisinopril today.  Was treated here recently for hypertension and shortness of breath and states that is when she was diagnosed with hypertension.

## 2016-08-14 ENCOUNTER — Telehealth: Payer: Self-pay

## 2016-08-14 NOTE — Telephone Encounter (Signed)
Will hold in triage to discuss with Patrice in am

## 2016-08-14 NOTE — Telephone Encounter (Signed)
Patient called back regarding scheduling an appointment - advised that she has been discharged and unable to schedule her. I advised her that she would need to contact her pcp and have them to refer to another pulmonary practice. -pr

## 2016-09-05 ENCOUNTER — Emergency Department (HOSPITAL_COMMUNITY): Payer: Medicaid Other

## 2016-09-05 ENCOUNTER — Encounter (HOSPITAL_COMMUNITY): Payer: Self-pay | Admitting: Emergency Medicine

## 2016-09-05 ENCOUNTER — Emergency Department (HOSPITAL_COMMUNITY)
Admission: EM | Admit: 2016-09-05 | Discharge: 2016-09-05 | Disposition: A | Discharge status: Home / Self Care | Payer: Medicaid Other | Attending: Emergency Medicine | Admitting: Emergency Medicine

## 2016-09-05 DIAGNOSIS — R0602 Shortness of breath: Secondary | ICD-10-CM | POA: Diagnosis present

## 2016-09-05 DIAGNOSIS — I110 Hypertensive heart disease with heart failure: Secondary | ICD-10-CM | POA: Insufficient documentation

## 2016-09-05 DIAGNOSIS — Z87891 Personal history of nicotine dependence: Secondary | ICD-10-CM | POA: Insufficient documentation

## 2016-09-05 DIAGNOSIS — J449 Chronic obstructive pulmonary disease, unspecified: Secondary | ICD-10-CM | POA: Insufficient documentation

## 2016-09-05 DIAGNOSIS — Z7984 Long term (current) use of oral hypoglycemic drugs: Secondary | ICD-10-CM | POA: Insufficient documentation

## 2016-09-05 DIAGNOSIS — J069 Acute upper respiratory infection, unspecified: Secondary | ICD-10-CM | POA: Diagnosis not present

## 2016-09-05 DIAGNOSIS — I509 Heart failure, unspecified: Secondary | ICD-10-CM | POA: Insufficient documentation

## 2016-09-05 DIAGNOSIS — J4521 Mild intermittent asthma with (acute) exacerbation: Secondary | ICD-10-CM | POA: Diagnosis not present

## 2016-09-05 DIAGNOSIS — E119 Type 2 diabetes mellitus without complications: Secondary | ICD-10-CM | POA: Insufficient documentation

## 2016-09-05 LAB — BASIC METABOLIC PANEL
ANION GAP: 6 (ref 5–15)
BUN: 20 mg/dL (ref 6–20)
CHLORIDE: 102 mmol/L (ref 101–111)
CO2: 31 mmol/L (ref 22–32)
Calcium: 9 mg/dL (ref 8.9–10.3)
Creatinine, Ser: 1.05 mg/dL — ABNORMAL HIGH (ref 0.44–1.00)
GFR calc Af Amer: >60 mL/min (ref >60)
GFR, EST NON AFRICAN AMERICAN: 58 mL/min — AB (ref >60)
GLUCOSE: 149 mg/dL — AB (ref 65–99)
POTASSIUM: 3.6 mmol/L (ref 3.5–5.1)
Sodium: 139 mmol/L (ref 135–145)

## 2016-09-05 LAB — URINALYSIS, ROUTINE W REFLEX MICROSCOPIC
Bilirubin Urine: NEGATIVE
GLUCOSE, UA: NEGATIVE mg/dL
Hgb urine dipstick: NEGATIVE
KETONES UR: NEGATIVE mg/dL
LEUKOCYTES UA: NEGATIVE
NITRITE: NEGATIVE
PH: 6 (ref 5.0–8.0)
Protein, ur: NEGATIVE mg/dL
SPECIFIC GRAVITY, URINE: 1.025 (ref 1.005–1.030)

## 2016-09-05 LAB — CBC
HEMATOCRIT: 35.4 % — AB (ref 36.0–46.0)
HEMOGLOBIN: 11.3 g/dL — AB (ref 12.0–15.0)
MCH: 24.8 pg — ABNORMAL LOW (ref 26.0–34.0)
MCHC: 31.9 g/dL (ref 30.0–36.0)
MCV: 77.6 fL — AB (ref 78.0–100.0)
Platelets: 453 10*3/uL — ABNORMAL HIGH (ref 150–400)
RBC: 4.56 MIL/uL (ref 3.87–5.11)
RDW: 16.4 % — AB (ref 11.5–15.5)
WBC: 10.1 10*3/uL (ref 4.0–10.5)

## 2016-09-05 MED ORDER — PREDNISONE 20 MG PO TABS: ORAL_TABLET | ORAL | 0 refills | Status: DC

## 2016-09-05 MED ORDER — IBUPROFEN 600 MG PO TABS: 600.0000 mg | ORAL_TABLET | Freq: Four times a day (QID) | ORAL | 0 refills | Status: DC | PRN

## 2016-09-05 MED ORDER — CYCLOBENZAPRINE HCL 10 MG PO TABS: 10.0000 mg | ORAL_TABLET | Freq: Two times a day (BID) | ORAL | 0 refills | Status: DC | PRN

## 2016-09-05 MED ORDER — BENZONATATE 100 MG PO CAPS
100.0000 mg | ORAL_CAPSULE | Freq: Once | ORAL | Status: AC
  Administered 2016-09-05: 100 mg via ORAL
  Filled 2016-09-05: qty 1

## 2016-09-05 MED ORDER — ALBUTEROL SULFATE (2.5 MG/3ML) 0.083% IN NEBU
5.0000 mg | INHALATION_SOLUTION | Freq: Once | RESPIRATORY_TRACT | Status: AC
  Administered 2016-09-05: 5 mg via RESPIRATORY_TRACT
  Filled 2016-09-05: qty 6

## 2016-09-05 MED ORDER — KETOROLAC TROMETHAMINE 60 MG/2ML IM SOLN
30.0000 mg | Freq: Once | INTRAMUSCULAR | Status: AC
  Administered 2016-09-05: 30 mg via INTRAMUSCULAR
  Filled 2016-09-05: qty 2

## 2016-09-05 MED ORDER — BENZONATATE 100 MG PO CAPS: 100.0000 mg | ORAL_CAPSULE | Freq: Three times a day (TID) | ORAL | 0 refills | Status: DC

## 2016-09-05 MED ORDER — ALBUTEROL (5 MG/ML) CONTINUOUS INHALATION SOLN
10.0000 mg/h | INHALATION_SOLUTION | Freq: Once | RESPIRATORY_TRACT | Status: AC
  Administered 2016-09-05: 10 mg/h via RESPIRATORY_TRACT
  Filled 2016-09-05: qty 20

## 2016-09-05 NOTE — ED Triage Notes (Signed)
Pt c/o abdominal pain, coughing, urinary incontinence, back pain, SOB, foot cramping x 2 months. Expiratory wheezing all fields.

## 2016-09-05 NOTE — ED Notes (Signed)
Pt ambulated in hall O2 stayed between 90-91

## 2016-09-05 NOTE — ED Notes (Signed)
Respiratory at bedside.

## 2016-09-05 NOTE — ED Notes (Signed)
Respiratory made aware of neb treatment ordered.  

## 2016-09-05 NOTE — ED Provider Notes (Signed)
[facility] DEPT Provider Note   CSN: [id number] Arrival date & time: 09/05/16  1436     History   Chief Complaint Chief Complaint  Patient presents with  . Shortness of Breath  . Back Pain    HPI Kathryn Mahoney is a 55 y.o. female.  HPI   55 year old female with history of diabetes, CHF, COPD, hypertension, asthma presenting with multiple complaints. Patient report for the past several weeks she has been having cold symptoms. She described as effective fever, chills, congestion, throat irritation, cough productive with white sputum, shortness of breath, wheezing, generalized body aches. She also endorsed having achy lower back pain and has been noticing strong urine odor for the past several days. She denies any severe headache, hemoptysis, abdominal pain, hematuria, vaginal bleeding or vaginal discharge. She has tried using a home inhaler with minimal improvement. She denies any prior history of intubation or ICU stay from asthma, medication. She mentioned that her weight has been fluctuating recently. Patient use oxygen at nighttime.  Past Medical History:  Diagnosis Date  . Asthma   . CHF (congestive heart failure) ([place])   . COPD (chronic obstructive pulmonary disease) ([place])   . Diabetes mellitus without complication ([place])   . Hypertension   . SOB (shortness of breath)   . Urinary incontinence     Patient Active Problem List   Diagnosis Date Noted  . Type II or unspecified type diabetes mellitus without mention of complication, not stated as uncontrolled 11/07/2013  . Hyperglycemia 11/06/2013  . COPD exacerbation ([place]) 11/02/2013  . PNA (pneumonia) 11/02/2013  . Hypoxia 11/02/2013  . Acute-on-chronic respiratory failure ([place]) 11/02/2013  . COPD (chronic obstructive pulmonary disease) ([place])   . CHF (congestive heart failure) ([place])   . Urinary incontinence   . Hypertension     Past Surgical History:  Procedure Laterality Date  . CESAREAN SECTION    . ECTOPIC  PREGNANCY SURGERY    . KNEE SURGERY      OB History    Gravida Para Term Preterm AB Living   5 4 4   1 4    SAB TAB Ectopic Multiple Live Births       1           Home Medications    Prior to Admission medications   Medication Sig Start Date End Date Taking? Authorizing Provider  albuterol (PROVENTIL HFA;VENTOLIN HFA) 108 (90 BASE) MCG/ACT inhaler Inhale 2 puffs into the lungs every 6 (six) hours as needed for wheezing or shortness of breath. 11/05/13  Yes Orson Eva, MD  albuterol (PROVENTIL) (2.5 MG/3ML) 0.083% nebulizer solution Inhale 60ml via nebulization EVERY 6 HOURS 08/20/16  Yes Historical Provider, MD  cloNIDine (CATAPRES) 0.1 MG tablet take 1 TABLET TWICE DAILY for 30 days 08/22/16  Yes Historical Provider, MD  hydrochlorothiazide (HYDRODIURIL) 25 MG tablet Take 25 mg by mouth daily. 08/19/16  Yes Historical Provider, MD  losartan (COZAAR) 50 MG tablet take 1 tablet BY MOUTH EVERY DAY for 30 days 08/22/16  Yes Historical Provider, MD  meloxicam (MOBIC) 15 MG tablet Take 1 tablet (15 mg total) by mouth daily as needed for pain. 07/11/16  Yes Antonietta Breach, PA-C  potassium chloride SA (K-DUR,KLOR-CON) 20 MEQ tablet Take 1 tablet (20 mEq total) by mouth 2 (two) times daily. 07/10/16  Yes Tanna Furry, MD  SYMBICORT 80-4.5 MCG/ACT inhaler inhale 2 puffs TWICE DAILY for 30 days 08/22/16  Yes Historical Provider, MD  cyclobenzaprine (FLEXERIL) 10 MG tablet Take 1 tablet (10  mg total) by mouth 2 (two) times daily as needed for muscle spasms. Patient not taking: Reported on 09/05/2016 03/06/16   Merryl Hacker, MD  furosemide (LASIX) 40 MG tablet Take 1 tablet (40 mg total) by mouth daily. Patient not taking: Reported on 09/05/2016 07/10/16   Tanna Furry, MD  furosemide (LASIX) 80 MG tablet take 1 tablet BY MOUTH EVERY DAY for 30 days 08/22/16   Historical Provider, MD  ibuprofen (ADVIL,MOTRIN) 600 MG tablet Take 1 tablet (600 mg total) by mouth every 6 (six) hours as needed. Patient not  taking: Reported on 09/05/2016 03/06/16   Merryl Hacker, MD  lisinopril (PRINIVIL,ZESTRIL) 20 MG tablet Take 1 tablet (20 mg total) by mouth daily. Patient not taking: Reported on 09/05/2016 07/09/16   Tanna Furry, MD  lisinopril (PRINIVIL,ZESTRIL) 40 MG tablet Take 40 mg by mouth daily. 08/20/16   Historical Provider, MD  metFORMIN (GLUCOPHAGE) 500 MG tablet Take 1 tablet (500 mg total) by mouth 2 (two) times daily with a meal. Patient not taking: Reported on 09/05/2016 11/07/13   Orson Eva, MD  phentermine (ADIPEX-P) 37.5 MG tablet Take 37.5 mg by mouth every morning 04/25/16   Historical Provider, MD  predniSONE (DELTASONE) 20 MG tablet Take 2 tablets (40 mg total) by mouth daily with breakfast. For the next four days Patient not taking: Reported on 09/05/2016 05/04/16   Carmin Muskrat, MD  traMADol (ULTRAM) 50 MG tablet Take 1 tablet (50 mg total) by mouth every 6 (six) hours as needed for severe pain. Patient not taking: Reported on 09/05/2016 07/11/16   Antonietta Breach, PA-C  triamcinolone cream (KENALOG) 0.5 % APPLY TWICE DAILY AS NEEDED FOR SKIN DISCOMFORT 04/21/16   Historical Provider, MD  VOLTAREN 1 % GEL 2 (two) Gram on skin two times daily, as needed for pain 04/04/16   Historical Provider, MD    Family History Family History  Problem Relation Age of Onset  . Stroke Mother   . Asthma Mother     Social History Social History  Substance Use Topics  . Smoking status: Former Smoker    Packs/day: 1.50    Years: 30.00    Types: Cigarettes    Quit date: 10/27/2010  . Smokeless tobacco: Never Used  . Alcohol use No     Allergies   Patient has no known allergies.   Review of Systems Review of Systems  All other systems reviewed and are negative.    Physical Exam Updated Vital Signs BP 161/93 (BP Location: Left Arm)   Pulse (!) 121   Temp 98.6 F (37 C) (Oral)   Resp 16   SpO2 93%   Physical Exam  Constitutional: She appears well-developed and well-nourished. No  distress.  Moderately obese female sitting in bed in no acute discomfort and nontoxic in appearance  HENT:  Head: Atraumatic.  Right Ear: External ear normal.  Left Ear: External ear normal.  Mouth/Throat: Oropharynx is clear and moist.  Eyes: Conjunctivae are normal.  Neck: Neck supple. No JVD present.  No nuchal rigidity  Cardiovascular:  Tachycardia without murmurs rubs or gallops  Pulmonary/Chest:  Faint scattered expiratory and inspiratory wheezes scattered throughout without rales  Abdominal: Soft. There is no tenderness.  Genitourinary:  Genitourinary Comments: No CVA tenderness  Musculoskeletal: She exhibits no edema.  Bilateral lower extremities without palpable cords erythema or edema  Neurological: She is alert.  Skin: No rash noted.  Psychiatric: She has a normal mood and affect.  Nursing note and vitals reviewed.  ED Treatments / Results  Labs (all labs ordered are listed, but only abnormal results are displayed) Labs Reviewed  BASIC METABOLIC PANEL - Abnormal; Notable for the following:       Result Value   Glucose, Bld 149 (*)    Creatinine, Ser 1.05 (*)    GFR calc non Af Amer 58 (*)    All other components within normal limits  CBC - Abnormal; Notable for the following:    Hemoglobin 11.3 (*)    HCT 35.4 (*)    MCV 77.6 (*)    MCH 24.8 (*)    RDW 16.4 (*)    Platelets 453 (*)    All other components within normal limits  URINALYSIS, ROUTINE W REFLEX MICROSCOPIC (NOT AT [facility])    EKG  EKG Interpretation None       Radiology Dg Chest 2 View  Result Date: 09/05/2016 CLINICAL DATA:  Shortness of breath, weakness, dry cough, former smoking history EXAM: CHEST  2 VIEW COMPARISON:  Chest x-ray of 07/09/2016 FINDINGS: No active infiltrate or effusion is seen. Moderate cardiomegaly is stable. No acute bony abnormality is seen. IMPRESSION: Stable moderate cardiomegaly.  No active lung disease. Electronically Signed   By: Ivar Drape M.D.   On:  09/05/2016 15:52    Procedures Procedures (including critical care time)  Medications Ordered in ED Medications  albuterol (PROVENTIL) (2.5 MG/3ML) 0.083% nebulizer solution 5 mg (5 mg Nebulization Given 09/05/16 1555)  albuterol (PROVENTIL,VENTOLIN) solution continuous neb (10 mg/hr Nebulization Given 09/05/16 1802)  benzonatate (TESSALON) capsule 100 mg (100 mg Oral Given 09/05/16 1813)  ketorolac (TORADOL) injection 30 mg (30 mg Intramuscular Given 09/05/16 1814)     Initial Impression / Assessment and Plan / ED Course  I have reviewed the triage vital signs and the nursing notes.  Pertinent labs & imaging results that were available during my care of the patient were reviewed by me and considered in my medical decision making (see chart for details).  Clinical Course     BP 151/94   Pulse 119   Temp 98.6 F (37 C) (Oral)   Resp 20   SpO2 100%  Tachycardia, 2/2 recent breathing treatment.   Final Clinical Impressions(s) / ED Diagnoses   Final diagnoses:  Viral upper respiratory infection  Mild intermittent asthma with exacerbation    New Prescriptions New Prescriptions   BENZONATATE (TESSALON) 100 MG CAPSULE    Take 1 capsule (100 mg total) by mouth every 8 (eight) hours.   PREDNISONE (DELTASONE) 20 MG TABLET    3 tabs po day one, then 2 tabs daily x 4 days   4:13 PM Patient presents with cold symptoms lasting for the past several weeks. She also has history of pulmonary disease including CHF, asthma and COPD. She does have wheezes on exam but no hypoxia. CONTINUE to treat her shortness of breath with albuterol nebs. She complaining of strong urine odor, will check UA to rule out UTI. I have low suspicion for acute CHF exacerbation, ACS, or PE.  8:00 PM After receiving breathing treatment, pt felt better.  She ambulate while maintaining O2 above 90%.  Pt discharge with cough medication, steroid as well as NSAIDs and muscle relaxant.  Return precaution discussed.       Domenic Moras, PA-C 09/05/16 2109    Tanna Furry, MD 09/24/16 [phone]

## 2016-09-05 NOTE — ED Notes (Signed)
ED Provider at bedside. 

## 2016-09-26 DIAGNOSIS — I272 Pulmonary hypertension, unspecified: Secondary | ICD-10-CM | POA: Insufficient documentation

## 2017-02-16 ENCOUNTER — Telehealth: Payer: Self-pay | Admitting: Cardiovascular Disease

## 2017-02-16 NOTE — Telephone Encounter (Signed)
Received records from [facility] for appointment on 02/23/17 with Dr Claiborne Billings.  Records put with Dr Evette Georges schedule for 02/23/17. lp

## 2017-02-23 ENCOUNTER — Ambulatory Visit: Payer: Medicaid Other | Admitting: Cardiovascular Disease

## 2017-02-23 ENCOUNTER — Encounter: Payer: Self-pay | Admitting: *Deleted

## 2017-03-16 ENCOUNTER — Ambulatory Visit: Payer: Medicaid Other | Admitting: Cardiovascular Disease

## 2017-03-17 ENCOUNTER — Encounter: Payer: Self-pay | Admitting: *Deleted

## 2017-03-21 ENCOUNTER — Emergency Department (HOSPITAL_COMMUNITY)
Admission: EM | Admit: 2017-03-21 | Discharge: 2017-03-22 | Disposition: A | Discharge status: Home / Self Care | Payer: Medicaid Other | Attending: Emergency Medicine | Admitting: Emergency Medicine

## 2017-03-21 ENCOUNTER — Encounter (HOSPITAL_COMMUNITY): Payer: Self-pay

## 2017-03-21 ENCOUNTER — Emergency Department (HOSPITAL_COMMUNITY): Payer: Medicaid Other

## 2017-03-21 DIAGNOSIS — E119 Type 2 diabetes mellitus without complications: Secondary | ICD-10-CM | POA: Diagnosis not present

## 2017-03-21 DIAGNOSIS — Z87891 Personal history of nicotine dependence: Secondary | ICD-10-CM | POA: Insufficient documentation

## 2017-03-21 DIAGNOSIS — J441 Chronic obstructive pulmonary disease with (acute) exacerbation: Secondary | ICD-10-CM | POA: Insufficient documentation

## 2017-03-21 DIAGNOSIS — I509 Heart failure, unspecified: Secondary | ICD-10-CM | POA: Insufficient documentation

## 2017-03-21 DIAGNOSIS — I110 Hypertensive heart disease with heart failure: Secondary | ICD-10-CM | POA: Diagnosis not present

## 2017-03-21 DIAGNOSIS — R0602 Shortness of breath: Secondary | ICD-10-CM | POA: Diagnosis present

## 2017-03-21 DIAGNOSIS — Z79899 Other long term (current) drug therapy: Secondary | ICD-10-CM | POA: Diagnosis not present

## 2017-03-21 LAB — I-STAT TROPONIN, ED: Troponin i, poc: 0.01 ng/mL (ref 0.00–0.08)

## 2017-03-21 LAB — CBC WITH DIFFERENTIAL/PLATELET
Basophils Absolute: 0 10*3/uL (ref 0.0–0.1)
Basophils Relative: 0 %
EOS PCT: 6 %
Eosinophils Absolute: 0.4 10*3/uL (ref 0.0–0.7)
HEMATOCRIT: 41.9 % (ref 36.0–46.0)
Hemoglobin: 12.8 g/dL (ref 12.0–15.0)
LYMPHS ABS: 1.5 10*3/uL (ref 0.7–4.0)
Lymphocytes Relative: 22 %
MCH: 24.2 pg — AB (ref 26.0–34.0)
MCHC: 30.5 g/dL (ref 30.0–36.0)
MCV: 79.4 fL (ref 78.0–100.0)
MONO ABS: 0.7 10*3/uL (ref 0.1–1.0)
Monocytes Relative: 10 %
NEUTROS ABS: 4.3 10*3/uL (ref 1.7–7.7)
Neutrophils Relative %: 62 %
PLATELETS: 381 10*3/uL (ref 150–400)
RBC: 5.28 MIL/uL — ABNORMAL HIGH (ref 3.87–5.11)
RDW: 19.7 % — ABNORMAL HIGH (ref 11.5–15.5)
WBC: 7 10*3/uL (ref 4.0–10.5)

## 2017-03-21 LAB — COMPREHENSIVE METABOLIC PANEL
ALBUMIN: 3.7 g/dL (ref 3.5–5.0)
ALT: 31 U/L (ref 14–54)
AST: 19 U/L (ref 15–41)
Alkaline Phosphatase: 89 U/L (ref 38–126)
Anion gap: 7 (ref 5–15)
BUN: 17 mg/dL (ref 6–20)
CHLORIDE: 100 mmol/L — AB (ref 101–111)
CO2: 35 mmol/L — ABNORMAL HIGH (ref 22–32)
Calcium: 9.5 mg/dL (ref 8.9–10.3)
Creatinine, Ser: 0.86 mg/dL (ref 0.44–1.00)
GFR calc Af Amer: >60 mL/min (ref >60)
GFR calc non Af Amer: >60 mL/min (ref >60)
GLUCOSE: 112 mg/dL — AB (ref 65–99)
POTASSIUM: 3.6 mmol/L (ref 3.5–5.1)
SODIUM: 142 mmol/L (ref 135–145)
Total Bilirubin: 0.4 mg/dL (ref 0.3–1.2)
Total Protein: 8.4 g/dL — ABNORMAL HIGH (ref 6.5–8.1)

## 2017-03-21 LAB — URINALYSIS, ROUTINE W REFLEX MICROSCOPIC
Bilirubin Urine: NEGATIVE
Glucose, UA: NEGATIVE mg/dL
Hgb urine dipstick: NEGATIVE
Ketones, ur: NEGATIVE mg/dL
LEUKOCYTES UA: NEGATIVE
NITRITE: NEGATIVE
PROTEIN: NEGATIVE mg/dL
Specific Gravity, Urine: 1.012 (ref 1.005–1.030)
pH: 6 (ref 5.0–8.0)

## 2017-03-21 LAB — BRAIN NATRIURETIC PEPTIDE: B Natriuretic Peptide: 38.8 pg/mL (ref 0.0–100.0)

## 2017-03-21 LAB — D-DIMER, QUANTITATIVE: D-Dimer, Quant: 0.34 ug/mL-FEU (ref 0.00–0.50)

## 2017-03-21 MED ORDER — ACETAMINOPHEN 325 MG PO TABS
650.0000 mg | ORAL_TABLET | Freq: Once | ORAL | Status: AC
  Administered 2017-03-21: 650 mg via ORAL
  Filled 2017-03-21: qty 2

## 2017-03-21 MED ORDER — METHYLPREDNISOLONE SODIUM SUCC 125 MG IJ SOLR
125.0000 mg | Freq: Once | INTRAMUSCULAR | Status: AC
  Administered 2017-03-21: 125 mg via INTRAVENOUS
  Filled 2017-03-21: qty 2

## 2017-03-21 MED ORDER — ALBUTEROL SULFATE HFA 108 (90 BASE) MCG/ACT IN AERS
1.0000 | INHALATION_SPRAY | Freq: Once | RESPIRATORY_TRACT | Status: AC
  Administered 2017-03-21: 1 via RESPIRATORY_TRACT
  Filled 2017-03-21: qty 6.7

## 2017-03-21 MED ORDER — ALBUTEROL SULFATE (2.5 MG/3ML) 0.083% IN NEBU: 2.5000 mg | INHALATION_SOLUTION | Freq: Four times a day (QID) | RESPIRATORY_TRACT | 12 refills | Status: DC | PRN

## 2017-03-21 MED ORDER — IPRATROPIUM BROMIDE 0.02 % IN SOLN
1.0000 mg | Freq: Once | RESPIRATORY_TRACT | Status: AC
  Administered 2017-03-21: 1 mg via RESPIRATORY_TRACT
  Filled 2017-03-21: qty 5

## 2017-03-21 MED ORDER — ALBUTEROL (5 MG/ML) CONTINUOUS INHALATION SOLN
10.0000 mg/h | INHALATION_SOLUTION | RESPIRATORY_TRACT | Status: AC
  Administered 2017-03-21: 10 mg/h via RESPIRATORY_TRACT
  Filled 2017-03-21: qty 20

## 2017-03-21 MED ORDER — AEROCHAMBER PLUS FLO-VU MEDIUM MISC
1.0000 | Freq: Once | Status: AC
  Administered 2017-03-21: 1
  Filled 2017-03-21: qty 1

## 2017-03-21 MED ORDER — FUROSEMIDE 10 MG/ML IJ SOLN
40.0000 mg | Freq: Once | INTRAMUSCULAR | Status: AC
  Administered 2017-03-21: 40 mg via INTRAVENOUS
  Filled 2017-03-21: qty 4

## 2017-03-21 MED ORDER — PREDNISONE 20 MG PO TABS: 40.0000 mg | ORAL_TABLET | Freq: Once | ORAL | Status: DC

## 2017-03-21 MED ORDER — ALBUTEROL SULFATE (2.5 MG/3ML) 0.083% IN NEBU
5.0000 mg | INHALATION_SOLUTION | Freq: Once | RESPIRATORY_TRACT | Status: AC
  Administered 2017-03-21: 5 mg via RESPIRATORY_TRACT
  Filled 2017-03-21: qty 6

## 2017-03-21 NOTE — ED Triage Notes (Signed)
She c/o being short of breath for past few days with increase in her dependent edema. She is in no distress. She also tells [place] she has had some brief epidsodes of chest pains during pst few days, but denies any chest pain now.

## 2017-03-21 NOTE — ED Notes (Signed)
Respiratory called

## 2017-03-21 NOTE — ED Provider Notes (Signed)
[facility] DEPT Provider Note   CSN: [id number] Arrival date & time: 03/21/17  1830     History   Chief Complaint Chief Complaint  Patient presents with  . Shortness of Breath    HPI Kathryn Mahoney is a 56 y.o. female.  Pt w PMHx diastolic CHF, COPD, HTN, E8BT, presents w worsening SOB x3days. Reports her SOB is worse w exertion, not improved w home oxygen (on 2L), assoc w non-productive cough. She also reports worsening b/l LE edema. Reports episode of CP that occurred 5 days ago that occurred during rest, described as "crucial" and lasting 4 min. Denies N/V, diaphoresis, abd pain, fever, HA, calf pain. Per chart, pt admitted in March of this year for shortness of breath. Pt was seen by Cardiologist in [facility] Dr. Claiborne Billings, on 02/23/17, who ran tests including a stress test. Per pt, stress test was normal. She states she has an appointment with a new Cardiologist in [place] scheduled for next week, though she doesn't know the name of the clinic. She is unable to report any new recommendations by the Cardiologist. States she is taking her medications as prescribed. Per her chart, she was recommended for a CPAP, though pt has not scheduled a sleep study appointment. Denies hx PE or DVT.      Past Medical History:  Diagnosis Date  . Asthma   . B12 deficiency   . CHF (congestive heart failure) ([place])   . COPD (chronic obstructive pulmonary disease) ([place])   . Diabetes mellitus without complication ([place])   . Hypertension   . Morbid obesity ([place])   . SOB (shortness of breath)   . Urinary incontinence     Patient Active Problem List   Diagnosis Date Noted  . Type II or unspecified type diabetes mellitus without mention of complication, not stated as uncontrolled 11/07/2013  . Hyperglycemia 11/06/2013  . COPD exacerbation ([place]) 11/02/2013  . PNA (pneumonia) 11/02/2013  . Hypoxia 11/02/2013  . Acute-on-chronic respiratory failure ([place]) 11/02/2013  . COPD (chronic  obstructive pulmonary disease) ([place])   . CHF (congestive heart failure) ([place])   . Urinary incontinence   . Hypertension     Past Surgical History:  Procedure Laterality Date  . CESAREAN SECTION    . ECTOPIC PREGNANCY SURGERY    . KNEE SURGERY      OB History    Gravida Para Term Preterm AB Living   5 4 4   1 4    SAB TAB Ectopic Multiple Live Births       1           Home Medications    Prior to Admission medications   Medication Sig Start Date End Date Taking? Authorizing Provider  amLODipine (NORVASC) 10 MG tablet Take 10 mg by mouth daily.   Yes [provider]  cholecalciferol (VITAMIN D) 1000 units tablet Take 1,000 Units by mouth daily.   Yes [provider]  folic acid (FOLVITE) 1 MG tablet Take 1 mg by mouth daily.   Yes [provider]  furosemide (LASIX) 80 MG tablet take 1 tablet BY MOUTH EVERY DAY for 30 days 08/22/16  Yes [provider]  hydrALAZINE (APRESOLINE) 25 MG tablet Take 25 mg by mouth 2 (two) times daily.   Yes [provider]  phentermine (ADIPEX-P) 37.5 MG tablet Take 37.5 mg by mouth every morning 04/25/16  Yes [provider]  potassium chloride SA (K-DUR,KLOR-CON) 20 MEQ tablet Take 20 mEq by mouth 2 (two) times  daily.   Yes [provider]  albuterol (PROVENTIL) (2.5 MG/3ML) 0.083% nebulizer solution Take 3 mLs (2.5 mg total) by nebulization every 6 (six) hours as needed for wheezing or shortness of breath. 03/21/17   Russo, [place] N, PA-C  predniSONE (DELTASONE) 20 MG tablet 3 tabs po day one, then 2 tabs daily x 4 days Patient not taking: Reported on 03/21/2017 09/05/16   Domenic Moras, PA-C    Family History Family History  Problem Relation Age of Onset  . Stroke Mother   . Asthma Mother     Social History Social History  Substance Use Topics  . Smoking status: Former Smoker    Packs/day: 1.50    Years: 30.00    Types: Cigarettes    Quit date: 10/27/2010  . Smokeless tobacco:  Never Used  . Alcohol use No     Allergies   Lisinopril   Review of Systems Review of Systems  Constitutional: Negative for fever.  HENT: Negative for congestion.   Respiratory: Positive for cough and shortness of breath.   Cardiovascular: Positive for leg swelling.  Gastrointestinal: Negative for abdominal pain and nausea.  Genitourinary: Negative for dysuria.  Musculoskeletal: Negative for back pain.  Skin: Negative for color change.  Allergic/Immunologic: Positive for immunocompromised state.  Neurological: Negative for headaches.     Physical Exam Updated Vital Signs BP 131/87 (BP Location: Right Arm)   Pulse (!) 121   Temp 98.9 F (37.2 C) (Oral)   Resp 20   Ht 5\' 9"  (1.753 m)   Wt 131.5 kg (290 lb)   SpO2 100%   BMI 42.83 kg/m   Physical Exam  Constitutional: She is oriented to person, place, and time. She appears well-developed and well-nourished. No distress.  Morbidly obese  HENT:  Head: Normocephalic and atraumatic.  Mouth/Throat: Oropharynx is clear and moist.  Eyes: Conjunctivae are normal.  Neck: Normal range of motion. Neck supple. JVD present.  Cardiovascular: Regular rhythm, normal heart sounds and intact distal pulses.  Exam reveals no gallop and no friction rub.   No murmur heard. tachycardic.  Pulmonary/Chest: Effort normal. Tachypnea noted. No respiratory distress. She has decreased breath sounds in the right upper field and the left upper field. She has wheezes (b/l). She has no rales.  Abdominal: Soft. Bowel sounds are normal. She exhibits no distension. There is no tenderness. There is no rebound and no guarding.  Musculoskeletal: Normal range of motion. She exhibits edema (b/l 1+ lower extremity edema).  Neurological: She is alert and oriented to person, place, and time.  Skin: Skin is warm. She is not diaphoretic.  Psychiatric: She has a normal mood and affect. Her behavior is normal.  Nursing note and vitals reviewed.    ED  Treatments / Results  Labs (all labs ordered are listed, but only abnormal results are displayed) Labs Reviewed  COMPREHENSIVE METABOLIC PANEL - Abnormal; Notable for the following:       Result Value   Chloride 100 (*)    CO2 35 (*)    Glucose, Bld 112 (*)    Total Protein 8.4 (*)    All other components within normal limits  CBC WITH DIFFERENTIAL/PLATELET - Abnormal; Notable for the following:    RBC 5.28 (*)    MCH 24.2 (*)    RDW 19.7 (*)    All other components within normal limits  URINALYSIS, ROUTINE W REFLEX MICROSCOPIC  BRAIN NATRIURETIC PEPTIDE  D-DIMER, QUANTITATIVE (NOT AT [facility])  Randolm Idol, ED  EKG  EKG Interpretation  Date/Time:  Saturday Mar 21 2017 18:39:47 EDT Ventricular Rate:  113 PR Interval:    QRS Duration: 108 QT Interval:  341 QTC Calculation: 468 R Axis:   79 Text Interpretation:  Sinus tachycardia No significant change since last tracing Confirmed by Orlie Dakin [phone]) on 03/21/2017 6:59:26 PM       Radiology Dg Chest 2 View  Result Date: 03/21/2017 CLINICAL DATA:  Shortness of breath, intermittent chest pain. EXAM: CHEST  2 VIEW COMPARISON:  Chest x-rays dated 01/16/2017 and 09/25/2016. FINDINGS: Cardiomegaly appears stable. Again noted is central pulmonary vascular congestion and bilateral interstitial prominence suggesting edema, not significantly changed compared to the previous exams, likely chronic CHF. No overt alveolar pulmonary edema appreciated. No confluent opacity to suggest a developing pneumonia. No pleural effusion or pneumothorax seen. Mild degenerative spurring noted within the thoracic spine. No acute or suspicious osseous finding. IMPRESSION: Cardiomegaly with central pulmonary vascular congestion and bilateral interstitial edema, similar to previous exams, suggesting chronic mild CHF. No evidence of overt alveolar pulmonary edema or pneumonia. Electronically Signed   By: Franki Cabot M.D.   On: 03/21/2017 19:30     Procedures Procedures (including critical care time)  Medications Ordered in ED Medications  albuterol (PROVENTIL,VENTOLIN) solution continuous neb (0 mg/hr Nebulization Stopped 03/21/17 2240)  albuterol (PROVENTIL HFA;VENTOLIN HFA) 108 (90 Base) MCG/ACT inhaler 1-2 puff (not administered)  AEROCHAMBER PLUS FLO-VU MEDIUM MISC 1 each (not administered)  albuterol (PROVENTIL) (2.5 MG/3ML) 0.083% nebulizer solution 5 mg (5 mg Nebulization Given 03/21/17 2027)  furosemide (LASIX) injection 40 mg (40 mg Intravenous Given 03/21/17 2027)  ipratropium (ATROVENT) nebulizer solution 1 mg (1 mg Nebulization Given 03/21/17 2130)  methylPREDNISolone sodium succinate (SOLU-MEDROL) 125 mg/2 mL injection 125 mg (125 mg Intravenous Given 03/21/17 2232)     Initial Impression / Assessment and Plan / ED Course  I have reviewed the triage vital signs and the nursing notes.  Pertinent labs & imaging results that were available during my care of the patient were reviewed by me and considered in my medical decision making (see chart for details).  Clinical Course as of Mar 21 2305  Sat Mar 21, 2017  2234 Pt w improvement in sx after continuous neb. Will ambulate pt and monitor O2 sat and sx to determine if pt is safe for discharge  [JR]    Clinical Course User Index [JR] Russo, [place] N, PA-C    Pt w SOB and LE edema, likely multifactorial. Pt w b/l expiratory wheezes, 1+ pitting edema b/l LE. CXR without change from previous, suggesting chronic mild CHF; no acute pathology. Given pt's tachycardia, d-dimer done and is negative. BNP and trop wnl. CBC and CMP unchanged from baseline. U/A unremarkable. Pt afebrile. Lasix given in ED. Albuterol neb given w some improvement, though not to baseline. Continuous albuterol/atrovent neb given w improvement in symptoms. Lungs still w b/l wheezing. Solumedrol given. Pt ambulated w home O2 and pulse ox; pt w O2 sat to 82 but returned to 90s within seconds, pt reported no  increased SOB. Pt reports overall sx improvement and is agreeable to discharge. Pt declines PO prednisone or abx. Will send w alb inhaler w spacer and Rx for neb tx, as pt has neb machine at home. Strict return precautions given. Pt to follow up w PCP. Pt hemodynamically stable, not in distress, safe for discharge home.  Patient discussed with and seen by Dr. Winfred Leeds, who agrees w care plan.  Discussed results, findings,  treatment and follow up. Patient advised of return precautions. Patient verbalized understanding and agreed with plan.    Final Clinical Impressions(s) / ED Diagnoses   Final diagnoses:  COPD exacerbation (HCC)    New Prescriptions New Prescriptions   ALBUTEROL (PROVENTIL) (2.5 MG/3ML) 0.083% NEBULIZER SOLUTION    Take 3 mLs (2.5 mg total) by nebulization every 6 (six) hours as needed for wheezing or shortness of breath.     Russo, [place] N, PA-C 03/21/17 2306    Orlie Dakin, MD 03/22/17 [phone]

## 2017-03-21 NOTE — Discharge Instructions (Signed)
Please read instructions below. You can use the albuterol inhaler or nebulizer every 6 hours as needed for shortness of breath or wheezing.  Schedule an appointment with your primary care provider to follow up on your visit today. Return to the ER for worsening shortness of breath, or new or worsening symptoms.

## 2017-03-21 NOTE — ED Notes (Signed)
Patient states they could not urinate right now but would let staff know when she was able to give sample.

## 2017-03-21 NOTE — ED Triage Notes (Signed)
Note: EKG performed, and placed on supplemental O2 at triage.

## 2017-03-21 NOTE — ED Notes (Addendum)
Pt walked on 2L and Pulse Ox. Pulse Ox remained between 85-92. Providers aware.

## 2017-03-21 NOTE — ED Provider Notes (Signed)
Complains of generalized weakness, dyspnea nonproductive cough for prostate 1 week. Associated symptoms include orthopnea over several weeks. Dyspnea is worse with exertion and improved with rest. She also reports increased bilateral leg edema over the past week. No other associated symptoms. Denies fever. No treatment prior to coming here, other than her usual medications. On exam no respiratory distress. Neck positive JVD lungs with prolonged rest for phase with extra wheezes heard heart mildly tachycardic regular rhythm abdomen morbidly obese, nontender extremities with 2+ pretibial pitting edema bilaterally Chest x-ray viewed by me   Orlie Dakin, MD 03/22/17 [phone]

## 2017-09-28 ENCOUNTER — Ambulatory Visit: Payer: Self-pay | Admitting: Allergy and Immunology

## 2017-11-20 ENCOUNTER — Other Ambulatory Visit: Payer: Self-pay

## 2017-11-20 ENCOUNTER — Encounter (HOSPITAL_COMMUNITY): Payer: Self-pay

## 2017-11-20 ENCOUNTER — Emergency Department (HOSPITAL_COMMUNITY)
Admission: EM | Admit: 2017-11-20 | Discharge: 2017-11-20 | Disposition: A | Discharge status: Home / Self Care | Payer: Medicaid Other | Attending: Emergency Medicine | Admitting: Emergency Medicine

## 2017-11-20 ENCOUNTER — Emergency Department (HOSPITAL_COMMUNITY): Payer: Medicaid Other

## 2017-11-20 DIAGNOSIS — I119 Hypertensive heart disease without heart failure: Secondary | ICD-10-CM | POA: Insufficient documentation

## 2017-11-20 DIAGNOSIS — R0602 Shortness of breath: Secondary | ICD-10-CM | POA: Insufficient documentation

## 2017-11-20 DIAGNOSIS — J45909 Unspecified asthma, uncomplicated: Secondary | ICD-10-CM | POA: Diagnosis not present

## 2017-11-20 DIAGNOSIS — Z79899 Other long term (current) drug therapy: Secondary | ICD-10-CM | POA: Insufficient documentation

## 2017-11-20 DIAGNOSIS — R8290 Unspecified abnormal findings in urine: Secondary | ICD-10-CM | POA: Diagnosis not present

## 2017-11-20 DIAGNOSIS — J449 Chronic obstructive pulmonary disease, unspecified: Secondary | ICD-10-CM | POA: Insufficient documentation

## 2017-11-20 DIAGNOSIS — Z87891 Personal history of nicotine dependence: Secondary | ICD-10-CM | POA: Insufficient documentation

## 2017-11-20 DIAGNOSIS — E119 Type 2 diabetes mellitus without complications: Secondary | ICD-10-CM | POA: Insufficient documentation

## 2017-11-20 DIAGNOSIS — R300 Dysuria: Secondary | ICD-10-CM | POA: Diagnosis present

## 2017-11-20 LAB — URINALYSIS, ROUTINE W REFLEX MICROSCOPIC
Bilirubin Urine: NEGATIVE
Glucose, UA: NEGATIVE mg/dL
Ketones, ur: NEGATIVE mg/dL
Leukocytes, UA: NEGATIVE
Nitrite: NEGATIVE
PROTEIN: 30 mg/dL — AB
Specific Gravity, Urine: 1.027 (ref 1.005–1.030)
pH: 5 (ref 5.0–8.0)

## 2017-11-20 LAB — CBC WITH DIFFERENTIAL/PLATELET
BASOS ABS: 0 10*3/uL (ref 0.0–0.1)
BASOS PCT: 0 %
Eosinophils Absolute: 0.3 10*3/uL (ref 0.0–0.7)
Eosinophils Relative: 2 %
HCT: 36.8 % (ref 36.0–46.0)
HEMOGLOBIN: 11.3 g/dL — AB (ref 12.0–15.0)
Lymphocytes Relative: 32 %
Lymphs Abs: 3.8 10*3/uL (ref 0.7–4.0)
MCH: 25.5 pg — ABNORMAL LOW (ref 26.0–34.0)
MCHC: 30.7 g/dL (ref 30.0–36.0)
MCV: 82.9 fL (ref 78.0–100.0)
MONO ABS: 0.6 10*3/uL (ref 0.1–1.0)
Monocytes Relative: 5 %
NEUTROS ABS: 7.2 10*3/uL (ref 1.7–7.7)
NEUTROS PCT: 61 %
Platelets: 439 10*3/uL — ABNORMAL HIGH (ref 150–400)
RBC: 4.44 MIL/uL (ref 3.87–5.11)
RDW: 17.7 % — AB (ref 11.5–15.5)
WBC: 11.9 10*3/uL — AB (ref 4.0–10.5)

## 2017-11-20 LAB — BASIC METABOLIC PANEL
ANION GAP: 9 (ref 5–15)
BUN: 18 mg/dL (ref 6–20)
CALCIUM: 9.3 mg/dL (ref 8.9–10.3)
CO2: 32 mmol/L (ref 22–32)
Chloride: 101 mmol/L (ref 101–111)
Creatinine, Ser: 0.78 mg/dL (ref 0.44–1.00)
Glucose, Bld: 101 mg/dL — ABNORMAL HIGH (ref 65–99)
POTASSIUM: 3.9 mmol/L (ref 3.5–5.1)
SODIUM: 142 mmol/L (ref 135–145)

## 2017-11-20 LAB — BRAIN NATRIURETIC PEPTIDE: B Natriuretic Peptide: 31.6 pg/mL (ref 0.0–100.0)

## 2017-11-20 LAB — WET PREP, GENITAL
Clue Cells Wet Prep HPF POC: NONE SEEN
SPERM: NONE SEEN
Trich, Wet Prep: NONE SEEN
YEAST WET PREP: NONE SEEN

## 2017-11-20 MED ORDER — ALBUTEROL SULFATE (2.5 MG/3ML) 0.083% IN NEBU
2.5000 mg | INHALATION_SOLUTION | Freq: Once | RESPIRATORY_TRACT | Status: AC
  Administered 2017-11-20: 2.5 mg via RESPIRATORY_TRACT
  Filled 2017-11-20: qty 3

## 2017-11-20 NOTE — Discharge Instructions (Signed)
Please schedule an appointment with your primary care provider to follow up on your visit today. Wear your CPAP as directed at night. Use your oxygen as needed for shortness of breath. Return to the ER for worsening shortness of breath, or new or concerning symptoms.

## 2017-11-20 NOTE — ED Notes (Signed)
Pt did not want to be hooked back up to vitals or the monitor after her x-ray. Pt has been waiting for awhile and is getting anxious. Pt informed about pelvic exam. She agreed to have the exam. Materials are set up at beside.

## 2017-11-20 NOTE — ED Notes (Signed)
Pt refused to have blood drawn for HIV and RPR. Stated that "it was not necessary."

## 2017-11-20 NOTE — ED Provider Notes (Signed)
Kathryn Mahoney   CSN: [id number] Arrival date & time: 11/20/17  1304     History   Chief Complaint Chief Complaint  Patient presents with  . Dysuria  . Bloated    HPI Kathryn Mahoney is a 57 y.o. female with past medical history of COPD, morbid obesity, hypertension, diabetes, CHF, and into the ED for dysuria and malodorous urine for 1 week.  Patient denies hematuria, abdominal pain, nausea, vomiting, fever, flank pain, vaginal bleeding or discharge. Patient also with second complaint of swelling in her abdomen, and shortness of breath with exertion and orthopnea that is worse than her baseline.  Patient with history of COPD, on CPAP in the evening, and intermittent O2 at home as needed.  On my evaluation, patient denies chest pain.  Denies cough, congestion, fever.  The history is provided by the patient.    Past Medical History:  Diagnosis Date  . Asthma   . B12 deficiency   . CHF (congestive heart failure) ([place])   . COPD (chronic obstructive pulmonary disease) ([place])   . Diabetes mellitus without complication ([place])   . Hypertension   . Morbid obesity ([place])   . SOB (shortness of breath)   . Urinary incontinence     Patient Active Problem List   Diagnosis Date Noted  . Type II or unspecified type diabetes mellitus without mention of complication, not stated as uncontrolled 11/07/2013  . Hyperglycemia 11/06/2013  . COPD exacerbation ([place]) 11/02/2013  . PNA (pneumonia) 11/02/2013  . Hypoxia 11/02/2013  . Acute-on-chronic respiratory failure ([place]) 11/02/2013  . COPD (chronic obstructive pulmonary disease) ([place])   . CHF (congestive heart failure) ([place])   . Urinary incontinence   . Hypertension     Past Surgical History:  Procedure Laterality Date  . CESAREAN SECTION    . ECTOPIC PREGNANCY SURGERY    . KNEE SURGERY      OB History    Gravida Para Term Preterm AB Living   5 4 4   1 4    SAB TAB Ectopic Multiple Live  Births       1           Home Medications    Prior to Admission medications   Medication Sig Start Date End Date Taking? Authorizing Provider  acetaminophen (TYLENOL) 500 MG tablet Take 1,000 mg by mouth daily as needed for headache.   Yes [provider]  albuterol (PROVENTIL) (2.5 MG/3ML) 0.083% nebulizer solution Take 3 mLs (2.5 mg total) by nebulization every 6 (six) hours as needed for wheezing or shortness of breath. 03/21/17  Yes Juliona Vales, [place] N, PA-C  albuterol (VENTOLIN HFA) 108 (90 Base) MCG/ACT inhaler Inhale 2 puffs into the lungs every 6 (six) hours as needed for wheezing or shortness of breath. 11/05/13  Yes [provider]  amLODipine (NORVASC) 10 MG tablet Take 10 mg by mouth daily.   Yes [provider]  aspirin EC 325 MG tablet Take 650 mg by mouth daily.   Yes [provider]  Aspirin-Caffeine 500-32.5 MG TABS Take 2 tablets by mouth daily as needed (BACK ACHES).   Yes [provider]  cetirizine (ZYRTEC) 10 MG tablet Take 10 mg by mouth daily.   Yes [provider]  clobetasol cream (TEMOVATE) 4.09 % Apply 1 application topically 2 (two) times daily. 11/06/17  Yes [provider]  furosemide (LASIX) 80 MG tablet take 1 tablet BY MOUTH EVERY DAY for 30 days 08/22/16  Yes  [provider]  GUAIFENESIN PO Take 1 tablet by mouth daily as needed (CONGESTION).   Yes [provider]  potassium chloride SA (K-DUR,KLOR-CON) 20 MEQ tablet Take 20 mEq by mouth 2 (two) times daily.   Yes [provider]  SYMBICORT 80-4.5 MCG/ACT inhaler Inhale 2 puffs into the lungs 2 (two) times daily. 11/07/17  Yes [provider]  predniSONE (DELTASONE) 20 MG tablet 3 tabs po day one, then 2 tabs daily x 4 days Patient not taking: Reported on 03/21/2017 09/05/16   Domenic Moras, PA-C    Family History Family History  Problem Relation Age of Onset  . Stroke Mother   . Asthma Mother     Social  History Social History   Tobacco Use  . Smoking status: Former Smoker    Packs/day: 1.50    Years: 30.00    Pack years: 45.00    Types: Cigarettes    Last attempt to quit: 10/27/2010    Years since quitting: 7.0  . Smokeless tobacco: Never Used  Substance Use Topics  . Alcohol use: No  . Drug use: No     Allergies   Lisinopril   Review of Systems Review of Systems  Constitutional: Negative for chills, diaphoresis and fever.  Respiratory: Positive for shortness of breath. Negative for cough.   Cardiovascular: Negative for chest pain and leg swelling.  Gastrointestinal: Positive for abdominal pain. Negative for nausea and vomiting.  Genitourinary: Positive for dysuria. Negative for flank pain, hematuria, vaginal bleeding and vaginal discharge.  All other systems reviewed and are negative.    Physical Exam Updated Vital Signs BP (!) 164/97   Pulse (!) 108   Temp 98.1 F (36.7 C) (Oral)   Resp 18   Ht 5\' 9"  (1.753 m)   Wt 131.5 kg (290 lb)   SpO2 98%   BMI 42.83 kg/m   Physical Exam  Constitutional: She appears well-developed and well-nourished. No distress.  Morbidly obese. Resting comfortably in bed.  HENT:  Head: Normocephalic and atraumatic.  Mouth/Throat: Oropharynx is clear and moist.  Eyes: Conjunctivae and EOM are normal. Pupils are equal, round, and reactive to light.  Neck: Normal range of motion. Neck supple.  Cardiovascular: Normal rate, regular rhythm, normal heart sounds and intact distal pulses. Exam reveals no gallop and no friction rub.  No murmur heard. Pulmonary/Chest: Effort normal and breath sounds normal. No stridor. No respiratory distress. She has no wheezes. She has no rales. She exhibits no tenderness.  No inc work of breathing  Abdominal: Soft. Bowel sounds are normal. She exhibits no mass. There is no tenderness. There is no rebound and no guarding.  No CVA tenderness  Genitourinary: Vagina normal. Cervix exhibits no motion tenderness.  Right adnexum displays no mass and no tenderness. Left adnexum displays no mass and no tenderness.  Genitourinary Comments: Exam performed w female chaperone present. Scant white cervical discharge.  Musculoskeletal:  Trace LE edema, no erythema or tenderness.  Neurological: She is alert.  Skin: Skin is warm.  Psychiatric: She has a normal mood and affect. Her behavior is normal.  Nursing Mahoney and vitals reviewed.    ED Treatments / Results  Labs (all labs ordered are listed, but only abnormal results are displayed) Labs Reviewed  WET PREP, GENITAL - Abnormal; Notable for the following components:      Result Value   WBC, Wet Prep HPF POC RARE (*)    All other components within normal limits  URINALYSIS, ROUTINE W REFLEX  MICROSCOPIC - Abnormal; Notable for the following components:   APPearance HAZY (*)    Hgb urine dipstick MODERATE (*)    Protein, ur 30 (*)    Bacteria, UA RARE (*)    Squamous Epithelial / LPF 6-30 (*)    All other components within normal limits  BASIC METABOLIC PANEL - Abnormal; Notable for the following components:   Glucose, Bld 101 (*)    All other components within normal limits  CBC WITH DIFFERENTIAL/PLATELET - Abnormal; Notable for the following components:   WBC 11.9 (*)    Hemoglobin 11.3 (*)    MCH 25.5 (*)    RDW 17.7 (*)    Platelets 439 (*)    All other components within normal limits  URINE CULTURE  BRAIN NATRIURETIC PEPTIDE  GC/CHLAMYDIA PROBE AMP ([place]) NOT AT [facility]    EKG  EKG Interpretation  Date/Time:  Friday November 20 2017 18:57:02 EST Ventricular Rate:  99 PR Interval:    QRS Duration: 110 QT Interval:  372 QTC Calculation: 478 R Axis:   73 Text Interpretation:  Sinus rhythm No significant change since last tracing Confirmed by Orlie Dakin [phone]) on 11/20/2017 7:13:57 PM       Radiology Dg Chest 2 View  Result Date: 11/20/2017 CLINICAL DATA:  Shortness of breath EXAM: CHEST  2 VIEW COMPARISON:  Chest  radiograph 05/22/2017 FINDINGS: Cardiomegaly and central pulmonary vascular congestion without overt edema. No focal consolidation, pneumothorax or pleural effusion. IMPRESSION: Cardiomegaly and central pulmonary vascular congestion without overt pulmonary edema. Electronically Signed   By: Ulyses Jarred M.D.   On: 11/20/2017 19:26    Procedures Procedures (including critical care time)  Medications Ordered in ED Medications  albuterol (PROVENTIL) (2.5 MG/3ML) 0.083% nebulizer solution 2.5 mg (2.5 mg Nebulization Given 11/20/17 1858)     Initial Impression / Assessment and Plan / ED Course  I have reviewed the triage vital signs and the nursing notes.  Pertinent labs & imaging results that were available during my care of the patient were reviewed by me and considered in my medical decision making (see chart for details).     Pt w COPD, presenting to ED with worsening SOB on exertion, as well as urinary sx. Pt concerned about CHF exacerbation, as well as UTI. No CP, no N/V or flank pain. On exam, pt without inc work of breathing, lungs CTAB, trace LE edema. Abdominal exam benign. Labs reassuring; normal BMP, BNP normal, U/A without infection. CXR stable from previous. EKG without acute abnormalities; appears to be slightly tachycardic at baseline. Pt with improvement in SOB after albuterol neb. Discussed all results and interpretations with patient. Given pt's concern about urinary sx, pelvic exam performed; exam benign. Wet prep neg. Pt became upset and requesting to leave stating she had nothing done for her. Had another lengthy discussion with patient, reviewing all of her labs once more. Pt requesting antibiotics for urinary sx, explained antibiotics are not prescribed when there is no evidence of infection. Discussed respiratory sx are not likely due to CHF exacerbation and no evidence of infectious etiology. Discussed importance of using CPAP at night and O2 at home when needed. Provided  opportunity for pt to ask additional questions, and answered all questions to the best of my ability. Discussed importance of PCP follow up if pt continues to have concerns regarding malodorous urine. Pt well-appearing, not in distress, safe for discharge.   Patient discussed with and seen by Dr. Winfred Leeds, who agrees with care plan  for discharge.  Discussed results, findings, treatment and follow up. Patient advised of return precautions. Patient verbalized understanding and agreed with plan.  Final Clinical Impressions(s) / ED Diagnoses   Final diagnoses:  Shortness of breath  Malodorous urine    ED Discharge Orders    None       Lovena Kluck, [place] N, PA-C 11/20/17 2336    Orlie Dakin, MD 11/20/17 [phone]

## 2017-11-20 NOTE — ED Triage Notes (Signed)
Pt reports that she has been experiencing dysuria and foul smelling urine for the last week. She wants to be treated for a UTI. She also reports increased bloating and swelling that she attributes to CHF. She also reports that she sometimes experiences centralized chest pain after taking her amlodipine at night, but denies chest pain now. Endorses SOB with exertion. A&Ox4.

## 2017-11-20 NOTE — ED Notes (Signed)
This RN came back into the pts room with her discharge instructions and the pt was very upset. She stated that she was not treated fairly and she wanted to speak to the charge nurse.  Charge came into the room and spoke with the pt. She was able to locate the PA who was able to explain her test results, again, to her. The pt is still very upset stating "I am never treated this way when I go to [facility]." Pt was able to be reassured about her results. She refused V/S but agreed to sign the signature pad for her discharge.

## 2017-11-20 NOTE — ED Notes (Signed)
Pt came into the hallway upset stating, "I have been here since 1 and nothing has been done, take this IV out so I can go home." This RN stated that I would talk to the doctor and would be in shortly.

## 2017-11-20 NOTE — ED Provider Notes (Signed)
Patient reports foul-smelling urine for approximately a week and feels as if she has a urinary tract infection.  She denies any nausea or vomiting she also feels "bloated" and diffuse swelling for approximately a week.  She denies shortness of breath but is requesting a breathing treatment.  On exam she speaks in paragraphs.  She is in no respiratory distress HEENT exam no facial asymmetry neck supple trachea midline lungs clear to auscultation heart mildly tachycardic abdomen morbidly obese, nontender bilateral lower extremities with trace pretibial pitting edema.  4 extremities neurovascular intact   Orlie Dakin, MD 11/20/17 2258

## 2017-11-21 LAB — URINE CULTURE: Culture: NO GROWTH

## 2017-11-23 LAB — GC/CHLAMYDIA PROBE AMP (~~LOC~~) NOT AT ARMC
CHLAMYDIA, DNA PROBE: NEGATIVE
NEISSERIA GONORRHEA: NEGATIVE

## 2018-11-17 DIAGNOSIS — D509 Iron deficiency anemia, unspecified: Secondary | ICD-10-CM | POA: Diagnosis present

## 2019-01-02 DIAGNOSIS — E559 Vitamin D deficiency, unspecified: Secondary | ICD-10-CM | POA: Insufficient documentation

## 2019-01-02 DIAGNOSIS — E538 Deficiency of other specified B group vitamins: Secondary | ICD-10-CM | POA: Insufficient documentation

## 2020-01-16 ENCOUNTER — Other Ambulatory Visit: Payer: Self-pay

## 2020-01-16 ENCOUNTER — Encounter (HOSPITAL_COMMUNITY): Payer: Self-pay | Admitting: Emergency Medicine

## 2020-01-16 ENCOUNTER — Ambulatory Visit (HOSPITAL_COMMUNITY)
Admission: EM | Admit: 2020-01-16 | Discharge: 2020-01-16 | Disposition: A | Discharge status: Home / Self Care | Payer: Medicaid Other

## 2020-01-16 DIAGNOSIS — M545 Low back pain: Secondary | ICD-10-CM | POA: Diagnosis not present

## 2020-01-16 DIAGNOSIS — R319 Hematuria, unspecified: Secondary | ICD-10-CM

## 2020-01-16 DIAGNOSIS — G8929 Other chronic pain: Secondary | ICD-10-CM | POA: Diagnosis not present

## 2020-01-16 LAB — POCT URINALYSIS DIP (DEVICE)
Bilirubin Urine: NEGATIVE
Glucose, UA: NEGATIVE mg/dL
Ketones, ur: NEGATIVE mg/dL
Leukocytes,Ua: NEGATIVE
Nitrite: NEGATIVE
Protein, ur: NEGATIVE mg/dL
Specific Gravity, Urine: >=1.03 (ref 1.005–1.030)
Urobilinogen, UA: 0.2 mg/dL (ref 0.0–1.0)
pH: 6 (ref 5.0–8.0)

## 2020-01-16 MED ORDER — TRAMADOL HCL 50 MG PO TABS: 25.0000 mg | ORAL_TABLET | Freq: Four times a day (QID) | ORAL | 0 refills | Status: DC | PRN

## 2020-01-16 MED ORDER — TRAMADOL HCL 50 MG PO TABS
50.0000 mg | ORAL_TABLET | Freq: Four times a day (QID) | ORAL | 0 refills | Status: AC | PRN
Start: 2020-01-16 — End: 2020-01-21

## 2020-01-16 NOTE — ED Provider Notes (Signed)
[place]    CSN: [id number] Arrival date & time: 01/16/20  1054      History   Chief Complaint Chief Complaint  Patient presents with  . Back Pain  . Urinary Tract Infection    HPI Kathryn Mahoney is a 59 y.o. female.   Patient with history of heart failure with preserved action fraction, COPD, diabetes and chronic back pain presents for lower back pain and concern of UTI.  She reports her back has hurt for a long time.  She reports she has seen her primary care and a spine specialist about this.  She reports she received an injection at the last spine appointment on 12/07/2019 and this helped for 1 day.  She is here requesting a "shot" for her pain.  She reports the pain is 8 out of 10 when walking and standing for long periods.  She reports it is aching and sharp.  Does not radiate down her legs.  It does not radiate up her back.  Denies numbness, tingling or weakness in her lower extremities.  She concern of a UTI because she has had intermittent blood in her urine over the last 1 month.  She denies painful urination or urinary frequency or urgency.  She denies vaginal bleeding.  She reports the blood is not always in her urine.  She is concerned about a UTI because of back pain with blood in her urine.  She denies abdominal pain, fever or chills.  Denies nausea and vomiting.  Patient denies shortness of breath or chest pain or lower extremity swelling.  She reports she is on 2 L of oxygen at home at baseline.  She reports regular primary care follow-up.     Past Medical History:  Diagnosis Date  . Asthma   . B12 deficiency   . CHF (congestive heart failure) ([place])   . COPD (chronic obstructive pulmonary disease) ([place])   . Diabetes mellitus without complication ([place])   . Hypertension   . Morbid obesity ([place])   . SOB (shortness of breath)   . Urinary incontinence     Patient Active Problem List   Diagnosis Date Noted  . Type II or unspecified type diabetes  mellitus without mention of complication, not stated as uncontrolled 11/07/2013  . Hyperglycemia 11/06/2013  . COPD exacerbation ([place]) 11/02/2013  . PNA (pneumonia) 11/02/2013  . Hypoxia 11/02/2013  . Acute-on-chronic respiratory failure ([place]) 11/02/2013  . COPD (chronic obstructive pulmonary disease) ([place])   . CHF (congestive heart failure) ([place])   . Urinary incontinence   . Hypertension     Past Surgical History:  Procedure Laterality Date  . CESAREAN SECTION    . ECTOPIC PREGNANCY SURGERY    . KNEE SURGERY      OB History    Gravida  5   Para  4   Term  4   Preterm      AB  1   Living  4     SAB      TAB      Ectopic  1   Multiple      Live Births               Home Medications    Prior to Admission medications   Medication Sig Start Date End Date Taking? Authorizing Provider  amLODipine (NORVASC) 10 MG tablet Take 10 mg by mouth daily.   Yes [provider]  aspirin EC 325 MG tablet Take 650 mg by mouth  daily.   Yes [provider]  furosemide (LASIX) 80 MG tablet take 1 tablet BY MOUTH EVERY DAY for 30 days 08/22/16  Yes [provider]  spironolactone (ALDACTONE) 25 MG tablet Take 25 mg by mouth daily. 10/31/19  Yes [provider]  acetaminophen (TYLENOL) 500 MG tablet Take 1,000 mg by mouth daily as needed for headache.    [provider]  albuterol (PROVENTIL) (2.5 MG/3ML) 0.083% nebulizer solution Take 3 mLs (2.5 mg total) by nebulization every 6 (six) hours as needed for wheezing or shortness of breath. 03/21/17   Robinson, [place] N, PA-C  albuterol (VENTOLIN HFA) 108 (90 Base) MCG/ACT inhaler Inhale 2 puffs into the lungs every 6 (six) hours as needed for wheezing or shortness of breath. 11/05/13   [provider]  Aspirin-Caffeine 500-32.5 MG TABS Take 2 tablets by mouth daily as needed (BACK ACHES).    [provider]  cetirizine (ZYRTEC) 10 MG tablet Take 10 mg by mouth daily.     [provider]  clobetasol cream (TEMOVATE) [id number] % Apply 1 application topically 2 (two) times daily. 11/06/17   [provider]  GUAIFENESIN PO Take 1 tablet by mouth daily as needed (CONGESTION).    [provider]  potassium chloride SA (K-DUR,KLOR-CON) 20 MEQ tablet Take 20 mEq by mouth 2 (two) times daily.    [provider]  predniSONE (DELTASONE) 20 MG tablet 3 tabs po day one, then 2 tabs daily x 4 days Patient not taking: Reported on 03/21/2017 09/05/16   Domenic Moras, PA-C  SYMBICORT 80-4.5 MCG/ACT inhaler Inhale 2 puffs into the lungs 2 (two) times daily. 11/07/17   [provider]  traMADol (ULTRAM) 50 MG tablet Take 1 tablet (50 mg total) by mouth every 6 (six) hours as needed for up to 5 days for moderate pain or severe pain. 01/16/20 01/21/20  Sahaana Weitman, Marguerita Beards, PA-C    Family History Family History  Problem Relation Age of Onset  . Stroke Mother   . Asthma Mother     Social History Social History   Tobacco Use  . Smoking status: Former Smoker    Packs/day: 1.50    Years: 30.00    Pack years: 45.00    Types: Cigarettes    Quit date: 10/27/2010    Years since quitting: 9.2  . Smokeless tobacco: Never Used  Substance Use Topics  . Alcohol use: No  . Drug use: No     Allergies   Lisinopril   Review of Systems Review of Systems  Constitutional: Negative for chills and fever.  HENT: Negative.   Respiratory: Negative for cough and shortness of breath.   Cardiovascular: Negative for chest pain, palpitations and leg swelling.  Gastrointestinal: Negative for abdominal pain.  Genitourinary: Positive for hematuria. Negative for decreased urine volume, difficulty urinating, dysuria, flank pain, frequency, pelvic pain, urgency, vaginal bleeding, vaginal discharge and vaginal pain.  Musculoskeletal: Positive for back pain. Negative for arthralgias, gait problem and myalgias.  Neurological: Negative for weakness and numbness.      Physical Exam Triage Vital Signs ED Triage Vitals [01/16/20 1126]  Enc Vitals Group     BP (!) 175/80     Pulse Rate (!) 113     Resp 18     Temp 98.4 F (36.9 C)     Temp Source Oral     SpO2 93 %     Weight      Height      Head  Circumference      Peak Flow      Pain Score 10     Pain Loc      Pain Edu?      Excl. in [place]?    No data found.  Updated Vital Signs BP (!) 175/80 (BP Location: Right Arm)   Pulse (!) 113   Temp 98.4 F (36.9 C) (Oral)   Resp 18   SpO2 93%   Visual Acuity Right Eye Distance:   Left Eye Distance:   Bilateral Distance:    Right Eye Near:   Left Eye Near:    Bilateral Near:     Physical Exam Vitals and nursing note reviewed.  Constitutional:      General: She is not in acute distress.    Appearance: She is well-developed. She is obese. She is not ill-appearing.  HENT:     Head: Normocephalic and atraumatic.  Eyes:     Extraocular Movements: Extraocular movements intact.     Conjunctiva/sclera: Conjunctivae normal.     Pupils: Pupils are equal, round, and reactive to light.  Cardiovascular:     Rate and Rhythm: Regular rhythm. Tachycardia present.     Heart sounds: No murmur.  Pulmonary:     Effort: Pulmonary effort is normal. No respiratory distress.     Breath sounds: Normal breath sounds. No wheezing, rhonchi or rales.  Abdominal:     Palpations: Abdomen is soft.     Tenderness: There is no abdominal tenderness. There is no right CVA tenderness or left CVA tenderness.  Musculoskeletal:     Cervical back: Neck supple.     Right lower leg: No edema.     Left lower leg: No edema.     Comments: Obvious deformity.  Patient has full range of motion through forward flexion extension lateral flexion and rotation of the spine not limited by pain.  She is mild tenderness to palpation at the left SI region.  No lumbar spinal tenderness or spasm appreciated.  Straight leg negative.  Patient able to extend legs in seated position  without issue.  Skin:    General: Skin is warm and dry.  Neurological:     General: No focal deficit present.     Mental Status: She is alert and oriented to person, place, and time.     Comments: 5/5 strength throughout lower extremities full range of motion.  Sensation equal bilaterally in lower extremities.      UC Treatments / Results  Labs (all labs ordered are listed, but only abnormal results are displayed) Labs Reviewed  POCT URINALYSIS DIP (DEVICE) - Abnormal; Notable for the following components:      Result Value   Hgb urine dipstick TRACE (*)    All other components within normal limits    EKG   Radiology No results found.  Procedures Procedures (including critical care time)  Medications Ordered in UC Medications - No data to display  Initial Impression / Assessment and Plan / UC Course  I have reviewed the triage vital signs and the nursing notes.  Pertinent labs & imaging results that were available during my care of the patient were reviewed by me and considered in my medical decision making (see chart for details).     #Chronic back pain #Hematuria Patient is 59 year old with past medical history of heart failure, diabetes, COPD and hypertension presenting with acute on chronic low back pain and hematuria.  With regard to her hematuria there is no sign of  infection with UA only showing trace blood.  She also has no accompanying UTI symptoms.  She denies any vaginal bleeding is unlikely this is causing her blood in her urine.  Unclear etiology this time recommend primary care follow-up.  I also discussed that she needs to follow-up with her primary care with regard to her chronic medical concerns. -With regard to her back pain again extensive discussion about her need to follow-up with the specialist that she has already been recommended.  Per chart review she has a pain management clinic appointment on 01/25/2020.  She recently saw spine specialist on  12/07/2019 and this is who recommended pain management follow-up.  Was also recommended that she receive an MRI for further evaluation however patient has not obtained this.  We discussed that this is chronic pain at this point in there are no injection therapies that we can safely give given her history of diabetes, hypertension and heart failure.  We discussed a short course of tramadol and that she needs to follow-up with her pain management provider and the spine specialist for continued evaluation of her back pain. -Patient with saturations at 93%, however she is on 2 L of oxygen at home believe this to be her baseline.  She is mildly tachycardic which is likely secondary to pain and slight hypoxia.  No chest pain, shortness of breath and lung exam benign.  Recommended follow-up with primary care in 1 to 2 weeks for reevaluation.  Emergency department precautions for chest pain shortness of breath were given patient verbalized understanding  Final Clinical Impressions(s) / UC Diagnoses   Final diagnoses:  Chronic bilateral low back pain without sciatica     Discharge Instructions     Take 1/2 to 1 tablet of the tramadol up to every 8 hours.  This may make you drowsy do not drive after taking this.  When she did take Tylenol, 2 regular strength tablets every 6 hours  I want you to follow-up with the spine doctor and the pain management clinic that you have been referred to previously.  We schedule a follow-up with your primary care in 1 week to discuss recent blood in your urine.  There is no sign of infection today.   If you develop chest pain or orders of breath please go to the emergency room.      ED Prescriptions    Medication Sig Dispense Auth. Provider   traMADol (ULTRAM) 50 MG tablet  (Status: Discontinued) Take 0.5 tablets (25 mg total) by mouth every 6 (six) hours as needed for up to 5 days for moderate pain or severe pain. 10 tablet Kushal Saunders, Marguerita Beards, PA-C   traMADol (ULTRAM) 50  MG tablet Take 1 tablet (50 mg total) by mouth every 6 (six) hours as needed for up to 5 days for moderate pain or severe pain. 15 tablet Kara Mierzejewski, Marguerita Beards, PA-C     I have reviewed the PDMP during this encounter.   Purnell Shoemaker, PA-C 01/16/20 1249

## 2020-01-16 NOTE — ED Triage Notes (Signed)
Complains of back pain and concerns for uti.  Back pain started one month ago Report back pain, blood in urine noticed one month ago

## 2020-01-16 NOTE — Discharge Instructions (Addendum)
Take 1/2 to 1 tablet of the tramadol up to every 8 hours.  This may make you drowsy do not drive after taking this.  When she did take Tylenol, 2 regular strength tablets every 6 hours  I want you to follow-up with the spine doctor and the pain management clinic that you have been referred to previously.  We schedule a follow-up with your primary care in 1 week to discuss recent blood in your urine.  There is no sign of infection today.   If you develop chest pain or orders of breath please go to the emergency room.

## 2020-07-11 ENCOUNTER — Emergency Department (HOSPITAL_COMMUNITY)
Admission: EM | Admit: 2020-07-11 | Discharge: 2020-07-12 | Disposition: A | Discharge status: Home / Self Care | Payer: Medicaid Other | Attending: Emergency Medicine | Admitting: Emergency Medicine

## 2020-07-11 ENCOUNTER — Other Ambulatory Visit: Payer: Self-pay

## 2020-07-11 ENCOUNTER — Emergency Department (HOSPITAL_COMMUNITY): Payer: Medicaid Other

## 2020-07-11 DIAGNOSIS — J45909 Unspecified asthma, uncomplicated: Secondary | ICD-10-CM | POA: Diagnosis not present

## 2020-07-11 DIAGNOSIS — I110 Hypertensive heart disease with heart failure: Secondary | ICD-10-CM | POA: Insufficient documentation

## 2020-07-11 DIAGNOSIS — Z7982 Long term (current) use of aspirin: Secondary | ICD-10-CM | POA: Diagnosis not present

## 2020-07-11 DIAGNOSIS — J441 Chronic obstructive pulmonary disease with (acute) exacerbation: Secondary | ICD-10-CM | POA: Diagnosis not present

## 2020-07-11 DIAGNOSIS — Z79899 Other long term (current) drug therapy: Secondary | ICD-10-CM | POA: Diagnosis not present

## 2020-07-11 DIAGNOSIS — E1165 Type 2 diabetes mellitus with hyperglycemia: Secondary | ICD-10-CM | POA: Insufficient documentation

## 2020-07-11 DIAGNOSIS — I509 Heart failure, unspecified: Secondary | ICD-10-CM | POA: Insufficient documentation

## 2020-07-11 DIAGNOSIS — R1084 Generalized abdominal pain: Secondary | ICD-10-CM | POA: Insufficient documentation

## 2020-07-11 DIAGNOSIS — Z87891 Personal history of nicotine dependence: Secondary | ICD-10-CM | POA: Insufficient documentation

## 2020-07-11 LAB — COMPREHENSIVE METABOLIC PANEL
ALT: 25 U/L (ref 0–44)
AST: 25 U/L (ref 15–41)
Albumin: 3.6 g/dL (ref 3.5–5.0)
Alkaline Phosphatase: 111 U/L (ref 38–126)
Anion gap: 16 — ABNORMAL HIGH (ref 5–15)
BUN: 15 mg/dL (ref 8–23)
CO2: 27 mmol/L (ref 22–32)
Calcium: 10 mg/dL (ref 8.9–10.3)
Chloride: 95 mmol/L — ABNORMAL LOW (ref 98–111)
Creatinine, Ser: 1.04 mg/dL — ABNORMAL HIGH (ref 0.44–1.00)
GFR calc Af Amer: >60 mL/min (ref >60)
GFR calc non Af Amer: 58 mL/min — ABNORMAL LOW (ref >60)
Glucose, Bld: 232 mg/dL — ABNORMAL HIGH (ref 70–99)
Potassium: 3.9 mmol/L (ref 3.5–5.1)
Sodium: 138 mmol/L (ref 135–145)
Total Bilirubin: 0.5 mg/dL (ref 0.3–1.2)
Total Protein: 8 g/dL (ref 6.5–8.1)

## 2020-07-11 LAB — CBC
HCT: 41.8 % (ref 36.0–46.0)
Hemoglobin: 12.2 g/dL (ref 12.0–15.0)
MCH: 24.2 pg — ABNORMAL LOW (ref 26.0–34.0)
MCHC: 29.2 g/dL — ABNORMAL LOW (ref 30.0–36.0)
MCV: 82.8 fL (ref 80.0–100.0)
Platelets: 506 10*3/uL — ABNORMAL HIGH (ref 150–400)
RBC: 5.05 MIL/uL (ref 3.87–5.11)
RDW: 17.2 % — ABNORMAL HIGH (ref 11.5–15.5)
WBC: 12.6 10*3/uL — ABNORMAL HIGH (ref 4.0–10.5)
nRBC: 0 % (ref 0.0–0.2)

## 2020-07-11 LAB — LIPASE, BLOOD: Lipase: 28 U/L (ref 11–51)

## 2020-07-11 MED ORDER — LACTATED RINGERS IV BOLUS
250.0000 mL | Freq: Once | INTRAVENOUS | Status: AC
  Administered 2020-07-12: 02:00:00 250 mL via INTRAVENOUS

## 2020-07-11 NOTE — ED Provider Notes (Signed)
59 year old female received at sign out from [place]. Per her HPI:   "Kathryn Mahoney is a 59 y.o. female with past medical history of DM, hypertension, CHF, COPD, chronic respiratory failure on oxygen, who presents today for evaluation of primarily abdominal pain.  She states that this afternoon at about 2 she began developing abdominal pain.  It is waxing and waning and is diffuse across her abdomen.  She states it "moves around."  She denies any urinary symptoms.  She reports that her shortness of breath is at her normal.  reports back pain however also notes chronic back pain.  She is vaccinated against covid.   She does report that a few days ago she started taking some leftover Augmentin for a "cold."  She states that she had nasal congestion.  She took this for 3 days.  She denies any nausea, vomiting, or diarrhea.  No new numbness or weakness in bilateral arms or legs.    Of note patient states that she is usually tachycardic, that 100-110 is a normal heart rate for her."    Physical Exam  BP 128/73 (BP Location: Right Arm)   Pulse (!) 104   Temp 98.5 F (36.9 C) (Oral)   Resp 13   SpO2 98%   Physical Exam Vitals and nursing note reviewed.  Constitutional:      Appearance: She is well-developed. She is obese.     Comments: No acute distress.  HENT:     Head: Normocephalic and atraumatic.  Cardiovascular:     Rate and Rhythm: Tachycardia present.  Pulmonary:     Effort: Pulmonary effort is normal.  Abdominal:     Tenderness: There is no abdominal tenderness.  Musculoskeletal:        General: Normal range of motion.     Cervical back: Normal range of motion.  Neurological:     Mental Status: She is alert and oriented to person, place, and time.     ED Course/Procedures     Procedures  MDM   59 year old female with past medical history of DM, hypertension, CHF, COPD, chronic respiratory failure on oxygen received a signout from [facility] pending CT abdomen  pelvis.  Please see her note for further work-up and medical decision making.  CT exam is unremarkable for acute findings.  On reevaluation, patient reports that abdominal pain has resolved.  Given intermittent nature, I suspect this may be secondary to Augmentin antibiotic earlier in the day.  Patient was counseled on antibiotic stewardship.  All questions answered.  She is hemodynamically stable and in no acute distress.  She will follow-up with her primary care clinician as needed.  ER return precautions given.  She is safe for discharge to home with outpatient follow-up as needed.    Joline Maxcy A, PA-C 07/12/20 0354    Orpah Greek, MD 07/13/20 [phone]

## 2020-07-11 NOTE — ED Triage Notes (Signed)
Pt here for eval of upper abdominal pain with radiaiton to back onset this morning just PTA. Denies n/v/d. Took abx on an empty stomach this morning.

## 2020-07-11 NOTE — ED Provider Notes (Signed)
[facility] EMERGENCY DEPARTMENT Provider Note   CSN: [id number] Arrival date & time: 07/11/20  1418     History Chief Complaint  Patient presents with  . Abdominal Pain    Kathryn Mahoney is a 59 y.o. female with past medical history of DM, hypertension, CHF, COPD, chronic respiratory failure on oxygen, who presents today for evaluation of primarily abdominal pain.  She states that this afternoon at about 2 she began developing abdominal pain.  It is waxing and waning and is diffuse across her abdomen.  She states it "moves around."  She denies any urinary symptoms.  She reports that her shortness of breath is at her normal.  reports back pain however also notes chronic back pain.  She is vaccinated against covid.   She does report that a few days ago she started taking some leftover Augmentin for a "cold."  She states that she had nasal congestion.  She took this for 3 days.  She denies any nausea, vomiting, or diarrhea.  No new numbness or weakness in bilateral arms or legs.    Of note patient states that she is usually tachycardic, that 100-110 is a normal heart rate for her.   HPI     Past Medical History:  Diagnosis Date  . Asthma   . B12 deficiency   . CHF (congestive heart failure) ([place])   . COPD (chronic obstructive pulmonary disease) ([place])   . Diabetes mellitus without complication ([place])   . Hypertension   . Morbid obesity ([place])   . SOB (shortness of breath)   . Urinary incontinence     Patient Active Problem List   Diagnosis Date Noted  . Type II or unspecified type diabetes mellitus without mention of complication, not stated as uncontrolled 11/07/2013  . Hyperglycemia 11/06/2013  . COPD exacerbation ([place]) 11/02/2013  . PNA (pneumonia) 11/02/2013  . Hypoxia 11/02/2013  . Acute-on-chronic respiratory failure ([place]) 11/02/2013  . COPD (chronic obstructive pulmonary disease) ([place])   . CHF (congestive heart failure) ([place])   . Urinary  incontinence   . Hypertension     Past Surgical History:  Procedure Laterality Date  . CESAREAN SECTION    . ECTOPIC PREGNANCY SURGERY    . KNEE SURGERY       OB History    Gravida  5   Para  4   Term  4   Preterm      AB  1   Living  4     SAB      TAB      Ectopic  1   Multiple      Live Births              Family History  Problem Relation Age of Onset  . Stroke Mother   . Asthma Mother     Social History   Tobacco Use  . Smoking status: Former Smoker    Packs/day: 1.50    Years: 30.00    Pack years: 45.00    Types: Cigarettes    Quit date: 10/27/2010    Years since quitting: 9.7  . Smokeless tobacco: Never Used  Substance Use Topics  . Alcohol use: No  . Drug use: No    Home Medications Prior to Admission medications   Medication Sig Start Date End Date Taking? Authorizing Provider  acetaminophen (TYLENOL) 500 MG tablet Take 1,000 mg by mouth daily as needed for headache.    [provider]  albuterol (PROVENTIL) (  2.5 MG/3ML) 0.083% nebulizer solution Take 3 mLs (2.5 mg total) by nebulization every 6 (six) hours as needed for wheezing or shortness of breath. 03/21/17   Robinson, [place] N, PA-C  albuterol (VENTOLIN HFA) 108 (90 Base) MCG/ACT inhaler Inhale 2 puffs into the lungs every 6 (six) hours as needed for wheezing or shortness of breath. 11/05/13   [provider]  amLODipine (NORVASC) 10 MG tablet Take 10 mg by mouth daily.    [provider]  aspirin EC 325 MG tablet Take 650 mg by mouth daily.    [provider]  Aspirin-Caffeine 500-32.5 MG TABS Take 2 tablets by mouth daily as needed (BACK ACHES).    [provider]  cetirizine (ZYRTEC) 10 MG tablet Take 10 mg by mouth daily.    [provider]  clobetasol cream (TEMOVATE) 8.31 % Apply 1 application topically 2 (two) times daily. 11/06/17   [provider]  furosemide (LASIX) 80 MG tablet take 1 tablet BY MOUTH EVERY DAY  for 30 days 08/22/16   [provider]  GUAIFENESIN PO Take 1 tablet by mouth daily as needed (CONGESTION).    [provider]  potassium chloride SA (K-DUR,KLOR-CON) 20 MEQ tablet Take 20 mEq by mouth 2 (two) times daily.    [provider]  predniSONE (DELTASONE) 20 MG tablet 3 tabs po day one, then 2 tabs daily x 4 days Patient not taking: Reported on 03/21/2017 09/05/16   Domenic Moras, PA-C  spironolactone (ALDACTONE) 25 MG tablet Take 25 mg by mouth daily. 10/31/19   [provider]  SYMBICORT 80-4.5 MCG/ACT inhaler Inhale 2 puffs into the lungs 2 (two) times daily. 11/07/17   [provider]    Allergies    Lisinopril  Review of Systems   Review of Systems  Constitutional: Negative for chills and fever.  HENT: Negative for congestion and rhinorrhea.   Respiratory: Positive for shortness of breath (Chronic, unchanged).   Cardiovascular: Negative for palpitations and leg swelling.  Gastrointestinal: Positive for abdominal pain. Negative for constipation (Last BM this morning, was normal), diarrhea, nausea and vomiting.  Genitourinary: Negative for dysuria.  Musculoskeletal: Positive for back pain. Negative for neck pain.  Skin: Negative for color change, rash and wound.  Neurological: Negative for weakness and headaches.  Psychiatric/Behavioral: Negative for confusion.  All other systems reviewed and are negative.   Physical Exam Updated Vital Signs BP 128/73 (BP Location: Right Arm)   Pulse (!) 104   Temp 98.5 F (36.9 C) (Oral)   Resp 13   SpO2 98%   Physical Exam Vitals and nursing note reviewed.  Constitutional:      General: She is not in acute distress.    Appearance: She is well-developed. She is obese.  HENT:     Head: Normocephalic and atraumatic.     Mouth/Throat:     Mouth: Mucous membranes are moist.  Eyes:     Conjunctiva/sclera: Conjunctivae normal.  Cardiovascular:     Rate and Rhythm: Regular rhythm.  Tachycardia present.     Heart sounds: Normal heart sounds. No murmur heard.   Pulmonary:     Effort: Pulmonary effort is normal. No respiratory distress.     Breath sounds: Normal breath sounds.  Abdominal:     General: Bowel sounds are normal.     Palpations: Abdomen is soft.     Tenderness: There is generalized abdominal tenderness.     Comments: Exam is limited by body habitus.  Musculoskeletal:  Cervical back: Neck supple.  Skin:    General: Skin is warm and dry.  Neurological:     Mental Status: She is alert.     Comments: Patient is awake and alert.  Answers questions appropriately.  Psychiatric:        Mood and Affect: Mood normal.        Behavior: Behavior normal.     ED Results / Procedures / Treatments   Labs (all labs ordered are listed, but only abnormal results are displayed) Labs Reviewed  COMPREHENSIVE METABOLIC PANEL - Abnormal; Notable for the following components:      Result Value   Chloride 95 (*)    Glucose, Bld 232 (*)    Creatinine, Ser 1.04 (*)    GFR calc non Af Amer 58 (*)    Anion gap 16 (*)    All other components within normal limits  CBC - Abnormal; Notable for the following components:   WBC 12.6 (*)    MCH 24.2 (*)    MCHC 29.2 (*)    RDW 17.2 (*)    Platelets 506 (*)    All other components within normal limits  SARS CORONAVIRUS 2 BY RT PCR (HOSPITAL ORDER, [facility])  LIPASE, BLOOD  URINALYSIS, ROUTINE W REFLEX MICROSCOPIC  TROPONIN I (HIGH SENSITIVITY)    EKG None  Radiology DG Chest 2 View  Result Date: 07/11/2020 CLINICAL DATA:  Abdomen pain EXAM: CHEST - 2 VIEW COMPARISON:  10/30/2018 FINDINGS: Cardiomegaly with vascular congestion and mild diffuse hazy and interstitial opacity likely edema. No significant effusion. No pneumothorax. IMPRESSION: Cardiomegaly with vascular congestion and mild pulmonary edema. Electronically Signed   By: Donavan Foil M.D.   On: 07/11/2020 22:55     Procedures Procedures (including critical care time)  Medications Ordered in ED Medications  lactated ringers bolus 250 mL (has no administration in time range)    ED Course  I have reviewed the triage vital signs and the nursing notes.  Pertinent labs & imaging results that were available during my care of the patient were reviewed by me and considered in my medical decision making (see chart for details).    MDM Rules/Calculators/A&P                          Patient is a 59 year old woman who presents today for evaluation of diffuse abdominal pain.  She came here shortly after the symptoms started.  No aggravating or alleviating symptoms noted.  She is slightly tachycardic here at 104, reports that that is consistent with her usual baseline.  She is chronically on oxygen at home.  CBC does show mild leukocytosis at 12.6, however it appears on her most recent labs she also had mild leukocytosis, unsure if this is significant or not.  Lipase is not elevated.  CMP shows slightly elevated anion.  Hyperglycemia 232.  Chest x-ray shows cardiomegaly with vascular congestion and mild pulmonary edema.    EKG is ordered.  Given her abdominal pain CT abdomen pelvis is ordered.  Additionally Covid test is ordered.  At shift change care was transferred to [facility] who will follow pending studies, re-evaulate and determine disposition.    Kathryn Mahoney was evaluated in Emergency Department on 07/11/2020 for the symptoms described in the history of present illness. She was evaluated in the context of the global COVID-19 pandemic, which necessitated consideration that the patient might be at risk for infection  with the SARS-CoV-2 virus that causes COVID-19. Institutional protocols and algorithms that pertain to the evaluation of patients at risk for COVID-19 are in a state of rapid change based on information released by regulatory bodies including the CDC and federal and state  organizations. These policies and algorithms were followed during the patient's care in the ED.   Note: Portions of this report may have been transcribed using voice recognition software. Every effort was made to ensure accuracy; however, inadvertent computerized transcription errors may be present  Final Clinical Impression(s) / ED Diagnoses Final diagnoses:  Generalized abdominal pain    Rx / DC Orders ED Discharge Orders    None       Ollen Gross 07/11/20 2354    Dorie Rank, MD 07/12/20 [phone]

## 2020-07-11 NOTE — Discharge Instructions (Addendum)
Thank you for allowing me to care for you today in the Emergency Department.   It is very important you were given a prescription of antibiotics that you complete the entire course.  You should not save pills from a previous antibiotic course to take at another time.  Augmentin, which contains amoxicillin, can cause abdominal cramping and diarrhea is a side effect of the medication.  Please follow-up with your primary care provider.    Return to the emergency department if you develop severe abdominal pain, high fevers, uncontrollable vomiting, or other new, concerning symptoms.

## 2020-07-12 ENCOUNTER — Emergency Department (HOSPITAL_COMMUNITY): Payer: Medicaid Other

## 2020-07-12 MED ORDER — IOHEXOL 300 MG/ML  SOLN
100.0000 mL | Freq: Once | INTRAMUSCULAR | Status: AC | PRN
  Administered 2020-07-12: 100 mL via INTRAVENOUS

## 2020-07-12 NOTE — ED Notes (Signed)
To ct

## 2020-07-12 NOTE — ED Notes (Signed)
covid swab  Cancelled by dr Betsey Holiday  The pt has had vaccione x 2 shots

## 2020-07-12 NOTE — ED Notes (Signed)
Pt c/o abd pain.

## 2020-08-27 ENCOUNTER — Other Ambulatory Visit: Payer: Self-pay

## 2020-08-27 ENCOUNTER — Encounter (HOSPITAL_COMMUNITY): Payer: Self-pay

## 2020-08-27 ENCOUNTER — Emergency Department (HOSPITAL_COMMUNITY)
Admission: EM | Admit: 2020-08-27 | Discharge: 2020-08-27 | Disposition: A | Discharge status: Home / Self Care | Payer: Medicaid Other | Attending: Emergency Medicine | Admitting: Emergency Medicine

## 2020-08-27 ENCOUNTER — Emergency Department (HOSPITAL_COMMUNITY): Payer: Medicaid Other

## 2020-08-27 DIAGNOSIS — J45909 Unspecified asthma, uncomplicated: Secondary | ICD-10-CM | POA: Insufficient documentation

## 2020-08-27 DIAGNOSIS — J449 Chronic obstructive pulmonary disease, unspecified: Secondary | ICD-10-CM | POA: Insufficient documentation

## 2020-08-27 DIAGNOSIS — Z7982 Long term (current) use of aspirin: Secondary | ICD-10-CM | POA: Diagnosis not present

## 2020-08-27 DIAGNOSIS — Z87891 Personal history of nicotine dependence: Secondary | ICD-10-CM | POA: Insufficient documentation

## 2020-08-27 DIAGNOSIS — Z79899 Other long term (current) drug therapy: Secondary | ICD-10-CM | POA: Insufficient documentation

## 2020-08-27 DIAGNOSIS — E8770 Fluid overload, unspecified: Secondary | ICD-10-CM | POA: Insufficient documentation

## 2020-08-27 DIAGNOSIS — Z20822 Contact with and (suspected) exposure to covid-19: Secondary | ICD-10-CM | POA: Insufficient documentation

## 2020-08-27 DIAGNOSIS — R0602 Shortness of breath: Secondary | ICD-10-CM | POA: Diagnosis present

## 2020-08-27 DIAGNOSIS — E119 Type 2 diabetes mellitus without complications: Secondary | ICD-10-CM | POA: Diagnosis not present

## 2020-08-27 DIAGNOSIS — I110 Hypertensive heart disease with heart failure: Secondary | ICD-10-CM | POA: Insufficient documentation

## 2020-08-27 DIAGNOSIS — I5033 Acute on chronic diastolic (congestive) heart failure: Secondary | ICD-10-CM

## 2020-08-27 DIAGNOSIS — Z7951 Long term (current) use of inhaled steroids: Secondary | ICD-10-CM | POA: Insufficient documentation

## 2020-08-27 LAB — CBC WITH DIFFERENTIAL/PLATELET
Abs Immature Granulocytes: 0.07 10*3/uL (ref 0.00–0.07)
Basophils Absolute: 0 10*3/uL (ref 0.0–0.1)
Basophils Relative: 0 %
Eosinophils Absolute: 0.3 10*3/uL (ref 0.0–0.5)
Eosinophils Relative: 2 %
HCT: 37.9 % (ref 36.0–46.0)
Hemoglobin: 11.1 g/dL — ABNORMAL LOW (ref 12.0–15.0)
Immature Granulocytes: 1 %
Lymphocytes Relative: 27 %
Lymphs Abs: 2.9 10*3/uL (ref 0.7–4.0)
MCH: 24.9 pg — ABNORMAL LOW (ref 26.0–34.0)
MCHC: 29.3 g/dL — ABNORMAL LOW (ref 30.0–36.0)
MCV: 85 fL (ref 80.0–100.0)
Monocytes Absolute: 0.5 10*3/uL (ref 0.1–1.0)
Monocytes Relative: 5 %
Neutro Abs: 7 10*3/uL (ref 1.7–7.7)
Neutrophils Relative %: 65 %
Platelets: 455 10*3/uL — ABNORMAL HIGH (ref 150–400)
RBC: 4.46 MIL/uL (ref 3.87–5.11)
RDW: 17.2 % — ABNORMAL HIGH (ref 11.5–15.5)
WBC: 10.7 10*3/uL — ABNORMAL HIGH (ref 4.0–10.5)
nRBC: 0 % (ref 0.0–0.2)

## 2020-08-27 LAB — COMPREHENSIVE METABOLIC PANEL
ALT: 22 U/L (ref 0–44)
AST: 21 U/L (ref 15–41)
Albumin: 3.6 g/dL (ref 3.5–5.0)
Alkaline Phosphatase: 98 U/L (ref 38–126)
Anion gap: 11 (ref 5–15)
BUN: 17 mg/dL (ref 8–23)
CO2: 30 mmol/L (ref 22–32)
Calcium: 9.4 mg/dL (ref 8.9–10.3)
Chloride: 100 mmol/L (ref 98–111)
Creatinine, Ser: 1.09 mg/dL — ABNORMAL HIGH (ref 0.44–1.00)
GFR, Estimated: 58 mL/min — ABNORMAL LOW (ref >60)
Glucose, Bld: 207 mg/dL — ABNORMAL HIGH (ref 70–99)
Potassium: 4.2 mmol/L (ref 3.5–5.1)
Sodium: 141 mmol/L (ref 135–145)
Total Bilirubin: 0.3 mg/dL (ref 0.3–1.2)
Total Protein: 8 g/dL (ref 6.5–8.1)

## 2020-08-27 LAB — TROPONIN I (HIGH SENSITIVITY)
Troponin I (High Sensitivity): 14 ng/L (ref <18)
Troponin I (High Sensitivity): 14 ng/L (ref <18)

## 2020-08-27 LAB — I-STAT CHEM 8, ED
BUN: 20 mg/dL (ref 8–23)
Calcium, Ion: 1.23 mmol/L (ref 1.15–1.40)
Chloride: 100 mmol/L (ref 98–111)
Creatinine, Ser: 1.2 mg/dL — ABNORMAL HIGH (ref 0.44–1.00)
Glucose, Bld: 209 mg/dL — ABNORMAL HIGH (ref 70–99)
HCT: 37 % (ref 36.0–46.0)
Hemoglobin: 12.6 g/dL (ref 12.0–15.0)
Potassium: 4.4 mmol/L (ref 3.5–5.1)
Sodium: 142 mmol/L (ref 135–145)
TCO2: 34 mmol/L — ABNORMAL HIGH (ref 22–32)

## 2020-08-27 LAB — RESPIRATORY PANEL BY RT PCR (FLU A&B, COVID)
Influenza A by PCR: NEGATIVE
Influenza B by PCR: NEGATIVE
SARS Coronavirus 2 by RT PCR: NEGATIVE

## 2020-08-27 LAB — BRAIN NATRIURETIC PEPTIDE: B Natriuretic Peptide: 88.2 pg/mL (ref 0.0–100.0)

## 2020-08-27 MED ORDER — TORSEMIDE 20 MG PO TABS: 40.0000 mg | ORAL_TABLET | Freq: Every day | ORAL | 0 refills | Status: DC

## 2020-08-27 MED ORDER — KETOROLAC TROMETHAMINE 30 MG/ML IJ SOLN
15.0000 mg | Freq: Once | INTRAMUSCULAR | Status: AC
  Administered 2020-08-27: 15 mg via INTRAVENOUS
  Filled 2020-08-27: qty 1

## 2020-08-27 MED ORDER — FUROSEMIDE 10 MG/ML IJ SOLN
40.0000 mg | Freq: Once | INTRAMUSCULAR | Status: AC
  Administered 2020-08-27: 40 mg via INTRAVENOUS
  Filled 2020-08-27: qty 4

## 2020-08-27 NOTE — Discharge Instructions (Signed)
Increase torsemide to 40 mg daily x 3 days.   See heart failure clinic for follow up   Return to ER if you have worse swelling, shortness of breath, chest pain

## 2020-08-27 NOTE — ED Notes (Signed)
Pt ambulated in the hall and back to room. O2 Stats did not go below 93% and pt stated she did not have any weakness or dizziness during ambulation.

## 2020-08-27 NOTE — ED Provider Notes (Signed)
[facility] DEPT Provider Note   CSN: [id number] Arrival date & time: 08/27/20  1653     History Chief Complaint  Patient presents with  . Fluid retention    Kathryn Mahoney is a 59 y.o. female history of diastolic heart failure, hypertension, morbid obesity, here presenting with shortness of breath.  Patient has been having shortness of breath for the last week or so.  Patient also has some leg swelling.  Patient states that she has some shortness of breath with exertion and she wears CPAP at night she denies any worsening shortness of breath when she was sleeping.  She went to PCP today and she has a 10 pound weight gain.  Patient was sent here for CHF exacerbation.  Patient is compliant with her torsemide.  The history is provided by the patient.       Past Medical History:  Diagnosis Date  . Asthma   . B12 deficiency   . CHF (congestive heart failure) ([place])   . COPD (chronic obstructive pulmonary disease) ([place])   . Diabetes mellitus without complication ([place])   . Hypertension   . Morbid obesity ([place])   . SOB (shortness of breath)   . Urinary incontinence     Patient Active Problem List   Diagnosis Date Noted  . Type II or unspecified type diabetes mellitus without mention of complication, not stated as uncontrolled 11/07/2013  . Hyperglycemia 11/06/2013  . COPD exacerbation ([place]) 11/02/2013  . PNA (pneumonia) 11/02/2013  . Hypoxia 11/02/2013  . Acute-on-chronic respiratory failure ([place]) 11/02/2013  . COPD (chronic obstructive pulmonary disease) ([place])   . CHF (congestive heart failure) ([place])   . Urinary incontinence   . Hypertension     Past Surgical History:  Procedure Laterality Date  . CESAREAN SECTION    . ECTOPIC PREGNANCY SURGERY    . KNEE SURGERY       OB History    Gravida  5   Para  4   Term  4   Preterm      AB  1   Living  4     SAB      TAB      Ectopic  1   Multiple      Live Births                Family History  Problem Relation Age of Onset  . Stroke Mother   . Asthma Mother     Social History   Tobacco Use  . Smoking status: Former Smoker    Packs/day: 1.50    Years: 30.00    Pack years: 45.00    Types: Cigarettes    Quit date: 10/27/2010    Years since quitting: 9.8  . Smokeless tobacco: Never Used  Substance Use Topics  . Alcohol use: No  . Drug use: No    Home Medications Prior to Admission medications   Medication Sig Start Date End Date Taking? Authorizing Provider  acetaminophen (TYLENOL) 500 MG tablet Take 1,000 mg by mouth daily as needed for headache.    [provider]  albuterol (PROVENTIL) (2.5 MG/3ML) 0.083% nebulizer solution Take 3 mLs (2.5 mg total) by nebulization every 6 (six) hours as needed for wheezing or shortness of breath. 03/21/17   Robinson, [place] N, PA-C  albuterol (VENTOLIN HFA) 108 (90 Base) MCG/ACT inhaler Inhale 2 puffs into the lungs every 6 (six) hours as needed for wheezing or shortness of breath. 11/05/13   [provider]  amLODipine (NORVASC) 10 MG tablet Take 10 mg by mouth daily.    [provider]  aspirin EC 325 MG tablet Take 650 mg by mouth daily.    [provider]  Aspirin-Caffeine 500-32.5 MG TABS Take 2 tablets by mouth daily as needed (BACK ACHES).    [provider]  cetirizine (ZYRTEC) 10 MG tablet Take 10 mg by mouth daily.    [provider]  clobetasol cream (TEMOVATE) 5.63 % Apply 1 application topically 2 (two) times daily. 11/06/17   [provider]  furosemide (LASIX) 80 MG tablet take 1 tablet BY MOUTH EVERY DAY for 30 days 08/22/16   [provider]  GUAIFENESIN PO Take 1 tablet by mouth daily as needed (CONGESTION).    [provider]  potassium chloride SA (K-DUR,KLOR-CON) 20 MEQ tablet Take 20 mEq by mouth 2 (two) times daily.    [provider]  predniSONE (DELTASONE) 20 MG tablet 3 tabs po day one, then 2 tabs  daily x 4 days Patient not taking: Reported on 03/21/2017 09/05/16   Domenic Moras, PA-C  spironolactone (ALDACTONE) 25 MG tablet Take 25 mg by mouth daily. 10/31/19   [provider]  SYMBICORT 80-4.5 MCG/ACT inhaler Inhale 2 puffs into the lungs 2 (two) times daily. 11/07/17   [provider]    Allergies    Lisinopril  Review of Systems   Review of Systems  Respiratory: Positive for shortness of breath.   All other systems reviewed and are negative.   Physical Exam Updated Vital Signs BP (!) 185/112   Pulse (!) 111   Temp 98.1 F (36.7 C) (Oral)   Resp 17   SpO2 96%   Physical Exam Vitals and nursing note reviewed.  HENT:     Head: Normocephalic.     Nose: Nose normal.     Mouth/Throat:     Mouth: Mucous membranes are moist.  Eyes:     Extraocular Movements: Extraocular movements intact.     Pupils: Pupils are equal, round, and reactive to light.  Cardiovascular:     Rate and Rhythm: Normal rate and regular rhythm.     Pulses: Normal pulses.     Heart sounds: Normal heart sounds.  Pulmonary:     Comments: Crackles bilateral bases Abdominal:     General: Abdomen is flat.     Palpations: Abdomen is soft.  Musculoskeletal:     Cervical back: Normal range of motion.     Comments: 1+ edema bilaterally  Skin:    General: Skin is warm.     Capillary Refill: Capillary refill takes less than 2 seconds.  Neurological:     General: No focal deficit present.     Mental Status: She is alert and oriented to person, place, and time.  Psychiatric:        Mood and Affect: Mood normal.        Behavior: Behavior normal.     ED Results / Procedures / Treatments   Labs (all labs ordered are listed, but only abnormal results are displayed) Labs Reviewed  CBC WITH DIFFERENTIAL/PLATELET - Abnormal; Notable for the following components:      Result Value   WBC 10.7 (*)    Hemoglobin 11.1 (*)    MCH 24.9 (*)    MCHC 29.3 (*)    RDW 17.2 (*)    Platelets 455  (*)    All other components within normal limits  I-STAT CHEM 8, ED -  Abnormal; Notable for the following components:   Creatinine, Ser 1.20 (*)    Glucose, Bld 209 (*)    TCO2 34 (*)    All other components within normal limits  RESPIRATORY PANEL BY RT PCR (FLU A&B, COVID)  COMPREHENSIVE METABOLIC PANEL  BRAIN NATRIURETIC PEPTIDE  TROPONIN I (HIGH SENSITIVITY)    EKG EKG Interpretation  Date/Time:  Monday August 27 2020 18:54:26 EDT Ventricular Rate:  115 PR Interval:    QRS Duration: 100 QT Interval:  347 QTC Calculation: 480 R Axis:   78 Text Interpretation: Sinus tachycardia Ventricular premature complex Aberrant conduction of SV complex(es) Since last tracing rate faster Confirmed by Wandra Arthurs [phone]) on 08/27/2020 6:57:59 PM   Radiology DG Chest Port 1 View  Result Date: 08/27/2020 CLINICAL DATA:  Shortness of breath, gained 10 pounds in 1 and half week. Congestive heart failure. Hypertension. EXAM: PORTABLE CHEST 1 VIEW.  Patient is rotated. COMPARISON:  Chest x-ray 07/11/2020, CT chest 02/11/2018, CT abdomen pelvis 07/12/2020 FINDINGS: Stable enlarged cardiac silhouette. Cannot exclude a retrocardiac opacity. No pulmonary edema. Possible trace left pleural effusion. No pneumothorax. No acute osseous abnormality. IMPRESSION: 1. Cardiomegaly with possible trace bilateral pleural effusions. Consider PA and lateral view. 2. Cannot exclude a retrocardiac opacity. Limited evaluation due to patient rotation. Consider PA and lateral view. Electronically Signed   By: Iven Finn M.D.   On: 08/27/2020 19:02    Procedures Procedures (including critical care time)  Medications Ordered in ED Medications - No data to display  ED Course  I have reviewed the triage vital signs and the nursing notes.  Pertinent labs & imaging results that were available during my care of the patient were reviewed by me and considered in my medical decision making (see chart for details).     MDM Rules/Calculators/A&P                         Kathryn Mahoney is a 59 y.o. female here presenting with shortness of breath.  Likely has CHF exacerbation.  Plan to get CBC, CMP, troponin, BNP, CXR.   10:09 PM BNP nl. CXR showed cardiomegaly. Trop neg x 2. Given lasix and diuresed well. Will increase torsemide to 40 mg daily. Will have her follow up with CHF clinic.      Final Clinical Impression(s) / ED Diagnoses Final diagnoses:  None    Rx / DC Orders ED Discharge Orders    None       Drenda Freeze, MD 08/27/20 2210

## 2020-08-27 NOTE — ED Triage Notes (Signed)
Sent by PCP for fluid retention, Pt states she has gained 10lbs in 1 1/2 weeks. Hx of CHF and hypertention.  Pt states she has had this before and needs to "get the fluid off of her", states she takes lasix at home but not working as well. Denies chest pain.

## 2020-09-03 ENCOUNTER — Emergency Department (HOSPITAL_COMMUNITY): Payer: Medicaid Other

## 2020-09-03 ENCOUNTER — Other Ambulatory Visit: Payer: Self-pay

## 2020-09-03 ENCOUNTER — Encounter (HOSPITAL_COMMUNITY): Payer: Self-pay | Admitting: Internal Medicine

## 2020-09-03 ENCOUNTER — Inpatient Hospital Stay (HOSPITAL_COMMUNITY)
Admission: EM | Admit: 2020-09-03 | Discharge: 2020-09-06 | DRG: 291 | Disposition: A | Discharge status: Home / Self Care | Payer: Medicaid Other | Attending: Internal Medicine | Admitting: Internal Medicine

## 2020-09-03 DIAGNOSIS — Z7984 Long term (current) use of oral hypoglycemic drugs: Secondary | ICD-10-CM

## 2020-09-03 DIAGNOSIS — I509 Heart failure, unspecified: Secondary | ICD-10-CM | POA: Diagnosis present

## 2020-09-03 DIAGNOSIS — I5033 Acute on chronic diastolic (congestive) heart failure: Secondary | ICD-10-CM | POA: Diagnosis present

## 2020-09-03 DIAGNOSIS — E1165 Type 2 diabetes mellitus with hyperglycemia: Secondary | ICD-10-CM | POA: Diagnosis present

## 2020-09-03 DIAGNOSIS — Z91041 Radiographic dye allergy status: Secondary | ICD-10-CM

## 2020-09-03 DIAGNOSIS — Z20822 Contact with and (suspected) exposure to covid-19: Secondary | ICD-10-CM | POA: Diagnosis present

## 2020-09-03 DIAGNOSIS — E119 Type 2 diabetes mellitus without complications: Secondary | ICD-10-CM | POA: Diagnosis not present

## 2020-09-03 DIAGNOSIS — R32 Unspecified urinary incontinence: Secondary | ICD-10-CM | POA: Diagnosis present

## 2020-09-03 DIAGNOSIS — M549 Dorsalgia, unspecified: Secondary | ICD-10-CM | POA: Diagnosis present

## 2020-09-03 DIAGNOSIS — D649 Anemia, unspecified: Secondary | ICD-10-CM | POA: Diagnosis present

## 2020-09-03 DIAGNOSIS — Z825 Family history of asthma and other chronic lower respiratory diseases: Secondary | ICD-10-CM | POA: Diagnosis not present

## 2020-09-03 DIAGNOSIS — J9621 Acute and chronic respiratory failure with hypoxia: Secondary | ICD-10-CM | POA: Diagnosis present

## 2020-09-03 DIAGNOSIS — Z79899 Other long term (current) drug therapy: Secondary | ICD-10-CM | POA: Diagnosis not present

## 2020-09-03 DIAGNOSIS — Z9989 Dependence on other enabling machines and devices: Secondary | ICD-10-CM | POA: Diagnosis not present

## 2020-09-03 DIAGNOSIS — Z87891 Personal history of nicotine dependence: Secondary | ICD-10-CM | POA: Diagnosis not present

## 2020-09-03 DIAGNOSIS — Z888 Allergy status to other drugs, medicaments and biological substances status: Secondary | ICD-10-CM | POA: Diagnosis not present

## 2020-09-03 DIAGNOSIS — G4733 Obstructive sleep apnea (adult) (pediatric): Secondary | ICD-10-CM | POA: Diagnosis present

## 2020-09-03 DIAGNOSIS — I10 Essential (primary) hypertension: Secondary | ICD-10-CM | POA: Diagnosis not present

## 2020-09-03 DIAGNOSIS — Z7951 Long term (current) use of inhaled steroids: Secondary | ICD-10-CM | POA: Diagnosis not present

## 2020-09-03 DIAGNOSIS — Z6841 Body Mass Index (BMI) 40.0 and over, adult: Secondary | ICD-10-CM | POA: Diagnosis not present

## 2020-09-03 DIAGNOSIS — I110 Hypertensive heart disease with heart failure: Secondary | ICD-10-CM | POA: Diagnosis present

## 2020-09-03 DIAGNOSIS — I272 Pulmonary hypertension, unspecified: Secondary | ICD-10-CM | POA: Diagnosis present

## 2020-09-03 DIAGNOSIS — R739 Hyperglycemia, unspecified: Secondary | ICD-10-CM

## 2020-09-03 DIAGNOSIS — Z7189 Other specified counseling: Secondary | ICD-10-CM | POA: Diagnosis not present

## 2020-09-03 DIAGNOSIS — J449 Chronic obstructive pulmonary disease, unspecified: Secondary | ICD-10-CM | POA: Diagnosis present

## 2020-09-03 LAB — BASIC METABOLIC PANEL
Anion gap: 8 (ref 5–15)
BUN: 15 mg/dL (ref 8–23)
CO2: 32 mmol/L (ref 22–32)
Calcium: 8.8 mg/dL — ABNORMAL LOW (ref 8.9–10.3)
Chloride: 100 mmol/L (ref 98–111)
Creatinine, Ser: 0.84 mg/dL (ref 0.44–1.00)
GFR, Estimated: >60 mL/min (ref >60)
Glucose, Bld: 136 mg/dL — ABNORMAL HIGH (ref 70–99)
Potassium: 3.6 mmol/L (ref 3.5–5.1)
Sodium: 140 mmol/L (ref 135–145)

## 2020-09-03 LAB — CBC
HCT: 37 % (ref 36.0–46.0)
Hemoglobin: 11 g/dL — ABNORMAL LOW (ref 12.0–15.0)
MCH: 25.2 pg — ABNORMAL LOW (ref 26.0–34.0)
MCHC: 29.7 g/dL — ABNORMAL LOW (ref 30.0–36.0)
MCV: 84.9 fL (ref 80.0–100.0)
Platelets: 406 10*3/uL — ABNORMAL HIGH (ref 150–400)
RBC: 4.36 MIL/uL (ref 3.87–5.11)
RDW: 16.8 % — ABNORMAL HIGH (ref 11.5–15.5)
WBC: 9.2 10*3/uL (ref 4.0–10.5)
nRBC: 0 % (ref 0.0–0.2)

## 2020-09-03 LAB — D-DIMER, QUANTITATIVE: D-Dimer, Quant: <0.27 ug/mL-FEU (ref 0.00–0.50)

## 2020-09-03 LAB — GLUCOSE, CAPILLARY
Glucose-Capillary: 101 mg/dL — ABNORMAL HIGH (ref 70–99)
Glucose-Capillary: 171 mg/dL — ABNORMAL HIGH (ref 70–99)

## 2020-09-03 LAB — BRAIN NATRIURETIC PEPTIDE: B Natriuretic Peptide: 49.4 pg/mL (ref 0.0–100.0)

## 2020-09-03 LAB — TROPONIN I (HIGH SENSITIVITY)
Troponin I (High Sensitivity): 9 ng/L (ref <18)
Troponin I (High Sensitivity): 10 ng/L (ref <18)

## 2020-09-03 LAB — RESPIRATORY PANEL BY RT PCR (FLU A&B, COVID)
Influenza A by PCR: NEGATIVE
Influenza B by PCR: NEGATIVE
SARS Coronavirus 2 by RT PCR: NEGATIVE

## 2020-09-03 LAB — HEMOGLOBIN A1C
Hgb A1c MFr Bld: 9.1 % — ABNORMAL HIGH (ref 4.8–5.6)
Mean Plasma Glucose: 214.47 mg/dL

## 2020-09-03 MED ORDER — SPIRONOLACTONE 25 MG PO TABS
25.0000 mg | ORAL_TABLET | Freq: Every day | ORAL | Status: DC
  Administered 2020-09-03 – 2020-09-06 (×4): 25 mg via ORAL
  Filled 2020-09-03 (×4): qty 1

## 2020-09-03 MED ORDER — VITAMIN D 25 MCG (1000 UNIT) PO TABS
1000.0000 [IU] | ORAL_TABLET | Freq: Every day | ORAL | Status: DC
  Administered 2020-09-03 – 2020-09-06 (×4): 1000 [IU] via ORAL
  Filled 2020-09-03 (×4): qty 1

## 2020-09-03 MED ORDER — NITROGLYCERIN 2 % TD OINT
1.0000 [in_us] | TOPICAL_OINTMENT | Freq: Four times a day (QID) | TRANSDERMAL | Status: DC
  Administered 2020-09-03 – 2020-09-06 (×12): 1 [in_us] via TOPICAL
  Filled 2020-09-03 (×2): qty 30

## 2020-09-03 MED ORDER — GUAIFENESIN-DM 100-10 MG/5ML PO SYRP
5.0000 mL | ORAL_SOLUTION | ORAL | Status: DC | PRN
  Administered 2020-09-04: 5 mL via ORAL
  Filled 2020-09-03: qty 10

## 2020-09-03 MED ORDER — ALBUTEROL SULFATE HFA 108 (90 BASE) MCG/ACT IN AERS: 2.0000 | INHALATION_SPRAY | Freq: Four times a day (QID) | RESPIRATORY_TRACT | Status: DC | PRN

## 2020-09-03 MED ORDER — FUROSEMIDE 10 MG/ML IJ SOLN
60.0000 mg | Freq: Once | INTRAMUSCULAR | Status: AC
  Administered 2020-09-03: 60 mg via INTRAVENOUS
  Filled 2020-09-03: qty 8

## 2020-09-03 MED ORDER — FUROSEMIDE 10 MG/ML IJ SOLN
40.0000 mg | Freq: Two times a day (BID) | INTRAMUSCULAR | Status: DC
  Administered 2020-09-03 – 2020-09-05 (×4): 40 mg via INTRAVENOUS
  Filled 2020-09-03 (×4): qty 4

## 2020-09-03 MED ORDER — ACETAMINOPHEN 650 MG RE SUPP: 650.0000 mg | Freq: Four times a day (QID) | RECTAL | Status: DC | PRN

## 2020-09-03 MED ORDER — ACETAMINOPHEN 325 MG PO TABS
650.0000 mg | ORAL_TABLET | Freq: Four times a day (QID) | ORAL | Status: DC | PRN
  Administered 2020-09-03 – 2020-09-06 (×7): 650 mg via ORAL
  Filled 2020-09-03 (×7): qty 2

## 2020-09-03 MED ORDER — POTASSIUM CHLORIDE CRYS ER 20 MEQ PO TBCR
40.0000 meq | EXTENDED_RELEASE_TABLET | Freq: Once | ORAL | Status: AC
  Administered 2020-09-03: 40 meq via ORAL
  Filled 2020-09-03: qty 2

## 2020-09-03 MED ORDER — ENOXAPARIN SODIUM 80 MG/0.8ML ~~LOC~~ SOLN
70.0000 mg | SUBCUTANEOUS | Status: DC
  Administered 2020-09-03 – 2020-09-05 (×3): 70 mg via SUBCUTANEOUS
  Filled 2020-09-03 (×3): qty 0.8

## 2020-09-03 MED ORDER — INSULIN ASPART 100 UNIT/ML ~~LOC~~ SOLN
0.0000 [IU] | Freq: Three times a day (TID) | SUBCUTANEOUS | Status: DC
  Administered 2020-09-04: 3 [IU] via SUBCUTANEOUS
  Administered 2020-09-04: 5 [IU] via SUBCUTANEOUS
  Administered 2020-09-05: 3 [IU] via SUBCUTANEOUS
  Administered 2020-09-05: 2 [IU] via SUBCUTANEOUS
  Administered 2020-09-05 – 2020-09-06 (×2): 3 [IU] via SUBCUTANEOUS
  Administered 2020-09-06: 8 [IU] via SUBCUTANEOUS

## 2020-09-03 MED ORDER — FLUTICASONE FUROATE-VILANTEROL 100-25 MCG/INH IN AEPB
1.0000 | INHALATION_SPRAY | Freq: Every day | RESPIRATORY_TRACT | Status: DC
  Administered 2020-09-04 – 2020-09-06 (×3): 1 via RESPIRATORY_TRACT
  Filled 2020-09-03: qty 28

## 2020-09-03 MED ORDER — INSULIN ASPART 100 UNIT/ML ~~LOC~~ SOLN: 0.0000 [IU] | Freq: Every day | SUBCUTANEOUS | Status: DC

## 2020-09-03 MED ORDER — CROMOLYN SODIUM 4 % OP SOLN
1.0000 [drp] | Freq: Four times a day (QID) | OPHTHALMIC | Status: DC
  Administered 2020-09-03 – 2020-09-06 (×10): 1 [drp] via OPHTHALMIC
  Filled 2020-09-03: qty 10

## 2020-09-03 MED ORDER — PROMETHAZINE HCL 25 MG PO TABS: 12.5000 mg | ORAL_TABLET | Freq: Four times a day (QID) | ORAL | Status: DC | PRN

## 2020-09-03 NOTE — H&P (Signed)
History and Physical    Kathryn Mahoney ZJQ:[id number] DOB: Feb 04, 1961 DOA: 09/03/2020  PCP: Benito Mccreedy, MD  Patient coming from: Home  Chief Complaint: swelling and shortness of breathi.  HPI: Kathryn Mahoney is a 59 y.o. female with medical history significant of COPD, HFpEF, morbid obesity, DM2. Presenting with 1 week of increasing edema. She reports that she went to her PCP about this issue 5 days ago, and she was told to go to the ED. She presented to the ED, found to be in HF exacerbation and given lasix. This improved her situation and she was sent home. She states since that time, the fluid has come back and now she feels as if she can not breathe properly. She feels worse when she is trying to move. She decided that she needed to come back to the ED today.    ED Course: CXR showed pulm edema and vascular congestion. She was given 60mg  lasix IV with some improvement in dyspnea. TRH was called for admission.   Review of Systems:  Denies CP, palpitations, syncopal episode, HA, N/V/D, ab pain. Review of systems is otherwise negative for all not mentioned in HPI.   PMHx Past Medical History:  Diagnosis Date  . Asthma   . B12 deficiency   . CHF (congestive heart failure) ([place])   . COPD (chronic obstructive pulmonary disease) ([place])   . Diabetes mellitus without complication ([place])   . Hypertension   . Morbid obesity ([place])   . SOB (shortness of breath)   . Urinary incontinence     PSHx Past Surgical History:  Procedure Laterality Date  . CESAREAN SECTION    . ECTOPIC PREGNANCY SURGERY    . KNEE SURGERY      SocHx  reports that she quit smoking about 9 years ago. Her smoking use included cigarettes. She has a 45.00 pack-year smoking history. She has never used smokeless tobacco. She reports that she does not drink alcohol and does not use drugs.  Allergies  Allergen Reactions  . Contrast Media [Iodinated Diagnostic Agents] Other (See Comments)    Coughing, sick to  stomach, increase blood pressure   . Empagliflozin Other (See Comments)    Yeast infection  . Losartan Other (See Comments)    Back pain  . Lisinopril Nausea And Vomiting  . Metformin Palpitations  . Ondansetron Other (See Comments) and Swelling    FamHx Family History  Problem Relation Age of Onset  . Stroke Mother   . Asthma Mother     Prior to Admission medications   Medication Sig Start Date End Date Taking? Authorizing Provider  acetaminophen (TYLENOL) 650 MG CR tablet Take 650 mg by mouth every 8 (eight) hours as needed for pain.   Yes [provider]  albuterol (PROVENTIL) (2.5 MG/3ML) 0.083% nebulizer solution Take 3 mLs (2.5 mg total) by nebulization every 6 (six) hours as needed for wheezing or shortness of breath. 03/21/17  Yes Robinson, [place] N, PA-C  albuterol (VENTOLIN HFA) 108 (90 Base) MCG/ACT inhaler Inhale 2 puffs into the lungs every 6 (six) hours as needed for wheezing or shortness of breath. 11/05/13  Yes [provider]  cholecalciferol (VITAMIN D3) 25 MCG (1000 UNIT) tablet Take 1,000 Units by mouth daily.   Yes [provider]  cromolyn (OPTICROM) 4 % ophthalmic solution Place 1 drop into both eyes in the morning, at noon, in the evening, and at bedtime.   Yes [provider]  glipiZIDE (GLUCOTROL XL) 5 MG 24 hr  tablet Take 5 mg by mouth every evening.  08/24/20  Yes [provider]  GUAIFENESIN PO Take 1 tablet by mouth daily as needed (CONGESTION).   Yes [provider]  hydrocortisone 2.5 % cream Apply 1 application topically 3 (three) times daily as needed for itching.   Yes [provider]  OXYGEN Inhale 2 L into the lungs as needed (shortness of breath).   Yes [provider]  spironolactone (ALDACTONE) 25 MG tablet Take 25 mg by mouth daily. 10/31/19  Yes [provider]  SYMBICORT 80-4.5 MCG/ACT inhaler Inhale 2 puffs into the lungs 2 (two) times daily. 11/07/17  Yes [provider]  torsemide (DEMADEX) 20 MG tablet Take 2 tablets (40 mg total) by mouth daily. 08/27/20  Yes Drenda Freeze, MD  predniSONE (DELTASONE) 20 MG tablet 3 tabs po day one, then 2 tabs daily x 4 days Patient not taking: Reported on 03/21/2017 09/05/16   Domenic Moras, PA-C    Physical Exam: Vitals:   09/03/20 1342 09/03/20 1400 09/03/20 1500 09/03/20 1515  BP: (!) 151/85 (!) 167/109  (!) 161/96  Pulse: 97 99 99 (!) 105  Resp: (!) 22 (!) 21 (!) 24 20  Temp:      TempSrc:      SpO2: 98% 99% 95% 94%  Weight:      Height:        General: 59 y.o. female resting in bed in NAD Eyes: PERRL, normal sclera ENMT: Nares patent w/o discharge, orophaynx clear, dentition normal, ears w/o discharge/lesions/ulcers Neck: Supple, trachea midline Cardiovascular: RRR, +S1, S2, no m/g/r, equal pulses throughout Respiratory: decreased at bases w/ some soft crackels, no w/r, slightly increased WOB GI: BS+, obese, NDNT, no masses noted, no organomegaly noted MSK: No e/c/c Skin: No rashes, bruises, ulcerations noted Neuro: A&O x 3, no focal deficits Psyc: Appropriate interaction and affect, calm/cooperative  Labs on Admission: I have personally reviewed following labs and imaging studies  CBC: Recent Labs  Lab 08/27/20 1854 08/27/20 1903 09/03/20 1309  WBC 10.7*  --  9.2  NEUTROABS 7.0  --   --   HGB 11.1* 12.6 11.0*  HCT 37.9 37.0 37.0  MCV 85.0  --  84.9  PLT 455*  --  710*   Basic Metabolic Panel: Recent Labs  Lab 08/27/20 1854 08/27/20 1903 09/03/20 1309  NA 141 142 140  K 4.2 4.4 3.6  CL 100 100 100  CO2 30  --  32  GLUCOSE 207* 209* 136*  BUN 17 20 15   CREATININE 1.09* 1.20* 0.84  CALCIUM 9.4  --  8.8*   GFR: Estimated Creatinine Clearance: 106.6 mL/min (by C-G formula based on SCr of 0.84 mg/dL). Liver Function Tests: Recent Labs  Lab 08/27/20 1854  AST 21  ALT 22  ALKPHOS 98  BILITOT 0.3  PROT 8.0  ALBUMIN 3.6   No results for input(s): LIPASE,  AMYLASE in the last 168 hours. No results for input(s): AMMONIA in the last 168 hours. Coagulation Profile: No results for input(s): INR, PROTIME in the last 168 hours. Cardiac Enzymes: No results for input(s): CKTOTAL, CKMB, CKMBINDEX, TROPONINI in the last 168 hours. BNP (last 3 results) No results for input(s): PROBNP in the last 8760 hours. HbA1C: No results for input(s): HGBA1C in the last 72 hours. CBG: No results for input(s): GLUCAP in the last 168 hours. Lipid Profile: No results for input(s): CHOL, HDL, LDLCALC, TRIG, CHOLHDL, LDLDIRECT in the last 72 hours. Thyroid Function Tests:  No results for input(s): TSH, T4TOTAL, FREET4, T3FREE, THYROIDAB in the last 72 hours. Anemia Panel: No results for input(s): VITAMINB12, FOLATE, FERRITIN, TIBC, IRON, RETICCTPCT in the last 72 hours. Urine analysis:    Component Value Date/Time   COLORURINE YELLOW 11/20/2017 1720   APPEARANCEUR HAZY (A) 11/20/2017 1720   LABSPEC >=1.030 01/16/2020 1152   PHURINE 6.0 01/16/2020 1152   GLUCOSEU NEGATIVE 01/16/2020 1152   HGBUR TRACE (A) 01/16/2020 1152   BILIRUBINUR NEGATIVE 01/16/2020 1152   KETONESUR NEGATIVE 01/16/2020 1152   PROTEINUR NEGATIVE 01/16/2020 1152   UROBILINOGEN 0.2 01/16/2020 1152   NITRITE NEGATIVE 01/16/2020 1152   LEUKOCYTESUR NEGATIVE 01/16/2020 1152    Radiological Exams on Admission: DG Chest Portable 1 View  Result Date: 09/03/2020 CLINICAL DATA:  Dyspnea. Shortness of breath. Difficulty breathing. EXAM: PORTABLE CHEST 1 VIEW COMPARISON:  One-view chest x-ray 08/27/2020. FINDINGS: Heart is enlarged. Increasing pulmonary vascular congestion edema is present. Mild atelectasis is present bases. IMPRESSION: Cardiomegaly with increasing pulmonary vascular congestion and edema compatible with congestive heart failure. Electronically Signed   By: San Morelle M.D.   On: 09/03/2020 13:56    EKG: Independently reviewed. NSR, no st changes  Assessment/Plan Acute on  chronic diastolic HF     - admit to inpt, telemetry     - got lasix in ED; continue IV lasix 40mg  BID     - check echo     - fluid restriction, I&O, daily weights     - denies chest pain     - unable to take ACEi/ARB d/t back pain and unable to take coreg d/t intolerance per cardiology notes in care everywhere  DM2     - SSI, DM diet, glucose checks, A1c  HTN     - spironolactone, lasix  COPD     - breo ellipta, PRN inhalers, Nebs     - O2 as needed  Morbid obesity     - counseled on lifestyle changes     - follow up with outpt bariatrics  DVT prophylaxis: lovenox  Code Status: FULL  Family Communication: None at bedside  Consults called: None   Status is: Inpatient  Remains inpatient appropriate because:Inpatient level of care appropriate due to severity of illness   Dispo: The patient is from: Home              Anticipated d/c is to: Home              Anticipated d/c date is: 2 days              Patient currently is not medically stable to d/c.  Jonnie Finner DO [facility]  If 7PM-7AM, please contact night-coverage [email or web address]  09/03/2020, 3:25 PM

## 2020-09-03 NOTE — ED Provider Notes (Signed)
[facility] DEPT Provider Note   CSN: [id number] Arrival date & time: 09/03/20  1111     History Chief Complaint  Patient presents with  . Congestive Heart Failure    Kathryn Mahoney is a 59 y.o. female.  HPI    Patient presents to the ED with complaints of fluid retention and dyspnea on exertion.  Patient has a history of congestive heart failure as well as morbid obesity.  Patient was seen in the emergency room on November 1.  Patient had an ED work-up that included chest x-ray that showed cardiomegaly.  She had a BMP that was normal.  Patient was given a dose of Lasix and was told to increase her torsemide and follow-up in the CHF clinic.  Patient states she has complied with the torsemide dosing.  She has not seen a cardiologist.  She tries to watch her diet but is not necessarily aware of the salt and certain foods.  Patient states she feels like she is retaining fluid again and feels like she needs to get it off.  She gets short of breath with activity.  Patient states she spoke to her doctor instructed her to come to the ED.  Medical records show that she contacted her primary care doctor on November 2 and they have refilled her torsemide.  Past Medical History:  Diagnosis Date  . Asthma   . B12 deficiency   . CHF (congestive heart failure) ([place])   . COPD (chronic obstructive pulmonary disease) ([place])   . Diabetes mellitus without complication ([place])   . Hypertension   . Morbid obesity ([place])   . SOB (shortness of breath)   . Urinary incontinence     Patient Active Problem List   Diagnosis Date Noted  . CHF exacerbation ([place]) 09/03/2020  . Type II or unspecified type diabetes mellitus without mention of complication, not stated as uncontrolled 11/07/2013  . Hyperglycemia 11/06/2013  . COPD exacerbation ([place]) 11/02/2013  . PNA (pneumonia) 11/02/2013  . Hypoxia 11/02/2013  . Acute-on-chronic respiratory failure ([place]) 11/02/2013  . COPD  (chronic obstructive pulmonary disease) ([place])   . CHF (congestive heart failure) ([place])   . Urinary incontinence   . Hypertension     Past Surgical History:  Procedure Laterality Date  . CESAREAN SECTION    . ECTOPIC PREGNANCY SURGERY    . KNEE SURGERY       OB History    Gravida  5   Para  4   Term  4   Preterm      AB  1   Living  4     SAB      TAB      Ectopic  1   Multiple      Live Births              Family History  Problem Relation Age of Onset  . Stroke Mother   . Asthma Mother     Social History   Tobacco Use  . Smoking status: Former Smoker    Packs/day: 1.50    Years: 30.00    Pack years: 45.00    Types: Cigarettes    Quit date: 10/27/2010    Years since quitting: 9.8  . Smokeless tobacco: Never Used  Substance Use Topics  . Alcohol use: No  . Drug use: No    Home Medications Prior to Admission medications   Medication Sig Start Date End Date Taking? Authorizing Provider  acetaminophen (TYLENOL) 650  MG CR tablet Take 650 mg by mouth every 8 (eight) hours as needed for pain.   Yes [provider]  albuterol (PROVENTIL) (2.5 MG/3ML) 0.083% nebulizer solution Take 3 mLs (2.5 mg total) by nebulization every 6 (six) hours as needed for wheezing or shortness of breath. 03/21/17  Yes Robinson, [place] N, PA-C  albuterol (VENTOLIN HFA) 108 (90 Base) MCG/ACT inhaler Inhale 2 puffs into the lungs every 6 (six) hours as needed for wheezing or shortness of breath. 11/05/13  Yes [provider]  cholecalciferol (VITAMIN D3) 25 MCG (1000 UNIT) tablet Take 1,000 Units by mouth daily.   Yes [provider]  glipiZIDE (GLUCOTROL XL) 5 MG 24 hr tablet Take 5 mg by mouth every morning. 08/24/20  Yes [provider]  GUAIFENESIN PO Take 1 tablet by mouth daily as needed (CONGESTION).   Yes [provider]  OXYGEN Inhale 2 L into the lungs as needed (shortness of breath).   Yes [provider]   spironolactone (ALDACTONE) 25 MG tablet Take 25 mg by mouth daily. 10/31/19  Yes [provider]  SYMBICORT 80-4.5 MCG/ACT inhaler Inhale 2 puffs into the lungs 2 (two) times daily. 11/07/17  Yes [provider]  torsemide (DEMADEX) 20 MG tablet Take 2 tablets (40 mg total) by mouth daily. 08/27/20  Yes Drenda Freeze, MD  cromolyn (OPTICROM) 4 % ophthalmic solution Place 1 drop into both eyes in the morning, at noon, in the evening, and at bedtime.    [provider]  Ferrous Sulfate (IRON PO) Take 1 tablet by mouth daily. Patient not taking: Reported on 09/03/2020    [provider]  hydrocortisone 2.5 % cream Apply 1 application topically 3 (three) times daily as needed for itching.    [provider]  predniSONE (DELTASONE) 20 MG tablet 3 tabs po day one, then 2 tabs daily x 4 days Patient not taking: Reported on 03/21/2017 09/05/16   Domenic Moras, PA-C  torsemide (DEMADEX) 20 MG tablet Take 20 mg by mouth daily. 08/08/20   [provider]    Allergies    Contrast media [iodinated diagnostic agents], Empagliflozin, Losartan, Lisinopril, Metformin, and Ondansetron  Review of Systems   Review of Systems  Physical Exam Updated Vital Signs BP (!) 167/109   Pulse 99   Temp 98.7 F (37.1 C) (Oral)   Resp (!) 21   Ht 1.753 m (5\' 9" )   Wt (!) 140.6 kg   SpO2 99%   BMI 45.78 kg/m   Physical Exam Vitals and nursing note reviewed.  Constitutional:      General: She is not in acute distress.    Appearance: She is well-developed.  HENT:     Head: Normocephalic and atraumatic.     Right Ear: External ear normal.     Left Ear: External ear normal.  Eyes:     General: No scleral icterus.       Right eye: No discharge.        Left eye: No discharge.     Conjunctiva/sclera: Conjunctivae normal.  Neck:     Trachea: No tracheal deviation.  Cardiovascular:     Rate and Rhythm: Normal rate and regular rhythm.  Pulmonary:     Effort:  Pulmonary effort is normal. No respiratory distress.     Breath sounds: Normal breath sounds. No stridor. No wheezing or rales.  Abdominal:     General: Bowel sounds are normal. There is no distension.  Palpations: Abdomen is soft.     Tenderness: There is no abdominal tenderness. There is no guarding or rebound.  Musculoskeletal:        General: No swelling or tenderness.     Cervical back: Neck supple.     Right lower leg: No edema.     Left lower leg: No edema.  Skin:    General: Skin is warm and dry.     Findings: No rash.  Neurological:     Mental Status: She is alert.     Cranial Nerves: No cranial nerve deficit (no facial droop, extraocular movements intact, no slurred speech).     Sensory: No sensory deficit.     Motor: No abnormal muscle tone or seizure activity.     Coordination: Coordination normal.     ED Results / Procedures / Treatments   Labs (all labs ordered are listed, but only abnormal results are displayed) Labs Reviewed  BASIC METABOLIC PANEL - Abnormal; Notable for the following components:      Result Value   Glucose, Bld 136 (*)    Calcium 8.8 (*)    All other components within normal limits  CBC - Abnormal; Notable for the following components:   Hemoglobin 11.0 (*)    MCH 25.2 (*)    MCHC 29.7 (*)    RDW 16.8 (*)    Platelets 406 (*)    All other components within normal limits  RESPIRATORY PANEL BY RT PCR (FLU A&B, COVID)  BRAIN NATRIURETIC PEPTIDE  D-DIMER, QUANTITATIVE (NOT AT [facility])  TROPONIN I (HIGH SENSITIVITY)  TROPONIN I (HIGH SENSITIVITY)    EKG EKG Interpretation  Date/Time:  Monday September 03 2020 15:00:55 EST Ventricular Rate:  97 PR Interval:    QRS Duration: 106 QT Interval:  373 QTC Calculation: 474 R Axis:   70 Text Interpretation: Sinus rhythm Right atrial enlargement No significant change since last tracing Confirmed by Dorie Rank [phone]) on 09/03/2020 3:03:14 PM   Radiology DG Chest Portable 1 View  Result  Date: 09/03/2020 CLINICAL DATA:  Dyspnea. Shortness of breath. Difficulty breathing. EXAM: PORTABLE CHEST 1 VIEW COMPARISON:  One-view chest x-ray 08/27/2020. FINDINGS: Heart is enlarged. Increasing pulmonary vascular congestion edema is present. Mild atelectasis is present bases. IMPRESSION: Cardiomegaly with increasing pulmonary vascular congestion and edema compatible with congestive heart failure. Electronically Signed   By: San Morelle M.D.   On: 09/03/2020 13:56    Procedures Procedures (including critical care time)  Medications Ordered in ED Medications  potassium chloride SA (KLOR-CON) CR tablet 40 mEq (has no administration in time range)  furosemide (LASIX) injection 60 mg (60 mg Intravenous Given 09/03/20 1341)    ED Course  I have reviewed the triage vital signs and the nursing notes.  Pertinent labs & imaging results that were available during my care of the patient were reviewed by me and considered in my medical decision making (see chart for details).  Clinical Course as of Sep 03 1502  Mon Sep 03, 2020  1427 Patient's BNP is not elevated.  Troponin is normal.  D-dimer is negative.   [ZH]  0865 Chest x-ray does show increasing findings of cardiaomegaly and vascular congestion   [JK]    Clinical Course User Index [JK] Dorie Rank, MD   MDM Rules/Calculators/A&P                          Patient presented to ED for evaluation of worsening shortness of  breath and fluid retention.  Patient has a history of CHF.  No appreciable peripheral edema noted but chest x-ray does show increasing vascular congestion.  Patient has already tried ED treatment with diuretics and increased dosing of her outpatient medications.  Patient is having worsening shortness of breath and evidence of CHF. Patient has been given IV diuretics and dose of potassium. Considering her worsening symptoms with attempts at outpatient management we will plan on admission for further treatment. Final  Clinical Impression(s) / ED Diagnoses Final diagnoses:  Acute on chronic congestive heart failure, unspecified heart failure type [facility])      Dorie Rank, MD 09/03/20 1504

## 2020-09-03 NOTE — ED Notes (Signed)
One unsuccessful attempt to start an IV and draw blood.

## 2020-09-03 NOTE — ED Triage Notes (Signed)
Patient reports she was seen a week ago for fluid retention and that she has continued to have fluid retention and it is making her breathing worse. Patient reports her PCP told her to go back to the hospital for possible admission and overnight get multiple doses of IV lasix.

## 2020-09-03 NOTE — Plan of Care (Signed)

## 2020-09-04 ENCOUNTER — Inpatient Hospital Stay (HOSPITAL_COMMUNITY): Payer: Medicaid Other

## 2020-09-04 DIAGNOSIS — J449 Chronic obstructive pulmonary disease, unspecified: Secondary | ICD-10-CM

## 2020-09-04 DIAGNOSIS — G4733 Obstructive sleep apnea (adult) (pediatric): Secondary | ICD-10-CM

## 2020-09-04 DIAGNOSIS — Z9989 Dependence on other enabling machines and devices: Secondary | ICD-10-CM

## 2020-09-04 DIAGNOSIS — I5033 Acute on chronic diastolic (congestive) heart failure: Secondary | ICD-10-CM

## 2020-09-04 DIAGNOSIS — I10 Essential (primary) hypertension: Secondary | ICD-10-CM

## 2020-09-04 DIAGNOSIS — E1165 Type 2 diabetes mellitus with hyperglycemia: Secondary | ICD-10-CM

## 2020-09-04 DIAGNOSIS — I272 Pulmonary hypertension, unspecified: Secondary | ICD-10-CM

## 2020-09-04 LAB — CBC
HCT: 35.9 % — ABNORMAL LOW (ref 36.0–46.0)
Hemoglobin: 10.6 g/dL — ABNORMAL LOW (ref 12.0–15.0)
MCH: 25 pg — ABNORMAL LOW (ref 26.0–34.0)
MCHC: 29.5 g/dL — ABNORMAL LOW (ref 30.0–36.0)
MCV: 84.7 fL (ref 80.0–100.0)
Platelets: 353 10*3/uL (ref 150–400)
RBC: 4.24 MIL/uL (ref 3.87–5.11)
RDW: 17.1 % — ABNORMAL HIGH (ref 11.5–15.5)
WBC: 9.2 10*3/uL (ref 4.0–10.5)
nRBC: 0 % (ref 0.0–0.2)

## 2020-09-04 LAB — GLUCOSE, CAPILLARY
Glucose-Capillary: 171 mg/dL — ABNORMAL HIGH (ref 70–99)
Glucose-Capillary: 203 mg/dL — ABNORMAL HIGH (ref 70–99)
Glucose-Capillary: 138 mg/dL — ABNORMAL HIGH (ref 70–99)

## 2020-09-04 LAB — ECHOCARDIOGRAM COMPLETE
Area-P 1/2: 5.27 cm2
Height: 69 in
MV M vel: 5.97 m/s
MV Peak grad: 142.6 mmHg
Radius: 0.5 cm
S' Lateral: 3.7 cm
Weight: 5140.8 oz

## 2020-09-04 LAB — COMPREHENSIVE METABOLIC PANEL
ALT: 22 U/L (ref 0–44)
AST: 18 U/L (ref 15–41)
Albumin: 3.2 g/dL — ABNORMAL LOW (ref 3.5–5.0)
Alkaline Phosphatase: 82 U/L (ref 38–126)
Anion gap: 9 (ref 5–15)
BUN: 18 mg/dL (ref 8–23)
CO2: 31 mmol/L (ref 22–32)
Calcium: 9 mg/dL (ref 8.9–10.3)
Chloride: 97 mmol/L — ABNORMAL LOW (ref 98–111)
Creatinine, Ser: 0.96 mg/dL (ref 0.44–1.00)
GFR, Estimated: >60 mL/min (ref >60)
Glucose, Bld: 184 mg/dL — ABNORMAL HIGH (ref 70–99)
Potassium: 3.8 mmol/L (ref 3.5–5.1)
Sodium: 137 mmol/L (ref 135–145)
Total Bilirubin: 0.5 mg/dL (ref 0.3–1.2)
Total Protein: 6.9 g/dL (ref 6.5–8.1)

## 2020-09-04 LAB — HIV ANTIBODY (ROUTINE TESTING W REFLEX): HIV Screen 4th Generation wRfx: NONREACTIVE

## 2020-09-04 MED ORDER — TRAMADOL HCL 50 MG PO TABS
50.0000 mg | ORAL_TABLET | Freq: Two times a day (BID) | ORAL | Status: AC | PRN
  Administered 2020-09-04: 50 mg via ORAL
  Filled 2020-09-04: qty 1

## 2020-09-04 MED ORDER — LIDOCAINE 5 % EX PTCH: 1.0000 | MEDICATED_PATCH | CUTANEOUS | Status: DC | PRN

## 2020-09-04 MED ORDER — LIDOCAINE 5 % EX PTCH
1.0000 | MEDICATED_PATCH | CUTANEOUS | Status: DC
  Administered 2020-09-04 – 2020-09-05 (×2): 1 via TRANSDERMAL
  Filled 2020-09-04 (×2): qty 1

## 2020-09-04 NOTE — Progress Notes (Signed)
SATURATION QUALIFICATIONS: (This note is used to comply with regulatory documentation for home oxygen)  Patient Saturations on Room Air at Rest = 93%  Patient Saturations on Room Air while Ambulating = 85%  Patient Saturations on -- Liters of oxygen while Ambulating = TBD   Please briefly explain why patient needs home oxygen: to maintain appropriate oxygen saturation levels with activity.   Blondell Reveal Kistler PT 09/04/2020  Acute Rehabilitation Services Pager [phone] Office [phone]

## 2020-09-04 NOTE — Evaluation (Addendum)
Physical Therapy Evaluation Patient Details Name: Kathryn Mahoney MRN: [id number] DOB: 18-Jun-1961 Today's Date: 09/04/2020   History of Present Illness  59 year old female with history of COPD, chronic diastolic heart failure, morbid obesity, diabetes mellitus type 2 presented with worsening shortness of breath and swelling. On presentation, chest x-ray showed pulmonary edema and vascular congestion. Dx of acute on chronic CHF.  Clinical Impression  Pt admitted with above diagnosis. Pt ambulated 160' without an assistive device, no loss of balance, SaO2 85% on room air walking, 93% on room air at rest. Pt reports she uses 2L O2 at home as needed. Instructed pt in LE strengthening exercises and encouraged her to ambulate in the room several times a day to minimize deconditioning. Instructed to use O2 for walking at present, will need to continue to assess walking SaO2 levels during hospitalization. Pt currently with functional limitations due to the deficits listed below (see PT Problem List). Pt will benefit from skilled PT to increase their independence and safety with mobility to allow discharge to the venue listed below.       Follow Up Recommendations No PT follow up    Equipment Recommendations  None recommended by PT    Recommendations for Other Services       Precautions / Restrictions Precautions Precautions: Other (comment) Precaution Comments: monitor O2 Restrictions Weight Bearing Restrictions: No      Mobility  Bed Mobility               General bed mobility comments: up in recliner    Transfers Overall transfer level: Independent                  Ambulation/Gait Ambulation/Gait assistance: Independent Gait Distance (Feet): 180 Feet Assistive device: None Gait Pattern/deviations: WFL(Within Functional Limits) Gait velocity: WFL   General Gait Details: steady, no loss of balance, SaO2 85% on room air walking, 93% on room air at rest, 2/4  dyspnea  Stairs            Wheelchair Mobility    Modified Rankin (Stroke Patients Only)       Balance Overall balance assessment: Independent                                           Pertinent Vitals/Pain Pain Assessment: No/denies pain    Home Living Family/patient expects to be discharged to:: Private residence Living Arrangements: Children Available Help at Discharge: Family;Available 24 hours/day   Home Access: Level entry     Home Layout: One level Home Equipment: None Additional Comments: home O2, uses 2L prn    Prior Function Level of Independence: Independent               Hand Dominance        Extremity/Trunk Assessment   Upper Extremity Assessment Upper Extremity Assessment: Overall WFL for tasks assessed    Lower Extremity Assessment Lower Extremity Assessment: Overall WFL for tasks assessed (pitting edema noted BLEs)    Cervical / Trunk Assessment Cervical / Trunk Assessment: Normal  Communication   Communication: No difficulties  Cognition Arousal/Alertness: Awake/alert Behavior During Therapy: WFL for tasks assessed/performed Overall Cognitive Status: Within Functional Limits for tasks assessed  General Comments      Exercises General Exercises - Lower Extremity Ankle Circles/Pumps: AROM;Both;10 reps;Seated Long Arc Quad: AROM;Both;10 reps;Seated Hip Flexion/Marching: AROM;Both;5 reps;Seated   Assessment/Plan    PT Assessment Patient needs continued PT services  PT Problem List Decreased activity tolerance       PT Treatment Interventions Gait training;Therapeutic exercise    PT Goals (Current goals can be found in the Care Plan section)  Acute Rehab PT Goals Patient Stated Goal: to be less short of breath, be able to exercise PT Goal Formulation: With patient Time For Goal Achievement: 09/18/20 Potential to Achieve Goals: Good     Frequency Min 3X/week   Barriers to discharge        Co-evaluation               AM-PAC PT "6 Clicks" Mobility  Outcome Measure Help needed turning from your back to your side while in a flat bed without using bedrails?: None Help needed moving from lying on your back to sitting on the side of a flat bed without using bedrails?: None Help needed moving to and from a bed to a chair (including a wheelchair)?: None Help needed standing up from a chair using your arms (e.g., wheelchair or bedside chair)?: None Help needed to walk in hospital room?: None Help needed climbing 3-5 steps with a railing? : None 6 Click Score: 24    End of Session Equipment Utilized During Treatment: Gait belt Activity Tolerance: Patient limited by fatigue (hypoxia with ambulation) Patient left: in chair;with call bell/phone within reach Nurse Communication: Mobility status PT Visit Diagnosis: Difficulty in walking, not elsewhere classified (R26.2)    Time: 1241-1311 PT Time Calculation (min) (ACUTE ONLY): 30 min   Charges:   PT Evaluation $PT Eval Low Complexity: 1 Low PT Treatments $Gait Training: 8-22 mins       Blondell Reveal Kistler PT 09/04/2020  Acute Rehabilitation Services Pager [phone] Office [phone]

## 2020-09-04 NOTE — Progress Notes (Signed)
Pt. Is stable and resting. Assessment complete and finding agreeable with  previous RN.

## 2020-09-04 NOTE — Progress Notes (Signed)
  Echocardiogram 2D Echocardiogram has been performed.  Kathryn Mahoney Kathryn Mahoney 09/04/2020, 3:51 PM

## 2020-09-04 NOTE — Consult Note (Signed)
Cardiology Consultation:   Patient ID: Kathryn Mahoney MRN: [id number]; DOB: [birth year]  Admit date: 09/03/2020 Date of Consult: 09/04/2020  Primary Care Provider: Benito Mccreedy, MD [facility] Cardiologist: No primary care provider on file.  Cardiology Dr. Lisbeth Renshaw at [facility] clinic  [facility] Electrophysiologist:  None    Patient Profile:   Kathryn Mahoney is a 59 y.o. female with a hx of HTN, chronic diastolic HF, moderate MR and TR, DM-2, pulmonary HTN, COPD, asthma, OSA anh hx or Iron def anemia who is being seen today for the evaluation of CHF at the request of Dr. Starla Link.  History of Present Illness:   Ms. Friesz with above hx and neg nuc in 2018, EF 55-60% in 12/2018 she tells of hx of cardiac cath at [facility] but I do not have a copy. She is followed by [place] [facility]. ([facility] in [facility] per notes)  She has been on numerous HTN meds with side effects.  Hx of pulmonary hypertension.  Pt saw PCP about a month ago and her furosemide was changed to torsemide and since she continues with increase of edema.    Pt was seen in ER 08/27/20 and treated for CHF with IV lasix and she diuresed in ER, troponins neg and outpt torsemide increased to 40 mg daily.   She presented back 09/03/20, with CHF with increased wt and SOB. Her SOB more of DOE.  Does have 02 at home and uses her CPAP   Her CXR with pulmonary edema, she rec'd IV lasix.   EKG:  The EKG of admit was personally reviewed and demonstrates:  SR at 97 Rt atrial enlargement and compared to EKG 08/27/2020 no changes though now rate slower.   Telemetry:  Telemetry was personally reviewed and demonstrates:  SR to ST  Na 137, K+ 3.8, Cr 0.96,  Hgb 10.6 WBC 9.2  plts 353 Hs troponin 10 9 BNP 49 Hgb A1c of 9.1  covid neg  Currently BP 130/79 P102 No I&O No wts rec'd 60 mg lasix in ER and now on 40 IV BID.   Feeling better today but still feels she has fluid on her.  She has not seen  regular cardiologist due to [place] -she may want to have cardiologist in this area.  She notes that she has had severe CHF in past where she had to sit up to sleep but this time she came early and only had DOE.  No chest pain.    Past Medical History:  Diagnosis Date   Asthma    B12 deficiency    CHF (congestive heart failure) (HCC)    COPD (chronic obstructive pulmonary disease) (HCC)    Diabetes mellitus without complication (HCC)    Hypertension    Morbid obesity (HCC)    SOB (shortness of breath)    Urinary incontinence     Past Surgical History:  Procedure Laterality Date   CESAREAN SECTION     ECTOPIC PREGNANCY SURGERY     KNEE SURGERY       Home Medications:  Prior to Admission medications   Medication Sig Start Date End Date Taking? Authorizing Provider  acetaminophen (TYLENOL) 650 MG CR tablet Take 650 mg by mouth every 8 (eight) hours as needed for pain.   Yes [provider]  albuterol (PROVENTIL) (2.5 MG/3ML) 0.083% nebulizer solution Take 3 mLs (2.5 mg total) by nebulization every 6 (six) hours as needed for wheezing or shortness of breath. 03/21/17  Yes Robinson, [place] N, PA-C  albuterol (VENTOLIN HFA) 108 (90 Base) MCG/ACT inhaler Inhale 2 puffs into the lungs every 6 (six) hours as needed for wheezing or shortness of breath. 11/05/13  Yes [provider]  cholecalciferol (VITAMIN D3) 25 MCG (1000 UNIT) tablet Take 1,000 Units by mouth daily.   Yes [provider]  cromolyn (OPTICROM) 4 % ophthalmic solution Place 1 drop into both eyes in the morning, at noon, in the evening, and at bedtime.   Yes [provider]  glipiZIDE (GLUCOTROL XL) 5 MG 24 hr tablet Take 5 mg by mouth every evening.  08/24/20  Yes [provider]  GUAIFENESIN PO Take 1 tablet by mouth daily as needed (CONGESTION).   Yes [provider]  hydrocortisone 2.5 % cream Apply 1 application topically 3 (three) times daily as needed for  itching.   Yes [provider]  OXYGEN Inhale 2 L into the lungs as needed (shortness of breath).   Yes [provider]  spironolactone (ALDACTONE) 25 MG tablet Take 25 mg by mouth daily. 10/31/19  Yes [provider]  SYMBICORT 80-4.5 MCG/ACT inhaler Inhale 2 puffs into the lungs 2 (two) times daily. 11/07/17  Yes [provider]  torsemide (DEMADEX) 20 MG tablet Take 2 tablets (40 mg total) by mouth daily. 08/27/20  Yes Drenda Freeze, MD  predniSONE (DELTASONE) 20 MG tablet 3 tabs po day one, then 2 tabs daily x 4 days Patient not taking: Reported on 03/21/2017 09/05/16   Domenic Moras, PA-C    Inpatient Medications: Scheduled Meds:  cholecalciferol  1,000 Units Oral Daily   cromolyn  1 drop Both Eyes QID   enoxaparin (LOVENOX) injection  70 mg Subcutaneous Q24H   fluticasone furoate-vilanterol  1 puff Inhalation Daily   furosemide  40 mg Intravenous BID   insulin aspart  0-15 Units Subcutaneous TID WC   insulin aspart  0-5 Units Subcutaneous QHS   nitroGLYCERIN  1 inch Topical Q6H   spironolactone  25 mg Oral Daily   Continuous Infusions:  PRN Meds: acetaminophen **OR** acetaminophen, albuterol, guaiFENesin-dextromethorphan, promethazine  Allergies:    Allergies  Allergen Reactions   Contrast Media [Iodinated Diagnostic Agents] Other (See Comments)    Coughing, sick to stomach, increase blood pressure    Empagliflozin Other (See Comments)    Yeast infection   Losartan Other (See Comments)    Back pain   Lisinopril Nausea And Vomiting   Metformin Palpitations   Ondansetron Other (See Comments) and Swelling    Social History:   Social History   Socioeconomic History   Marital status: Single    Spouse name: Not on file   Number of children: 4   Years of education: Not on file   Highest education level: Not on file  Occupational History   Not on file  Tobacco Use   Smoking status: Former Smoker    Packs/day:  1.50    Years: 30.00    Pack years: 45.00    Types: Cigarettes    Quit date: 10/27/2010    Years since quitting: 9.8   Smokeless tobacco: Never Used  Substance and Sexual Activity   Alcohol use: No   Drug use: No   Sexual activity: Not Currently  Other Topics Concern   Not on file  Social History Narrative   Not on file   Social Determinants of Health   Financial Resource Strain:    Difficulty of Paying Living Expenses: Not on file  Food Insecurity:    Worried About Charity fundraiser in the Last Year: Not on file   [employer] of Food in the Last Year: Not on file  Transportation Needs:    Lack of Transportation (Medical): Not on file   Lack of Transportation (Non-Medical): Not on file  Physical Activity:    Days of Exercise per Week: Not on file   Minutes of Exercise per Session: Not on file  Stress:    Feeling of Stress : Not on file  Social Connections:    Frequency of Communication with Friends and Family: Not on file   Frequency of Social Gatherings with Friends and Family: Not on file   Attends Religious Services: Not on file   Active Member of Clubs or Organizations: Not on file   Attends Archivist Meetings: Not on file   Marital Status: Not on file  Intimate Partner Violence:    Fear of Current or Ex-Partner: Not on file   Emotionally Abused: Not on file   Physically Abused: Not on file   Sexually Abused: Not on file    Family History:    Family History  Problem Relation Age of Onset   Stroke Mother    Asthma Mother      ROS:  Please see the history of present illness.  General:no colds or fevers, + weight increase from 316 to 330 lbs Skin:no rashes or ulcers HEENT:no blurred vision, no congestion CV:see HPI PUL:see HPI GI:no diarrhea constipation or melena, no indigestion GU:no hematuria, no dysuria MS:no joint pain, no claudication Neuro:no syncope, no lightheadedness Endo:+ diabetes, no thyroid disease  All  other ROS reviewed and negative.     Physical Exam/Data:   Vitals:   09/03/20 2228 09/04/20 0223 09/04/20 0559 09/04/20 0827  BP: 122/70 123/68 130/79   Pulse: (!) 107 (!) 101 100   Resp: 20 18 20    Temp: 98.8 F (37.1 C) 98.4 F (36.9 C) 98.3 F (36.8 C)   TempSrc: Oral Oral Oral   SpO2: 93% 95% 93% 94%  Weight:      Height:       No intake or output data in the 24 hours ending 09/04/20 0832 Last 3 Weights 09/03/2020 09/03/2020 11/20/2017  Weight (lbs) 321 lb 4.8 oz 310 lb 290 lb  Weight (kg) 145.741 kg 140.615 kg 131.543 kg     Body mass index is 47.45 kg/m.  General:  Well nourished, well developed, in no acute distress, no SOB with talking  HEENT: normal Lymph: no adenopathy Neck: no JVD Endocrine:  No thryomegaly Vascular: No carotid bruits; pedal pulses 2+ bilaterally  Cardiac:  normal S1, S2; RRR; no murmur gallup or rub, heart sounds muffled Lungs:  clear to auscultation bilaterally, no wheezing, rhonchi or rales  Abd: soft, nontender, no hepatomegaly  Ext: mild pitting edema Musculoskeletal:  No deformities, BUE and BLE strength normal and equal Skin: warm and dry  Neuro:  Alert and oriented X 3 MAE follows commands, sitting on side of bed., no focal abnormalities noted Psych:  Normal affect   Relevant CV Studies: echo dated 12/27/2018, which showed normal LV systolic function, EF 19-41%, with mild concentric left ventricular hypertrophy, moderate mitral regurgitation, moderate tricuspid regurgitation, and estimated RVSP 63 mm Hg.  nuclear stress test dated 10/31/2016, which showed no evidence of ischemia.    Laboratory Data:  High Sensitivity Troponin:   Recent Labs  Lab 08/27/20 1854 08/27/20 2024 09/03/20 1309 09/03/20 1513  TROPONINIHS 14  14 9 10      Chemistry Recent Labs  Lab 09/03/20 1309 09/04/20 0740  NA 140 137  K 3.6 3.8  CL 100 97*  CO2 32 31  GLUCOSE 136* 184*  BUN 15 18  CREATININE 0.84 0.96  CALCIUM 8.8* 9.0  GFRNONAA >60 >60   ANIONGAP 8 9    Recent Labs  Lab 09/04/20 0740  PROT 6.9  ALBUMIN 3.2*  AST 18  ALT 22  ALKPHOS 82  BILITOT 0.5   Hematology Recent Labs  Lab 09/03/20 1309 09/04/20 0740  WBC 9.2 9.2  RBC 4.36 4.24  HGB 11.0* 10.6*  HCT 37.0 35.9*  MCV 84.9 84.7  MCH 25.2* 25.0*  MCHC 29.7* 29.5*  RDW 16.8* 17.1*  PLT 406* 353   BNP Recent Labs  Lab 09/03/20 1309  BNP 49.4    DDimer  Recent Labs  Lab 09/03/20 1309  DDIMER <0.27     Radiology/Studies:  DG Chest Portable 1 View  Result Date: 09/03/2020 CLINICAL DATA:  Dyspnea. Shortness of breath. Difficulty breathing. EXAM: PORTABLE CHEST 1 VIEW COMPARISON:  One-view chest x-ray 08/27/2020. FINDINGS: Heart is enlarged. Increasing pulmonary vascular congestion edema is present. Mild atelectasis is present bases. IMPRESSION: Cardiomegaly with increasing pulmonary vascular congestion and edema compatible with congestive heart failure. Electronically Signed   By: San Morelle M.D.   On: 09/03/2020 13:56     Assessment and Plan:   1. Acute on chronic diastolic HF due to HTN with normal EF in 2020.  Has rec'd lasix no I&O or wts. Have ordered.  She had been on lasix but recently changed to torsemide due to increasing edema.  Edema continues and DOE.  outpt on aldactone 25 daily, torsemide 40 mg daily.  Had been on lasix 80 mg prior to change to torsemide.  She has good understanding of salt intake for HF.  She has scales at home, her dry wt she believes to be 316 lbs.  2. HTN intolerant to coreg/ACE/ARB BP on arrival 180/123   And on 08/27/20 BP 190/124  Not on BP meds at home.  Now BP controlled.  3. No hx of CAD though multiple risk factors with DM, HTN, she believes she had cath at [facility] but I do not find record. 4. Pulmonary HTN on Echo 08/2016 RVSP >70 mmhg. Last one 12/27/18 with RVSP of 63 mmHg  Echo here pending.   May need RHC depending on echo. 5. Moderate MR and TR on echo 12/2018     6. Copd/asthma per IM no  wheezing on admit  7. DM-2 poor control with elevated A1c discussed with pt she has side effects with some of the meds. 8. OSA on CPAP 9. Morbid obesity has tried several times to lose wt.  It os difficult for her.          New York Heart Association (NYHA) Functional Class NYHA Class III        For questions or updates, please contact [facility] Please consult [email or web address] for contact info under    Signed, Cecilie Kicks, NP  09/04/2020 8:32 AM

## 2020-09-04 NOTE — Progress Notes (Addendum)
Patient ID: Kathryn Mahoney, female   DOB: 1961-07-07, 59 y.o.   MRN: [id number]  PROGRESS NOTE    Kathryn Mahoney  ZHY:[id number] DOB: 14-Oct-1961 DOA: 09/03/2020 PCP: Benito Mccreedy, MD   Brief Narrative:  59 year old female with history of COPD, chronic diastolic heart failure, morbid obesity, diabetes mellitus type 2 presented with worsening shortness of breath and swelling. On presentation, chest x-ray showed pulmonary edema and vascular congestion. She was started on IV Lasix.  Assessment & Plan:   Acute on chronic diastolic heart failure -Currently on intravenous Lasix. Continue spironolactone. Strict input and output. Daily weights. Fluid restriction. -2D echo -I have consulted cardiology. -unable to take ACEi/ARB due to back pain and unable to take coreg due to to intolerance per cardiology notes in care everywhere  Diabetes mellitus type 2 with hyperglycemia -A1c 9.1. Continue CBGs with SSI  Hypertension -Monitor blood pressure. Continue Lasix and spironolactone  COPD -Continue Breo Ellipta and as needed inhalers/nebs -Currently stable. Not wheezing. Oxygen as needed  Morbid obesity -Outpatient follow-up. Patient was counseled on lifestyle changes by admitting hospitalist  Generalized conditioning -PT eval   DVT prophylaxis: Lovenox Code Status: Full Family Communication: None at bedside Disposition Plan: Status is: Inpatient  Remains inpatient appropriate because:Inpatient level of care appropriate due to severity of illness. Currently volume overloaded and requiring IV diuretics   Dispo: The patient is from: Home              Anticipated d/c is to: Home              Anticipated d/c date is: 2 days              Patient currently is not medically stable to d/c.   Consultants: Cardiology  Procedures: Echo pending  Antimicrobials: None   Subjective: Patient seen and examined at bedside. She feels slightly better but still feels short of breath with  exertion. Still feels swollen. No overnight fever, vomiting or chest pain reported.  Objective: Vitals:   09/03/20 2228 09/04/20 0223 09/04/20 0559 09/04/20 0827  BP: 122/70 123/68 130/79   Pulse: (!) 107 (!) 101 100   Resp: 20 18 20    Temp: 98.8 F (37.1 C) 98.4 F (36.9 C) 98.3 F (36.8 C)   TempSrc: Oral Oral Oral   SpO2: 93% 95% 93% 94%  Weight:      Height:       No intake or output data in the 24 hours ending 09/04/20 1135 Filed Weights   09/03/20 1136 09/03/20 1659  Weight: (!) 140.6 kg (!) 145.7 kg    Examination:  General exam: Appears calm and comfortable. Looks older than stated age Respiratory system: Bilateral decreased breath sounds at bases with basilar crackles  cardiovascular system: S1 & S2 heard, intermittently tachycardic gastrointestinal system: Abdomen is morbidly obese, nondistended, soft and nontender. Normal bowel sounds heard. Extremities: No cyanosis, clubbing; bilateral lower extremity edema present Central nervous system: Alert and oriented. No focal neurological deficits. Moving extremities Skin: No rashes, lesions or ulcers Psychiatry: Judgement and insight appear normal. Mood & affect appropriate.     Data Reviewed: I have personally reviewed following labs and imaging studies  CBC: Recent Labs  Lab 09/03/20 1309 09/04/20 0740  WBC 9.2 9.2  HGB 11.0* 10.6*  HCT 37.0 35.9*  MCV 84.9 84.7  PLT 406* 295   Basic Metabolic Panel: Recent Labs  Lab 09/03/20 1309 09/04/20 0740  NA 140 137  K 3.6 3.8  CL 100 97*  CO2  32 31  GLUCOSE 136* 184*  BUN 15 18  CREATININE 0.84 0.96  CALCIUM 8.8* 9.0   GFR: Estimated Creatinine Clearance: 95.2 mL/min (by C-G formula based on SCr of 0.96 mg/dL). Liver Function Tests: Recent Labs  Lab 09/04/20 0740  AST 18  ALT 22  ALKPHOS 82  BILITOT 0.5  PROT 6.9  ALBUMIN 3.2*   No results for input(s): LIPASE, AMYLASE in the last 168 hours. No results for input(s): AMMONIA in the last 168  hours. Coagulation Profile: No results for input(s): INR, PROTIME in the last 168 hours. Cardiac Enzymes: No results for input(s): CKTOTAL, CKMB, CKMBINDEX, TROPONINI in the last 168 hours. BNP (last 3 results) No results for input(s): PROBNP in the last 8760 hours. HbA1C: Recent Labs    09/03/20 1309  HGBA1C 9.1*   CBG: Recent Labs  Lab 09/03/20 1644 09/03/20 2212 09/04/20 0756  GLUCAP 101* 171* 171*   Lipid Profile: No results for input(s): CHOL, HDL, LDLCALC, TRIG, CHOLHDL, LDLDIRECT in the last 72 hours. Thyroid Function Tests: No results for input(s): TSH, T4TOTAL, FREET4, T3FREE, THYROIDAB in the last 72 hours. Anemia Panel: No results for input(s): VITAMINB12, FOLATE, FERRITIN, TIBC, IRON, RETICCTPCT in the last 72 hours. Sepsis Labs: No results for input(s): PROCALCITON, LATICACIDVEN in the last 168 hours.  Recent Results (from the past 240 hour(s))  Respiratory Panel by RT PCR (Flu A&B, Covid) - Nasopharyngeal Swab     Status: None   Collection Time: 08/27/20  6:54 PM   Specimen: Nasopharyngeal Swab  Result Value Ref Range Status   SARS Coronavirus 2 by RT PCR NEGATIVE NEGATIVE Final    Comment: (NOTE) SARS-CoV-2 target nucleic acids are NOT DETECTED.  The SARS-CoV-2 RNA is generally detectable in upper respiratoy specimens during the acute phase of infection. The lowest concentration of SARS-CoV-2 viral copies this assay can detect is 131 copies/mL. A negative result does not preclude SARS-Cov-2 infection and should not be used as the sole basis for treatment or other patient management decisions. A negative result may occur with  improper specimen collection/handling, submission of specimen other than nasopharyngeal swab, presence of viral mutation(s) within the areas targeted by this assay, and inadequate number of viral copies (<131 copies/mL). A negative result must be combined with clinical observations, patient history, and epidemiological information.  The expected result is Negative.  Fact Sheet for Patients:  PinkCheek.be  Fact Sheet for Healthcare Providers:  GravelBags.it  This test is no t yet approved or cleared by the [place] FDA and  has been authorized for detection and/or diagnosis of SARS-CoV-2 by FDA under an Emergency Use Authorization (EUA). This EUA will remain  in effect (meaning this test can be used) for the duration of the COVID-19 declaration under Section 564(b)(1) of the Act, 21 U.S.C. section 360bbb-3(b)(1), unless the authorization is terminated or revoked sooner.     Influenza A by PCR NEGATIVE NEGATIVE Final   Influenza B by PCR NEGATIVE NEGATIVE Final    Comment: (NOTE) The Xpert Xpress SARS-CoV-2/FLU/RSV assay is intended as an aid in  the diagnosis of influenza from Nasopharyngeal swab specimens and  should not be used as a sole basis for treatment. Nasal washings and  aspirates are unacceptable for Xpert Xpress SARS-CoV-2/FLU/RSV  testing.  Fact Sheet for Patients: PinkCheek.be  Fact Sheet for Healthcare Providers: GravelBags.it  This test is not yet approved or cleared by the [place] FDA and  has been authorized for detection and/or diagnosis of SARS-CoV-2 by  FDA  under an Emergency Use Authorization (EUA). This EUA will remain  in effect (meaning this test can be used) for the duration of the  Covid-19 declaration under Section 564(b)(1) of the Act, 21  U.S.C. section 360bbb-3(b)(1), unless the authorization is  terminated or revoked. Performed at [facility], [place] [address], [place], [place] [address]   Respiratory Panel by RT PCR (Flu A&B, Covid) - Nasopharyngeal Swab     Status: None   Collection Time: 09/03/20  3:13 PM   Specimen: Nasopharyngeal Swab  Result Value Ref Range Status   SARS Coronavirus 2 by RT PCR NEGATIVE NEGATIVE Final     Comment: (NOTE) SARS-CoV-2 target nucleic acids are NOT DETECTED.  The SARS-CoV-2 RNA is generally detectable in upper respiratoy specimens during the acute phase of infection. The lowest concentration of SARS-CoV-2 viral copies this assay can detect is 131 copies/mL. A negative result does not preclude SARS-Cov-2 infection and should not be used as the sole basis for treatment or other patient management decisions. A negative result may occur with  improper specimen collection/handling, submission of specimen other than nasopharyngeal swab, presence of viral mutation(s) within the areas targeted by this assay, and inadequate number of viral copies (<131 copies/mL). A negative result must be combined with clinical observations, patient history, and epidemiological information. The expected result is Negative.  Fact Sheet for Patients:  PinkCheek.be  Fact Sheet for Healthcare Providers:  GravelBags.it  This test is no t yet approved or cleared by the [place] FDA and  has been authorized for detection and/or diagnosis of SARS-CoV-2 by FDA under an Emergency Use Authorization (EUA). This EUA will remain  in effect (meaning this test can be used) for the duration of the COVID-19 declaration under Section 564(b)(1) of the Act, 21 U.S.C. section 360bbb-3(b)(1), unless the authorization is terminated or revoked sooner.     Influenza A by PCR NEGATIVE NEGATIVE Final   Influenza B by PCR NEGATIVE NEGATIVE Final    Comment: (NOTE) The Xpert Xpress SARS-CoV-2/FLU/RSV assay is intended as an aid in  the diagnosis of influenza from Nasopharyngeal swab specimens and  should not be used as a sole basis for treatment. Nasal washings and  aspirates are unacceptable for Xpert Xpress SARS-CoV-2/FLU/RSV  testing.  Fact Sheet for Patients: PinkCheek.be  Fact Sheet for Healthcare  Providers: GravelBags.it  This test is not yet approved or cleared by the [place] FDA and  has been authorized for detection and/or diagnosis of SARS-CoV-2 by  FDA under an Emergency Use Authorization (EUA). This EUA will remain  in effect (meaning this test can be used) for the duration of the  Covid-19 declaration under Section 564(b)(1) of the Act, 21  U.S.C. section 360bbb-3(b)(1), unless the authorization is  terminated or revoked. Performed at [facility], [place] [address], [place], [place] [address]          Radiology Studies: DG Chest Portable 1 View  Result Date: 09/03/2020 CLINICAL DATA:  Dyspnea. Shortness of breath. Difficulty breathing. EXAM: PORTABLE CHEST 1 VIEW COMPARISON:  One-view chest x-ray 08/27/2020. FINDINGS: Heart is enlarged. Increasing pulmonary vascular congestion edema is present. Mild atelectasis is present bases. IMPRESSION: Cardiomegaly with increasing pulmonary vascular congestion and edema compatible with congestive heart failure. Electronically Signed   By: San Morelle M.D.   On: 09/03/2020 13:56        Scheduled Meds: . cholecalciferol  1,000 Units Oral Daily  . cromolyn  1 drop Both Eyes QID  .  enoxaparin (LOVENOX) injection  70 mg Subcutaneous Q24H  . fluticasone furoate-vilanterol  1 puff Inhalation Daily  . furosemide  40 mg Intravenous BID  . insulin aspart  0-15 Units Subcutaneous TID WC  . insulin aspart  0-5 Units Subcutaneous QHS  . nitroGLYCERIN  1 inch Topical Q6H  . spironolactone  25 mg Oral Daily   Continuous Infusions:        Aline August, MD [facility] 09/04/2020, 11:35 AM

## 2020-09-04 NOTE — Progress Notes (Signed)
Patient states that spironolactone makes her back hurt and gives her a headache and she no longer wishes to take this medication.

## 2020-09-04 NOTE — Progress Notes (Signed)
Paged MD for continued complaints of lower back pain post administration of all available pain medications patient reports that back pain is still 10/10.Patient states "Kathryn Mahoney are giving me all these lasix and dehydrating me, that's why my back and my kidneys are hurting me."

## 2020-09-05 DIAGNOSIS — D649 Anemia, unspecified: Secondary | ICD-10-CM

## 2020-09-05 DIAGNOSIS — I509 Heart failure, unspecified: Secondary | ICD-10-CM

## 2020-09-05 DIAGNOSIS — Z7189 Other specified counseling: Secondary | ICD-10-CM

## 2020-09-05 LAB — CBC WITH DIFFERENTIAL/PLATELET
Abs Immature Granulocytes: 0.03 10*3/uL (ref 0.00–0.07)
Basophils Absolute: 0 10*3/uL (ref 0.0–0.1)
Basophils Relative: 0 %
Eosinophils Absolute: 0.2 10*3/uL (ref 0.0–0.5)
Eosinophils Relative: 2 %
HCT: 36.1 % (ref 36.0–46.0)
Hemoglobin: 10.6 g/dL — ABNORMAL LOW (ref 12.0–15.0)
Immature Granulocytes: 0 %
Lymphocytes Relative: 29 %
Lymphs Abs: 2.9 10*3/uL (ref 0.7–4.0)
MCH: 25.1 pg — ABNORMAL LOW (ref 26.0–34.0)
MCHC: 29.4 g/dL — ABNORMAL LOW (ref 30.0–36.0)
MCV: 85.3 fL (ref 80.0–100.0)
Monocytes Absolute: 0.6 10*3/uL (ref 0.1–1.0)
Monocytes Relative: 6 %
Neutro Abs: 6.4 10*3/uL (ref 1.7–7.7)
Neutrophils Relative %: 63 %
Platelets: 390 10*3/uL (ref 150–400)
RBC: 4.23 MIL/uL (ref 3.87–5.11)
RDW: 17 % — ABNORMAL HIGH (ref 11.5–15.5)
WBC: 10.1 10*3/uL (ref 4.0–10.5)
nRBC: 0 % (ref 0.0–0.2)

## 2020-09-05 LAB — BASIC METABOLIC PANEL
Anion gap: 8 (ref 5–15)
BUN: 21 mg/dL (ref 8–23)
CO2: 31 mmol/L (ref 22–32)
Calcium: 8.8 mg/dL — ABNORMAL LOW (ref 8.9–10.3)
Chloride: 95 mmol/L — ABNORMAL LOW (ref 98–111)
Creatinine, Ser: 1 mg/dL (ref 0.44–1.00)
GFR, Estimated: >60 mL/min (ref >60)
Glucose, Bld: 194 mg/dL — ABNORMAL HIGH (ref 70–99)
Potassium: 3.5 mmol/L (ref 3.5–5.1)
Sodium: 134 mmol/L — ABNORMAL LOW (ref 135–145)

## 2020-09-05 LAB — GLUCOSE, CAPILLARY
Glucose-Capillary: 164 mg/dL — ABNORMAL HIGH (ref 70–99)
Glucose-Capillary: 198 mg/dL — ABNORMAL HIGH (ref 70–99)
Glucose-Capillary: 176 mg/dL — ABNORMAL HIGH (ref 70–99)
Glucose-Capillary: 150 mg/dL — ABNORMAL HIGH (ref 70–99)
Glucose-Capillary: 164 mg/dL — ABNORMAL HIGH (ref 70–99)

## 2020-09-05 LAB — URINALYSIS, ROUTINE W REFLEX MICROSCOPIC
Bilirubin Urine: NEGATIVE
Glucose, UA: NEGATIVE mg/dL
Hgb urine dipstick: NEGATIVE
Ketones, ur: NEGATIVE mg/dL
Leukocytes,Ua: NEGATIVE
Nitrite: NEGATIVE
Protein, ur: NEGATIVE mg/dL
Specific Gravity, Urine: 1.006 (ref 1.005–1.030)
pH: 7 (ref 5.0–8.0)

## 2020-09-05 LAB — MAGNESIUM: Magnesium: 2.2 mg/dL (ref 1.7–2.4)

## 2020-09-05 MED ORDER — POTASSIUM CHLORIDE CRYS ER 20 MEQ PO TBCR
40.0000 meq | EXTENDED_RELEASE_TABLET | Freq: Once | ORAL | Status: AC
  Administered 2020-09-05: 40 meq via ORAL
  Filled 2020-09-05: qty 2

## 2020-09-05 MED ORDER — TORSEMIDE 20 MG PO TABS: 40.0000 mg | ORAL_TABLET | Freq: Every day | ORAL | Status: DC

## 2020-09-05 MED ORDER — TORSEMIDE 20 MG PO TABS
40.0000 mg | ORAL_TABLET | Freq: Every day | ORAL | Status: DC
  Administered 2020-09-05 – 2020-09-06 (×2): 40 mg via ORAL
  Filled 2020-09-05 (×2): qty 2

## 2020-09-05 MED ORDER — TRAMADOL HCL 50 MG PO TABS
50.0000 mg | ORAL_TABLET | Freq: Two times a day (BID) | ORAL | Status: AC | PRN
  Administered 2020-09-05 – 2020-09-06 (×2): 50 mg via ORAL
  Filled 2020-09-05 (×2): qty 1

## 2020-09-05 MED ORDER — LIVING BETTER WITH HEART FAILURE BOOK: Freq: Once | Status: AC

## 2020-09-05 NOTE — Progress Notes (Signed)
Progress Note  Patient Name: Kathryn Mahoney Date of Encounter: 09/05/2020  Primary Cardiologist: Several different providers, Cardiology Dr. Lisbeth Renshaw at [facility] [place], newly seen by Dr. Harrell Gave this admission  Subjective   Feels like her breathing is somewhat better, asking whether she should go home today. Reports her O2 sat dropped with PT yesterday. States she's had home O2 for 4-5 years and uses it PRN. No chest pain reported. Very concerned about becoming dehydrated and requesting IV fluids as she believed that patients on diuretics should also be given fluids. She typically drinks a lot to rehydrate at home. Requesting potassium repletion as well as she tends to get very crampy with back pain when she is receiving diuretics.  Inpatient Medications    Scheduled Meds: . cholecalciferol  1,000 Units Oral Daily  . cromolyn  1 drop Both Eyes QID  . enoxaparin (LOVENOX) injection  70 mg Subcutaneous Q24H  . fluticasone furoate-vilanterol  1 puff Inhalation Daily  . furosemide  40 mg Intravenous BID  . insulin aspart  0-15 Units Subcutaneous TID WC  . insulin aspart  0-5 Units Subcutaneous QHS  . lidocaine  1 patch Transdermal Q24H  . nitroGLYCERIN  1 inch Topical Q6H  . spironolactone  25 mg Oral Daily   Continuous Infusions:  PRN Meds: acetaminophen **OR** acetaminophen, albuterol, guaiFENesin-dextromethorphan, promethazine   Vital Signs    Vitals:   09/04/20 1740 09/04/20 2206 09/05/20 0209 09/05/20 0655  BP: (!) 143/83 138/89 114/61 122/85  Pulse: (!) 112 (!) 106 (!) 109 (!) 104  Resp: 18 18 18 18   Temp: 98.6 F (37 C) 98.5 F (36.9 C) 98.3 F (36.8 C) 98.2 F (36.8 C)  TempSrc:  Oral Oral Oral  SpO2: 96% 97% 91% 93%  Weight:      Height:        Intake/Output Summary (Last 24 hours) at 09/05/2020 1032 Last data filed at 09/05/2020 0829 Gross per 24 hour  Intake 240 ml  Output 700 ml  Net -460 ml   Last 3 Weights 09/04/2020  09/03/2020 09/03/2020  Weight (lbs) 322 lb 5 oz 321 lb 4.8 oz 310 lb  Weight (kg) 146.2 kg 145.741 kg 140.615 kg     Telemetry    NSR/ST - Personally Reviewed  Physical Exam   GEN: No acute distress. Obese obese AAF.  HEENT: Normocephalic, atraumatic, sclera non-icteric. Neck: No JVD or bruits. Cardiac: RRR no murmurs, rubs, or gallops.  Radials/DP/PT 1+ and equal bilaterally.  Respiratory: Clear to auscultation bilaterally. Breathing is unlabored. GI: Soft, nontender, non-distended, BS +x 4. MS: no deformity. Extremities: No clubbing or cyanosis. Trace sockline edema. Distal pedal pulses are 2+ and equal bilaterally. Neuro:  AAOx3. Follows commands. Psych:  Responds to questions appropriately with a normal affect, somewhat pressured speech.  Labs    High Sensitivity Troponin:   Recent Labs  Lab 08/27/20 1854 08/27/20 2024 09/03/20 1309 09/03/20 1513  TROPONINIHS 14 14 9 10       Cardiac EnzymesNo results for input(s): TROPONINI in the last 168 hours. No results for input(s): TROPIPOC in the last 168 hours.   Chemistry Recent Labs  Lab 09/03/20 1309 09/04/20 0740 09/05/20 0446  NA 140 137 134*  K 3.6 3.8 3.5  CL 100 97* 95*  CO2 32 31 31  GLUCOSE 136* 184* 194*  BUN 15 18 21   CREATININE 0.84 0.96 1.00  CALCIUM 8.8* 9.0 8.8*  PROT  --  6.9  --  ALBUMIN  --  3.2*  --   AST  --  18  --   ALT  --  22  --   ALKPHOS  --  82  --   BILITOT  --  0.5  --   GFRNONAA >60 >60 >60  ANIONGAP 8 9 8      Hematology Recent Labs  Lab 09/03/20 1309 09/04/20 0740 09/05/20 0446  WBC 9.2 9.2 10.1  RBC 4.36 4.24 4.23  HGB 11.0* 10.6* 10.6*  HCT 37.0 35.9* 36.1  MCV 84.9 84.7 85.3  MCH 25.2* 25.0* 25.1*  MCHC 29.7* 29.5* 29.4*  RDW 16.8* 17.1* 17.0*  PLT 406* 353 390    BNP Recent Labs  Lab 09/03/20 1309  BNP 49.4     DDimer  Recent Labs  Lab 09/03/20 1309  DDIMER <0.27     Radiology    DG Chest Portable 1 View  Result Date: 09/03/2020 CLINICAL  DATA:  Dyspnea. Shortness of breath. Difficulty breathing. EXAM: PORTABLE CHEST 1 VIEW COMPARISON:  One-view chest x-ray 08/27/2020. FINDINGS: Heart is enlarged. Increasing pulmonary vascular congestion edema is present. Mild atelectasis is present bases. IMPRESSION: Cardiomegaly with increasing pulmonary vascular congestion and edema compatible with congestive heart failure. Electronically Signed   By: San Morelle M.D.   On: 09/03/2020 13:56   ECHOCARDIOGRAM COMPLETE  Result Date: 09/04/2020    ECHOCARDIOGRAM REPORT   Patient Name:   Kathryn Mahoney Date of Exam: 09/04/2020 Medical Rec #:  [id number]       Height:       69.0 in Accession #:    [id number]      Weight:       321.3 lb Date of Birth:  31-Aug-1961       BSA:          2.527 m Patient Age:    59 years        BP:           130/79 mmHg Patient Gender: F               HR:           101 bpm. Exam Location:  Inpatient Procedure: 2D Echo, Cardiac Doppler and Color Doppler Indications:    I50.33 Acute on chronic diastolic (congestive) heart failure  History:        Patient has prior history of Echocardiogram examinations, most                 recent 11/11/2010. COPD, Signs/Symptoms:Dyspnea; Risk                 Factors:Hypertension and Diabetes.  Sonographer:    Jonelle Sidle Dance Referring Phys: [id number] Jonnie Finner  Sonographer Comments: Suboptimal subcostal window and patient is morbidly obese. IMPRESSIONS  1. Left ventricular ejection fraction, by estimation, is 50 to 55%. The left ventricle has low normal function. The left ventricle has no regional wall motion abnormalities. Indeterminate diastolic filling due to E-A fusion.  2. Right ventricular systolic function is normal. The right ventricular size is normal.  3. There is equal systolic and diastolic pulmonary vein flow. Suspect that the MR is non-severe, but quantitative assessment is limited by image quality and the eccentric nature of the MR jet. The mitral valve is normal in structure. Moderate  mitral valve regurgitation. No evidence of mitral stenosis.  4. The aortic valve is normal in structure. Aortic valve regurgitation is not visualized. No aortic stenosis is present.  5. The inferior vena cava  is normal in size with greater than 50% respiratory variability, suggesting right atrial pressure of 3 mmHg. Conclusion(s)/Recommendation(s): If there is clinical suspicion for severe mitral insufficiency, consider TEE. FINDINGS  Left Ventricle: Left ventricular ejection fraction, by estimation, is 50 to 55%. The left ventricle has low normal function. The left ventricle has no regional wall motion abnormalities. The left ventricular internal cavity size was normal in size. There is no left ventricular hypertrophy. Indeterminate diastolic filling due to E-A fusion. Right Ventricle: The right ventricular size is normal. No increase in right ventricular wall thickness. Right ventricular systolic function is normal. Left Atrium: Left atrial size was normal in size. Right Atrium: Right atrial size was normal in size. Pericardium: There is no evidence of pericardial effusion. Mitral Valve: There is equal systolic and diastolic pulmonary vein flow. Suspect that the MR is non-severe, but quantitative assessment is limited by image quality and the eccentric nature of the MR jet. The mitral valve is normal in structure. There is mild thickening of the mitral valve leaflet(s). Mild mitral annular calcification. Moderate mitral valve regurgitation, with eccentric posteriorly directed jet. No evidence of mitral valve stenosis. Tricuspid Valve: The tricuspid valve is normal in structure. Tricuspid valve regurgitation is not demonstrated. No evidence of tricuspid stenosis. Aortic Valve: The aortic valve is normal in structure. Aortic valve regurgitation is not visualized. No aortic stenosis is present. Pulmonic Valve: The pulmonic valve was normal in structure. Pulmonic valve regurgitation is not visualized. No evidence of  pulmonic stenosis. Aorta: The aortic root is normal in size and structure. Venous: The inferior vena cava is normal in size with greater than 50% respiratory variability, suggesting right atrial pressure of 3 mmHg. IAS/Shunts: No atrial level shunt detected by color flow Doppler.  LEFT VENTRICLE PLAX 2D LVIDd:         5.10 cm  Diastology LVIDs:         3.70 cm  LV e' lateral:   7.51 cm/s LV PW:         1.10 cm  LV E/e' lateral: 17.2 LV IVS:        1.00 cm LVOT diam:     1.90 cm LV SV:         64 LV SV Index:   25 LVOT Area:     2.84 cm  RIGHT VENTRICLE RV Basal diam:  2.90 cm RV S prime:     17.50 cm/s TAPSE (M-mode): 2.4 cm LEFT ATRIUM              Index       RIGHT ATRIUM           Index LA diam:        4.30 cm  1.70 cm/m  RA Area:     18.90 cm LA Vol (A2C):   108.0 ml 42.74 ml/m RA Volume:   50.60 ml  20.02 ml/m LA Vol (A4C):   55.2 ml  21.84 ml/m LA Biplane Vol: 77.9 ml  30.83 ml/m  AORTIC VALVE LVOT Vmax:   116.00 cm/s LVOT Vmean:  76.100 cm/s LVOT VTI:    0.224 m  AORTA Ao Root diam: 3.30 cm Ao Asc diam:  3.50 cm MITRAL VALVE MV Area (PHT): 5.27 cm      SHUNTS MV Decel Time: 144 msec      Systemic VTI:  0.22 m MR Peak grad:    142.6 mmHg  Systemic Diam: 1.90 cm MR Mean grad:    98.0 mmHg MR Vmax:  597.00 cm/s MR Vmean:        472.5 cm/s MR PISA:         1.57 cm MR PISA Eff ROA: 10 mm MR PISA Radius:  0.50 cm MV E velocity: 129.00 cm/s Sanda Klein MD Electronically signed by Sanda Klein MD Signature Date/Time: 09/04/2020/4:06:20 PM    Final     Cardiac Studies   2D echo 09/04/20 1. Left ventricular ejection fraction, by estimation, is 50 to 55%. The  left ventricle has low normal function. The left ventricle has no regional  wall motion abnormalities. Indeterminate diastolic filling due to E-A  fusion.  2. Right ventricular systolic function is normal. The right ventricular  size is normal.  3. There is equal systolic and diastolic pulmonary vein flow. Suspect  that the MR  is non-severe, but quantitative assessment is limited by image  quality and the eccentric nature of the MR jet. The mitral valve is normal  in structure. Moderate mitral  valve regurgitation. No evidence of mitral stenosis.  4. The aortic valve is normal in structure. Aortic valve regurgitation is  not visualized. No aortic stenosis is present.  5. The inferior vena cava is normal in size with greater than 50%  respiratory variability, suggesting right atrial pressure of 3 mmHg.   Conclusion(s)/Recommendation(s): If there is clinical suspicion for severe  mitral insufficiency, consider TEE.   Patient Profile     59 y.o. female followed at multiple prior facilities making history challenging, severe morbid obesity, HTN, chronic diastolic CHF, OSA on CPAP, pulmonary HTN, DM, COPD, asthma, iron deficiency anemia presented with worsening edema and DOE. Also has history of multiple intolerances (Coreg, ACEI, ARB, doesn't like the way contrast makes her feel, prior side effects with DM meds). BNP normal. D-dimer is negative.  Assessment & Plan    1. Acute on chronic diastolic CHF - also possible moderate mitral regurgitation - echo mentions need for potential TEE - will review with MD, did not yet go into detail with patient pending clarification - borderline sinus tachycardia appears chronic - baseline weight reported somewhere between 310 or 316-318lb per pt report. She was 322 yesterday, not yet weighed today, I/Os do not appear complete so challenging to assess objective progress - continue IV Lasix pending discussion with MD - sounds like she was told by someone earlier she could go home? Remains on spironolactone (which she states causes back pain) and NTG paste so need a home regimen that she will be compliant with at home - give KCl 33meq x 1 now. Mg WNL - rx CHF booklet - reviewed fluid restriction with patient - agree that [place] would be the optimal way to assess her volume status as  habitus makes this challenging but the patient declined yesterday  2. Pulmonary HTN - no TR jet or elevated PASP noted on echo? Will ask Dr. Harrell Gave to review images  3. HTN - she may be an ideal candidate to follow up with Dr. Oval Linsey in the HTN clinic after discharge given prior med intolerances - check UA to assess degree of proteinuria given albumin 3.2 and DM  4. Morbid obesity with OSA on CPAP - will remain crux of issues if untreated - reports use of CPAP, also has PRN O2 at home (may have concomitant OHS) - recommend referral to weight management program at discharge - she seems motivated  5. IDA - Hgb 10 range, prior baseline 10-11 mostly  6. DM - prior yeast infection with Vania Rea  For questions or updates, please contact [place] Please consult [email or web address] for contact info under Cardiology/STEMI.  Signed, Charlie Pitter, PA-C 09/05/2020, 10:32 AM

## 2020-09-05 NOTE — Progress Notes (Signed)
Patient ID: Kathryn Mahoney, female   DOB: 1961/09/04, 59 y.o.   MRN: [id number]  PROGRESS NOTE    Teleah Villamar  OAC:[id number] DOB: [birth year] DOA: 09/03/2020 PCP: Benito Mccreedy, MD   Brief Narrative:  59 year old female with history of COPD, chronic diastolic heart failure, morbid obesity, diabetes mellitus type 2 presented with worsening shortness of breath and swelling. On presentation, chest x-ray showed pulmonary edema and vascular congestion. She was started on IV Lasix.  Assessment & Plan:   Acute on chronic diastolic heart failure-acute on chronic respiratory failure with hypoxia Patient remains on furosemide and spironolactone.  2D echocardiogram showed EF to be 50 to 55%.  Cardiology is following.  Plan is to change her to oral diuretics today.  Continue strict ins and outs and daily weight.  Patient instructed not to consume too much fluid.  Not on ACE inhibitor or ARB due to previous intolerances.   Wean down oxygen.  She states that she has oxygen at home that she uses as needed.  Diabetes mellitus type 2 with hyperglycemia -A1c 9.1. Continue CBGs with SSI  Essential hypertension Stable.  Continue to monitor closely.  COPD -Continue Breo Ellipta and as needed inhalers/nebs -Currently stable. Not wheezing. Oxygen as needed  Normocytic anemia Hemoglobin is stable.  No evidence of overt bleeding.  Morbid obesity Estimated body mass index is 47.6 kg/m as calculated from the following:   Height as of this encounter: 5\' 9"  (1.753 m).   Weight as of this encounter: 146.2 kg.   DVT prophylaxis: Lovenox Code Status: Full Family Communication: None at bedside Disposition Plan: Hopefully home tomorrow. Status is: Inpatient  Remains inpatient appropriate because:Inpatient level of care appropriate due to severity of illness. Currently volume overloaded and requiring IV diuretics   Dispo: The patient is from: Home              Anticipated d/c is to: Home               Anticipated d/c date is: 2 days              Patient currently is not medically stable to d/c.   Consultants: Cardiology  Procedures: Echo   Antimicrobials: None   Subjective: Patient denies any chest pain or shortness of breath.  Complains of back pain which he attributes to pain from her "kidneys".  No nausea vomiting.  Objective: Vitals:   09/04/20 1740 09/04/20 2206 09/05/20 0209 09/05/20 0655  BP: (!) 143/83 138/89 114/61 122/85  Pulse: (!) 112 (!) 106 (!) 109 (!) 104  Resp: 18 18 18 18   Temp: 98.6 F (37 C) 98.5 F (36.9 C) 98.3 F (36.8 C) 98.2 F (36.8 C)  TempSrc:  Oral Oral Oral  SpO2: 96% 97% 91% 93%  Weight:      Height:        Intake/Output Summary (Last 24 hours) at 09/05/2020 1317 Last data filed at 09/05/2020 0829 Gross per 24 hour  Intake 240 ml  Output 700 ml  Net -460 ml   Filed Weights   09/03/20 1136 09/03/20 1659 09/04/20 1700  Weight: (!) 140.6 kg (!) 145.7 kg (!) 146.2 kg    Examination:  General appearance: Awake alert.  In no distress Resp: Normal effort at rest.  Good aeration bilaterally.  Few crackles at the bases. Cardio: S1-S2 is normal regular.  No S3-S4.  No rubs murmurs or bruit GI: Abdomen is soft.  Nontender nondistended.  Bowel sounds are present normal.  No masses  organomegaly Extremities: No edema.  Full range of motion of lower extremities. Neurologic: Alert and oriented x3.  No focal neurological deficits.      Data Reviewed: I have personally reviewed following labs and imaging studies  CBC: Recent Labs  Lab 09/03/20 1309 09/04/20 0740 09/05/20 0446  WBC 9.2 9.2 10.1  NEUTROABS  --   --  6.4  HGB 11.0* 10.6* 10.6*  HCT 37.0 35.9* 36.1  MCV 84.9 84.7 85.3  PLT 406* 353 188   Basic Metabolic Panel: Recent Labs  Lab 09/03/20 1309 09/04/20 0740 09/05/20 0446  NA 140 137 134*  K 3.6 3.8 3.5  CL 100 97* 95*  CO2 32 31 31  GLUCOSE 136* 184* 194*  BUN 15 18 21   CREATININE 0.84 0.96 1.00  CALCIUM  8.8* 9.0 8.8*  MG  --   --  2.2   GFR: Estimated Creatinine Clearance: 91.6 mL/min (by C-G formula based on SCr of 1 mg/dL). Liver Function Tests: Recent Labs  Lab 09/04/20 0740  AST 18  ALT 22  ALKPHOS 82  BILITOT 0.5  PROT 6.9  ALBUMIN 3.2*   HbA1C: Recent Labs    09/03/20 1309  HGBA1C 9.1*   CBG: Recent Labs  Lab 09/04/20 1224 09/04/20 1613 09/04/20 2203 09/05/20 0812 09/05/20 1214  GLUCAP 203* 138* 164* 198* 176*     Recent Results (from the past 240 hour(s))  Respiratory Panel by RT PCR (Flu A&B, Covid) - Nasopharyngeal Swab     Status: None   Collection Time: 08/27/20  6:54 PM   Specimen: Nasopharyngeal Swab  Result Value Ref Range Status   SARS Coronavirus 2 by RT PCR NEGATIVE NEGATIVE Final    Comment: (NOTE) SARS-CoV-2 target nucleic acids are NOT DETECTED.  The SARS-CoV-2 RNA is generally detectable in upper respiratoy specimens during the acute phase of infection. The lowest concentration of SARS-CoV-2 viral copies this assay can detect is 131 copies/mL. A negative result does not preclude SARS-Cov-2 infection and should not be used as the sole basis for treatment or other patient management decisions. A negative result may occur with  improper specimen collection/handling, submission of specimen other than nasopharyngeal swab, presence of viral mutation(s) within the areas targeted by this assay, and inadequate number of viral copies (<131 copies/mL). A negative result must be combined with clinical observations, patient history, and epidemiological information. The expected result is Negative.  Fact Sheet for Patients:  PinkCheek.be  Fact Sheet for Healthcare Providers:  GravelBags.it  This test is no t yet approved or cleared by the [place] FDA and  has been authorized for detection and/or diagnosis of SARS-CoV-2 by FDA under an Emergency Use Authorization (EUA). This EUA will  remain  in effect (meaning this test can be used) for the duration of the COVID-19 declaration under Section 564(b)(1) of the Act, 21 U.S.C. section 360bbb-3(b)(1), unless the authorization is terminated or revoked sooner.     Influenza A by PCR NEGATIVE NEGATIVE Final   Influenza B by PCR NEGATIVE NEGATIVE Final    Comment: (NOTE) The Xpert Xpress SARS-CoV-2/FLU/RSV assay is intended as an aid in  the diagnosis of influenza from Nasopharyngeal swab specimens and  should not be used as a sole basis for treatment. Nasal washings and  aspirates are unacceptable for Xpert Xpress SARS-CoV-2/FLU/RSV  testing.  Fact Sheet for Patients: PinkCheek.be  Fact Sheet for Healthcare Providers: GravelBags.it  This test is not yet approved or cleared by the [place] FDA and  has been  authorized for detection and/or diagnosis of SARS-CoV-2 by  FDA under an Emergency Use Authorization (EUA). This EUA will remain  in effect (meaning this test can be used) for the duration of the  Covid-19 declaration under Section 564(b)(1) of the Act, 21  U.S.C. section 360bbb-3(b)(1), unless the authorization is  terminated or revoked. Performed at [facility], [place] [address] [place], [place] [address]   Respiratory Panel by RT PCR (Flu A&B, Covid) - Nasopharyngeal Swab     Status: None   Collection Time: 09/03/20  3:13 PM   Specimen: Nasopharyngeal Swab  Result Value Ref Range Status   SARS Coronavirus 2 by RT PCR NEGATIVE NEGATIVE Final    Comment: (NOTE) SARS-CoV-2 target nucleic acids are NOT DETECTED.  The SARS-CoV-2 RNA is generally detectable in upper respiratoy specimens during the acute phase of infection. The lowest concentration of SARS-CoV-2 viral copies this assay can detect is 131 copies/mL. A negative result does not preclude SARS-Cov-2 infection and should not be used as the sole basis for treatment or other  patient management decisions. A negative result may occur with  improper specimen collection/handling, submission of specimen other than nasopharyngeal swab, presence of viral mutation(s) within the areas targeted by this assay, and inadequate number of viral copies (<131 copies/mL). A negative result must be combined with clinical observations, patient history, and epidemiological information. The expected result is Negative.  Fact Sheet for Patients:  PinkCheek.be  Fact Sheet for Healthcare Providers:  GravelBags.it  This test is no t yet approved or cleared by the [place] FDA and  has been authorized for detection and/or diagnosis of SARS-CoV-2 by FDA under an Emergency Use Authorization (EUA). This EUA will remain  in effect (meaning this test can be used) for the duration of the COVID-19 declaration under Section 564(b)(1) of the Act, 21 U.S.C. section 360bbb-3(b)(1), unless the authorization is terminated or revoked sooner.     Influenza A by PCR NEGATIVE NEGATIVE Final   Influenza B by PCR NEGATIVE NEGATIVE Final    Comment: (NOTE) The Xpert Xpress SARS-CoV-2/FLU/RSV assay is intended as an aid in  the diagnosis of influenza from Nasopharyngeal swab specimens and  should not be used as a sole basis for treatment. Nasal washings and  aspirates are unacceptable for Xpert Xpress SARS-CoV-2/FLU/RSV  testing.  Fact Sheet for Patients: PinkCheek.be  Fact Sheet for Healthcare Providers: GravelBags.it  This test is not yet approved or cleared by the [place] FDA and  has been authorized for detection and/or diagnosis of SARS-CoV-2 by  FDA under an Emergency Use Authorization (EUA). This EUA will remain  in effect (meaning this test can be used) for the duration of the  Covid-19 declaration under Section 564(b)(1) of the Act, 21  U.S.C. section  360bbb-3(b)(1), unless the authorization is  terminated or revoked. Performed at [facility], [place] [address] [place], [place] [address]          Radiology Studies: DG Chest Portable 1 View  Result Date: 09/03/2020 CLINICAL DATA:  Dyspnea. Shortness of breath. Difficulty breathing. EXAM: PORTABLE CHEST 1 VIEW COMPARISON:  One-view chest x-ray 08/27/2020. FINDINGS: Heart is enlarged. Increasing pulmonary vascular congestion edema is present. Mild atelectasis is present bases. IMPRESSION: Cardiomegaly with increasing pulmonary vascular congestion and edema compatible with congestive heart failure. Electronically Signed   By: San Morelle M.D.   On: 09/03/2020 13:56   ECHOCARDIOGRAM COMPLETE  Result Date: 09/04/2020    ECHOCARDIOGRAM REPORT   Patient Name:  Theodore Ghuman Date of Exam: 09/04/2020 Medical Rec #:  [id number]       Height:       69.0 in Accession #:    [id number]      Weight:       321.3 lb Date of Birth:  1961/10/19       BSA:          2.527 m Patient Age:    66 years        BP:           130/79 mmHg Patient Gender: F               HR:           101 bpm. Exam Location:  Inpatient Procedure: 2D Echo, Cardiac Doppler and Color Doppler Indications:    I50.33 Acute on chronic diastolic (congestive) heart failure  History:        Patient has prior history of Echocardiogram examinations, most                 recent 11/11/2010. COPD, Signs/Symptoms:Dyspnea; Risk                 Factors:Hypertension and Diabetes.  Sonographer:    Jonelle Sidle Dance Referring Phys: [id number] Jonnie Finner  Sonographer Comments: Suboptimal subcostal window and patient is morbidly obese. IMPRESSIONS  1. Left ventricular ejection fraction, by estimation, is 50 to 55%. The left ventricle has low normal function. The left ventricle has no regional wall motion abnormalities. Indeterminate diastolic filling due to E-A fusion.  2. Right ventricular systolic function is normal. The right ventricular  size is normal.  3. There is equal systolic and diastolic pulmonary vein flow. Suspect that the MR is non-severe, but quantitative assessment is limited by image quality and the eccentric nature of the MR jet. The mitral valve is normal in structure. Moderate mitral valve regurgitation. No evidence of mitral stenosis.  4. The aortic valve is normal in structure. Aortic valve regurgitation is not visualized. No aortic stenosis is present.  5. The inferior vena cava is normal in size with greater than 50% respiratory variability, suggesting right atrial pressure of 3 mmHg. Conclusion(s)/Recommendation(s): If there is clinical suspicion for severe mitral insufficiency, consider TEE. FINDINGS  Left Ventricle: Left ventricular ejection fraction, by estimation, is 50 to 55%. The left ventricle has low normal function. The left ventricle has no regional wall motion abnormalities. The left ventricular internal cavity size was normal in size. There is no left ventricular hypertrophy. Indeterminate diastolic filling due to E-A fusion. Right Ventricle: The right ventricular size is normal. No increase in right ventricular wall thickness. Right ventricular systolic function is normal. Left Atrium: Left atrial size was normal in size. Right Atrium: Right atrial size was normal in size. Pericardium: There is no evidence of pericardial effusion. Mitral Valve: There is equal systolic and diastolic pulmonary vein flow. Suspect that the MR is non-severe, but quantitative assessment is limited by image quality and the eccentric nature of the MR jet. The mitral valve is normal in structure. There is mild thickening of the mitral valve leaflet(s). Mild mitral annular calcification. Moderate mitral valve regurgitation, with eccentric posteriorly directed jet. No evidence of mitral valve stenosis. Tricuspid Valve: The tricuspid valve is normal in structure. Tricuspid valve regurgitation is not demonstrated. No evidence of tricuspid  stenosis. Aortic Valve: The aortic valve is normal in structure. Aortic valve regurgitation is not visualized. No aortic stenosis is present. Pulmonic Valve: The pulmonic valve  was normal in structure. Pulmonic valve regurgitation is not visualized. No evidence of pulmonic stenosis. Aorta: The aortic root is normal in size and structure. Venous: The inferior vena cava is normal in size with greater than 50% respiratory variability, suggesting right atrial pressure of 3 mmHg. IAS/Shunts: No atrial level shunt detected by color flow Doppler.  LEFT VENTRICLE PLAX 2D LVIDd:         5.10 cm  Diastology LVIDs:         3.70 cm  LV e' lateral:   7.51 cm/s LV PW:         1.10 cm  LV E/e' lateral: 17.2 LV IVS:        1.00 cm LVOT diam:     1.90 cm LV SV:         64 LV SV Index:   25 LVOT Area:     2.84 cm  RIGHT VENTRICLE RV Basal diam:  2.90 cm RV S prime:     17.50 cm/s TAPSE (M-mode): 2.4 cm LEFT ATRIUM              Index       RIGHT ATRIUM           Index LA diam:        4.30 cm  1.70 cm/m  RA Area:     18.90 cm LA Vol (A2C):   108.0 ml 42.74 ml/m RA Volume:   50.60 ml  20.02 ml/m LA Vol (A4C):   55.2 ml  21.84 ml/m LA Biplane Vol: 77.9 ml  30.83 ml/m  AORTIC VALVE LVOT Vmax:   116.00 cm/s LVOT Vmean:  76.100 cm/s LVOT VTI:    0.224 m  AORTA Ao Root diam: 3.30 cm Ao Asc diam:  3.50 cm MITRAL VALVE MV Area (PHT): 5.27 cm      SHUNTS MV Decel Time: 144 msec      Systemic VTI:  0.22 m MR Peak grad:    142.6 mmHg  Systemic Diam: 1.90 cm MR Mean grad:    98.0 mmHg MR Vmax:         597.00 cm/s MR Vmean:        472.5 cm/s MR PISA:         1.57 cm MR PISA Eff ROA: 10 mm MR PISA Radius:  0.50 cm MV E velocity: 129.00 cm/s Dani Gobble Croitoru MD Electronically signed by Sanda Klein MD Signature Date/Time: 09/04/2020/4:06:20 PM    Final         Scheduled Meds: . cholecalciferol  1,000 Units Oral Daily  . cromolyn  1 drop Both Eyes QID  . enoxaparin (LOVENOX) injection  70 mg Subcutaneous Q24H  . fluticasone  furoate-vilanterol  1 puff Inhalation Daily  . insulin aspart  0-15 Units Subcutaneous TID WC  . insulin aspart  0-5 Units Subcutaneous QHS  . lidocaine  1 patch Transdermal Q24H  . nitroGLYCERIN  1 inch Topical Q6H  . spironolactone  25 mg Oral Daily  . [START ON 09/06/2020] torsemide  40 mg Oral Daily   Continuous Infusions:     Bonnielee Haff, MD [facility] 09/05/2020, 1:17 PM

## 2020-09-05 NOTE — Progress Notes (Signed)
Physical Therapy Treatment/Discharge from PT Patient Details Name: Kathryn Mahoney MRN: [id number] DOB: 1961/06/14 Today's Date: 09/05/2020  SATURATION QUALIFICATIONS: (This note is used to comply with regulatory documentation for home oxygen)  Patient Saturations on Room Air at Rest = 93%  Patient Saturations on [employer] while Ambulating = 87%  Patient Saturations on 2 Liters of oxygen while Ambulating = 91%    History of Present Illness 59 year old female with history of COPD, chronic diastolic heart failure, morbid obesity, diabetes mellitus type 2 presented with worsening shortness of breath and swelling. On presentation, chest x-ray showed pulmonary edema and vascular congestion. Dx of acute on chronic CHF.    PT Comments    Pt remains Ind with mobility although she continues to report getting dyspneic and hot with ambulation. She currently does not have any PT needs. Recommend nursing continue to assess O2 needs as necessary. PT will sign off.   Follow Up Recommendations  No PT follow up     Equipment Recommendations  None recommended by PT    Recommendations for Other Services       Precautions / Restrictions Precautions Precaution Comments: monitor O2 Restrictions Weight Bearing Restrictions: No    Mobility  Bed Mobility               General bed mobility comments: up in recliner  Transfers Overall transfer level: Independent                  Ambulation/Gait Ambulation/Gait assistance: Independent Gait Distance (Feet): 150 Feet Assistive device: None       General Gait Details: No LOB. dyspnea 2/4.   Stairs             Wheelchair Mobility    Modified Rankin (Stroke Patients Only)       Balance                                            Cognition Arousal/Alertness: Awake/alert Behavior During Therapy: WFL for tasks assessed/performed Overall Cognitive Status: Within Functional Limits for tasks  assessed                                        Exercises      General Comments        Pertinent Vitals/Pain Pain Assessment: No/denies pain    Home Living                      Prior Function            PT Goals (current goals can now be found in the care plan section) Progress towards PT goals: Goals met and updated - see care plan    Frequency           PT Plan  (discharge from PT-pt is Ind with mobility)    Co-evaluation              AM-PAC PT "6 Clicks" Mobility   Outcome Measure  Help needed turning from your back to your side while in a flat bed without using bedrails?: None Help needed moving from lying on your back to sitting on the side of a flat bed without using bedrails?: None Help needed moving to and from a bed to a chair (including  a wheelchair)?: None Help needed standing up from a chair using your arms (e.g., wheelchair or bedside chair)?: None Help needed to walk in hospital room?: None Help needed climbing 3-5 steps with a railing? : None 6 Click Score: 24    End of Session Equipment Utilized During Treatment: Oxygen Activity Tolerance: Patient tolerated treatment well Patient left: in chair;with call bell/phone within reach         Time: 1611-1622 PT Time Calculation (min) (ACUTE ONLY): 11 min  Charges:  $Gait Training: 8-22 mins                       Doreatha Massed, PT Acute Rehabilitation  Office: [phone] Pager: [phone]

## 2020-09-06 ENCOUNTER — Telehealth: Payer: Self-pay | Admitting: Physician Assistant

## 2020-09-06 LAB — CBC
HCT: 37.3 % (ref 36.0–46.0)
Hemoglobin: 11 g/dL — ABNORMAL LOW (ref 12.0–15.0)
MCH: 25 pg — ABNORMAL LOW (ref 26.0–34.0)
MCHC: 29.5 g/dL — ABNORMAL LOW (ref 30.0–36.0)
MCV: 84.8 fL (ref 80.0–100.0)
Platelets: 411 10*3/uL — ABNORMAL HIGH (ref 150–400)
RBC: 4.4 MIL/uL (ref 3.87–5.11)
RDW: 17.1 % — ABNORMAL HIGH (ref 11.5–15.5)
WBC: 10.9 10*3/uL — ABNORMAL HIGH (ref 4.0–10.5)
nRBC: 0 % (ref 0.0–0.2)

## 2020-09-06 LAB — BASIC METABOLIC PANEL
Anion gap: 10 (ref 5–15)
BUN: 26 mg/dL — ABNORMAL HIGH (ref 8–23)
CO2: 28 mmol/L (ref 22–32)
Calcium: 9.4 mg/dL (ref 8.9–10.3)
Chloride: 97 mmol/L — ABNORMAL LOW (ref 98–111)
Creatinine, Ser: 1.12 mg/dL — ABNORMAL HIGH (ref 0.44–1.00)
GFR, Estimated: 56 mL/min — ABNORMAL LOW (ref >60)
Glucose, Bld: 184 mg/dL — ABNORMAL HIGH (ref 70–99)
Potassium: 4.4 mmol/L (ref 3.5–5.1)
Sodium: 135 mmol/L (ref 135–145)

## 2020-09-06 LAB — TSH: TSH: 2.583 u[IU]/mL (ref 0.350–4.500)

## 2020-09-06 LAB — GLUCOSE, CAPILLARY
Glucose-Capillary: 273 mg/dL — ABNORMAL HIGH (ref 70–99)
Glucose-Capillary: 185 mg/dL — ABNORMAL HIGH (ref 70–99)

## 2020-09-06 MED ORDER — TORSEMIDE 20 MG PO TABS
40.0000 mg | ORAL_TABLET | Freq: Every day | ORAL | 0 refills | Status: DC
Start: 2020-09-06 — End: 2020-10-24

## 2020-09-06 MED ORDER — TRAMADOL HCL 50 MG PO TABS: 50.0000 mg | ORAL_TABLET | Freq: Two times a day (BID) | ORAL | 0 refills | Status: DC | PRN

## 2020-09-06 NOTE — Telephone Encounter (Signed)
Patient is currently admitted to hospital.

## 2020-09-06 NOTE — Progress Notes (Signed)
Brief cardiology follow up note: Patient planned for discharge today. She did well on oral torsemide yesterday.   [facility] will sign off.   Medication Recommendations:  Continue spironolactone 25 mg daily, torsemide 40 mg daily Other recommendations (labs, testing, etc):  BMET Follow up as an outpatient:  We will arrange for her to see me in the clinic for follow up.  Buford Dresser, MD, PhD [facility]  [address], [place], [place] [address] [phone]

## 2020-09-06 NOTE — Discharge Instructions (Signed)

## 2020-09-06 NOTE — Telephone Encounter (Signed)
    Attention TOC pool,  This patient will need a TOC phone call after discharge. They are being discharged today. Dr. Harrell Gave reviewed the discharge plan with the hospitalist.  Follow-up appointment has already been arranged with: 09/17/20 with Dr. Harrell Gave. They are a patient of Buford Dresser, MD.  Thank you! Charlie Pitter, PA-C

## 2020-09-06 NOTE — Discharge Summary (Signed)
[facility]  Physician Discharge Summary   Patient ID: Kathryn Mahoney MRN: [id number] DOB/AGE: [birth year] 59 y.o.  Admit date: 09/03/2020 Discharge date: 09/07/2020  PCP: Dr. Mina Marble with [place]:  Acute on chronic diastolic CHF Acute on chronic respiratory failure with hypoxia Diabetes mellitus type 2 with hyperglycemia Essential hypertension COPD Normocytic anemia Morbid obesity  RECOMMENDATIONS FOR OUTPATIENT FOLLOW UP: 1. Cardiology to arrange outpatient follow-up 2. Patient to follow-up with her PCP as well   Home Health: None Equipment/Devices: None. patient has oxygen at home  CODE STATUS: Full code  DISCHARGE CONDITION: fair  Diet recommendation: Modified carbohydrate  INITIAL HISTORY: 59 year old female with history of COPD, chronic diastolic heart failure, morbid obesity, diabetes mellitus type 2 presented with worsening shortness of breath and swelling. On presentation, chest x-ray showed pulmonary edema and vascular congestion. She was started on IV Lasix.  Consultations:  [facility] cardiology  Procedures:  Echocardiogram. See report below   HOSPITAL COURSE:   Acute on chronic diastolic heart failure-acute on chronic respiratory failure with hypoxia Patient was hospitalized and placed on IV diuretics. Echocardiogram showed EF to be 50 to 55%. Cardiology was consulted. She was changed over to oral diuretics. She diuresed well. She was instructed not to consume too much fluid. No ACE inhibitor or ARB due to previous intolerances. Currently saturating normal at home but she does have oxygen at home that she uses as needed. Discussed with cardiology. Dose of torsemide changed to 40 mg once a day. She was apparently taking 20 mg once a day. Okay for discharge.  Diabetes mellitus type 2 with hyperglycemia A1c 9.1. She may resume glipizide. Follow-up with PCP for further management.  Essential  hypertension Stable  COPD Remained stable. Continue home medications.  Normocytic anemia Hemoglobin is stable.  No evidence of overt bleeding.  Morbid obesity Estimated body mass index is 47.6 kg/m as calculated from the following:   Height as of this encounter: 5\' 9"  (1.753 m).   Weight as of this encounter: 146.2 kg.   Overall noted to have improved. Okay for discharge home today.    PERTINENT LABS:  The results of significant diagnostics from this hospitalization (including imaging, microbiology, ancillary and laboratory) are listed below for reference.    Microbiology: Recent Results (from the past 240 hour(s))  Respiratory Panel by RT PCR (Flu A&B, Covid) - Nasopharyngeal Swab     Status: None   Collection Time: 09/03/20  3:13 PM   Specimen: Nasopharyngeal Swab  Result Value Ref Range Status   SARS Coronavirus 2 by RT PCR NEGATIVE NEGATIVE Final    Comment: (NOTE) SARS-CoV-2 target nucleic acids are NOT DETECTED.  The SARS-CoV-2 RNA is generally detectable in upper respiratoy specimens during the acute phase of infection. The lowest concentration of SARS-CoV-2 viral copies this assay can detect is 131 copies/mL. A negative result does not preclude SARS-Cov-2 infection and should not be used as the sole basis for treatment or other patient management decisions. A negative result may occur with  improper specimen collection/handling, submission of specimen other than nasopharyngeal swab, presence of viral mutation(s) within the areas targeted by this assay, and inadequate number of viral copies (<131 copies/mL). A negative result must be combined with clinical observations, patient history, and epidemiological information. The expected result is Negative.  Fact Sheet for Patients:  PinkCheek.be  Fact Sheet for Healthcare Providers:  GravelBags.it  This test is no t yet approved or cleared by the  [place] FDA and  has  been authorized for detection and/or diagnosis of SARS-CoV-2 by FDA under an Emergency Use Authorization (EUA). This EUA will remain  in effect (meaning this test can be used) for the duration of the COVID-19 declaration under Section 564(b)(1) of the Act, 21 U.S.C. section 360bbb-3(b)(1), unless the authorization is terminated or revoked sooner.     Influenza A by PCR NEGATIVE NEGATIVE Final   Influenza B by PCR NEGATIVE NEGATIVE Final    Comment: (NOTE) The Xpert Xpress SARS-CoV-2/FLU/RSV assay is intended as an aid in  the diagnosis of influenza from Nasopharyngeal swab specimens and  should not be used as a sole basis for treatment. Nasal washings and  aspirates are unacceptable for Xpert Xpress SARS-CoV-2/FLU/RSV  testing.  Fact Sheet for Patients: PinkCheek.be  Fact Sheet for Healthcare Providers: GravelBags.it  This test is not yet approved or cleared by the [place] FDA and  has been authorized for detection and/or diagnosis of SARS-CoV-2 by  FDA under an Emergency Use Authorization (EUA). This EUA will remain  in effect (meaning this test can be used) for the duration of the  Covid-19 declaration under Section 564(b)(1) of the Act, 21  U.S.C. section 360bbb-3(b)(1), unless the authorization is  terminated or revoked. Performed at [facility], [place] [address], [place], [place] [address]      Labs:     Basic Metabolic Panel: Recent Labs  Lab 09/03/20 1309 09/04/20 0740 09/05/20 0446 09/06/20 0443  NA 140 137 134* 135  K 3.6 3.8 3.5 4.4  CL 100 97* 95* 97*  CO2 32 31 31 28   GLUCOSE 136* 184* 194* 184*  BUN 15 18 21  26*  CREATININE 0.84 0.96 1.00 1.12*  CALCIUM 8.8* 9.0 8.8* 9.4  MG  --   --  2.2  --    Liver Function Tests: Recent Labs  Lab 09/04/20 0740  AST 18  ALT 22  ALKPHOS 82  BILITOT 0.5  PROT 6.9  ALBUMIN 3.2*   CBC: Recent  Labs  Lab 09/03/20 1309 09/04/20 0740 09/05/20 0446 09/06/20 0443  WBC 9.2 9.2 10.1 10.9*  NEUTROABS  --   --  6.4  --   HGB 11.0* 10.6* 10.6* 11.0*  HCT 37.0 35.9* 36.1 37.3  MCV 84.9 84.7 85.3 84.8  PLT 406* 353 390 411*   BNP: BNP (last 3 results) Recent Labs    08/27/20 1854 09/03/20 1309  BNP 88.2 49.4     CBG: Recent Labs  Lab 09/05/20 1214 09/05/20 1708 09/05/20 2224 09/06/20 0823 09/06/20 1114  GLUCAP 176* 150* 164* 273* 185*     IMAGING STUDIES DG Chest Portable 1 View  Result Date: 09/03/2020 CLINICAL DATA:  Dyspnea. Shortness of breath. Difficulty breathing. EXAM: PORTABLE CHEST 1 VIEW COMPARISON:  One-view chest x-ray 08/27/2020. FINDINGS: Heart is enlarged. Increasing pulmonary vascular congestion edema is present. Mild atelectasis is present bases. IMPRESSION: Cardiomegaly with increasing pulmonary vascular congestion and edema compatible with congestive heart failure. Electronically Signed   By: San Morelle M.D.   On: 09/03/2020 13:56   DG Chest Port 1 View  Result Date: 08/27/2020 CLINICAL DATA:  Shortness of breath, gained 10 pounds in 1 and half week. Congestive heart failure. Hypertension. EXAM: PORTABLE CHEST 1 VIEW.  Patient is rotated. COMPARISON:  Chest x-ray 07/11/2020, CT chest 02/11/2018, CT abdomen pelvis 07/12/2020 FINDINGS: Stable enlarged cardiac silhouette. Cannot exclude a retrocardiac opacity. No pulmonary edema. Possible trace left pleural effusion. No pneumothorax. No acute osseous abnormality. IMPRESSION: 1. Cardiomegaly with possible  trace bilateral pleural effusions. Consider PA and lateral view. 2. Cannot exclude a retrocardiac opacity. Limited evaluation due to patient rotation. Consider PA and lateral view. Electronically Signed   By: Iven Finn M.D.   On: 08/27/2020 19:02   ECHOCARDIOGRAM COMPLETE  Result Date: 09/04/2020    ECHOCARDIOGRAM REPORT   Patient Name:   MICALAH CABEZAS Date of Exam: 09/04/2020 Medical Rec  #:  [id number]       Height:       69.0 in Accession #:    [id number]      Weight:       321.3 lb Date of Birth:  Jul 06, 1961       BSA:          2.527 m Patient Age:    64 years        BP:           130/79 mmHg Patient Gender: F               HR:           101 bpm. Exam Location:  Inpatient Procedure: 2D Echo, Cardiac Doppler and Color Doppler Indications:    I50.33 Acute on chronic diastolic (congestive) heart failure  History:        Patient has prior history of Echocardiogram examinations, most                 recent 11/11/2010. COPD, Signs/Symptoms:Dyspnea; Risk                 Factors:Hypertension and Diabetes.  Sonographer:    Jonelle Sidle Dance Referring Phys: [id number] Jonnie Finner  Sonographer Comments: Suboptimal subcostal window and patient is morbidly obese. IMPRESSIONS  1. Left ventricular ejection fraction, by estimation, is 50 to 55%. The left ventricle has low normal function. The left ventricle has no regional wall motion abnormalities. Indeterminate diastolic filling due to E-A fusion.  2. Right ventricular systolic function is normal. The right ventricular size is normal.  3. There is equal systolic and diastolic pulmonary vein flow. Suspect that the MR is non-severe, but quantitative assessment is limited by image quality and the eccentric nature of the MR jet. The mitral valve is normal in structure. Moderate mitral valve regurgitation. No evidence of mitral stenosis.  4. The aortic valve is normal in structure. Aortic valve regurgitation is not visualized. No aortic stenosis is present.  5. The inferior vena cava is normal in size with greater than 50% respiratory variability, suggesting right atrial pressure of 3 mmHg. Conclusion(s)/Recommendation(s): If there is clinical suspicion for severe mitral insufficiency, consider TEE. FINDINGS  Left Ventricle: Left ventricular ejection fraction, by estimation, is 50 to 55%. The left ventricle has low normal function. The left ventricle has no regional wall  motion abnormalities. The left ventricular internal cavity size was normal in size. There is no left ventricular hypertrophy. Indeterminate diastolic filling due to E-A fusion. Right Ventricle: The right ventricular size is normal. No increase in right ventricular wall thickness. Right ventricular systolic function is normal. Left Atrium: Left atrial size was normal in size. Right Atrium: Right atrial size was normal in size. Pericardium: There is no evidence of pericardial effusion. Mitral Valve: There is equal systolic and diastolic pulmonary vein flow. Suspect that the MR is non-severe, but quantitative assessment is limited by image quality and the eccentric nature of the MR jet. The mitral valve is normal in structure. There is mild thickening of the mitral valve leaflet(s). Mild mitral annular calcification. Moderate  mitral valve regurgitation, with eccentric posteriorly directed jet. No evidence of mitral valve stenosis. Tricuspid Valve: The tricuspid valve is normal in structure. Tricuspid valve regurgitation is not demonstrated. No evidence of tricuspid stenosis. Aortic Valve: The aortic valve is normal in structure. Aortic valve regurgitation is not visualized. No aortic stenosis is present. Pulmonic Valve: The pulmonic valve was normal in structure. Pulmonic valve regurgitation is not visualized. No evidence of pulmonic stenosis. Aorta: The aortic root is normal in size and structure. Venous: The inferior vena cava is normal in size with greater than 50% respiratory variability, suggesting right atrial pressure of 3 mmHg. IAS/Shunts: No atrial level shunt detected by color flow Doppler.  LEFT VENTRICLE PLAX 2D LVIDd:         5.10 cm  Diastology LVIDs:         3.70 cm  LV e' lateral:   7.51 cm/s LV PW:         1.10 cm  LV E/e' lateral: 17.2 LV IVS:        1.00 cm LVOT diam:     1.90 cm LV SV:         64 LV SV Index:   25 LVOT Area:     2.84 cm  RIGHT VENTRICLE RV Basal diam:  2.90 cm RV S prime:     17.50  cm/s TAPSE (M-mode): 2.4 cm LEFT ATRIUM              Index       RIGHT ATRIUM           Index LA diam:        4.30 cm  1.70 cm/m  RA Area:     18.90 cm LA Vol (A2C):   108.0 ml 42.74 ml/m RA Volume:   50.60 ml  20.02 ml/m LA Vol (A4C):   55.2 ml  21.84 ml/m LA Biplane Vol: 77.9 ml  30.83 ml/m  AORTIC VALVE LVOT Vmax:   116.00 cm/s LVOT Vmean:  76.100 cm/s LVOT VTI:    0.224 m  AORTA Ao Root diam: 3.30 cm Ao Asc diam:  3.50 cm MITRAL VALVE MV Area (PHT): 5.27 cm      SHUNTS MV Decel Time: 144 msec      Systemic VTI:  0.22 m MR Peak grad:    142.6 mmHg  Systemic Diam: 1.90 cm MR Mean grad:    98.0 mmHg MR Vmax:         597.00 cm/s MR Vmean:        472.5 cm/s MR PISA:         1.57 cm MR PISA Eff ROA: 10 mm MR PISA Radius:  0.50 cm MV E velocity: 129.00 cm/s Dani Gobble Croitoru MD Electronically signed by Sanda Klein MD Signature Date/Time: 09/04/2020/4:06:20 PM    Final     DISCHARGE EXAMINATION: Vitals:   09/05/20 2354 09/06/20 0338 09/06/20 0345 09/06/20 0910  BP: 117/86 120/77    Pulse: 99 (!) 101    Resp: 20 18    Temp: 98 F (36.7 C) 98 F (36.7 C)    TempSrc: Oral Oral    SpO2: 97% 98%  97%  Weight:   (!) 145 kg   Height:       General appearance: Awake alert.  In no distress Resp: Improved effort. Clear to auscultation bilaterally. Cardio: S1-S2 is normal regular.  No S3-S4.  No rubs murmurs or bruit GI: Abdomen is soft.  Nontender nondistended.  Bowel sounds are present  normal.  No masses organomegaly    DISPOSITION: Home  Discharge Instructions    (HEART FAILURE PATIENTS) Call MD:  Anytime you have any of the following symptoms: 1) 3 pound weight gain in 24 hours or 5 pounds in 1 week 2) shortness of breath, with or without a dry hacking cough 3) swelling in the hands, feet or stomach 4) if you have to sleep on extra pillows at night in order to breathe.   Complete by: As directed    Call MD for:  difficulty breathing, headache or visual disturbances   Complete by: As  directed    Call MD for:  extreme fatigue   Complete by: As directed    Call MD for:  persistant dizziness or light-headedness   Complete by: As directed    Call MD for:  persistant nausea and vomiting   Complete by: As directed    Call MD for:  severe uncontrolled pain   Complete by: As directed    Call MD for:  temperature >100.4   Complete by: As directed    Diet - low sodium heart healthy   Complete by: As directed    Diet Carb Modified   Complete by: As directed    Discharge instructions   Complete by: As directed    Please take your medications as prescribed.  Follow-up with your primary care provider in 1 week and discuss with them if your back pain persists.  You were cared for by a hospitalist during your hospital stay. If you have any questions about your discharge medications or the care you received while you were in the hospital after you are discharged, you can call the unit and asked to speak with the hospitalist on call if the hospitalist that took care of you is not available. Once you are discharged, your primary care physician will handle any further medical issues. Please note that NO REFILLS for any discharge medications will be authorized once you are discharged, as it is imperative that you return to your primary care physician (or establish a relationship with a primary care physician if you do not have one) for your aftercare needs so that they can reassess your need for medications and monitor your lab values. If you do not have a primary care physician, you can call [phone] for a physician referral.   Increase activity slowly   Complete by: As directed          Allergies as of 09/06/2020      Reactions   Contrast Media [iodinated Diagnostic Agents] Other (See Comments)   Coughing, sick to stomach, increase blood pressure    Empagliflozin Other (See Comments)   Yeast infection   Losartan Other (See Comments)   Back pain   Lisinopril Nausea And Vomiting    Metformin Palpitations   Ondansetron Other (See Comments), Swelling      Medication List    STOP taking these medications   predniSONE 20 MG tablet Commonly known as: DELTASONE     TAKE these medications   acetaminophen 650 MG CR tablet Commonly known as: TYLENOL Take 650 mg by mouth every 8 (eight) hours as needed for pain.   Ventolin HFA 108 (90 Base) MCG/ACT inhaler Generic drug: albuterol Inhale 2 puffs into the lungs every 6 (six) hours as needed for wheezing or shortness of breath.   albuterol (2.5 MG/3ML) 0.083% nebulizer solution Commonly known as: PROVENTIL Take 3 mLs (2.5 mg total) by nebulization every 6 (six) hours  as needed for wheezing or shortness of breath.   cholecalciferol 25 MCG (1000 UNIT) tablet Commonly known as: VITAMIN D3 Take 1,000 Units by mouth daily.   cromolyn 4 % ophthalmic solution Commonly known as: OPTICROM Place 1 drop into both eyes in the morning, at noon, in the evening, and at bedtime.   glipiZIDE 5 MG 24 hr tablet Commonly known as: GLUCOTROL XL Take 5 mg by mouth every evening.   GUAIFENESIN PO Take 1 tablet by mouth daily as needed (CONGESTION).   hydrocortisone 2.5 % cream Apply 1 application topically 3 (three) times daily as needed for itching.   OXYGEN Inhale 2 L into the lungs as needed (shortness of breath).   spironolactone 25 MG tablet Commonly known as: ALDACTONE Take 25 mg by mouth daily.   Symbicort 80-4.5 MCG/ACT inhaler Generic drug: budesonide-formoterol Inhale 2 puffs into the lungs 2 (two) times daily.   torsemide 20 MG tablet Commonly known as: Demadex Take 2 tablets (40 mg total) by mouth daily.   traMADol 50 MG tablet Commonly known as: ULTRAM Take 1 tablet (50 mg total) by mouth every 12 (twelve) hours as needed.         Follow-up Information    Rudene Anda, MD. Schedule an appointment as soon as possible for a visit in 1 week(s).   Specialty: Internal Medicine Contact information: [address] [place] [address] [phone]        Buford Dresser, MD Follow up.   Specialty: Cardiology Why: [facility] location - Monday Sep 17, 2020 11:40 AM (Arrive by 11:25 AM). Contact information: [address] [place] [address] [phone]               TOTAL DISCHARGE TIME: 35 minutes  [facility] Pager on [email or web address]  09/07/2020, 12:38 PM

## 2020-09-06 NOTE — Progress Notes (Signed)
Pt was provided Discharge instructions and verbalized understanding. Pt stated that she had no additional questions for the cardiologist. Jerene Pitch

## 2020-09-06 NOTE — TOC Progression Note (Signed)
Transition of Care [facility]) - Progression Note    Patient Details  Name: Kathryn Mahoney MRN: [id number] Date of Birth: 1961-06-16  [facility] CM/SW Contact  Purcell Mouton, RN Phone Number: 09/06/2020, 12:47 PM  Clinical Narrative:    O2 for home ordered from [employer].         Expected Discharge Plan and Services           Expected Discharge Date: 09/06/20                                     Social Determinants of Health (SDOH) Interventions    Readmission Risk Interventions No flowsheet data found.

## 2020-09-10 NOTE — Telephone Encounter (Signed)
Patient contacted regarding discharge from [facility]  on 09/07/20.  Patient understands to follow up with provider Harrell Gave on 11/22/21at 11:40 at [facility]. Patient understands discharge instructions? yes  Patient understands medications and regiment? yes  Patient understands to bring all medications to this visit? yes

## 2020-09-17 ENCOUNTER — Ambulatory Visit (INDEPENDENT_AMBULATORY_CARE_PROVIDER_SITE_OTHER): Payer: Medicaid Other | Admitting: Cardiology

## 2020-09-17 VITALS — BP 122/80 | HR 86 | Ht 69.0 in | Wt 311.0 lb

## 2020-09-17 DIAGNOSIS — Z79899 Other long term (current) drug therapy: Secondary | ICD-10-CM

## 2020-09-17 DIAGNOSIS — I340 Nonrheumatic mitral (valve) insufficiency: Secondary | ICD-10-CM | POA: Diagnosis not present

## 2020-09-17 DIAGNOSIS — Z09 Encounter for follow-up examination after completed treatment for conditions other than malignant neoplasm: Secondary | ICD-10-CM

## 2020-09-17 DIAGNOSIS — I5032 Chronic diastolic (congestive) heart failure: Secondary | ICD-10-CM | POA: Diagnosis not present

## 2020-09-17 DIAGNOSIS — E1169 Type 2 diabetes mellitus with other specified complication: Secondary | ICD-10-CM

## 2020-09-17 DIAGNOSIS — Z7189 Other specified counseling: Secondary | ICD-10-CM

## 2020-09-17 DIAGNOSIS — E669 Obesity, unspecified: Secondary | ICD-10-CM

## 2020-09-17 NOTE — Progress Notes (Signed)
Cardiology Office Note:    Date:  09/17/2020   ID:  Kathryn Mahoney, DOB [birth year], MRN [id number]  PCP:  Kathryn Mccreedy, MD  Cardiologist:  Kathryn Dresser, MD  Referring MD: Kathryn Mccreedy, MD   CC: post hospital follow up  History of Present Illness:    Kathryn Mahoney is a 59 y.o. female with a hx of morbid obesity, hypertension, chronic diastolic heart failure, OSA on CPAP, pulmonary hypertension, type II diabetes, COPD, asthma who is seen for follow up today. I met her during her hospitalization 08/2020.  Today: Making good urine on torsemide. She sometimes takes twice a day. Bought over the counter potassium to take with her torsemide. Having more back pain, no longer has tramadol at home.   Sometimes can walk fine, sometimes feels short of breath when she is walking. She feels that she has to hydrate more than she was instructed in the hospital. Is watching what she eats, working on weight loss. Was 322 lbs in the hospital, now 311 lbs. Avoids sodium. Has had trivial LE edema, discussed compression stockings and elevating legs.  Has been weighing herself daily.   Hasn't seen PCP yet. Hasn't had follow up labs done yet. Discussed today.   Denies chest pain, PND, orthopnea, or unexpected weight gain. No syncope or palpitations.  Past Medical History:  Diagnosis Date  . Asthma   . B12 deficiency   . CHF (congestive heart failure) ([place])   . COPD (chronic obstructive pulmonary disease) (Kathryn Mahoney)   . Diabetes mellitus without complication (Kathryn Mahoney)   . Hypertension   . Morbid obesity (Kathryn Mahoney)   . SOB (shortness of breath)   . Urinary incontinence     Past Surgical History:  Procedure Laterality Date  . CESAREAN SECTION    . ECTOPIC PREGNANCY SURGERY    . KNEE SURGERY      Current Medications: Current Outpatient Medications on File Prior to Visit  Medication Sig  . acetaminophen (TYLENOL) 650 MG CR tablet Take 650 mg by mouth every 8 (eight) hours as needed for  pain.  Marland Kitchen albuterol (PROVENTIL) (2.5 MG/3ML) 0.083% nebulizer solution Take 3 mLs (2.5 mg total) by nebulization every 6 (six) hours as needed for wheezing or shortness of breath.  Marland Kitchen albuterol (VENTOLIN HFA) 108 (90 Base) MCG/ACT inhaler Inhale 2 puffs into the lungs every 6 (six) hours as needed for wheezing or shortness of breath.  . cholecalciferol (VITAMIN D3) 25 MCG (1000 UNIT) tablet Take 1,000 Units by mouth daily.  . cromolyn (OPTICROM) 4 % ophthalmic solution Place 1 drop into both eyes in the morning, at noon, in the evening, and at bedtime.  Marland Kitchen glipiZIDE (GLUCOTROL XL) 5 MG 24 hr tablet Take 5 mg by mouth every evening.   . GUAIFENESIN PO Take 1 tablet by mouth daily as needed (CONGESTION).  . hydrocortisone 2.5 % cream Apply 1 application topically 3 (three) times daily as needed for itching.  . OXYGEN Inhale 2 L into the lungs as needed (shortness of breath).  . Potassium 99 MG TABS Take 99 mg by mouth daily.  . SYMBICORT 80-4.5 MCG/ACT inhaler Inhale 2 puffs into the lungs 2 (two) times daily.  Marland Kitchen torsemide (DEMADEX) 20 MG tablet Take 2 tablets (40 mg total) by mouth daily. (Patient taking differently: Take 40 mg by mouth 2 (two) times daily. )  . traMADol (ULTRAM) 50 MG tablet Take 1 tablet (50 mg total) by mouth every 12 (twelve) hours as needed.  Marland Kitchen spironolactone (ALDACTONE) 25 MG tablet  Take 25 mg by mouth daily. (Patient not taking: Reported on 09/17/2020)   No current facility-administered medications on file prior to visit.     Allergies:   Contrast media [iodinated diagnostic agents], Empagliflozin, Losartan, Lisinopril, Metformin, and Ondansetron   Social History   Tobacco Use  . Smoking status: Former Smoker    Packs/day: 1.50    Years: 30.00    Pack years: 45.00    Types: Cigarettes    Quit date: 10/27/2010    Years since quitting: 9.8  . Smokeless tobacco: Never Used  Substance Use Topics  . Alcohol use: No  . Drug use: No    Family History: family history  includes Asthma in her mother; Stroke in her mother.  ROS:   Please see the history of present illness.  Additional pertinent ROS otherwise unremarkable.    EKGs/Labs/Other Studies Reviewed:    The following studies were reviewed today: 2D echo 09/04/20 1. Left ventricular ejection fraction, by estimation, is 50 to 55%. The  left ventricle has low normal function. The left ventricle has no regional  wall motion abnormalities. Indeterminate diastolic filling due to E-A  fusion.  2. Right ventricular systolic function is normal. The right ventricular  size is normal.  3. There is equal systolic and diastolic pulmonary vein flow. Suspect  that the MR is non-severe, but quantitative assessment is limited by image  quality and the eccentric nature of the MR jet. The mitral valve is normal  in structure. Moderate mitral  valve regurgitation. No evidence of mitral stenosis.  4. The aortic valve is normal in structure. Aortic valve regurgitation is  not visualized. No aortic stenosis is present.  5. The inferior vena cava is normal in size with greater than 50%  respiratory variability, suggesting right atrial pressure of 3 mmHg.   Conclusion(s)/Recommendation(s): If there is clinical suspicion for severe  mitral insufficiency, consider TEE.   EKG:  EKG is personally reviewed.  The ekg ordered 09/03/20 demonstrates SR, RAE at 85 bpm  Recent Labs: 09/03/2020: B Natriuretic Peptide 49.4 09/04/2020: ALT 22 09/05/2020: Magnesium 2.2 09/06/2020: BUN 26; Creatinine, Ser 1.12; Hemoglobin 11.0; Platelets 411; Potassium 4.4; Sodium 135; TSH 2.583  Recent Lipid Panel No results found for: CHOL, TRIG, HDL, CHOLHDL, VLDL, LDLCALC, LDLDIRECT  Physical Exam:    VS:  BP 122/80 (BP Location: Left Arm, Patient Position: Sitting, Cuff Size: Large)   Pulse 86   Ht '5\' 9"'  (1.753 m)   Wt (!) 311 lb (141.1 kg)   BMI 45.93 kg/m     Wt Readings from Last 3 Encounters:  09/17/20 (!) 311 lb (141.1 kg)   09/06/20 (!) 319 lb 9.6 oz (145 kg)  11/20/17 290 lb (131.5 kg)    GEN: Well nourished, well developed in no acute distress HEENT: Normal, moist mucous membranes NECK: No JVD CARDIAC: regular rhythm, normal S1 and S2, no rubs or gallops. No murmurs. VASCULAR: Radial and DP pulses 2+ bilaterally. No carotid bruits RESPIRATORY:  Clear to auscultation without rales, wheezing or rhonchi  ABDOMEN: Soft, non-tender, non-distended MUSCULOSKELETAL:  Ambulates independently SKIN: Warm and dry, trivial bilateral LE edema NEUROLOGIC:  Alert and oriented x 3. No focal neuro deficits noted. PSYCHIATRIC:  Normal affect    ASSESSMENT:    1. Hospital discharge follow-up   2. Medication management   3. Chronic diastolic heart failure ([place])   4. Nonrheumatic mitral valve regurgitation   5. Diabetes mellitus type 2 in obese ([place])   6. Morbid obesity ([place])  7. Cardiac risk counseling   8. Counseling on health promotion and disease prevention    PLAN:    Chronic diastolic heart failure, with recent admission for acute exacerbation: -good output on torsemide -we again reviewed heart failure education today -discussed multiple factors, including COPD (has been told she needs home O2), OSA on CPAP, morbid obesity -has been told she has pulmonary hypertension. Insufficient TR on echo to evaluate this. Declined RHC while admitted -continue torsemide, spironolactone -consider SGLT2i, declined today -check BMET given use of diuretic and potassium  Mitral regurgitation: -personally reviewed echo, appears moderate -we discussed TEE while she was admitted, but given her respiratory status this was deemed intermediate risk and she declined.  Type II diabetes: -on glipizide -would consider SGTL2i given history of diastolic heart failure  Morbid obesity: BMI ~46 today, discussed diet and exercise recommendations today  Cardiac risk counseling and prevention recommendations: -recommend heart  healthy/Mediterranean diet, with whole grains, fruits, vegetable, fish, lean meats, nuts, and olive oil. Limit salt. -recommend moderate walking, 3-5 times/week for 30-50 minutes each session. Aim for at least 150 minutes.week. Goal should be pace of 3 miles/hours, or walking 1.5 miles in 30 minutes -recommend avoidance of tobacco products. Avoid excess alcohol. -ASCVD risk score: The 10-year ASCVD risk score Mikey Bussing DC Brooke Bonito., et al., 2013) is: 14%   Values used to calculate the score:     Age: 53 years     Sex: Female     Is Non-Hispanic African American: Yes     Diabetic: Yes     Tobacco smoker: No     Systolic Blood Pressure: 371 mmHg     Is BP treated: Yes     HDL Cholesterol: 41 MG/DL     Total Cholesterol: 163 MG/DL    Plan for follow up: 3 mos or sooner as needed  Kathryn Dresser, MD, PhD [place]  [facility]    Medication Adjustments/Labs and Tests Ordered: Current medicines are reviewed at length with the patient today.  Concerns regarding medicines are outlined above.  Orders Placed This Encounter  Procedures  . Basic metabolic panel   No orders of the defined types were placed in this encounter.   Patient Instructions  Medication Instructions:  Your Physician recommend you continue on your current medication as directed.    *If you need a refill on your cardiac medications before your next appointment, please call your pharmacy*   Lab Work: Your physician recommends lab work today ( BMP).  If you have labs (blood work) drawn today and your tests are completely normal, you will receive your results only by: Marland Kitchen MyChart Message (if you have MyChart) OR . A paper copy in the mail If you have any lab test that is abnormal or we need to change your treatment, we will call you to review the results.   Testing/Procedures: None   Follow-Up: At [facility], you and your health needs are our priority.  As part of our continuing mission to provide you  with exceptional heart care, we have created designated Provider Care Teams.  These Care Teams include your primary Cardiologist (physician) and Advanced Practice Providers (APPs -  Physician Assistants and Nurse Practitioners) who all work together to provide you with the care you need, when you need it.  We recommend signing up for the patient portal called "MyChart".  Sign up information is provided on this After Visit Summary.  MyChart is used to connect with patients for Virtual Visits (Telemedicine).  Patients are able to view lab/test results, encounter notes, upcoming appointments, etc.  Non-urgent messages can be sent to your provider as well.   To learn more about what you can do with MyChart, go to [email or web address].    Your next appointment:   3 month(s)  The format for your next appointment:   In Person  Provider:   Buford Dresser, MD      Signed, Kathryn Dresser, MD PhD 09/17/2020    [place]

## 2020-09-17 NOTE — Patient Instructions (Signed)
Medication Instructions:  Your Physician recommend you continue on your current medication as directed.    *If you need a refill on your cardiac medications before your next appointment, please call your pharmacy*   Lab Work: Your physician recommends lab work today (BMP).  If you have labs (blood work) drawn today and your tests are completely normal, you will receive your results only by: . MyChart Message (if you have MyChart) OR . A paper copy in the mail If you have any lab test that is abnormal or we need to change your treatment, we will call you to review the results.   Testing/Procedures: None   Follow-Up: At [facility], you and your health needs are our priority.  As part of our continuing mission to provide you with exceptional heart care, we have created designated Provider Care Teams.  These Care Teams include your primary Cardiologist (physician) and Advanced Practice Providers (APPs -  Physician Assistants and Nurse Practitioners) who all work together to provide you with the care you need, when you need it.  We recommend signing up for the patient portal called "MyChart".  Sign up information is provided on this After Visit Summary.  MyChart is used to connect with patients for Virtual Visits (Telemedicine).  Patients are able to view lab/test results, encounter notes, upcoming appointments, etc.  Non-urgent messages can be sent to your provider as well.   To learn more about what you can do with MyChart, go to [email or web address].    Your next appointment:   3 month(s)  The format for your next appointment:   In Person  Provider:   Bridgette Christopher, MD    

## 2020-09-18 LAB — BASIC METABOLIC PANEL
BUN/Creatinine Ratio: 17 (ref 12–28)
BUN: 22 mg/dL (ref 8–27)
CO2: 26 mmol/L (ref 20–29)
Calcium: 10 mg/dL (ref 8.7–10.3)
Chloride: 98 mmol/L (ref 96–106)
Creatinine, Ser: 1.31 mg/dL — ABNORMAL HIGH (ref 0.57–1.00)
GFR calc Af Amer: 51 mL/min/{1.73_m2} — ABNORMAL LOW (ref >59)
GFR calc non Af Amer: 44 mL/min/{1.73_m2} — ABNORMAL LOW (ref >59)
Glucose: 166 mg/dL — ABNORMAL HIGH (ref 65–99)
Potassium: 4.1 mmol/L (ref 3.5–5.2)
Sodium: 142 mmol/L (ref 134–144)

## 2020-09-26 ENCOUNTER — Other Ambulatory Visit: Payer: Self-pay

## 2020-09-26 DIAGNOSIS — Z79899 Other long term (current) drug therapy: Secondary | ICD-10-CM

## 2020-10-23 ENCOUNTER — Telehealth: Payer: Self-pay | Admitting: Cardiology

## 2020-10-23 NOTE — Telephone Encounter (Signed)
*  STAT* If patient is at the pharmacy, call can be transferred to refill team.   1. Which medications need to be refilled? (please list name of each medication and dose if known) a new prescription for Torsemide- need enough for 30 days until her doctor appt  2. Which pharmacy/location (including street and city if local pharmacy) is medication to be sent to? [facility] [employer], [place]  3. Do they need a 30 day or 90 day supply? #[phone]

## 2020-10-24 MED ORDER — TORSEMIDE 20 MG PO TABS
40.0000 mg | ORAL_TABLET | Freq: Two times a day (BID) | ORAL | 1 refills | Status: DC
Start: 2020-10-24 — End: 2021-03-11

## 2020-11-13 ENCOUNTER — Ambulatory Visit (INDEPENDENT_AMBULATORY_CARE_PROVIDER_SITE_OTHER): Payer: Medicaid Other

## 2020-11-13 ENCOUNTER — Ambulatory Visit (HOSPITAL_COMMUNITY)
Admission: EM | Admit: 2020-11-13 | Discharge: 2020-11-13 | Disposition: A | Discharge status: Home / Self Care | Payer: Medicaid Other | Attending: Family Medicine | Admitting: Family Medicine

## 2020-11-13 DIAGNOSIS — R2231 Localized swelling, mass and lump, right upper limb: Secondary | ICD-10-CM | POA: Diagnosis not present

## 2020-11-13 DIAGNOSIS — M67441 Ganglion, right hand: Secondary | ICD-10-CM | POA: Diagnosis not present

## 2020-11-13 NOTE — ED Triage Notes (Signed)
Pt here for poss cyst on right hand onset 2 weeks associated w/pain  Denies inj/trauma  BP today is 157/105 but she sts she has not had her BP this am.   A&O x4... NAD.Marland Kitchen. ambulatory

## 2020-11-13 NOTE — ED Provider Notes (Signed)
[facility]    CSN: [id number] Arrival date & time: 11/13/20  1127      History   Chief Complaint Chief Complaint  Patient presents with  . Hand Pain    HPI Kathryn Mahoney is a 60 y.o. female presenting with right hand swelling and discomfort for 2 weeks. History asthma, COPD, hypertension, morbid obesity, CHF. Denies trauma. States she first noticed swelling on top of right hand 2 weeks ago. Since then, the swelling has appeared unchanged, but she noted some discomfort with flexion of wrist. She is right handed. Denies trauma. Denies fevers, chills, shortness of breath, chest pain. Pt denies URI sx stating she has history of COPD.  HPI  Past Medical History:  Diagnosis Date  . Asthma   . B12 deficiency   . CHF (congestive heart failure) ([place])   . COPD (chronic obstructive pulmonary disease) ([place])   . Diabetes mellitus without complication ([place])   . Hypertension   . Morbid obesity ([place])   . SOB (shortness of breath)   . Urinary incontinence     Patient Active Problem List   Diagnosis Date Noted  . CHF exacerbation ([place]) 09/03/2020  . Type II or unspecified type diabetes mellitus without mention of complication, not stated as uncontrolled 11/07/2013  . Hyperglycemia 11/06/2013  . COPD exacerbation ([place]) 11/02/2013  . PNA (pneumonia) 11/02/2013  . Hypoxia 11/02/2013  . Acute-on-chronic respiratory failure ([place]) 11/02/2013  . COPD (chronic obstructive pulmonary disease) ([place])   . CHF (congestive heart failure) ([place])   . Urinary incontinence   . Hypertension     Past Surgical History:  Procedure Laterality Date  . CESAREAN SECTION    . ECTOPIC PREGNANCY SURGERY    . KNEE SURGERY      OB History    Gravida  5   Para  4   Term  4   Preterm      AB  1   Living  4     SAB      IAB      Ectopic  1   Multiple      Live Births               Home Medications    Prior to Admission medications   Medication Sig Start Date End Date  Taking? Authorizing Provider  acetaminophen (TYLENOL) 650 MG CR tablet Take 650 mg by mouth every 8 (eight) hours as needed for pain.    [provider]  albuterol (PROVENTIL) (2.5 MG/3ML) 0.083% nebulizer solution Take 3 mLs (2.5 mg total) by nebulization every 6 (six) hours as needed for wheezing or shortness of breath. 03/21/17   Robinson, [place] N, PA-C  albuterol (VENTOLIN HFA) 108 (90 Base) MCG/ACT inhaler Inhale 2 puffs into the lungs every 6 (six) hours as needed for wheezing or shortness of breath. 11/05/13   [provider]  cholecalciferol (VITAMIN D3) 25 MCG (1000 UNIT) tablet Take 1,000 Units by mouth daily.    [provider]  cromolyn (OPTICROM) 4 % ophthalmic solution Place 1 drop into both eyes in the morning, at noon, in the evening, and at bedtime.    [provider]  glipiZIDE (GLUCOTROL XL) 5 MG 24 hr tablet Take 5 mg by mouth every evening.  08/24/20   [provider]  GUAIFENESIN PO Take 1 tablet by mouth daily as needed (CONGESTION).    [provider]  hydrocortisone 2.5 % cream Apply 1 application topically 3 (three) times daily  as needed for itching.    [provider]  OXYGEN Inhale 2 L into the lungs as needed (shortness of breath).    [provider]  Potassium 99 MG TABS Take 99 mg by mouth daily.    [provider]  spironolactone (ALDACTONE) 25 MG tablet Take 25 mg by mouth daily. Patient not taking: Reported on 09/17/2020 10/31/19   [provider]  SYMBICORT 80-4.5 MCG/ACT inhaler Inhale 2 puffs into the lungs 2 (two) times daily. 11/07/17   [provider]  torsemide (DEMADEX) 20 MG tablet Take 2 tablets (40 mg total) by mouth 2 (two) times daily. 10/24/20   Buford Dresser, MD  traMADol (ULTRAM) 50 MG tablet Take 1 tablet (50 mg total) by mouth every 12 (twelve) hours as needed. 09/06/20   Bonnielee Haff, MD    Family History Family History  Problem Relation  Age of Onset  . Stroke Mother   . Asthma Mother     Social History Social History   Tobacco Use  . Smoking status: Former Smoker    Packs/day: 1.50    Years: 30.00    Pack years: 45.00    Types: Cigarettes    Quit date: 10/27/2010    Years since quitting: 10.0  . Smokeless tobacco: Never Used  Substance Use Topics  . Alcohol use: No  . Drug use: No     Allergies   Contrast media [iodinated diagnostic agents], Empagliflozin, Losartan, Lisinopril, Metformin, and Ondansetron   Review of Systems Review of Systems  Musculoskeletal:       Right hand swelling and discomfort.  All other systems reviewed and are negative.    Physical Exam Triage Vital Signs ED Triage Vitals  Enc Vitals Group     BP 11/13/20 1218 (!) 157/105     Pulse Rate 11/13/20 1218 89     Resp 11/13/20 1218 14     Temp 11/13/20 1218 98.1 F (36.7 C)     Temp Source 11/13/20 1218 Oral     SpO2 11/13/20 1218 98 %     Weight --      Height --      Head Circumference --      Peak Flow --      Pain Score 11/13/20 1220 8     Pain Loc --      Pain Edu? --      Excl. in [place]? --    No data found.  Updated Vital Signs BP (!) 157/105 (BP Location: Left Wrist)   Pulse 89   Temp 98.1 F (36.7 C) (Oral)   Resp 14   SpO2 98%   Visual Acuity Right Eye Distance:   Left Eye Distance:   Bilateral Distance:    Right Eye Near:   Left Eye Near:    Bilateral Near:     Physical Exam Vitals reviewed.  Constitutional:      Appearance: Normal appearance.  Cardiovascular:     Rate and Rhythm: Normal rate and regular rhythm.     Heart sounds: Normal heart sounds.  Pulmonary:     Effort: Pulmonary effort is normal.     Breath sounds: Normal breath sounds.  Musculoskeletal:     Comments: Dorsum R hand with 2cm x 2cm area of swelling and mild tenderness overlying radiocarpal joint. ROM intact but discomfort with flexion.  Neurological:     General: No focal deficit present.     Mental Status: She is  alert and oriented to person,  place, and time.  Psychiatric:        Attention and Perception: Attention and perception normal.        Mood and Affect: Affect normal. Mood is anxious.      UC Treatments / Results  Labs (all labs ordered are listed, but only abnormal results are displayed) Labs Reviewed - No data to display  EKG   Radiology DG Hand Complete Right  Result Date: 11/13/2020 CLINICAL DATA:  Dorsal right hand swelling and tenderness. Possible cyst. EXAM: RIGHT HAND - COMPLETE 3+ VIEW COMPARISON:  None. FINDINGS: There is no evidence of fracture or dislocation. Mild degenerative changes throughout the hand most pronounced within the interphalangeal joints as manifested by joint space narrowing and marginal osteophyte formation. No focal erosion. Mild soft tissue swelling over the dorsum of the hand. IMPRESSION: No acute osseous abnormality. Mild soft tissue swelling over the dorsum of the hand. Electronically Signed   By: Davina Poke D.O.   On: 11/13/2020 13:29    Procedures Procedures (including critical care time)  Medications Ordered in UC Medications - No data to display  Initial Impression / Assessment and Plan / UC Course  I have reviewed the triage vital signs and the nursing notes.  Pertinent labs & imaging results that were available during my care of the patient were reviewed by me and considered in my medical decision making (see chart for details).     Xray R hand with No acute osseous abnormality. Mild soft tissue swelling over the dorsum of the hand.  Ace wrap applied today. Continue to use this at home. Ibuprofen for discomfort and inflammation. RICE.  F/u with hand specialist if symptoms worsen/persist.  Spent over 30 minutes obtaining H&P, performing physical, discussing results, interpreting films, treatment plan and plan for follow-up with patient. Patient agrees with plan.    Final Clinical Impressions(s) / UC Diagnoses   Final diagnoses:   Ganglion cyst of tendon sheath of right hand     Discharge Instructions     -Compression with ace wrap  -Follow-up with hand specialist (information provided below) if you're interested in discussing further management or removal    ED Prescriptions    None     PDMP not reviewed this encounter.   Hazel Sams, PA-C 11/13/20 1714

## 2020-11-13 NOTE — Discharge Instructions (Addendum)
-  Compression with ace wrap  -Follow-up with hand specialist (information provided below) if you're interested in discussing further management or removal

## 2020-12-10 ENCOUNTER — Ambulatory Visit: Payer: Medicaid Other | Admitting: Cardiology

## 2021-01-06 ENCOUNTER — Encounter: Payer: Self-pay | Admitting: Cardiology

## 2021-01-06 DIAGNOSIS — I5032 Chronic diastolic (congestive) heart failure: Secondary | ICD-10-CM | POA: Insufficient documentation

## 2021-01-06 DIAGNOSIS — I340 Nonrheumatic mitral (valve) insufficiency: Secondary | ICD-10-CM | POA: Insufficient documentation

## 2021-01-06 DIAGNOSIS — E669 Obesity, unspecified: Secondary | ICD-10-CM | POA: Insufficient documentation

## 2021-01-06 DIAGNOSIS — E1169 Type 2 diabetes mellitus with other specified complication: Secondary | ICD-10-CM | POA: Insufficient documentation

## 2021-01-06 DIAGNOSIS — I5033 Acute on chronic diastolic (congestive) heart failure: Secondary | ICD-10-CM | POA: Insufficient documentation

## 2021-03-11 ENCOUNTER — Other Ambulatory Visit: Payer: Self-pay | Admitting: Cardiology

## 2021-07-22 ENCOUNTER — Encounter (HOSPITAL_COMMUNITY): Payer: Self-pay

## 2021-07-22 ENCOUNTER — Inpatient Hospital Stay (HOSPITAL_COMMUNITY)
Admission: EM | Admit: 2021-07-22 | Discharge: 2021-07-25 | DRG: 291 | Disposition: A | Discharge status: Home / Self Care | Payer: Medicaid Other | Attending: Internal Medicine | Admitting: Internal Medicine

## 2021-07-22 ENCOUNTER — Other Ambulatory Visit: Payer: Self-pay

## 2021-07-22 ENCOUNTER — Emergency Department (HOSPITAL_COMMUNITY): Payer: Medicaid Other

## 2021-07-22 ENCOUNTER — Inpatient Hospital Stay (HOSPITAL_COMMUNITY): Payer: Medicaid Other

## 2021-07-22 DIAGNOSIS — Z23 Encounter for immunization: Secondary | ICD-10-CM | POA: Diagnosis not present

## 2021-07-22 DIAGNOSIS — Z9119 Patient's noncompliance with other medical treatment and regimen: Secondary | ICD-10-CM

## 2021-07-22 DIAGNOSIS — J449 Chronic obstructive pulmonary disease, unspecified: Secondary | ICD-10-CM | POA: Diagnosis present

## 2021-07-22 DIAGNOSIS — Z87891 Personal history of nicotine dependence: Secondary | ICD-10-CM | POA: Diagnosis not present

## 2021-07-22 DIAGNOSIS — Z9981 Dependence on supplemental oxygen: Secondary | ICD-10-CM

## 2021-07-22 DIAGNOSIS — Z20822 Contact with and (suspected) exposure to covid-19: Secondary | ICD-10-CM | POA: Diagnosis present

## 2021-07-22 DIAGNOSIS — Z7984 Long term (current) use of oral hypoglycemic drugs: Secondary | ICD-10-CM

## 2021-07-22 DIAGNOSIS — Z79899 Other long term (current) drug therapy: Secondary | ICD-10-CM | POA: Diagnosis not present

## 2021-07-22 DIAGNOSIS — I110 Hypertensive heart disease with heart failure: Secondary | ICD-10-CM | POA: Diagnosis not present

## 2021-07-22 DIAGNOSIS — I10 Essential (primary) hypertension: Secondary | ICD-10-CM | POA: Diagnosis not present

## 2021-07-22 DIAGNOSIS — J9601 Acute respiratory failure with hypoxia: Secondary | ICD-10-CM | POA: Diagnosis present

## 2021-07-22 DIAGNOSIS — X58XXXA Exposure to other specified factors, initial encounter: Secondary | ICD-10-CM | POA: Diagnosis present

## 2021-07-22 DIAGNOSIS — Z7951 Long term (current) use of inhaled steroids: Secondary | ICD-10-CM | POA: Diagnosis not present

## 2021-07-22 DIAGNOSIS — R0602 Shortness of breath: Secondary | ICD-10-CM | POA: Diagnosis not present

## 2021-07-22 DIAGNOSIS — I5033 Acute on chronic diastolic (congestive) heart failure: Secondary | ICD-10-CM | POA: Diagnosis present

## 2021-07-22 DIAGNOSIS — Z825 Family history of asthma and other chronic lower respiratory diseases: Secondary | ICD-10-CM | POA: Diagnosis not present

## 2021-07-22 DIAGNOSIS — R32 Unspecified urinary incontinence: Secondary | ICD-10-CM | POA: Diagnosis present

## 2021-07-22 DIAGNOSIS — Z888 Allergy status to other drugs, medicaments and biological substances status: Secondary | ICD-10-CM

## 2021-07-22 DIAGNOSIS — T501X6A Underdosing of loop [high-ceiling] diuretics, initial encounter: Secondary | ICD-10-CM | POA: Diagnosis present

## 2021-07-22 DIAGNOSIS — I161 Hypertensive emergency: Secondary | ICD-10-CM | POA: Diagnosis present

## 2021-07-22 DIAGNOSIS — Z6841 Body Mass Index (BMI) 40.0 and over, adult: Secondary | ICD-10-CM | POA: Diagnosis not present

## 2021-07-22 DIAGNOSIS — I272 Pulmonary hypertension, unspecified: Secondary | ICD-10-CM | POA: Diagnosis present

## 2021-07-22 DIAGNOSIS — Z91041 Radiographic dye allergy status: Secondary | ICD-10-CM | POA: Diagnosis not present

## 2021-07-22 DIAGNOSIS — I509 Heart failure, unspecified: Secondary | ICD-10-CM | POA: Diagnosis not present

## 2021-07-22 DIAGNOSIS — R9431 Abnormal electrocardiogram [ECG] [EKG]: Secondary | ICD-10-CM | POA: Diagnosis present

## 2021-07-22 DIAGNOSIS — E1165 Type 2 diabetes mellitus with hyperglycemia: Secondary | ICD-10-CM | POA: Diagnosis present

## 2021-07-22 DIAGNOSIS — Z5329 Procedure and treatment not carried out because of patient's decision for other reasons: Secondary | ICD-10-CM | POA: Diagnosis not present

## 2021-07-22 DIAGNOSIS — R0601 Orthopnea: Secondary | ICD-10-CM

## 2021-07-22 DIAGNOSIS — G4733 Obstructive sleep apnea (adult) (pediatric): Secondary | ICD-10-CM | POA: Diagnosis present

## 2021-07-22 DIAGNOSIS — D638 Anemia in other chronic diseases classified elsewhere: Secondary | ICD-10-CM | POA: Diagnosis present

## 2021-07-22 DIAGNOSIS — Z9114 Patient's other noncompliance with medication regimen: Secondary | ICD-10-CM

## 2021-07-22 DIAGNOSIS — Z823 Family history of stroke: Secondary | ICD-10-CM

## 2021-07-22 DIAGNOSIS — IMO0002 Reserved for concepts with insufficient information to code with codable children: Secondary | ICD-10-CM

## 2021-07-22 LAB — COMPREHENSIVE METABOLIC PANEL
ALT: 22 U/L (ref 0–44)
AST: 16 U/L (ref 15–41)
Albumin: 3.3 g/dL — ABNORMAL LOW (ref 3.5–5.0)
Alkaline Phosphatase: 101 U/L (ref 38–126)
Anion gap: 7 (ref 5–15)
BUN: 17 mg/dL (ref 8–23)
CO2: 29 mmol/L (ref 22–32)
Calcium: 9.2 mg/dL (ref 8.9–10.3)
Chloride: 104 mmol/L (ref 98–111)
Creatinine, Ser: 1.02 mg/dL — ABNORMAL HIGH (ref 0.44–1.00)
GFR, Estimated: >60 mL/min (ref >60)
Glucose, Bld: 176 mg/dL — ABNORMAL HIGH (ref 70–99)
Potassium: 4 mmol/L (ref 3.5–5.1)
Sodium: 140 mmol/L (ref 135–145)
Total Bilirubin: 0.4 mg/dL (ref 0.3–1.2)
Total Protein: 7.6 g/dL (ref 6.5–8.1)

## 2021-07-22 LAB — URINALYSIS, ROUTINE W REFLEX MICROSCOPIC
Bacteria, UA: NONE SEEN
Bilirubin Urine: NEGATIVE
Glucose, UA: NEGATIVE mg/dL
Ketones, ur: NEGATIVE mg/dL
Leukocytes,Ua: NEGATIVE
Nitrite: NEGATIVE
Protein, ur: NEGATIVE mg/dL
Specific Gravity, Urine: <=1.005 (ref 1.005–1.030)
pH: 6 (ref 5.0–8.0)

## 2021-07-22 LAB — CBC WITH DIFFERENTIAL/PLATELET
Abs Immature Granulocytes: 0.06 10*3/uL (ref 0.00–0.07)
Basophils Absolute: 0 10*3/uL (ref 0.0–0.1)
Basophils Relative: 0 %
Eosinophils Absolute: 0.3 10*3/uL (ref 0.0–0.5)
Eosinophils Relative: 3 %
HCT: 36.3 % (ref 36.0–46.0)
Hemoglobin: 10.9 g/dL — ABNORMAL LOW (ref 12.0–15.0)
Immature Granulocytes: 1 %
Lymphocytes Relative: 30 %
Lymphs Abs: 3.6 10*3/uL (ref 0.7–4.0)
MCH: 25.9 pg — ABNORMAL LOW (ref 26.0–34.0)
MCHC: 30 g/dL (ref 30.0–36.0)
MCV: 86.2 fL (ref 80.0–100.0)
Monocytes Absolute: 0.5 10*3/uL (ref 0.1–1.0)
Monocytes Relative: 4 %
Neutro Abs: 7.4 10*3/uL (ref 1.7–7.7)
Neutrophils Relative %: 62 %
Platelets: 433 10*3/uL — ABNORMAL HIGH (ref 150–400)
RBC: 4.21 MIL/uL (ref 3.87–5.11)
RDW: 16.1 % — ABNORMAL HIGH (ref 11.5–15.5)
WBC: 11.8 10*3/uL — ABNORMAL HIGH (ref 4.0–10.5)
nRBC: 0 % (ref 0.0–0.2)

## 2021-07-22 LAB — GLUCOSE, CAPILLARY
Glucose-Capillary: 150 mg/dL — ABNORMAL HIGH (ref 70–99)
Glucose-Capillary: 199 mg/dL — ABNORMAL HIGH (ref 70–99)
Glucose-Capillary: 197 mg/dL — ABNORMAL HIGH (ref 70–99)
Glucose-Capillary: 198 mg/dL — ABNORMAL HIGH (ref 70–99)

## 2021-07-22 LAB — TROPONIN I (HIGH SENSITIVITY)
Troponin I (High Sensitivity): 12 ng/L (ref <18)
Troponin I (High Sensitivity): 12 ng/L (ref <18)

## 2021-07-22 LAB — HEMOGLOBIN A1C
Hgb A1c MFr Bld: 8.6 % — ABNORMAL HIGH (ref 4.8–5.6)
Mean Plasma Glucose: 200.12 mg/dL

## 2021-07-22 LAB — ECHOCARDIOGRAM COMPLETE
Area-P 1/2: 5.13 cm2
Calc EF: 53.5 %
Height: 69 in
MV M vel: 5.99 m/s
MV Peak grad: 143.5 mmHg
MV VTI: 2.36 cm2
Radius: 0.45 cm
S' Lateral: 3.6 cm
Single Plane A2C EF: 62.9 %
Single Plane A4C EF: 41.8 %
Weight: 4984.16 oz

## 2021-07-22 LAB — SARS CORONAVIRUS 2 (TAT 6-24 HRS): SARS Coronavirus 2: NEGATIVE

## 2021-07-22 LAB — BRAIN NATRIURETIC PEPTIDE: B Natriuretic Peptide: 42.8 pg/mL (ref 0.0–100.0)

## 2021-07-22 MED ORDER — INSULIN ASPART 100 UNIT/ML IJ SOLN
0.0000 [IU] | Freq: Three times a day (TID) | INTRAMUSCULAR | Status: DC
  Administered 2021-07-22 – 2021-07-23 (×4): 2 [IU] via SUBCUTANEOUS
  Administered 2021-07-23: 1 [IU] via SUBCUTANEOUS
  Administered 2021-07-24 – 2021-07-25 (×4): 2 [IU] via SUBCUTANEOUS

## 2021-07-22 MED ORDER — MOMETASONE FURO-FORMOTEROL FUM 100-5 MCG/ACT IN AERO
2.0000 | INHALATION_SPRAY | Freq: Two times a day (BID) | RESPIRATORY_TRACT | Status: DC
  Administered 2021-07-22 – 2021-07-25 (×7): 2 via RESPIRATORY_TRACT
  Filled 2021-07-22: qty 8.8

## 2021-07-22 MED ORDER — ENOXAPARIN SODIUM 80 MG/0.8ML IJ SOSY
70.0000 mg | PREFILLED_SYRINGE | INTRAMUSCULAR | Status: DC
  Administered 2021-07-22 – 2021-07-25 (×4): 70 mg via SUBCUTANEOUS
  Filled 2021-07-22 (×4): qty 0.8

## 2021-07-22 MED ORDER — INFLUENZA VAC SPLIT QUAD 0.5 ML IM SUSY
0.5000 mL | PREFILLED_SYRINGE | INTRAMUSCULAR | Status: AC
  Administered 2021-07-23: 0.5 mL via INTRAMUSCULAR
  Filled 2021-07-22: qty 0.5

## 2021-07-22 MED ORDER — FUROSEMIDE 10 MG/ML IJ SOLN
40.0000 mg | Freq: Once | INTRAMUSCULAR | Status: AC
  Administered 2021-07-22: 40 mg via INTRAVENOUS
  Filled 2021-07-22: qty 4

## 2021-07-22 MED ORDER — ACETAMINOPHEN 650 MG RE SUPP: 650.0000 mg | Freq: Four times a day (QID) | RECTAL | Status: DC | PRN

## 2021-07-22 MED ORDER — ACETAMINOPHEN 325 MG PO TABS
650.0000 mg | ORAL_TABLET | Freq: Four times a day (QID) | ORAL | Status: DC | PRN
  Administered 2021-07-22 – 2021-07-25 (×7): 650 mg via ORAL
  Filled 2021-07-22 (×8): qty 2

## 2021-07-22 MED ORDER — SACUBITRIL-VALSARTAN 24-26 MG PO TABS
1.0000 | ORAL_TABLET | Freq: Two times a day (BID) | ORAL | Status: DC
  Administered 2021-07-22 – 2021-07-25 (×7): 1 via ORAL
  Filled 2021-07-22 (×7): qty 1

## 2021-07-22 MED ORDER — SODIUM CHLORIDE 0.9% FLUSH
3.0000 mL | Freq: Two times a day (BID) | INTRAVENOUS | Status: DC
  Administered 2021-07-22 – 2021-07-25 (×7): 3 mL via INTRAVENOUS

## 2021-07-22 MED ORDER — SODIUM CHLORIDE 0.9% FLUSH: 3.0000 mL | INTRAVENOUS | Status: DC | PRN

## 2021-07-22 MED ORDER — AMLODIPINE BESYLATE 10 MG PO TABS
10.0000 mg | ORAL_TABLET | Freq: Every day | ORAL | Status: DC
  Administered 2021-07-22 – 2021-07-25 (×4): 10 mg via ORAL
  Filled 2021-07-22 (×4): qty 1

## 2021-07-22 MED ORDER — INSULIN ASPART 100 UNIT/ML IJ SOLN: 0.0000 [IU] | Freq: Every day | INTRAMUSCULAR | Status: DC

## 2021-07-22 MED ORDER — ALBUTEROL SULFATE HFA 108 (90 BASE) MCG/ACT IN AERS: 2.0000 | INHALATION_SPRAY | RESPIRATORY_TRACT | Status: DC | PRN

## 2021-07-22 MED ORDER — TRAZODONE HCL 50 MG PO TABS
50.0000 mg | ORAL_TABLET | Freq: Every evening | ORAL | Status: DC | PRN
  Administered 2021-07-22 – 2021-07-24 (×2): 50 mg via ORAL
  Filled 2021-07-22 (×2): qty 1

## 2021-07-22 MED ORDER — SODIUM CHLORIDE 0.9 % IV SOLN: 250.0000 mL | INTRAVENOUS | Status: DC | PRN

## 2021-07-22 MED ORDER — FUROSEMIDE 10 MG/ML IJ SOLN
40.0000 mg | Freq: Two times a day (BID) | INTRAMUSCULAR | Status: AC
Start: 2021-07-22 — End: 2021-07-23
  Administered 2021-07-22 – 2021-07-23 (×3): 40 mg via INTRAVENOUS
  Filled 2021-07-22 (×3): qty 4

## 2021-07-22 MED ORDER — CARVEDILOL 6.25 MG PO TABS
6.2500 mg | ORAL_TABLET | Freq: Two times a day (BID) | ORAL | Status: DC
  Administered 2021-07-22 – 2021-07-25 (×7): 6.25 mg via ORAL
  Filled 2021-07-22 (×7): qty 1

## 2021-07-22 NOTE — ED Provider Notes (Signed)
[facility] DEPT Provider Note   CSN: [id number] Arrival date & time: 07/22/21  0200     History Chief Complaint  Patient presents with   Shortness of Breath   Chest Pain    Kathryn Mahoney is a 60 y.o. female.  Patient is a 60 year old female with past medical history of diabetes, congestive heart failure, COPD, obesity.  She presents today for evaluation of shortness of breath.  This has been worsening over the past 3 days.  Patient normally takes Lasix, but tells me she misplaced the bottle and has been off of this medication for the past 3 days.  She describes dyspnea with exertion.  She describes some cough that is nonproductive.  She denies any fevers or chills.  She denies ill contacts.  The history is provided by the patient.  Shortness of Breath Severity:  Moderate Onset quality:  Gradual Duration:  3 days Timing:  Constant Progression:  Worsening Chronicity:  New Relieved by:  Nothing Worsened by:  Exertion Associated symptoms: chest pain   Chest Pain Associated symptoms: shortness of breath       Past Medical History:  Diagnosis Date   Asthma    B12 deficiency    CHF (congestive heart failure) (HCC)    COPD (chronic obstructive pulmonary disease) (HCC)    Diabetes mellitus without complication (HCC)    Hypertension    Morbid obesity (HCC)    SOB (shortness of breath)    Urinary incontinence     Patient Active Problem List   Diagnosis Date Noted   Chronic diastolic heart failure ([place]) 01/06/2021   Nonrheumatic mitral valve regurgitation 01/06/2021   Diabetes mellitus type 2 in obese ([place]) 01/06/2021   CHF exacerbation ([place]) 09/03/2020   Type II or unspecified type diabetes mellitus without mention of complication, not stated as uncontrolled 11/07/2013   Hyperglycemia 11/06/2013   COPD exacerbation ([place]) 11/02/2013   PNA (pneumonia) 11/02/2013   Hypoxia 11/02/2013   Acute-on-chronic respiratory failure ([place]) 11/02/2013    COPD (chronic obstructive pulmonary disease) (HCC)    CHF (congestive heart failure) (HCC)    Urinary incontinence    Hypertension     Past Surgical History:  Procedure Laterality Date   CESAREAN SECTION     ECTOPIC PREGNANCY SURGERY     KNEE SURGERY       OB History     Gravida  5   Para  4   Term  4   Preterm      AB  1   Living  4      SAB      IAB      Ectopic  1   Multiple      Live Births              Family History  Problem Relation Age of Onset   Stroke Mother    Asthma Mother     Social History   Tobacco Use   Smoking status: Former    Packs/day: 1.50    Years: 30.00    Pack years: 45.00    Types: Cigarettes    Quit date: 10/27/2010    Years since quitting: 10.7   Smokeless tobacco: Never  Substance Use Topics   Alcohol use: No   Drug use: No    Home Medications Prior to Admission medications   Medication Sig Start Date End Date Taking? Authorizing Provider  acetaminophen (TYLENOL) 650 MG CR tablet Take 650 mg by mouth every 8 (  eight) hours as needed for pain.    [provider]  albuterol (PROVENTIL) (2.5 MG/3ML) 0.083% nebulizer solution Take 3 mLs (2.5 mg total) by nebulization every 6 (six) hours as needed for wheezing or shortness of breath. 03/21/17   Robinson, [place] N, PA-C  albuterol (VENTOLIN HFA) 108 (90 Base) MCG/ACT inhaler Inhale 2 puffs into the lungs every 6 (six) hours as needed for wheezing or shortness of breath. 11/05/13   [provider]  cholecalciferol (VITAMIN D3) 25 MCG (1000 UNIT) tablet Take 1,000 Units by mouth daily.    [provider]  cromolyn (OPTICROM) 4 % ophthalmic solution Place 1 drop into both eyes in the morning, at noon, in the evening, and at bedtime.    [provider]  glipiZIDE (GLUCOTROL XL) 5 MG 24 hr tablet Take 5 mg by mouth every evening.  08/24/20   [provider]  GUAIFENESIN PO Take 1 tablet by mouth daily as needed (CONGESTION).     [provider]  hydrocortisone 2.5 % cream Apply 1 application topically 3 (three) times daily as needed for itching.    [provider]  OXYGEN Inhale 2 L into the lungs as needed (shortness of breath).    [provider]  Potassium 99 MG TABS Take 99 mg by mouth daily.    [provider]  spironolactone (ALDACTONE) 25 MG tablet Take 25 mg by mouth daily. Patient not taking: Reported on 09/17/2020 10/31/19   [provider]  SYMBICORT 80-4.5 MCG/ACT inhaler Inhale 2 puffs into the lungs 2 (two) times daily. 11/07/17   [provider]  torsemide (DEMADEX) 20 MG tablet TAKE 2 TABLETS(40 MG) BY MOUTH TWICE DAILY 03/11/21   Buford Dresser, MD  traMADol (ULTRAM) 50 MG tablet Take 1 tablet (50 mg total) by mouth every 12 (twelve) hours as needed. 09/06/20   Bonnielee Haff, MD    Allergies    Contrast media [iodinated diagnostic agents], Empagliflozin, Losartan, Lisinopril, Metformin, and Ondansetron  Review of Systems   Review of Systems  Respiratory:  Positive for shortness of breath.   Cardiovascular:  Positive for chest pain.  All other systems reviewed and are negative.  Physical Exam Updated Vital Signs BP (!) 177/107   Pulse 100   Temp 98 F (36.7 C) (Oral)   Resp (!) 21   Ht 5\' 9"  (1.753 m)   Wt (!) 145.2 kg   SpO2 99%   BMI 47.26 kg/m   Physical Exam Vitals and nursing note reviewed.  Constitutional:      General: She is not in acute distress.    Appearance: She is well-developed. She is not diaphoretic.  HENT:     Head: Normocephalic and atraumatic.  Cardiovascular:     Rate and Rhythm: Normal rate and regular rhythm.     Heart sounds: No murmur heard.   No friction rub. No gallop.  Pulmonary:     Effort: Pulmonary effort is normal. No respiratory distress.     Breath sounds: Examination of the right-lower field reveals rales. Examination of the left-lower field reveals rales. Rales present. No wheezing.   Abdominal:     General: Bowel sounds are normal. There is no distension.     Palpations: Abdomen is soft.     Tenderness: There is no abdominal tenderness.  Musculoskeletal:        General: Normal range of motion.     Cervical back: Normal range of motion and neck supple.     Right  lower leg: Edema present.     Left lower leg: Edema present.     Comments: There is trace pitting edema of both lower extremities.  Skin:    General: Skin is warm and dry.  Neurological:     General: No focal deficit present.     Mental Status: She is alert and oriented to person, place, and time.    ED Results / Procedures / Treatments   Labs (all labs ordered are listed, but only abnormal results are displayed) Labs Reviewed  CBC WITH DIFFERENTIAL/PLATELET - Abnormal; Notable for the following components:      Result Value   WBC 11.8 (*)    Hemoglobin 10.9 (*)    MCH 25.9 (*)    RDW 16.1 (*)    Platelets 433 (*)    All other components within normal limits  COMPREHENSIVE METABOLIC PANEL - Abnormal; Notable for the following components:   Glucose, Bld 176 (*)    Creatinine, Ser 1.02 (*)    Albumin 3.3 (*)    All other components within normal limits  URINALYSIS, ROUTINE W REFLEX MICROSCOPIC - Abnormal; Notable for the following components:   Color, Urine YELLOW (*)    APPearance CLEAR (*)    Hgb urine dipstick TRACE (*)    All other components within normal limits  BRAIN NATRIURETIC PEPTIDE  TROPONIN I (HIGH SENSITIVITY)    EKG EKG Interpretation  Date/Time:  Monday July 22 2021 02:13:06 EDT Ventricular Rate:  104 PR Interval:  158 QRS Duration: 117 QT Interval:  364 QTC Calculation: 479 R Axis:   69 Text Interpretation: Sinus tachycardia Consider left atrial enlargement Nonspecific intraventricular conduction delay Confirmed by Veryl Speak [phone] on 07/22/2021 2:17:00 AM  Radiology No results found.  Procedures Procedures   Medications Ordered in ED Medications   furosemide (LASIX) injection 40 mg (has no administration in time range)    ED Course  I have reviewed the triage vital signs and the nursing notes.  Pertinent labs & imaging results that were available during my care of the patient were reviewed by me and considered in my medical decision making (see chart for details).    MDM Rules/Calculators/A&P  Patient is a 60 year old female with history of congestive heart failure on home oxygen presenting with complaints of shortness of breath and dyspnea on exertion.  Patient states that she becomes dyspneic with even taking a few steps.  She has been off of her Lasix for the past several days after she misplaced her medications.  Patient given IV Lasix here with good diuresis.  Her work-up shows cardiomegaly with vascular congestion on chest x-ray, but normal BNP.  Her laboratory studies are otherwise unremarkable.  EKG is unchanged and troponin is negative.  Patient continues to be dyspneic with ambulation to the bathroom.  She has been admitted in the past when she has felt better than she feels now.  She feels as though she requires hospitalization for further diuresis.  Care discussed with Dr. Tonie Griffith who agrees to admit.  Final Clinical Impression(s) / ED Diagnoses Final diagnoses:  None    Rx / DC Orders ED Discharge Orders     None        Veryl Speak, MD 07/22/21 [phone]

## 2021-07-22 NOTE — ED Triage Notes (Signed)
Pt reports having a hx of CHF. Pt states that she hasn't taken her Lasix 40 mg daily in a few days. Pt complains of shortness of breath.

## 2021-07-22 NOTE — Progress Notes (Signed)
Pt seen and examined this am.   She was admitted earlier this by Dr Tonie Griffith , please see note for detailed H&P.    60 year old lady prior history of diastolic heart failure, type 2 diabetes, hypertension, COPD, obstructive sleep apnea on CPAP, morbid obesity presents for evaluation of shortness of breath.  She reports that she was on torsemide and she stopped taking it as she started having hives.  She was admitted for evaluation and management of acute on chronic diastolic heart failure.    She was started on IV Lasix 40 mg twice daily, continue the same. Repeat echocardiogram ordered and pending.   Hosie Poisson, MD

## 2021-07-22 NOTE — Plan of Care (Signed)
  Problem: Education: Goal: Knowledge of General Education information will improve Description: Including pain rating scale, medication(s)/side effects and non-pharmacologic comfort measures 07/22/2021 0704 by Glenna Fellows D, RN Outcome: Progressing 07/22/2021 0649 by Glenna Fellows D, RN Outcome: Progressing   Problem: Clinical Measurements: Goal: Ability to maintain clinical measurements within normal limits will improve 07/22/2021 0704 by Glenna Fellows D, RN Outcome: Progressing 07/22/2021 0649 by Glenna Fellows D, RN Outcome: Progressing Goal: Diagnostic test results will improve 07/22/2021 0704 by Glenna Fellows D, RN Outcome: Progressing 07/22/2021 0649 by Tula Nakayama, RN Outcome: Progressing   Problem: Activity: Goal: Risk for activity intolerance will decrease 07/22/2021 0704 by Glenna Fellows D, RN Outcome: Progressing 07/22/2021 0649 by Tula Nakayama, RN Outcome: Progressing   Problem: Pain Managment: Goal: General experience of comfort will improve 07/22/2021 0704 by Tula Nakayama, RN Outcome: Progressing 07/22/2021 0649 by Glenna Fellows D, RN Outcome: Progressing   Problem: Safety: Goal: Ability to remain free from injury will improve 07/22/2021 0704 by Tula Nakayama, RN Outcome: Progressing 07/22/2021 0649 by Tula Nakayama, RN Outcome: Progressing   Problem: Education: Goal: Ability to demonstrate management of disease process will improve Outcome: Progressing Goal: Ability to verbalize understanding of medication therapies will improve Outcome: Progressing Goal: Individualized Educational Video(s) Outcome: Progressing   Problem: Activity: Goal: Capacity to carry out activities will improve Outcome: Progressing   Problem: Cardiac: Goal: Ability to achieve and maintain adequate cardiopulmonary perfusion will improve Outcome: Progressing

## 2021-07-22 NOTE — H&P (Signed)
History and Physical    Dorota Heinrichs XYI:[id number] DOB: February 12, 1961 DOA: 07/22/2021  [facility]P: [facility] [place]   Patient coming from: Home  Chief Complaint: SOB  HPI: Kathryn Mahoney is a 60 y.o. female with medical history significant for HFpEF, DMT2, HTN, COPD, OSA, morbid obesity who presents for evaluation of shortness of breath.  She reports she has been having shortness of breath for the last week but has been worse in the last 2 to 3 days.  She states he gets very short of breath even walking across her room and she has to stop and rest after only a few steps.  She reports has been having swelling in her legs.  She states that she is not able to lay flat for the last 3 nights and is having to sleep in a recliner which is not normal for her.  She does have a history of OSA but states her CPAP machine is broken so she has not been using it for the last few months.  She reports she has diabetes mellitus but she has not been checking her blood sugars at home.  She states she has been on Demadex for diuresis but this caused her to have itching and hives so she stopped taking it.  She is taking Lasix in the past and tolerated it well but did not have any at home.  She reports shortness of breath with exertion and has a mild nonproductive cough.  She has not had any fever or chills.  She denies any sick contacts.  States she is not able to do her normal daily activities or play with her grandkids due to the shortness of breath.  She reports that she has been feeling generalized weakness but has not had any loss of consciousness or falls.  She does not use oxygen at home.  She states she has been using her inhalers but they have not helped. She denies tobacco alcohol or or illicit drug use  ED Course: In the emergency room she has had elevated blood pressure and mild tachycardia.  She has not had any chest pain.  She was given dose of Lasix with good diuresis in the emergency room.  She had  vascular congestion and cardiomegaly on chest x-ray with a normal BNP and normal troponin.  ER physician was going to send her home but she stated she did not feel well enough to go home.  Lab work revealed WBC of 11,800 hemoglobin 10.9 hematocrit 36.3 platelets 433,000 sodium 140 potassium 4.0 chloride 104 bicarb 29 creatinine 1.02 BUN 17 glucose 176 calcium 9.2 alk phos is 101 AST 16 ALT 22 bilirubin 0.4 BNP 42.8 troponin 12 urinalysis unremarkable.  Hospitalist service was asked to admit for further management  Review of Systems:  General: Reports generalized weakness. Denies fever, chills, weight loss, night sweats. Denies dizziness. Denies change in appetite HENT: Denies head trauma, headache, denies change in hearing, tinnitus. Denies nasal congestion. Denies sore throat.  Denies difficulty swallowing Eyes: Denies blurry vision, pain in eye, drainage.  Denies discoloration of eyes. Neck: Denies pain.  Denies swelling.  Denies pain with movement. Cardiovascular: Denies chest pain, palpitations. Reports edema. Reports orthopnea Respiratory: Reports shortness of breath. Reports dry cough. Denies wheezing.  Denies sputum production Gastrointestinal: Denies abdominal pain, swelling.  Denies nausea, vomiting, diarrhea. Denies melena. Denies hematemesis. Musculoskeletal: Denies limitation of movement.  Denies deformity or swelling. Denies arthralgias . Genitourinary: Denies pelvic pain. Denies urinary frequency or hesitancy. Denies dysuria.  Skin:  Denies rash.  Denies petechiae, purpura, ecchymosis. Neurological: Denies syncope. Denies seizure activity. Denies paresthesia. Denies slurred speech, drooping face. Denies visual change. Psychiatric: Denies depression, anxiety.  Denies hallucinations.  Past Medical History:  Diagnosis Date   Asthma    B12 deficiency    CHF (congestive heart failure) (HCC)    COPD (chronic obstructive pulmonary disease) (HCC)    Diabetes mellitus without complication  (HCC)    Hypertension    Morbid obesity (HCC)    SOB (shortness of breath)    Urinary incontinence     Past Surgical History:  Procedure Laterality Date   CESAREAN SECTION     ECTOPIC PREGNANCY SURGERY     KNEE SURGERY      Social History  reports that she quit smoking about 10 years ago. Her smoking use included cigarettes. She has a 45.00 pack-year smoking history. She has never used smokeless tobacco. She reports that she does not drink alcohol and does not use drugs.  Allergies  Allergen Reactions   Contrast Media [Iodinated Diagnostic Agents] Other (See Comments)    Coughing, sick to stomach, increase blood pressure    Empagliflozin Other (See Comments)    Yeast infection   Losartan Other (See Comments)    Back pain   Lisinopril Nausea And Vomiting   Metformin Palpitations   Ondansetron Other (See Comments) and Swelling    Family History  Problem Relation Age of Onset   Stroke Mother    Asthma Mother      Prior to Admission medications   Medication Sig Start Date End Date Taking? Authorizing Provider  acetaminophen (TYLENOL) 650 MG CR tablet Take 650 mg by mouth every 8 (eight) hours as needed for pain.    [provider]  albuterol (PROVENTIL) (2.5 MG/3ML) 0.083% nebulizer solution Take 3 mLs (2.5 mg total) by nebulization every 6 (six) hours as needed for wheezing or shortness of breath. 03/21/17   Robinson, [place] N, PA-C  albuterol (VENTOLIN HFA) 108 (90 Base) MCG/ACT inhaler Inhale 2 puffs into the lungs every 6 (six) hours as needed for wheezing or shortness of breath. 11/05/13   [provider]  cholecalciferol (VITAMIN D3) 25 MCG (1000 UNIT) tablet Take 1,000 Units by mouth daily.    [provider]  cromolyn (OPTICROM) 4 % ophthalmic solution Place 1 drop into both eyes in the morning, at noon, in the evening, and at bedtime.    [provider]  glipiZIDE (GLUCOTROL XL) 5 MG 24 hr tablet Take 5 mg by mouth every evening.   08/24/20   [provider]  GUAIFENESIN PO Take 1 tablet by mouth daily as needed (CONGESTION).    [provider]  hydrocortisone 2.5 % cream Apply 1 application topically 3 (three) times daily as needed for itching.    [provider]  OXYGEN Inhale 2 L into the lungs as needed (shortness of breath).    [provider]  Potassium 99 MG TABS Take 99 mg by mouth daily.    [provider]  spironolactone (ALDACTONE) 25 MG tablet Take 25 mg by mouth daily. Patient not taking: Reported on 09/17/2020 10/31/19   [provider]  SYMBICORT 80-4.5 MCG/ACT inhaler Inhale 2 puffs into the lungs 2 (two) times daily. 11/07/17   [provider]  torsemide (DEMADEX) 20 MG tablet TAKE 2 TABLETS(40 MG) BY MOUTH TWICE DAILY 03/11/21   Buford Dresser, MD  traMADol (ULTRAM) 50 MG tablet Take 1 tablet (50 mg total) by mouth  every 12 (twelve) hours as needed. 09/06/20   Bonnielee Haff, MD    Physical Exam: Vitals:   07/22/21 0315 07/22/21 0430 07/22/21 0445 07/22/21 0515  BP: (!) 177/107 (!) 177/99 (!) 156/98 (!) 171/92  Pulse: 100 100 96 97  Resp: (!) 21 18 (!) 21 19  Temp:      TempSrc:      SpO2: 99% 96% 94% 97%  Weight:      Height:        Constitutional: NAD, calm, comfortable Vitals:   07/22/21 0315 07/22/21 0430 07/22/21 0445 07/22/21 0515  BP: (!) 177/107 (!) 177/99 (!) 156/98 (!) 171/92  Pulse: 100 100 96 97  Resp: (!) 21 18 (!) 21 19  Temp:      TempSrc:      SpO2: 99% 96% 94% 97%  Weight:      Height:       General: WDWN, Alert and oriented x3. Sitting up on edge of bed Eyes: EOMI, PERRL, conjunctivae normal.  Sclera nonicteric HENT:  [place]/AT, external ears normal. Nares patent without epistasis.  Mucous membranes are moist. Neck: Soft, normal range of motion, supple, no masses, Trachea midline Respiratory: Equal breath sounds but diminished. Has diffuse rales no wheezing, no crackles. Normal respiratory effort. No  accessory muscle use.  Cardiovascular: Regular rate and rhythm, no murmurs / rubs / gallops. Has lower extremity edema. No JVD noted. Abdomen: Soft, no tenderness, nondistended, no guarding. No masses palpated. Morbidly obese. Bowel sounds normoactive Musculoskeletal: FROM. no cyanosis. No joint deformity upper and lower extremities. Normal muscle tone.  Skin: Warm, dry, intact no rashes, lesions, ulcers. No induration Neurologic: CN 2-12 grossly intact. Normal speech.  Sensation intact. Strength 5/5 in all extremities. Normal gait. Psychiatric: Normal judgment and insight. Normal mood and affect   Labs on Admission: I have personally reviewed following labs and imaging studies  CBC: Recent Labs  Lab 07/22/21 0206  WBC 11.8*  NEUTROABS 7.4  HGB 10.9*  HCT 36.3  MCV 86.2  PLT 433*    Basic Metabolic Panel: Recent Labs  Lab 07/22/21 0206  NA 140  K 4.0  CL 104  CO2 29  GLUCOSE 176*  BUN 17  CREATININE 1.02*  CALCIUM 9.2    GFR: Estimated Creatinine Clearance: 88.3 mL/min (A) (by C-G formula based on SCr of 1.02 mg/dL (H)).  Liver Function Tests: Recent Labs  Lab 07/22/21 0206  AST 16  ALT 22  ALKPHOS 101  BILITOT 0.4  PROT 7.6  ALBUMIN 3.3*    Urine analysis:    Component Value Date/Time   COLORURINE YELLOW (A) 07/22/2021 0211   APPEARANCEUR CLEAR (A) 07/22/2021 0211   LABSPEC <=1.005 07/22/2021 0211   PHURINE 6.0 07/22/2021 0211   GLUCOSEU NEGATIVE 07/22/2021 0211   HGBUR TRACE (A) 07/22/2021 0211   BILIRUBINUR NEGATIVE 07/22/2021 0211   KETONESUR NEGATIVE 07/22/2021 0211   PROTEINUR NEGATIVE 07/22/2021 0211   UROBILINOGEN 0.2 01/16/2020 1152   NITRITE NEGATIVE 07/22/2021 0211   LEUKOCYTESUR NEGATIVE 07/22/2021 0211    Radiological Exams on Admission: DG Chest 2 View  Result Date: 07/22/2021 CLINICAL DATA:  Shortness of breath EXAM: CHEST - 2 VIEW COMPARISON:  04/22/2021 FINDINGS: Cardiomegaly, vascular congestion. No confluent opacities,  effusions or overt edema. No acute bony abnormality. IMPRESSION: Cardiomegaly, vascular congestion. Electronically Signed   By: Rolm Baptise M.D.   On: 07/22/2021 03:44    EKG: Independently reviewed.  EKG shows sinus tachycardia with a heart rate of 104.  No  acute ST elevation or depression.  QTc mildly prolonged at 479  Assessment/Plan Principal Problem:   Acute on chronic heart failure with preserved ejection fraction (HFpEF)  Beckers will be admitted to telemetry floor.  She will be diuresed with Lasix twice a day.  Monitor I&O's and daily weights. Check Echocardiogram to evaluate EF, wall motion, valvular function.  Start Entresto.  Start Coreg twice a day Check lipid panel  Active Problems:   COPD (chronic obstructive pulmonary disease) Dulera MDI with AeroChamber.  Albuterol as needed for shortness of breath, wheezing.  Oxygen to keep O2 sat between 90 to 96%. Incentive spirometer every 2 hours while awake    Hypertension Continue Norvasc but increase to 10 mg a day from the 5 mg that she is taking.  Add Coreg 6.25 mg twice daily.  Add Entresto Monitor blood pressure    Diabetes mellitus type 2,uncontrolled.  Monitor blood sugars before every meal and before bed.  Corrective insulin ordered for glycemic control.  We will monitor blood sugar levels. Check hemoglobin A1c. Patient reports she cannot tolerate metformin.  She reports she was on Jardiance in the past but developed severe yeast infections.  Patient would benefit from GLP-1 agonist therapy when discharged  Orthopnea Lasix for diuresis.  Check echocardiogram.     OSA CPAP at night.  She reports her home CPAP machine has been broken and she has not been using it for the last few months     Prolonged QTc Avoid medications were to further prolong QT interval    Obesity, Class III, BMI 40-49.9 (morbid obesity) Follow-up with PCP for dietary, lifestyle, pharmacotherapy interventions for long-term weight loss.  Patient  may benefit from referral to bariatric surgery for evaluation     DVT prophylaxis: Lovenox for DVT prophylaxis Code Status:   Full code Family Communication:  Diagnosis and plan discussed with patient.  Patient verbalized understanding agrees with plan.  Further recommendations to follow as clinically indicated Disposition Plan:   Patient is from:  Home  Anticipated DC to:  Home  Anticipated DC date:  Anticipate 2 midnight or more stay   Admission status:  Inpatient   Yevonne Aline [place]ington Krotz MD [facility]  How to contact the [facility] Attending or Consulting provider [place] or covering provider during after hours [place], for this patient?   Check the care team in [facility] and look for a) attending/consulting [facility] provider listed and b) the [facility] team listed Log into [email or web address] and use [place] universal password to access. If you do not have the password, please contact the hospital operator. Locate the [facility] provider you are looking for under [facility] and page to a number that you can be directly reached. If you still have difficulty reaching the provider, please page the [facility] (Director on Call) for the Hospitalists listed on amion for assistance.  07/22/2021, 6:10 AM

## 2021-07-22 NOTE — Progress Notes (Signed)
Patient was placed on cpap of 14cmH2O. Patient took herself off and then she said she would put herself back on when she was ready for bed. She did not need assistance.

## 2021-07-22 NOTE — Progress Notes (Signed)
  Echocardiogram 2D Echocardiogram has been performed.  Kathryn Mahoney 07/22/2021, 3:56 PM

## 2021-07-22 NOTE — ED Notes (Signed)
ED TO INPATIENT HANDOFF REPORT  Name/Age/Gender Kathryn Mahoney 60 y.o. female  Code Status Code Status History     Date Active Date Inactive Code Status Order ID Comments User Context   09/03/2020 1644 09/06/2020 1846 Full Code [id number]  Jonnie Finner, DO Inpatient   11/02/2013 1358 11/07/2013 1427 Full Code [id number]  Phillips Grout, MD Inpatient      Questions for Most Recent Historical Code Status (Order [id number])        Home/SNF/Other Home  Chief Complaint Acute on chronic heart failure with preserved ejection fraction (HFpEF) ([place]) [I50.33]  Level of Care/Admitting Diagnosis ED Disposition     ED Disposition  Admit   Condition  --   Comment  Hospital Area: [facility] [[id number]]  Level of Care: Telemetry [5]  Admit to tele based on following criteria: Acute CHF  May admit patient to [facility] or [facility] if equivalent level of care is available:: Yes  Covid Evaluation: Asymptomatic Screening Protocol (No Symptoms)  Diagnosis: Acute on chronic heart failure with preserved ejection fraction (HFpEF) [facility]) [[id number]]  Admitting Physician: Eben Burow [[id number]]  Attending Physician: Eben Burow [[id number]]  Estimated length of stay: past midnight tomorrow  Certification:: I certify this patient will need inpatient services for at least 2 midnights          Medical History Past Medical History:  Diagnosis Date   Asthma    B12 deficiency    CHF (congestive heart failure) ([place])    COPD (chronic obstructive pulmonary disease) ([place])    Diabetes mellitus without complication ([place])    Hypertension    Morbid obesity ([place])    SOB (shortness of breath)    Urinary incontinence     Allergies Allergies  Allergen Reactions   Contrast Media [Iodinated Diagnostic Agents] Other (See Comments)    Coughing, sick to stomach, increase blood pressure    Empagliflozin Other (See Comments)    Yeast infection   Losartan Other (See  Comments)    Back pain   Lisinopril Nausea And Vomiting   Metformin Palpitations   Ondansetron Other (See Comments) and Swelling    IV Location/Drains/Wounds Patient Lines/Drains/Airways Status     Active Line/Drains/Airways     Name Placement date Placement time Site Days   Peripheral IV 09/03/20 Right;Anterior Forearm 09/03/20  1307  Forearm  322   Peripheral IV 07/22/21 20 G Left Antecubital 07/22/21  0411  Antecubital  less than 1            Labs/Imaging Results for orders placed or performed during the hospital encounter of 07/22/21 (from the past 48 hour(s))  CBC with Differential     Status: Abnormal   Collection Time: 07/22/21  2:06 AM  Result Value Ref Range   WBC 11.8 (H) 4.0 - 10.5 K/uL   RBC 4.21 3.87 - 5.11 MIL/uL   Hemoglobin 10.9 (L) 12.0 - 15.0 g/dL   HCT 36.3 36.0 - 46.0 %   MCV 86.2 80.0 - 100.0 fL   MCH 25.9 (L) 26.0 - 34.0 pg   MCHC 30.0 30.0 - 36.0 g/dL   RDW 16.1 (H) 11.5 - 15.5 %   Platelets 433 (H) 150 - 400 K/uL   nRBC 0.0 0.0 - 0.2 %   Neutrophils Relative % 62 %   Neutro Abs 7.4 1.7 - 7.7 K/uL   Lymphocytes Relative 30 %   Lymphs Abs 3.6 0.7 - 4.0 K/uL   Monocytes Relative 4 %  Monocytes Absolute 0.5 0.1 - 1.0 K/uL   Eosinophils Relative 3 %   Eosinophils Absolute 0.3 0.0 - 0.5 K/uL   Basophils Relative 0 %   Basophils Absolute 0.0 0.0 - 0.1 K/uL   Immature Granulocytes 1 %   Abs Immature Granulocytes 0.06 0.00 - 0.07 K/uL    Comment: Performed at [facility], [place] [address], [place], [place] [address]  Comprehensive metabolic panel     Status: Abnormal   Collection Time: 07/22/21  2:06 AM  Result Value Ref Range   Sodium 140 135 - 145 mmol/L   Potassium 4.0 3.5 - 5.1 mmol/L   Chloride 104 98 - 111 mmol/L   CO2 29 22 - 32 mmol/L   Glucose, Bld 176 (H) 70 - 99 mg/dL    Comment: Glucose reference range applies only to samples taken after fasting for at least 8 hours.   BUN 17 8 - 23 mg/dL   Creatinine, Ser 1.02  (H) 0.44 - 1.00 mg/dL   Calcium 9.2 8.9 - 10.3 mg/dL   Total Protein 7.6 6.5 - 8.1 g/dL   Albumin 3.3 (L) 3.5 - 5.0 g/dL   AST 16 15 - 41 U/L   ALT 22 0 - 44 U/L   Alkaline Phosphatase 101 38 - 126 U/L   Total Bilirubin 0.4 0.3 - 1.2 mg/dL   GFR, Estimated >60 >60 mL/min    Comment: (NOTE) Calculated using the CKD-EPI Creatinine Equation (2021)    Anion gap 7 5 - 15    Comment: Performed at [facility], [place] [address] [place], [place] [address]  Troponin I (High Sensitivity)     Status: None   Collection Time: 07/22/21  2:06 AM  Result Value Ref Range   Troponin I (High Sensitivity) 12 <18 ng/L    Comment: (NOTE) Elevated high sensitivity troponin I (hsTnI) values and significant  changes across serial measurements may suggest ACS but many other  chronic and acute conditions are known to elevate hsTnI results.  Refer to the "Links" section for chest pain algorithms and additional  guidance. Performed at [facility], [place] [address] [place], [place] [address]   Brain natriuretic peptide     Status: None   Collection Time: 07/22/21  2:11 AM  Result Value Ref Range   B Natriuretic Peptide 42.8 0.0 - 100.0 pg/mL    Comment: Performed at [facility], [place] [address], [place],  [address]  Urinalysis, Routine w reflex microscopic Urine, Clean Catch     Status: Abnormal   Collection Time: 07/22/21  2:11 AM  Result Value Ref Range   Color, Urine YELLOW (A) YELLOW   APPearance CLEAR (A) CLEAR   Specific Gravity, Urine <=1.005 1.005 - 1.030   pH 6.0 5.0 - 8.0   Glucose, UA NEGATIVE NEGATIVE mg/dL   Hgb urine dipstick TRACE (A) NEGATIVE   Bilirubin Urine NEGATIVE NEGATIVE   Ketones, ur NEGATIVE NEGATIVE mg/dL   Protein, ur NEGATIVE NEGATIVE mg/dL   Nitrite NEGATIVE NEGATIVE   Leukocytes,Ua NEGATIVE NEGATIVE   RBC / HPF 0-5 0 - 5 RBC/hpf   WBC, UA 6-10 0 - 5 WBC/hpf   Bacteria, UA NONE SEEN NONE SEEN   Squamous  Epithelial / LPF 0-5 0 - 5    Comment: Performed at [facility], [place] [address], [place], [place] [address]  Troponin I (High Sensitivity)     Status: None   Collection Time: 07/22/21  4:16  AM  Result Value Ref Range   Troponin I (High Sensitivity) 12 <18 ng/L    Comment: (NOTE) Elevated high sensitivity troponin I (hsTnI) values and significant  changes across serial measurements may suggest ACS but many other  chronic and acute conditions are known to elevate hsTnI results.  Refer to the "Links" section for chest pain algorithms and additional  guidance. Performed at [facility], [place] [address], [place], [place] [address]    DG Chest 2 View  Result Date: 07/22/2021 CLINICAL DATA:  Shortness of breath EXAM: CHEST - 2 VIEW COMPARISON:  04/22/2021 FINDINGS: Cardiomegaly, vascular congestion. No confluent opacities, effusions or overt edema. No acute bony abnormality. IMPRESSION: Cardiomegaly, vascular congestion. Electronically Signed   By: Rolm Baptise M.D.   On: 07/22/2021 03:44    Pending Labs Unresulted Labs (From admission, onward)     Start     Ordered   07/22/21 0555  SARS CORONAVIRUS 2 (TAT 6-24 HRS) Nasopharyngeal Nasopharyngeal Swab  (Tier 3 - Symptomatic/asymptomatic)  Once,   STAT       Question Answer Comment  Is this test for diagnosis or screening Screening   Symptomatic for COVID-19 as defined by CDC No   Hospitalized for COVID-19 No   Admitted to ICU for COVID-19 No   Previously tested for COVID-19 Yes   Resident in a congregate (group) care setting No   Employed in healthcare setting No   Pregnant No   Has patient completed COVID vaccination(s) (2 doses of Pfizer/Moderna 1 dose of [employer]) Yes   Has patient completed COVID Booster / 3rd dose Unknown      07/22/21 0555   Signed and Held  Hemoglobin A1c  Once,   R       Comments: To assess prior glycemic control    Signed and Held   Signed and Held  Basic  metabolic panel  Tomorrow morning,   R        Signed and Held            Vitals/Pain Today's Vitals   07/22/21 0430 07/22/21 0437 07/22/21 0445 07/22/21 0515  BP: (!) 177/99  (!) 156/98 (!) 171/92  Pulse: 100  96 97  Resp: 18  (!) 21 19  Temp:      TempSrc:      SpO2: 96%  94% 97%  Weight:      Height:      PainSc:  0-No pain      Isolation Precautions Airborne and Contact precautions  Medications Medications  furosemide (LASIX) injection 40 mg (40 mg Intravenous Given 07/22/21 0411)    Mobility walks

## 2021-07-22 NOTE — Plan of Care (Signed)
  Problem: Education: Goal: Knowledge of General Education information will improve Description: Including pain rating scale, medication(s)/side effects and non-pharmacologic comfort measures Outcome: Progressing   Problem: Clinical Measurements: Goal: Ability to maintain clinical measurements within normal limits will improve Outcome: Progressing Goal: Diagnostic test results will improve Outcome: Progressing   Problem: Activity: Goal: Risk for activity intolerance will decrease Outcome: Progressing   Problem: Pain Managment: Goal: General experience of comfort will improve Outcome: Progressing   Problem: Safety: Goal: Ability to remain free from injury will improve Outcome: Progressing

## 2021-07-23 DIAGNOSIS — I10 Essential (primary) hypertension: Secondary | ICD-10-CM

## 2021-07-23 DIAGNOSIS — J449 Chronic obstructive pulmonary disease, unspecified: Secondary | ICD-10-CM

## 2021-07-23 LAB — BASIC METABOLIC PANEL
Anion gap: 7 (ref 5–15)
BUN: 17 mg/dL (ref 8–23)
CO2: 31 mmol/L (ref 22–32)
Calcium: 9.1 mg/dL (ref 8.9–10.3)
Chloride: 101 mmol/L (ref 98–111)
Creatinine, Ser: 0.78 mg/dL (ref 0.44–1.00)
GFR, Estimated: >60 mL/min (ref >60)
Glucose, Bld: 185 mg/dL — ABNORMAL HIGH (ref 70–99)
Potassium: 3.8 mmol/L (ref 3.5–5.1)
Sodium: 139 mmol/L (ref 135–145)

## 2021-07-23 LAB — VITAMIN B12: Vitamin B-12: 475 pg/mL (ref 180–914)

## 2021-07-23 LAB — FOLATE: Folate: 12.6 ng/mL (ref >5.9)

## 2021-07-23 LAB — IRON AND TIBC
Iron: 33 ug/dL (ref 28–170)
Saturation Ratios: 10 % — ABNORMAL LOW (ref 10.4–31.8)
TIBC: 337 ug/dL (ref 250–450)
UIBC: 304 ug/dL

## 2021-07-23 LAB — RETICULOCYTES
Immature Retic Fract: 28.6 % — ABNORMAL HIGH (ref 2.3–15.9)
RBC.: 4.34 MIL/uL (ref 3.87–5.11)
Retic Count, Absolute: 97.6 10*3/uL (ref 19.0–186.0)
Retic Ct Pct: 2.3 % (ref 0.4–3.1)

## 2021-07-23 LAB — FERRITIN: Ferritin: 102 ng/mL (ref 11–307)

## 2021-07-23 LAB — GLUCOSE, CAPILLARY
Glucose-Capillary: 173 mg/dL — ABNORMAL HIGH (ref 70–99)
Glucose-Capillary: 159 mg/dL — ABNORMAL HIGH (ref 70–99)
Glucose-Capillary: 194 mg/dL — ABNORMAL HIGH (ref 70–99)

## 2021-07-23 MED ORDER — KETOTIFEN FUMARATE 0.025 % OP SOLN
1.0000 [drp] | Freq: Two times a day (BID) | OPHTHALMIC | Status: DC
  Administered 2021-07-23 – 2021-07-25 (×4): 1 [drp] via OPHTHALMIC
  Filled 2021-07-23: qty 5

## 2021-07-23 MED ORDER — VITAMIN D (ERGOCALCIFEROL) 1.25 MG (50000 UNIT) PO CAPS
50000.0000 [IU] | ORAL_CAPSULE | ORAL | Status: DC
  Administered 2021-07-23: 50000 [IU] via ORAL
  Filled 2021-07-23: qty 1

## 2021-07-23 MED ORDER — POLYETHYLENE GLYCOL 3350 17 G PO PACK
17.0000 g | PACK | Freq: Every day | ORAL | Status: DC
  Administered 2021-07-23 – 2021-07-25 (×3): 17 g via ORAL
  Filled 2021-07-23 (×3): qty 1

## 2021-07-23 MED ORDER — SENNOSIDES-DOCUSATE SODIUM 8.6-50 MG PO TABS
2.0000 | ORAL_TABLET | Freq: Two times a day (BID) | ORAL | Status: DC
  Administered 2021-07-23 – 2021-07-25 (×5): 2 via ORAL
  Filled 2021-07-23 (×5): qty 2

## 2021-07-23 MED ORDER — NAPHAZOLINE-GLYCERIN 0.012-0.25 % OP SOLN: 1.0000 [drp] | Freq: Four times a day (QID) | OPHTHALMIC | Status: DC | PRN

## 2021-07-23 MED ORDER — ALBUTEROL SULFATE (2.5 MG/3ML) 0.083% IN NEBU: 2.5000 mg | INHALATION_SOLUTION | Freq: Four times a day (QID) | RESPIRATORY_TRACT | Status: DC | PRN

## 2021-07-23 NOTE — Progress Notes (Signed)
PROGRESS NOTE    Kathryn Mahoney  NAT:[id number] DOB: [birth year] DOA: 07/22/2021 PCP: [facility]    Chief Complaint  Patient presents with   Shortness of Breath   Chest Pain    Brief Narrative:   60 year old lady prior history of diastolic heart failure, type 2 diabetes, hypertension, COPD, obstructive sleep apnea on CPAP, morbid obesity presents for evaluation of shortness of breath.  She reports that she was on torsemide and she stopped taking it as she started having hives.  She was admitted for evaluation and management of acute on chronic diastolic heart failure. Assessment & Plan:   Principal Problem:   Acute on chronic heart failure with preserved ejection fraction (HFpEF) (HCC) Active Problems:   COPD (chronic obstructive pulmonary disease) (HCC)   Hypertension   Diabetes mellitus type 2, uncontrolled (HCC)   Orthopnea   Obesity, Class III, BMI 40-49.9 (morbid obesity) ([place])   Acute respiratory failure with hypoxia probably secondary to acute on chronic diastolic heart failure. Recurrent probably as she stopped taking torsemide. She was started on IV Lasix 40 mg twice daily and diuresed about 2 L since admission.  Continue with strict intake and output and daily weights. Echocardiogram showed preserved left ventricular ejection fraction .The left ventricle has low normal function with no regional wall motion abnormalities. There is moderate left ventricular hypertrophy. Left ventricular diastolic parameters are consistent with Grade I diastolic dysfunction (impaired relaxation). Recommend to continue with IV Lasix for another 24 hours.  Continue to monitor renal parameters and electrolytes. Patient reports that she follows up with cardiologist at [facility]. Continue with Coreg and Entresto 24, 26 mg twice daily.   COPD No wheezing heard on exam continue with bronchodilators as needed.   Hypertension Continue with Coreg 25 mg twice daily,  Norvasc 10 mg.   Type 2 diabetes mellitus with hyperglycemia, uncontrolled CBG (last 3)  Recent Labs    07/22/21 1619 07/22/21 2100 07/23/21 0742  GLUCAP 197* 198* 173*  Non-insulin-dependent at home Currently on sliding scale insulin. Hemoglobin A1c this admission is 8.6%    Body mass index is 45.25 kg/m. Morbid obesity Outpatient follow up with PCP for weight loss.    Mild anemia of chronic disease Baseline hemoglobin appears to be around 11. Anemia panel within normal limits Hemoglobin on admission is 10.9. Stool for occult blood ordered Patient reports she had a colonoscopy more than 10 years ago   DVT prophylaxis: (Lovenox) Code Status: Full code Family Communication: None at bedside Disposition:   Status is: Inpatient  Remains inpatient appropriate because:Ongoing diagnostic testing needed not appropriate for outpatient work up, Unsafe d/c plan, and IV treatments appropriate due to intensity of illness or inability to take PO  Dispo: The patient is from: Home              Anticipated d/c is to: Home              Patient currently is not medically stable to d/c.   Difficult to place patient No       Consultants:  None  Procedures: Echocardiogram Antimicrobials: None   Subjective: Reports feeling slightly little better when compared to yesterday Patient work with PT and she was found to be hypoxic on room air with sats dropped to 83%.  She is currently requiring 2 L of nasal cannula oxygen to keep sats greater than 90% on ambulation.  Objective: Vitals:   07/23/21 0500 07/23/21 0817 07/23/21 0831 07/23/21 0919  BP:  128/84 (!) 150/86  Pulse:   91 95  Resp:    18  Temp:   98.4 F (36.9 C) 98.2 F (36.8 C)  TempSrc:    Oral  SpO2:  (!) 88%  96%  Weight: (!) 139 kg     Height:        Intake/Output Summary (Last 24 hours) at 07/23/2021 1113 Last data filed at 07/23/2021 1000 Gross per 24 hour  Intake 543 ml  Output 1850 ml  Net -1307 ml    Filed Weights   07/22/21 0206 07/22/21 0700 07/23/21 0500  Weight: (!) 145.2 kg (!) 141.3 kg (!) 139 kg    Examination:  General exam: Appears calm and comfortable  Respiratory system: Diminished air entry on bases, tachypnea on 2 L of nasal cannula oxygen Cardiovascular system: S1 & S2 heard, RRR.  JVD cannot be appreciated, no pedal edema. Gastrointestinal system: Abdomen is nondistended, soft and nontender. No organomegaly or masses felt. Normal bowel sounds heard. Central nervous system: Alert and oriented. No focal neurological deficits. Extremities: Symmetric 5 x 5 power. Skin: No rashes, lesions or ulcers Psychiatry:  Mood & affect appropriate.     Data Reviewed: I have personally reviewed following labs and imaging studies  CBC: Recent Labs  Lab 07/22/21 0206  WBC 11.8*  NEUTROABS 7.4  HGB 10.9*  HCT 36.3  MCV 86.2  PLT 433*    Basic Metabolic Panel: Recent Labs  Lab 07/22/21 0206 07/23/21 0431  NA 140 139  K 4.0 3.8  CL 104 101  CO2 29 31  GLUCOSE 176* 185*  BUN 17 17  CREATININE 1.02* 0.78  CALCIUM 9.2 9.1    GFR: Estimated Creatinine Clearance: 109.7 mL/min (by C-G formula based on SCr of 0.78 mg/dL).  Liver Function Tests: Recent Labs  Lab 07/22/21 0206  AST 16  ALT 22  ALKPHOS 101  BILITOT 0.4  PROT 7.6  ALBUMIN 3.3*    CBG: Recent Labs  Lab 07/22/21 0836 07/22/21 1112 07/22/21 1619 07/22/21 2100 07/23/21 0742  GLUCAP 150* 199* 197* 198* 173*     Recent Results (from the past 240 hour(s))  SARS CORONAVIRUS 2 (TAT 6-24 HRS) Nasopharyngeal Nasopharyngeal Swab     Status: None   Collection Time: 07/22/21  5:55 AM   Specimen: Nasopharyngeal Swab  Result Value Ref Range Status   SARS Coronavirus 2 NEGATIVE NEGATIVE Final    Comment: (NOTE) SARS-CoV-2 target nucleic acids are NOT DETECTED.  The SARS-CoV-2 RNA is generally detectable in upper and lower respiratory specimens during the acute phase of infection.  Negative results do not preclude SARS-CoV-2 infection, do not rule out co-infections with other pathogens, and should not be used as the sole basis for treatment or other patient management decisions. Negative results must be combined with clinical observations, patient history, and epidemiological information. The expected result is Negative.  Fact Sheet for Patients: SugarRoll.be  Fact Sheet for Healthcare Providers: https://www.woods-mathews.com/  This test is not yet approved or cleared by the [place] FDA and  has been authorized for detection and/or diagnosis of SARS-CoV-2 by FDA under an Emergency Use Authorization (EUA). This EUA will remain  in effect (meaning this test can be used) for the duration of the COVID-19 declaration under Se ction 564(b)(1) of the Act, 21 U.S.C. section 360bbb-3(b)(1), unless the authorization is terminated or revoked sooner.  Performed at [facility], [place] [address] [place], [place] [address]          Radiology  Studies: DG Chest 2 View  Result Date: 07/22/2021 CLINICAL DATA:  Shortness of breath EXAM: CHEST - 2 VIEW COMPARISON:  04/22/2021 FINDINGS: Cardiomegaly, vascular congestion. No confluent opacities, effusions or overt edema. No acute bony abnormality. IMPRESSION: Cardiomegaly, vascular congestion. Electronically Signed   By: Rolm Baptise M.D.   On: 07/22/2021 03:44   ECHOCARDIOGRAM COMPLETE  Result Date: 07/22/2021    ECHOCARDIOGRAM REPORT   Patient Name:   AMBRIE CARTE Date of Exam: 07/22/2021 Medical Rec #:  [id number]       Height:       69.0 in Accession #:    [id number]      Weight:       311.5 lb Date of Birth:  May 16, 1961       BSA:          2.494 m Patient Age:    14 years        BP:           170/101 mmHg Patient Gender: F               HR:           81 bpm. Exam Location:  Inpatient Procedure: 2D Echo, Cardiac Doppler and Color Doppler Indications:    R06.02 SOB   History:        Patient has prior history of Echocardiogram examinations, most                 recent 09/04/2020. CHF, COPD, Mitral Valve Disease; Risk                 Factors:Diabetes. Mitral regurgitation.  Sonographer:    Roseanna Rainbow RDCS Referring Phys: [id number] [facility]  Sonographer Comments: Technically difficult study due to poor echo windows and patient is morbidly obese. Image acquisition challenging due to patient body habitus. Patient talking throughout exam. IMPRESSIONS  1. Left ventricular ejection fraction, by estimation, is 50 to 55%. Left ventricular ejection fraction by 2D MOD biplane is 53.5 %. The left ventricle has low normal function. The left ventricle has no regional wall motion abnormalities. There is moderate left ventricular hypertrophy. Left ventricular diastolic parameters are consistent with Grade I diastolic dysfunction (impaired relaxation). LV filling pressure apperas normal.  2. Right ventricular systolic function is normal. The right ventricular size is normal. There is mildly elevated pulmonary artery systolic pressure. The estimated right ventricular systolic pressure is 78.4 mmHg.  3. The mitral valve is abnormal. The leaflet tips are sclerotic. Mild to moderate mitral valve regurgitation. There is mild late systolic prolapse of of the mitral valve. (Image 95)  4. The aortic valve is tricuspid. Aortic valve regurgitation is not visualized.  5. The inferior vena cava is dilated in size with >50% respiratory variability, suggesting right atrial pressure of 8 mmHg. Comparison(s): No significant change from prior study. 09/04/2020: LVEF 50-55%, moderate MR. FINDINGS  Left Ventricle: Left ventricular ejection fraction, by estimation, is 50 to 55%. Left ventricular ejection fraction by 2D MOD biplane is 53.5 %. The left ventricle has low normal function. The left ventricle has no regional wall motion abnormalities. The left ventricular internal cavity size was normal in size.  There is moderate left ventricular hypertrophy. Left ventricular diastolic parameters are consistent with Grade I diastolic dysfunction (impaired relaxation). Normal left ventricular filling pressure. Right Ventricle: The right ventricular size is normal. No increase in right ventricular wall thickness. Right ventricular systolic function is normal. There is mildly elevated pulmonary artery systolic pressure. The  tricuspid regurgitant velocity is 2.68  m/s, and with an assumed right atrial pressure of 8 mmHg, the estimated right ventricular systolic pressure is 26.7 mmHg. Left Atrium: Left atrial size was normal in size. Right Atrium: Right atrial size was normal in size. Pericardium: There is no evidence of pericardial effusion. Mitral Valve: The mitral valve is abnormal. There is mild late systolic prolapse of of the mitral valve. There is mild thickening of the mitral valve leaflet(s). Mild to moderate mitral valve regurgitation. MV peak gradient, 6.7 mmHg. The mean mitral valve gradient is 3.0 mmHg. Tricuspid Valve: The tricuspid valve is grossly normal. Tricuspid valve regurgitation is trivial. Aortic Valve: The aortic valve is tricuspid. Aortic valve regurgitation is not visualized. Pulmonic Valve: The pulmonic valve was normal in structure. Pulmonic valve regurgitation is not visualized. Aorta: The aortic root and ascending aorta are structurally normal, with no evidence of dilitation. Venous: The inferior vena cava is dilated in size with greater than 50% respiratory variability, suggesting right atrial pressure of 8 mmHg. IAS/Shunts: No atrial level shunt detected by color flow Doppler.  LEFT VENTRICLE PLAX 2D                        Biplane EF (MOD) LVIDd:         5.00 cm         LV Biplane EF:   Left LVIDs:         3.60 cm                          ventricular LV PW:         1.40 cm                          ejection LV IVS:        1.80 cm                          fraction by LVOT diam:     2.10 cm                           2D MOD LV SV:         67                               biplane is LV SV Index:   27                               53.5 %. LVOT Area:     3.46 cm                                Diastology                                LV e' medial:    6.09 cm/s LV Volumes (MOD)               LV E/e' medial:  18.9 LV vol d, MOD    172.0 ml      LV e' lateral:   5.44 cm/s A2C:  LV E/e' lateral: 21.1 LV vol d, MOD    159.0 ml A4C: LV vol s, MOD    63.8 ml A2C: LV vol s, MOD    92.6 ml A4C: LV SV MOD A2C:   108.2 ml LV SV MOD A4C:   159.0 ml LV SV MOD BP:    90.5 ml RIGHT VENTRICLE             IVC RV S prime:     12.20 cm/s  IVC diam: 2.50 cm TAPSE (M-mode): 2.6 cm LEFT ATRIUM             Index       RIGHT ATRIUM          Index LA diam:        3.50 cm 1.40 cm/m  RA Area:     8.04 cm LA Vol (A2C):   56.3 ml 22.57 ml/m RA Volume:   10.70 ml 4.29 ml/m LA Vol (A4C):   71.9 ml 28.81 ml/m LA Biplane Vol: 59.8 ml 23.98 ml/m  AORTIC VALVE LVOT Vmax:   121.00 cm/s LVOT Vmean:  83.100 cm/s LVOT VTI:    0.192 m  AORTA Ao Root diam: 3.30 cm Ao Asc diam:  3.60 cm MITRAL VALVE                 TRICUSPID VALVE MV Area (PHT): 5.13 cm      TR Peak grad:   28.7 mmHg MV Area VTI:   2.36 cm      TR Vmax:        268.00 cm/s MV Peak grad:  6.7 mmHg MV Mean grad:  3.0 mmHg      SHUNTS MV Vmax:       1.29 m/s      Systemic VTI:  0.19 m MV Vmean:      77.6 cm/s     Systemic Diam: 2.10 cm MV Decel Time: 148 msec MR Peak grad:    143.5 mmHg MR Mean grad:    96.0 mmHg MR Vmax:         599.00 cm/s MR Vmean:        459.0 cm/s MR PISA:         1.27 cm MR PISA Eff ROA: 7 mm MR PISA Radius:  0.45 cm MV E velocity: 115.00 cm/s MV A velocity: 141.00 cm/s MV E/A ratio:  0.82 Lyman Bishop MD Electronically signed by Lyman Bishop MD Signature Date/Time: 07/22/2021/4:27:18 PM    Final         Scheduled Meds:  amLODipine  10 mg Oral Daily   carvedilol  6.25 mg Oral BID WC   enoxaparin (LOVENOX) injection  70 mg  Subcutaneous Q24H   furosemide  40 mg Intravenous Q12H   influenza vac split quadrivalent PF  0.5 mL Intramuscular Tomorrow-1000   insulin aspart  0-5 Units Subcutaneous QHS   insulin aspart  0-9 Units Subcutaneous TID WC   mometasone-formoterol  2 puff Inhalation BID   polyethylene glycol  17 g Oral Daily   sacubitril-valsartan  1 tablet Oral BID   senna-docusate  2 tablet Oral BID   sodium chloride flush  3 mL Intravenous Q12H   Continuous Infusions:  sodium chloride       LOS: 1 day        Hosie Poisson, MD [facility]   To contact the attending provider between 7A-7P or the covering provider during after hours 7P-7A, please log into the web site [email or web address]  and access using universal [place] password for that web site. If you do not have the password, please call the hospital operator.  07/23/2021, 11:13 AM

## 2021-07-23 NOTE — Evaluation (Signed)
Occupational Therapy Evaluation Patient Details Name: Kathryn Mahoney MRN: [id number] DOB: Jun 11, 1961 Today's Date: 07/23/2021   History of Present Illness Patient is a 60 year old female who presented to the hosptial on 9/26 with shortness of breath. patient was found to have acute on chronic heart failure with preserved ejection fraction.  PMH: COPD, chronic diastolic heart failure, morbid obesity, DM type II,   Clinical Impression   Patient evaluated by Occupational Therapy with no further acute OT needs identified. All education has been completed and the patient has no further questions. Patient is MI for ADLs at this time. Patient was educated on getting pulse Ox for home. Patient verbalized understanding.  See below for any follow-up Occupational Therapy or equipment needs. OT is signing off. Thank you for this referral.       Recommendations for follow up therapy are one component of a multi-disciplinary discharge planning process, led by the attending physician.  Recommendations may be updated based on patient status, additional functional criteria and insurance authorization.   Follow Up Recommendations  No OT follow up    Equipment Recommendations  None recommended by OT    Recommendations for Other Services       Precautions / Restrictions Precautions Precaution Comments: monitor O2 Restrictions Weight Bearing Restrictions: No      Mobility Bed Mobility               General bed mobility comments: up in recliner    Transfers Overall transfer level: Modified independent Equipment used: None                  Balance Overall balance assessment: Modified Independent                                         ADL either performed or assessed with clinical judgement   ADL Overall ADL's : Modified independent                                       General ADL Comments: patient is able to complete LB dresisng,  toileting, shower transfers, and functional mobility with MI on this date. patient reported having O2 at home. patient also indicated that she feels like she is back to baseline for ADL tasks at this time. patient to transition back home with children at time of d/c.     Vision Patient Visual Report: No change from baseline       Perception     Praxis      Pertinent Vitals/Pain Pain Assessment: Faces Faces Pain Scale: Hurts little more Pain Location: back Pain Descriptors / Indicators: Aching Pain Intervention(s): Limited activity within patient's tolerance;Monitored during session     Hand Dominance Right   Extremity/Trunk Assessment Upper Extremity Assessment Upper Extremity Assessment: Overall WFL for tasks assessed   Lower Extremity Assessment Lower Extremity Assessment: Defer to PT evaluation   Cervical / Trunk Assessment Cervical / Trunk Assessment: Normal   Communication Communication Communication: No difficulties   Cognition Arousal/Alertness: Awake/alert Behavior During Therapy: WFL for tasks assessed/performed Overall Cognitive Status: Within Functional Limits for tasks assessed  General Comments       Exercises     Shoulder Instructions      Home Living Family/patient expects to be discharged to:: Private residence Living Arrangements: Children Available Help at Discharge: Family;Available 24 hours/day   Home Access: Stairs to enter [employer] of Steps: "a few"   Home Layout: One level     Bathroom Shower/Tub: Tub/shower unit         Home Equipment: None   Additional Comments: home O2, uses 2L prn      Prior Functioning/Environment Level of Independence: Independent                 OT Problem List:        OT Treatment/Interventions:      OT Goals(Current goals can be found in the care plan section) Acute Rehab OT Goals Patient Stated Goal: to "get iron levels  better" OT Goal Formulation: All assessment and education complete, DC therapy  OT Frequency:     Barriers to D/C:            Co-evaluation              AM-PAC OT "6 Clicks" Daily Activity     Outcome Measure Help from another person eating meals?: None Help from another person taking care of personal grooming?: None Help from another person toileting, which includes using toliet, bedpan, or urinal?: None Help from another person bathing (including washing, rinsing, drying)?: None Help from another person to put on and taking off regular upper body clothing?: None Help from another person to put on and taking off regular lower body clothing?: None 6 Click Score: 24   End of Session    Activity Tolerance: Patient tolerated treatment well Patient left: in chair;with call bell/phone within reach  OT Visit Diagnosis: Unsteadiness on feet (R26.81)                Time: 6060-0459 OT Time Calculation (min): 23 min Charges:  OT General Charges $OT Visit: 1 Visit OT Evaluation $OT Eval Low Complexity: 1 Low OT Treatments $Self Care/Home Management : 8-22 mins  Jackelyn Poling OTR/L, MS Acute Rehabilitation Department Office# [phone] Pager# [phone] 07/23/2021, 11:37 AM

## 2021-07-23 NOTE — Plan of Care (Signed)
  Problem: Education: Goal: Knowledge of General Education information will improve Description: Including pain rating scale, medication(s)/side effects and non-pharmacologic comfort measures Outcome: Progressing   Problem: Clinical Measurements: Goal: Diagnostic test results will improve Outcome: Progressing   Problem: Activity: Goal: Risk for activity intolerance will decrease Outcome: Progressing   Problem: Cardiac: Goal: Ability to achieve and maintain adequate cardiopulmonary perfusion will improve Outcome: Progressing

## 2021-07-23 NOTE — Evaluation (Addendum)
Physical Therapy One Time Evaluation Patient Details Name: Kathryn Mahoney MRN: [id number] DOB: [birth year] Today's Date: 07/23/2021  History of Present Illness  Patient is a 60 year old female who presented to the hosptial on 9/26 with shortness of breath. patient was found to have acute on chronic heart failure with preserved ejection fraction.  PMH: COPD, chronic diastolic heart failure, morbid obesity, DM type II  Clinical Impression  Patient evaluated by Physical Therapy with no further acute PT needs identified. All education has been completed and the patient has no further questions.  Pt pleasant and cooperative. Pt ambulated in hallway and required supplemental oxygen.  Pt educated on current saturations and oxygen liters requirements.  Pt hopeful to improve iron levels and oxygen levels prior to d/c. See below for any follow-up Physical Therapy or equipment needs. PT is signing off. Thank you for this referral.    SATURATION QUALIFICATIONS: (This note is used to comply with regulatory documentation for home oxygen)  Patient Saturations on Room Air at Rest = 92%  Patient Saturations on Room Air while Ambulating = 83%  Patient Saturations on 2 Liters of oxygen while Ambulating = 92%  Please briefly explain why patient needs home oxygen: to improve oxygen saturations during physical activities such as ambulation.   Recommendations for follow up therapy are one component of a multi-disciplinary discharge planning process, led by the attending physician.  Recommendations may be updated based on patient status, additional functional criteria and insurance authorization.  Follow Up Recommendations No PT follow up    Equipment Recommendations  None recommended by PT    Recommendations for Other Services       Precautions / Restrictions Precautions Precautions: Other (comment) Precaution Comments: monitor O2 Restrictions Weight Bearing Restrictions: No      Mobility  Bed  Mobility Overal bed mobility: Independent             General bed mobility comments: up in recliner    Transfers Overall transfer level: Modified independent Equipment used: None                Ambulation/Gait Ambulation/Gait assistance: Modified independent (Device/Increase time) Gait Distance (Feet): 120 Feet (x2) Assistive device: None Gait Pattern/deviations: WFL(Within Functional Limits)     General Gait Details: monitored saturations and pt required oxygen  Stairs            Wheelchair Mobility    Modified Rankin (Stroke Patients Only)       Balance Overall balance assessment: Modified Independent                                           Pertinent Vitals/Pain Pain Assessment: Faces Faces Pain Scale: Hurts little more Pain Location: back Pain Descriptors / Indicators: Aching Pain Intervention(s): Limited activity within patient's tolerance;Monitored during session    Home Living Family/patient expects to be discharged to:: Private residence Living Arrangements: Children Available Help at Discharge: Family;Available 24 hours/day   Home Access: Stairs to enter   [employer] of Steps: "a few" Home Layout: One level Home Equipment: None Additional Comments: home O2, uses 2L prn    Prior Function Level of Independence: Independent               Hand Dominance   Dominant Hand: Right    Extremity/Trunk Assessment   Upper Extremity Assessment Upper Extremity Assessment: Overall WFL for tasks assessed  Lower Extremity Assessment Lower Extremity Assessment: Overall WFL for tasks assessed    Cervical / Trunk Assessment Cervical / Trunk Assessment: Normal  Communication   Communication: No difficulties  Cognition Arousal/Alertness: Awake/alert Behavior During Therapy: WFL for tasks assessed/performed Overall Cognitive Status: Within Functional Limits for tasks assessed                                         General Comments      Exercises     Assessment/Plan    PT Assessment Patent does not need any further PT services  PT Problem List         PT Treatment Interventions      PT Goals (Current goals can be found in the Care Plan section)  Acute Rehab PT Goals Patient Stated Goal: to "get iron levels better" PT Goal Formulation: All assessment and education complete, DC therapy    Frequency     Barriers to discharge        Co-evaluation               AM-PAC PT "6 Clicks" Mobility  Outcome Measure Help needed turning from your back to your side while in a flat bed without using bedrails?: None Help needed moving from lying on your back to sitting on the side of a flat bed without using bedrails?: None Help needed moving to and from a bed to a chair (including a wheelchair)?: None Help needed standing up from a chair using your arms (e.g., wheelchair or bedside chair)?: None Help needed to walk in hospital room?: None Help needed climbing 3-5 steps with a railing? : A Little 6 Click Score: 23    End of Session Equipment Utilized During Treatment: Oxygen Activity Tolerance: Patient tolerated treatment well Patient left: in chair;with call bell/phone within reach Nurse Communication: Mobility status PT Visit Diagnosis: Difficulty in walking, not elsewhere classified (R26.2)    Time: 6063-0160 PT Time Calculation (min) (ACUTE ONLY): 16 min   Charges:   PT Evaluation $PT Eval Low Complexity: 1 Low        Kati PT, DPT Acute Rehabilitation Services Pager: [phone] Office: [place] 07/23/2021, 11:45 AM

## 2021-07-24 DIAGNOSIS — I5033 Acute on chronic diastolic (congestive) heart failure: Secondary | ICD-10-CM | POA: Diagnosis not present

## 2021-07-24 LAB — CBC
HCT: 37.6 % (ref 36.0–46.0)
Hemoglobin: 11.6 g/dL — ABNORMAL LOW (ref 12.0–15.0)
MCH: 25.8 pg — ABNORMAL LOW (ref 26.0–34.0)
MCHC: 30.9 g/dL (ref 30.0–36.0)
MCV: 83.7 fL (ref 80.0–100.0)
Platelets: 429 10*3/uL — ABNORMAL HIGH (ref 150–400)
RBC: 4.49 MIL/uL (ref 3.87–5.11)
RDW: 16.5 % — ABNORMAL HIGH (ref 11.5–15.5)
WBC: 9.9 10*3/uL (ref 4.0–10.5)
nRBC: 0 % (ref 0.0–0.2)

## 2021-07-24 LAB — GLUCOSE, CAPILLARY
Glucose-Capillary: 180 mg/dL — ABNORMAL HIGH (ref 70–99)
Glucose-Capillary: 152 mg/dL — ABNORMAL HIGH (ref 70–99)
Glucose-Capillary: 113 mg/dL — ABNORMAL HIGH (ref 70–99)
Glucose-Capillary: 157 mg/dL — ABNORMAL HIGH (ref 70–99)

## 2021-07-24 LAB — BASIC METABOLIC PANEL
Anion gap: 8 (ref 5–15)
BUN: 20 mg/dL (ref 8–23)
CO2: 33 mmol/L — ABNORMAL HIGH (ref 22–32)
Calcium: 9.8 mg/dL (ref 8.9–10.3)
Chloride: 102 mmol/L (ref 98–111)
Creatinine, Ser: 0.82 mg/dL (ref 0.44–1.00)
GFR, Estimated: >60 mL/min (ref >60)
Glucose, Bld: 169 mg/dL — ABNORMAL HIGH (ref 70–99)
Potassium: 3.8 mmol/L (ref 3.5–5.1)
Sodium: 143 mmol/L (ref 135–145)

## 2021-07-24 LAB — OCCULT BLOOD X 1 CARD TO LAB, STOOL: Fecal Occult Bld: NEGATIVE

## 2021-07-24 MED ORDER — FUROSEMIDE 40 MG PO TABS
80.0000 mg | ORAL_TABLET | Freq: Two times a day (BID) | ORAL | Status: DC
  Administered 2021-07-24 – 2021-07-25 (×2): 80 mg via ORAL
  Filled 2021-07-24 (×2): qty 2

## 2021-07-24 MED ORDER — SPIRONOLACTONE 25 MG PO TABS: 25.0000 mg | ORAL_TABLET | Freq: Every day | ORAL | Status: DC

## 2021-07-24 MED ORDER — FERROUS SULFATE 325 (65 FE) MG PO TABS
325.0000 mg | ORAL_TABLET | Freq: Every day | ORAL | Status: DC
  Administered 2021-07-25: 325 mg via ORAL
  Filled 2021-07-24: qty 1

## 2021-07-24 MED ORDER — FUROSEMIDE 40 MG PO TABS
40.0000 mg | ORAL_TABLET | Freq: Every day | ORAL | Status: DC
  Administered 2021-07-24: 40 mg via ORAL
  Filled 2021-07-24: qty 1

## 2021-07-24 NOTE — TOC Progression Note (Signed)
Transition of Care [facility]) - Progression Note    Patient Details  Name: Kathryn Mahoney MRN: [id number] Date of Birth: [birth year]  Transition of [facility]) CM/SW Contact  Purcell Mouton, RN Phone Number: 07/24/2021, 12:28 PM  Clinical Narrative:    Spoke with pt concerning CPAP, Home O2 and New PCP. Pt states she is with [facility] for CPAP and that she has called for a new CPAP machine because the one she has does not work. Pt was encouraged to call the number on her card for to change PCP or make an appointment with one of [facility] pt [place]. Pt was asking for referrals to speciality MD's, referred her to her PCP. [facility] was called and will take care of CPAP machine.    Expected Discharge Plan: Home/Self Care Barriers to Discharge: No Barriers Identified  Expected Discharge Plan and Services Expected Discharge Plan: Home/Self Care       Living arrangements for the past 2 months: Single Family Home                                       Social Determinants of Health (SDOH) Interventions    Readmission Risk Interventions No flowsheet data found.

## 2021-07-24 NOTE — Progress Notes (Signed)
PROGRESS NOTE    Kathryn Mahoney  KWI:[id number] DOB: [birth year] DOA: 07/22/2021 [facility]CP: [facility]    Chief Complaint  Patient presents with   Shortness of Breath   Chest Pain    Brief Narrative:  60 year old lady prior history of diastolic heart failure, type 2 diabetes, hypertension, COPD, obstructive sleep apnea on CPAP, morbid obesity presents for evaluation of shortness of breath.  She reports that she was on torsemide and she stopped taking it as she started having hives.  She was admitted for evaluation and management of acute on chronic diastolic heart failure.  Subjective:  Feeling better but does not feel back to baseline yet, still feel sob when walking with PT , o2 dropped to 83% while walk, no chest pain  Reports having rash while on demadex, she is ok to take lasix, she thinks she is still volume overloaded, she prefers iv lasix today She does not want spironolactone, cardiology oked with stopping spironolactone  Assessment & Plan:   Principal Problem:   Acute on chronic heart failure with preserved ejection fraction (HFpEF) (HCC) Active Problems:   COPD (chronic obstructive pulmonary disease) ([place])   Hypertension   Diabetes mellitus type 2, uncontrolled ([place])   Orthopnea   Obesity, Class III, BMI 40-49.9 (morbid obesity) ([place])  Acute hypoxic respiratory failure likely secondary to acute on chronic diastolic CHF exacerbation -Improving on IV Lasix -Cardiology input appreciated, she is currently on Entresto, Coreg, Lasix -Patient does not want torsemide, she think torsemide caused hives, she does not want spironolactone due to low back pain while taking it, she does not want SGLT2 inhibitor due to GU infection -Wean oxygen, home O2 eval  HTN Presents with hypertension emergency -Blood pressure has improved on current regimen -She also report has been noncompliance with CPAP at home  Mild pulmonary hypertension Refused right heart cath in the  past  Copd, stopped smoking since 2010  OSA noncompliant with CPAP  Reports home CPAP not working,  Case manager consulted   Noninsulin-dependent type 2 diabetes, uncontrolled with hyperglycemia A1c 8.6% On glipizide at home Currently on SSI Report  metformin cause palpitation Does not want SGIT2 inhibitor due to gu infection Will discuss with patient regarding dpp4 inhibitor or GLP agonist      Body mass index is 45.32 kg/m..use to see weight loss doctor       Unresulted Labs (From admission, onward)     Start     Ordered   07/24/21 2426  Basic metabolic panel  Daily,   R      07/23/21 0942   07/23/21 1556  Glucose, capillary  Once,   R        07/23/21 1556              DVT prophylaxis: lovenox   Code Status: full Family Communication: patient  Disposition:   Status is: Inpatient   Dispo: The patient is from: home              Anticipated d/c is to: home              Anticipated d/c date is: likely tomorrow when sob improves                Consultants:  cardiology  Procedures:  none  Antimicrobials:   Anti-infectives (From admission, onward)    None           Objective: Vitals:   07/24/21 0539 07/24/21 0843 07/24/21 0942 07/24/21 0949  BP:  116/82  127/77   Pulse: 91  88 89  Resp: 18  16   Temp: 98.2 F (36.8 C)  98.2 F (36.8 C)   TempSrc: Oral  Oral   SpO2: 91% 92% 95%   Weight:      Height:        Intake/Output Summary (Last 24 hours) at 07/24/2021 1136 Last data filed at 07/24/2021 4388 Gross per 24 hour  Intake 900 ml  Output --  Net 900 ml   Filed Weights   07/22/21 0700 07/23/21 0500 07/24/21 0500  Weight: (!) 141.3 kg (!) 139 kg (!) 139.2 kg    Examination:  General exam: calm, NAD Respiratory system: Clear to auscultation. Respiratory effort normal. Cardiovascular system: S1 & S2 heard, RRR. No pedal edema. Gastrointestinal system: Abdomen is nondistended, soft and nontender. Normal bowel sounds  heard. Central nervous system: Alert and oriented. No focal neurological deficits. Extremities: Symmetric 5 x 5 power. Skin: No rashes, lesions or ulcers Psychiatry: Judgement and insight appear normal. Mood & affect appropriate.     Data Reviewed: I have personally reviewed following labs and imaging studies  CBC: Recent Labs  Lab 07/22/21 0206 07/24/21 0419  WBC 11.8* 9.9  NEUTROABS 7.4  --   HGB 10.9* 11.6*  HCT 36.3 37.6  MCV 86.2 83.7  PLT 433* 429*    Basic Metabolic Panel: Recent Labs  Lab 07/22/21 0206 07/23/21 0431 07/24/21 0419  NA 140 139 143  K 4.0 3.8 3.8  CL 104 101 102  CO2 29 31 33*  GLUCOSE 176* 185* 169*  BUN 17 17 20   CREATININE 1.02* 0.78 0.82  CALCIUM 9.2 9.1 9.8    GFR: Estimated Creatinine Clearance: 107.1 mL/min (by C-G formula based on SCr of 0.82 mg/dL).  Liver Function Tests: Recent Labs  Lab 07/22/21 0206  AST 16  ALT 22  ALKPHOS 101  BILITOT 0.4  PROT 7.6  ALBUMIN 3.3*    CBG: Recent Labs  Lab 07/22/21 2100 07/23/21 0742 07/23/21 1131 07/23/21 1932 07/24/21 0805  GLUCAP 198* 173* 159* 194* 180*     Recent Results (from the past 240 hour(s))  SARS CORONAVIRUS 2 (TAT 6-24 HRS) Nasopharyngeal Nasopharyngeal Swab     Status: None   Collection Time: 07/22/21  5:55 AM   Specimen: Nasopharyngeal Swab  Result Value Ref Range Status   SARS Coronavirus 2 NEGATIVE NEGATIVE Final    Comment: (NOTE) SARS-CoV-2 target nucleic acids are NOT DETECTED.  The SARS-CoV-2 RNA is generally detectable in upper and lower respiratory specimens during the acute phase of infection. Negative results do not preclude SARS-CoV-2 infection, do not rule out co-infections with other pathogens, and should not be used as the sole basis for treatment or other patient management decisions. Negative results must be combined with clinical observations, patient history, and epidemiological information. The expected result is Negative.  Fact  Sheet for Patients: SugarRoll.be  Fact Sheet for Healthcare Providers: https://www.woods-mathews.com/  This test is not yet approved or cleared by the [place] FDA and  has been authorized for detection and/or diagnosis of SARS-CoV-2 by FDA under an Emergency Use Authorization (EUA). This EUA will remain  in effect (meaning this test can be used) for the duration of the COVID-19 declaration under Se ction 564(b)(1) of the Act, 21 U.S.C. section 360bbb-3(b)(1), unless the authorization is terminated or revoked sooner.  Performed at [facility], [place] [address], [place], [place] [address]  Radiology Studies: ECHOCARDIOGRAM COMPLETE  Result Date: 07/22/2021    ECHOCARDIOGRAM REPORT   Patient Name:   KETTY BITTON Date of Exam: 07/22/2021 Medical Rec #:  [id number]       Height:       69.0 in Accession #:    [id number]      Weight:       311.5 lb Date of Birth:  1961/06/30       BSA:          2.494 m Patient Age:    57 years        BP:           170/101 mmHg Patient Gender: F               HR:           81 bpm. Exam Location:  Inpatient Procedure: 2D Echo, Cardiac Doppler and Color Doppler Indications:    R06.02 SOB  History:        Patient has prior history of Echocardiogram examinations, most                 recent 09/04/2020. CHF, COPD, Mitral Valve Disease; Risk                 Factors:Diabetes. Mitral regurgitation.  Sonographer:    Roseanna Rainbow RDCS Referring Phys: [id number] [facility]  Sonographer Comments: Technically difficult study due to poor echo windows and patient is morbidly obese. Image acquisition challenging due to patient body habitus. Patient talking throughout exam. IMPRESSIONS  1. Left ventricular ejection fraction, by estimation, is 50 to 55%. Left ventricular ejection fraction by 2D MOD biplane is 53.5 %. The left ventricle has low normal function. The left ventricle has no regional wall motion abnormalities.  There is moderate left ventricular hypertrophy. Left ventricular diastolic parameters are consistent with Grade I diastolic dysfunction (impaired relaxation). LV filling pressure apperas normal.  2. Right ventricular systolic function is normal. The right ventricular size is normal. There is mildly elevated pulmonary artery systolic pressure. The estimated right ventricular systolic pressure is 28.3 mmHg.  3. The mitral valve is abnormal. The leaflet tips are sclerotic. Mild to moderate mitral valve regurgitation. There is mild late systolic prolapse of of the mitral valve. (Image 95)  4. The aortic valve is tricuspid. Aortic valve regurgitation is not visualized.  5. The inferior vena cava is dilated in size with >50% respiratory variability, suggesting right atrial pressure of 8 mmHg. Comparison(s): No significant change from prior study. 09/04/2020: LVEF 50-55%, moderate MR. FINDINGS  Left Ventricle: Left ventricular ejection fraction, by estimation, is 50 to 55%. Left ventricular ejection fraction by 2D MOD biplane is 53.5 %. The left ventricle has low normal function. The left ventricle has no regional wall motion abnormalities. The left ventricular internal cavity size was normal in size. There is moderate left ventricular hypertrophy. Left ventricular diastolic parameters are consistent with Grade I diastolic dysfunction (impaired relaxation). Normal left ventricular filling pressure. Right Ventricle: The right ventricular size is normal. No increase in right ventricular wall thickness. Right ventricular systolic function is normal. There is mildly elevated pulmonary artery systolic pressure. The tricuspid regurgitant velocity is 2.68  m/s, and with an assumed right atrial pressure of 8 mmHg, the estimated right ventricular systolic pressure is 15.1 mmHg. Left Atrium: Left atrial size was normal in size. Right Atrium: Right atrial size was normal in size. Pericardium: There is no evidence of pericardial  effusion. Mitral Valve: The mitral  valve is abnormal. There is mild late systolic prolapse of of the mitral valve. There is mild thickening of the mitral valve leaflet(s). Mild to moderate mitral valve regurgitation. MV peak gradient, 6.7 mmHg. The mean mitral valve gradient is 3.0 mmHg. Tricuspid Valve: The tricuspid valve is grossly normal. Tricuspid valve regurgitation is trivial. Aortic Valve: The aortic valve is tricuspid. Aortic valve regurgitation is not visualized. Pulmonic Valve: The pulmonic valve was normal in structure. Pulmonic valve regurgitation is not visualized. Aorta: The aortic root and ascending aorta are structurally normal, with no evidence of dilitation. Venous: The inferior vena cava is dilated in size with greater than 50% respiratory variability, suggesting right atrial pressure of 8 mmHg. IAS/Shunts: No atrial level shunt detected by color flow Doppler.  LEFT VENTRICLE PLAX 2D                        Biplane EF (MOD) LVIDd:         5.00 cm         LV Biplane EF:   Left LVIDs:         3.60 cm                          ventricular LV PW:         1.40 cm                          ejection LV IVS:        1.80 cm                          fraction by LVOT diam:     2.10 cm                          2D MOD LV SV:         67                               biplane is LV SV Index:   27                               53.5 %. LVOT Area:     3.46 cm                                Diastology                                LV e' medial:    6.09 cm/s LV Volumes (MOD)               LV E/e' medial:  18.9 LV vol d, MOD    172.0 ml      LV e' lateral:   5.44 cm/s A2C:                           LV E/e' lateral: 21.1 LV vol d, MOD    159.0 ml A4C: LV vol s, MOD    63.8 ml A2C: LV vol s, MOD    92.6 ml A4C: LV SV  MOD A2C:   108.2 ml LV SV MOD A4C:   159.0 ml LV SV MOD BP:    90.5 ml RIGHT VENTRICLE             IVC RV S prime:     12.20 cm/s  IVC diam: 2.50 cm TAPSE (M-mode): 2.6 cm LEFT ATRIUM             Index        RIGHT ATRIUM          Index LA diam:        3.50 cm 1.40 cm/m  RA Area:     8.04 cm LA Vol (A2C):   56.3 ml 22.57 ml/m RA Volume:   10.70 ml 4.29 ml/m LA Vol (A4C):   71.9 ml 28.81 ml/m LA Biplane Vol: 59.8 ml 23.98 ml/m  AORTIC VALVE LVOT Vmax:   121.00 cm/s LVOT Vmean:  83.100 cm/s LVOT VTI:    0.192 m  AORTA Ao Root diam: 3.30 cm Ao Asc diam:  3.60 cm MITRAL VALVE                 TRICUSPID VALVE MV Area (PHT): 5.13 cm      TR Peak grad:   28.7 mmHg MV Area VTI:   2.36 cm      TR Vmax:        268.00 cm/s MV Peak grad:  6.7 mmHg MV Mean grad:  3.0 mmHg      SHUNTS MV Vmax:       1.29 m/s      Systemic VTI:  0.19 m MV Vmean:      77.6 cm/s     Systemic Diam: 2.10 cm MV Decel Time: 148 msec MR Peak grad:    143.5 mmHg MR Mean grad:    96.0 mmHg MR Vmax:         599.00 cm/s MR Vmean:        459.0 cm/s MR PISA:         1.27 cm MR PISA Eff ROA: 7 mm MR PISA Radius:  0.45 cm MV E velocity: 115.00 cm/s MV A velocity: 141.00 cm/s MV E/A ratio:  0.82 Lyman Bishop MD Electronically signed by Lyman Bishop MD Signature Date/Time: 07/22/2021/4:27:18 PM    Final         Scheduled Meds:  amLODipine  10 mg Oral Daily   carvedilol  6.25 mg Oral BID WC   enoxaparin (LOVENOX) injection  70 mg Subcutaneous Q24H   furosemide  40 mg Oral Daily   insulin aspart  0-5 Units Subcutaneous QHS   insulin aspart  0-9 Units Subcutaneous TID WC   ketotifen  1 drop Both Eyes BID   mometasone-formoterol  2 puff Inhalation BID   polyethylene glycol  17 g Oral Daily   sacubitril-valsartan  1 tablet Oral BID   senna-docusate  2 tablet Oral BID   sodium chloride flush  3 mL Intravenous Q12H   Vitamin D (Ergocalciferol)  50,000 Units Oral Weekly   Continuous Infusions:  sodium chloride       LOS: 2 days   Time spent: 70mins Greater than 50% of this time was spent in counseling, explanation of diagnosis, planning of further management, and coordination of care.   Voice Recognition Viviann Spare dictation system was  used to create this note, attempts have been made to correct errors. Please contact the author with questions and/or clarifications.   Florencia Reasons, MD PhD FACP [facility]  Available via Epic secure chat 7am-7pm for nonurgent issues Please page for urgent issues To page the attending provider between 7A-7P or the covering provider during after hours 7P-7A, please log into the web site [email or web address] and access using universal [place] password for that web site. If you do not have the password, please call the hospital operator.    07/24/2021, 11:36 AM

## 2021-07-24 NOTE — Consult Note (Addendum)
Cardiology Consultation:   Patient ID: Araiya Tilmon MRN: [id number]; DOB: [birth year]  Admit date: 07/22/2021 Date of Consult: 07/24/2021  PCP:  [facility] [place] [facility] Cardiologist:  Buford Dresser, MD   {   Patient Profile:   Jaely Silman is a 60 y.o. female with a hx of hypertension, chronic diastolic heart failure, COPD/asthma, moderate mitral regurgitation and tricuspid regurgitation, type 2 diabetes, pulmonary hypertension, morbid obesity, former tobacco abuse, OSA on CPAP, multiple intolerance to medication (including carvedilol/ACEi/ARB ), who is being seen 07/24/2021 for the evaluation of CHF at the request of Dr. Karleen Hampshire.  History of Present Illness:   Ms. Hitz remotely followed up with  Dr. Martinique and later switched to Dr. Harrell Gave since November 2021 hospitalization for acute on chronic diastolic heart failure.  She was recommended right heart catheterization for further evaluation of etiology of pulmonary hypertension however patient refused.  Echo from 09/04/2020 showed EF 50 to 55%, no RMWA, indeterminate diastolic filling, equal systolic and diastolic pulmonary vein flow suspect that MR is not severe, quantitative assessment is limited by image quality and eccentric nature of MR jet, moderate MR. She was seen by Dr. Harrell Gave, upon review of imaging, MR was felt moderate/not significant contributing to her respiratory symptoms.  Decision was made not to pursue TEE due to elevated risk. She was diuresed with IV Lasix and transition to p.o. torsemide 40mg  and spironolactone 25mg  at discharge.  She was last seen in the office on 09/17/2020, was having good urine output on torsemide, declined adding SGLT2i for GDMT.  She was lost to follow-up since.   She presented to the ER on 07/22/2021, complaining shortness of breath for 3 days, has not been taking her Lasix in for 3 days due to misplaced bottle.  She reported dyspnea on exertion,  nonproductive cough, and associated chest pain.  She reports getting easily winded when walking across the room.  She also reports bilateral lower extremity swelling, endorses orthopnea, had to sleep in a recliner instead of her bed.  She reports developing pruritus and hives while taking torsemide where she had stopped the medication.  She is tolerating Lasix. she has not been using her CPAP over the last few month due to broken machine.   Admission diagnostic revealed elevated creatinine 1.02, hyperglycemia 176 on CMP.  High sensitive troponin negative x2, BNP WNL 42.8.  CBC revealed mild leukocytosis with WBC 11 800, anemia with hemoglobin 10.9, and platelet 433k.  Hemoglobin A1c 8.6%.  Urinalysis grossly unremarkable. Chest x-ray revealed cardiomegaly with vascular congestion.  EKG from 07/22/2021 02:13 revealed sinus tachycardia with ventricular rate of 104bpm, LVH, no acute ischemic change.  COVID-19 swab negative.  She is hypertensive with blood pressure up to 177/99 at admission, required 2 L nasal cannula oxygen support.  She was concerned for decompensated heart failure, subsequently admitted to hospital medicine service, started IV Lasix 40 mg twice daily.  Echo completed on 9/26 revealing EF 50 to 55%, no RMWA, moderate LVH, grade 1 DD, mildly elevated PASP with RVSP 36.7 mmHg, mild to moderate MR and mild late systolic prolapse of mitral valve, overall no significant change comparing to study on 09/04/2020.  Subsequent labs revealed improving renal index, persistent mild anemia and negative FOBT. Cardiology is consulted today for further input.   During encounter, patient states she is much improved with her SOB. She thought she was taking Lasix at home, but actually was taking Torsemide, she thought they are the same medication. She states  she had months onset of itchiness, hives while taking torsemide. She was off the medication for 3 days because she could not find the pill bottle. She confirmed  SOB with exertion, leg swelling, and orthopnea. She states symptoms are all improved now, she had urinated a lot and tolerating Lasix without allergic reaction. She denied any chest pain or pressure, syncope at home. She was seen a sleep doctor at [facility] and was referred to cardiology there, but never saw the doctor. She wants to come back and follow up with Dr Harrell Gave. She said she had tried Jardiance in the past and it was making her getting yeast infection. She felt her urine is strong today and wants to know if she has infection and wants Diflucan.    Past Medical History:  Diagnosis Date   Asthma    B12 deficiency    CHF (congestive heart failure) (HCC)    COPD (chronic obstructive pulmonary disease) (HCC)    Diabetes mellitus without complication (HCC)    Hypertension    Morbid obesity (HCC)    SOB (shortness of breath)    Urinary incontinence     Past Surgical History:  Procedure Laterality Date   CESAREAN SECTION     ECTOPIC PREGNANCY SURGERY     KNEE SURGERY       Home Medications:  Prior to Admission medications   Medication Sig Start Date End Date Taking? Authorizing Provider  acetaminophen (TYLENOL) 650 MG CR tablet Take 650 mg by mouth every 8 (eight) hours as needed for pain.   Yes [provider]  albuterol (PROVENTIL) (2.5 MG/3ML) 0.083% nebulizer solution Take 3 mLs (2.5 mg total) by nebulization every 6 (six) hours as needed for wheezing or shortness of breath. 03/21/17  Yes Robinson, Martinique N, PA-C  albuterol (VENTOLIN HFA) 108 (90 Base) MCG/ACT inhaler Inhale 2 puffs into the lungs every 6 (six) hours as needed for wheezing or shortness of breath. 11/05/13  Yes [provider]  amLODipine (NORVASC) 5 MG tablet Take 5 mg by mouth daily. 07/09/21  Yes [provider]  ergocalciferol (VITAMIN D2) 1.25 MG (50000 UT) capsule Take 50,000 Units by mouth once a week.   Yes [provider]  glipiZIDE (GLUCOTROL XL) 5 MG 24 hr  tablet Take 5 mg by mouth every evening.  08/24/20  Yes [provider]  GUAIFENESIN PO Take 1 tablet by mouth daily as needed (CONGESTION).   Yes [provider]  hydrocortisone 2.5 % cream Apply 1 application topically 3 (three) times daily as needed for itching.   Yes [provider]  OXYGEN Inhale 2 L into the lungs as needed (shortness of breath).   Yes [provider]  SYMBICORT 80-4.5 MCG/ACT inhaler Inhale 2 puffs into the lungs 2 (two) times daily. 11/07/17  Yes [provider]  torsemide (DEMADEX) 20 MG tablet TAKE 2 TABLETS(40 MG) BY MOUTH TWICE DAILY Patient taking differently: Take 40 mg by mouth 2 (two) times daily. 03/11/21  Yes Buford Dresser, MD  spironolactone (ALDACTONE) 25 MG tablet Take 25 mg by mouth daily. Patient not taking: Reported on 07/22/2021 10/31/19   [provider]    Inpatient Medications: Scheduled Meds:  amLODipine  10 mg Oral Daily   carvedilol  6.25 mg Oral BID WC   enoxaparin (LOVENOX) injection  70 mg Subcutaneous Q24H   furosemide  40 mg Oral Daily   insulin aspart  0-5 Units Subcutaneous QHS   insulin aspart  0-9 Units Subcutaneous  TID WC   ketotifen  1 drop Both Eyes BID   mometasone-formoterol  2 puff Inhalation BID   polyethylene glycol  17 g Oral Daily   sacubitril-valsartan  1 tablet Oral BID   senna-docusate  2 tablet Oral BID   sodium chloride flush  3 mL Intravenous Q12H   Vitamin D (Ergocalciferol)  50,000 Units Oral Weekly   Continuous Infusions:  sodium chloride     PRN Meds: sodium chloride, acetaminophen **OR** acetaminophen, albuterol, naphazoline-glycerin, sodium chloride flush, traZODone  Allergies:    Allergies  Allergen Reactions   Contrast Media [Iodinated Diagnostic Agents] Other (See Comments)    Coughing, sick to stomach, increase blood pressure    Empagliflozin Other (See Comments)    Yeast infection   Losartan Other (See Comments)    Back pain    Torsemide Hives   Lisinopril Nausea And Vomiting   Metformin Palpitations   Ondansetron Other (See Comments) and Swelling    Social History:   Social History   Socioeconomic History   Marital status: Single    Spouse name: Not on file   Number of children: 4   Years of education: Not on file   Highest education level: Not on file  Occupational History   Not on file  Tobacco Use   Smoking status: Former    Packs/day: 1.50    Years: 30.00    Pack years: 45.00    Types: Cigarettes    Quit date: 10/27/2010    Years since quitting: 10.7   Smokeless tobacco: Never  Substance and Sexual Activity   Alcohol use: No   Drug use: No   Sexual activity: Not Currently  Other Topics Concern   Not on file  Social History Narrative   Not on file   Social Determinants of Health   Financial Resource Strain: Not on file  Food Insecurity: Not on file  Transportation Needs: Not on file  Physical Activity: Not on file  Stress: Not on file  Social Connections: Not on file  Intimate Partner Violence: Not on file    Family History:    Family History  Problem Relation Age of Onset   Stroke Mother    Asthma Mother      ROS:  Constitutional: Denied fever, chills, malaise, night sweats Eyes: Denied vision change or loss Ears/Nose/Mouth/Throat: Denied ear ache, sore throat, sinus pain Cardiovascular: see HPI  Respiratory: see HPI  Gastrointestinal: Denied nausea, vomiting, abdominal pain, diarrhea Genital/Urinary: strong odor of urine  Musculoskeletal: Denied muscle ache, joint pain, weakness Skin: Denied rash, wound Neuro: Denied headache, dizziness, syncope Psych: Denied history of depression/anxiety  Endocrine: history of diabetes   Physical Exam/Data:   Vitals:   07/24/21 0539 07/24/21 0843 07/24/21 0942 07/24/21 0949  BP: 116/82  127/77   Pulse: 91  88 89  Resp: 18  16   Temp: 98.2 F (36.8 C)  98.2 F (36.8 C)   TempSrc: Oral  Oral   SpO2: 91% 92% 95%   Weight:       Height:        Intake/Output Summary (Last 24 hours) at 07/24/2021 1131 Last data filed at 07/24/2021 1740 Gross per 24 hour  Intake 900 ml  Output --  Net 900 ml   Last 3 Weights 07/24/2021 07/23/2021 07/22/2021  Weight (lbs) 306 lb 14.1 oz 306 lb 6.4 oz 311 lb 8.2 oz  Weight (kg) 139.2 kg 138.982 kg 141.3 kg     Body mass index is 45.[place]  kg/m.   Vitals:  Vitals:   07/24/21 0942 07/24/21 0949  BP: 127/77   Pulse: 88 89  Resp: 16   Temp: 98.2 F (36.8 C)   SpO2: 95%    General Appearance: In no apparent distress, laying in bed, obese  HEENT: Normocephalic, atraumatic. EOMs intact.  Neck: Supple, trachea midline, no JVDs Cardiovascular: Regular rate and rhythm, normal S1-S2,  no murmur/rub/gallop Respiratory: Resting breathing unlabored, lungs sounds clear to auscultation bilaterally, no use of accessory muscles. On room air.  No wheezes, rales or rhonchi.   Gastrointestinal: Bowel sounds positive, abdomen soft, non-tender, non-distended.  Extremities: Able to move all extremities in bed without difficulty, trace edema of BLE Genitourinary:  genital exam not performed Musculoskeletal: Normal muscle bulk and tone Skin: Intact, warm, dry. No rashes or petechiae noted in exposed areas.  Neurologic: Alert, oriented to person, place and time. Fluent speech,  no cognitive deficit, no gross focal neuro deficit Psychiatric: Normal affect. Mood is appropriate. Limited insight.    EKG:  The EKG was personally reviewed and demonstrates:  EKG from 07/22/2021 02:13 revealed sinus tachycardia with ventricular rate of 104bpm, LVH, no acute ischemic change.   Telemetry:  Telemetry was personally reviewed and demonstrates:  SR/ST with 90-106s, artifacts  Relevant CV Studies:  Echocardiogram from 07/22/2021:   1. Left ventricular ejection fraction, by estimation, is 50 to 55%. Left  ventricular ejection fraction by 2D MOD biplane is 53.5 %. The left  ventricle has low normal function. The  left ventricle has no regional wall  motion abnormalities. There is  moderate left ventricular hypertrophy. Left ventricular diastolic  parameters are consistent with Grade I diastolic dysfunction (impaired  relaxation). LV filling pressure apperas normal.   2. Right ventricular systolic function is normal. The right ventricular  size is normal. There is mildly elevated pulmonary artery systolic  pressure. The estimated right ventricular systolic pressure is 53.2 mmHg.   3. The mitral valve is abnormal. The leaflet tips are sclerotic. Mild to  moderate mitral valve regurgitation. There is mild late systolic prolapse  of of the mitral valve. (Image 95)   4. The aortic valve is tricuspid. Aortic valve regurgitation is not  visualized.   5. The inferior vena cava is dilated in size with >50% respiratory  variability, suggesting right atrial pressure of 8 mmHg.   Comparison(s): No significant change from prior study. 09/04/2020: LVEF  50-55%, moderate MR.    Laboratory Data:  High Sensitivity Troponin:   Recent Labs  Lab 07/22/21 0206 07/22/21 0416  TROPONINIHS 12 12     Chemistry Recent Labs  Lab 07/22/21 0206 07/23/21 0431 07/24/21 0419  NA 140 139 143  K 4.0 3.8 3.8  CL 104 101 102  CO2 29 31 33*  GLUCOSE 176* 185* 169*  BUN 17 17 20   CREATININE 1.02* 0.78 0.82  CALCIUM 9.2 9.1 9.8  GFRNONAA >60 >60 >60  ANIONGAP 7 7 8     Recent Labs  Lab 07/22/21 0206  PROT 7.6  ALBUMIN 3.3*  AST 16  ALT 22  ALKPHOS 101  BILITOT 0.4   Lipids No results for input(s): CHOL, TRIG, HDL, LABVLDL, LDLCALC, CHOLHDL in the last 168 hours.  Hematology Recent Labs  Lab 07/22/21 0206 07/23/21 0431 07/24/21 0419  WBC 11.8*  --  9.9  RBC 4.21 4.34 4.49  HGB 10.9*  --  11.6*  HCT 36.3  --  37.6  MCV 86.2  --  83.7  MCH 25.9*  --  25.8*  MCHC 30.0  --  30.9  RDW 16.1*  --  16.5*  PLT 433*  --  429*   Thyroid No results for input(s): TSH, FREET4 in the last 168 hours.   BNP Recent Labs  Lab 07/22/21 0211  BNP 42.8    DDimer No results for input(s): DDIMER in the last 168 hours.   Radiology/Studies:  DG Chest 2 View  Result Date: 07/22/2021 CLINICAL DATA:  Shortness of breath EXAM: CHEST - 2 VIEW COMPARISON:  04/22/2021 FINDINGS: Cardiomegaly, vascular congestion. No confluent opacities, effusions or overt edema. No acute bony abnormality. IMPRESSION: Cardiomegaly, vascular congestion. Electronically Signed   By: Rolm Baptise M.D.   On: 07/22/2021 03:44   ECHOCARDIOGRAM COMPLETE  Result Date: 07/22/2021    ECHOCARDIOGRAM REPORT   Patient Name:   MARQUITTA PERSICHETTI Date of Exam: 07/22/2021 Medical Rec #:  [id number]       Height:       69.0 in Accession #:    [id number]      Weight:       311.5 lb Date of Birth:  03/01/1961       BSA:          2.494 m Patient Age:    49 years        BP:           170/101 mmHg Patient Gender: F               HR:           81 bpm. Exam Location:  Inpatient Procedure: 2D Echo, Cardiac Doppler and Color Doppler Indications:    R06.02 SOB  History:        Patient has prior history of Echocardiogram examinations, most                 recent 09/04/2020. CHF, COPD, Mitral Valve Disease; Risk                 Factors:Diabetes. Mitral regurgitation.  Sonographer:    Roseanna Rainbow RDCS Referring Phys: [id number] [facility]  Sonographer Comments: Technically difficult study due to poor echo windows and patient is morbidly obese. Image acquisition challenging due to patient body habitus. Patient talking throughout exam. IMPRESSIONS  1. Left ventricular ejection fraction, by estimation, is 50 to 55%. Left ventricular ejection fraction by 2D MOD biplane is 53.5 %. The left ventricle has low normal function. The left ventricle has no regional wall motion abnormalities. There is moderate left ventricular hypertrophy. Left ventricular diastolic parameters are consistent with Grade I diastolic dysfunction (impaired relaxation). LV filling pressure apperas  normal.  2. Right ventricular systolic function is normal. The right ventricular size is normal. There is mildly elevated pulmonary artery systolic pressure. The estimated right ventricular systolic pressure is 78.6 mmHg.  3. The mitral valve is abnormal. The leaflet tips are sclerotic. Mild to moderate mitral valve regurgitation. There is mild late systolic prolapse of of the mitral valve. (Image 95)  4. The aortic valve is tricuspid. Aortic valve regurgitation is not visualized.  5. The inferior vena cava is dilated in size with >50% respiratory variability, suggesting right atrial pressure of 8 mmHg. Comparison(s): No significant change from prior study. 09/04/2020: LVEF 50-55%, moderate MR. FINDINGS  Left Ventricle: Left ventricular ejection fraction, by estimation, is 50 to 55%. Left ventricular ejection fraction by 2D MOD biplane is 53.5 %. The left ventricle has low normal function. The left ventricle has no regional wall motion abnormalities.  The left ventricular internal cavity size was normal in size. There is moderate left ventricular hypertrophy. Left ventricular diastolic parameters are consistent with Grade I diastolic dysfunction (impaired relaxation). Normal left ventricular filling pressure. Right Ventricle: The right ventricular size is normal. No increase in right ventricular wall thickness. Right ventricular systolic function is normal. There is mildly elevated pulmonary artery systolic pressure. The tricuspid regurgitant velocity is 2.68  m/s, and with an assumed right atrial pressure of 8 mmHg, the estimated right ventricular systolic pressure is 09.8 mmHg. Left Atrium: Left atrial size was normal in size. Right Atrium: Right atrial size was normal in size. Pericardium: There is no evidence of pericardial effusion. Mitral Valve: The mitral valve is abnormal. There is mild late systolic prolapse of of the mitral valve. There is mild thickening of the mitral valve leaflet(s). Mild to moderate  mitral valve regurgitation. MV peak gradient, 6.7 mmHg. The mean mitral valve gradient is 3.0 mmHg. Tricuspid Valve: The tricuspid valve is grossly normal. Tricuspid valve regurgitation is trivial. Aortic Valve: The aortic valve is tricuspid. Aortic valve regurgitation is not visualized. Pulmonic Valve: The pulmonic valve was normal in structure. Pulmonic valve regurgitation is not visualized. Aorta: The aortic root and ascending aorta are structurally normal, with no evidence of dilitation. Venous: The inferior vena cava is dilated in size with greater than 50% respiratory variability, suggesting right atrial pressure of 8 mmHg. IAS/Shunts: No atrial level shunt detected by color flow Doppler.  LEFT VENTRICLE PLAX 2D                        Biplane EF (MOD) LVIDd:         5.00 cm         LV Biplane EF:   Left LVIDs:         3.60 cm                          ventricular LV PW:         1.40 cm                          ejection LV IVS:        1.80 cm                          fraction by LVOT diam:     2.10 cm                          2D MOD LV SV:         67                               biplane is LV SV Index:   27                               53.5 %. LVOT Area:     3.46 cm                                Diastology  LV e' medial:    6.09 cm/s LV Volumes (MOD)               LV E/e' medial:  18.9 LV vol d, MOD    172.0 ml      LV e' lateral:   5.44 cm/s A2C:                           LV E/e' lateral: 21.1 LV vol d, MOD    159.0 ml A4C: LV vol s, MOD    63.8 ml A2C: LV vol s, MOD    92.6 ml A4C: LV SV MOD A2C:   108.2 ml LV SV MOD A4C:   159.0 ml LV SV MOD BP:    90.5 ml RIGHT VENTRICLE             IVC RV S prime:     12.20 cm/s  IVC diam: 2.50 cm TAPSE (M-mode): 2.6 cm LEFT ATRIUM             Index       RIGHT ATRIUM          Index LA diam:        3.50 cm 1.40 cm/m  RA Area:     8.04 cm LA Vol (A2C):   56.3 ml 22.57 ml/m RA Volume:   10.70 ml 4.29 ml/m LA Vol (A4C):   71.9 ml 28.81  ml/m LA Biplane Vol: 59.8 ml 23.98 ml/m  AORTIC VALVE LVOT Vmax:   121.00 cm/s LVOT Vmean:  83.100 cm/s LVOT VTI:    0.192 m  AORTA Ao Root diam: 3.30 cm Ao Asc diam:  3.60 cm MITRAL VALVE                 TRICUSPID VALVE MV Area (PHT): 5.13 cm      TR Peak grad:   28.7 mmHg MV Area VTI:   2.36 cm      TR Vmax:        268.00 cm/s MV Peak grad:  6.7 mmHg MV Mean grad:  3.0 mmHg      SHUNTS MV Vmax:       1.29 m/s      Systemic VTI:  0.19 m MV Vmean:      77.6 cm/s     Systemic Diam: 2.10 cm MV Decel Time: 148 msec MR Peak grad:    143.5 mmHg MR Mean grad:    96.0 mmHg MR Vmax:         599.00 cm/s MR Vmean:        459.0 cm/s MR PISA:         1.27 cm MR PISA Eff ROA: 7 mm MR PISA Radius:  0.45 cm MV E velocity: 115.00 cm/s MV A velocity: 141.00 cm/s MV E/A ratio:  0.82 Lyman Bishop MD Electronically signed by Lyman Bishop MD Signature Date/Time: 07/22/2021/4:27:18 PM    Final      Assessment and Plan:   Acute hypoxic respiratory failure, resolved  -Weaned to room air today with pulse ox 91%, consider ambulatory pulse ox monitor before discharge   Acute on chronic diastolic heart failure Medication noncompliance Moderate mitral regurgitation  - Presented with 3 days shortness of breath, leg swelling, orthopnea, and hypoxia while off Lasix - BNP WNL, POA - Hs Trop negative x2, POA - CXR with Vascular congestion, POA - Echo 07/22/21 with preserved EF, largely unchanged from 11/21 - s/p IV Lasix  40mg  BID for 2 days, weight down 311>306, UOP 1122ml over 24 hrs, Net not documented  - clinically euvolemic today  - Discussed the need of medication compliance, CHF self-care, daily weight monitor, signs and symptoms of exacerbation sodium diet - GDMT: ok to continue current regimen carvedilol 6.25mg  bid (seems tolerating without issue/hx of intolerance reported), Entresto 24/26 BID, refusing resume spironolactone 25 mg daily due to back pain, will not add SGLT2i due to hx of GU infection  - will  transition to PO lasix 40mg  daily , stop torsemide due to subjective reports of allergy  - follow up arranged on 08/08/21 with cardiology   HTN  - Historically takes amlodipine 5 mg daily, spironolactone 25 mg daily, torsemide 40mg  BID at home - she does not track BP at home, was hypertensive initially here, suspect was not controlled at home  - agree with amlodipine 10mg  daily, coreg 6.25mg  BID, Entresto 24/26 bid, won't resume spironolactone 25 mg daily due to back pain  OSA noncompliant with CPAP -Emphasized importance of compliance, needs new machine   Mild pulmonary hypertension -Refused right heart catheterization in the past, suspect due to underlying lung disease +/- CHF  COPD without exacerbation Anemia Type 2 diabetes with hyperglycemia Morbid obesity -Managed per IM    Risk Assessment/Risk Scores:   New York Heart Association (NYHA) Functional Class NYHA Class II      For questions or updates, please contact [facility] Please consult [email or web address] for contact info under    Signed, Margie Billet, NP  07/24/2021 11:31 AM  Patient seen and examined with Margie Billet NP.  Agree as above, with the following exceptions and changes as noted below.  60 year old female admitted for acute on chronic diastolic heart failure exacerbation in the setting of stopping her torsemide due to concern that it was causing hives, though unclear if this was related to a different medication.  Since she has been off of torsemide in the hospital she feels her hives have resolved.  She has excellent diuresis with torsemide and feels her weights remain stable when she takes this medication.  She does feel she has some bladder prolapse which contributes to some incontinence with aggressive diuresis.  She has tried steroid intravaginal therapy per her report with improvement in her incontinence symptoms.  We discussed reviewing again with urology as an outpatient for possible definitive management so  that we can facilitate likely needed daily diuresis. Gen: NAD, CV: RRR, no murmurs, Lungs: clear, Abd: soft, Extrem: Warm, well perfused, diffuse bilateral edema, Neuro/Psych: alert and oriented x 3, normal mood and affect. All available labs, radiology testing, previous records reviewed.  This is a difficult situation since she had a suboptimal response to Lasix as an outpatient.  She diuresis very well on torsemide but now is endorsing hives and is reluctant to resume torsemide.  Certainly could consider Bumex as an outpatient but she is not in favor of more aggressive diuresis given current difficulty with urinary incontinence.  It may be best to return to Lasix p.o. prescription for a trial of short-term outpatient diuresis, with close follow-up.  We discussed heart failure compliant diet in detail, continued activity.  It is unclear what her reaction to torsemide was but it does not sound like a sulfa allergy, however can consider ethacrynic acid if affordable for the patient for continued optimal diuresis if Lasix is unsuccessful.  Remainder as above. Discussed in detail with patient.   Elouise Munroe, MD 07/24/21 4:40 PM

## 2021-07-25 LAB — BASIC METABOLIC PANEL
Anion gap: 8 (ref 5–15)
BUN: 19 mg/dL (ref 8–23)
CO2: 31 mmol/L (ref 22–32)
Calcium: 9.4 mg/dL (ref 8.9–10.3)
Chloride: 103 mmol/L (ref 98–111)
Creatinine, Ser: 0.92 mg/dL (ref 0.44–1.00)
GFR, Estimated: >60 mL/min (ref >60)
Glucose, Bld: 179 mg/dL — ABNORMAL HIGH (ref 70–99)
Potassium: 3.7 mmol/L (ref 3.5–5.1)
Sodium: 142 mmol/L (ref 135–145)

## 2021-07-25 LAB — GLUCOSE, CAPILLARY
Glucose-Capillary: 173 mg/dL — ABNORMAL HIGH (ref 70–99)
Glucose-Capillary: 165 mg/dL — ABNORMAL HIGH (ref 70–99)

## 2021-07-25 MED ORDER — BLOOD GLUCOSE MONITOR KIT: PACK | 0 refills | Status: AC

## 2021-07-25 MED ORDER — SACUBITRIL-VALSARTAN 24-26 MG PO TABS: 1.0000 | ORAL_TABLET | Freq: Two times a day (BID) | ORAL | 0 refills | Status: DC

## 2021-07-25 MED ORDER — POLYETHYLENE GLYCOL 3350 17 G PO PACK: 17.0000 g | PACK | Freq: Every day | ORAL | 0 refills | Status: DC

## 2021-07-25 MED ORDER — SENNOSIDES-DOCUSATE SODIUM 8.6-50 MG PO TABS: 1.0000 | ORAL_TABLET | Freq: Every day | ORAL | 0 refills | Status: DC

## 2021-07-25 MED ORDER — AMLODIPINE BESYLATE 10 MG PO TABS: 10.0000 mg | ORAL_TABLET | Freq: Every day | ORAL | 0 refills | Status: DC

## 2021-07-25 MED ORDER — FUROSEMIDE 80 MG PO TABS: 80.0000 mg | ORAL_TABLET | Freq: Two times a day (BID) | ORAL | 0 refills | Status: DC

## 2021-07-25 MED ORDER — CARVEDILOL 6.25 MG PO TABS: 6.2500 mg | ORAL_TABLET | Freq: Two times a day (BID) | ORAL | 0 refills | Status: DC

## 2021-07-25 MED ORDER — FERROUS SULFATE 325 (65 FE) MG PO TABS: 325.0000 mg | ORAL_TABLET | Freq: Every day | ORAL | 0 refills | Status: AC

## 2021-07-25 MED ORDER — FLUCONAZOLE 150 MG PO TABS: 150.0000 mg | ORAL_TABLET | Freq: Once | ORAL | 0 refills | Status: AC

## 2021-07-25 NOTE — Discharge Summary (Addendum)
Discharge Summary  Kathryn Mahoney EHU:[id number] DOB: 1961-04-22  PCP: [facility]  Admit date: 07/22/2021 Discharge date: 07/25/2021  Time spent: 28mns, more than 50% time spent on coordination of care.   Recommendations for Outpatient Follow-up:  F/u with PCP within a week  for hospital discharge follow up, repeat cbc/bmp at follow up F/u with cardiology on 10/13 to monitor heart failure regimen, Dr AMargaretann Lovelessordered lasix 820mbid Patient is advised to monitor blood glucose and bring in blood glucose record for pcp to review Patient is advised to check daily weight as well   Continue home 02, continue cpap  Discharge Diagnoses:  Active Hospital Problems   Diagnosis Date Noted   Acute on chronic heart failure with preserved ejection fraction (HFpEF) ([facility]09/26/2022   Diabetes mellitus type 2, uncontrolled (HCL[facility]09/26/2022   Orthopnea 07/22/2021   Obesity, Class III, BMI 40-49.9 (morbid obesity) ([facility]09/26/2022   COPD (chronic obstructive pulmonary disease) ([facility]   Hypertension     Resolved Hospital Problems  No resolved problems to display.    Discharge Condition: stable  Diet recommendation: heart healthy/carb modified  Filed Weights   07/23/21 0500 07/24/21 0500 07/25/21 0500  Weight: (!) 139 kg (!) 139.2 kg (!) 139 kg    History of present illness: ( per admitting MD Dr ChTonie GriffithChief Complaint: SOB   HPI: Kathryn Belinskys a [age].o. female with medical history significant for HFpEF, DMT2, HTN, COPD, OSA, morbid obesity who presents for evaluation of shortness of breath.  She reports she has been having shortness of breath for the last week but has been worse in the last 2 to 3 days.  She states he gets very short of breath even walking across her room and she has to stop and rest after only a few steps.  She reports has been having swelling in her legs.  She states that she is not able to lay flat for the last 3 nights and is having to sleep in a  recliner which is not normal for her.  She does have a history of OSA but states her CPAP machine is broken so she has not been using it for the last few months.  She reports she has diabetes mellitus but she has not been checking her blood sugars at home.  She states she has been on Demadex for diuresis but this caused her to have itching and hives so she stopped taking it.  She is taking Lasix in the past and tolerated it well but did not have any at home.  She reports shortness of breath with exertion and has a mild nonproductive cough.  She has not had any fever or chills.  She denies any sick contacts.  States she is not able to do her normal daily activities or play with her grandkids due to the shortness of breath.  She reports that she has been feeling generalized weakness but has not had any loss of consciousness or falls.  She does not use oxygen at home.  She states she has been using her inhalers but they have not helped. She denies tobacco alcohol or or illicit drug use   ED Course: In the emergency room she has had elevated blood pressure and mild tachycardia.  She has not had any chest pain.  She was given dose of Lasix with good diuresis in the emergency room.  She had vascular congestion and cardiomegaly on chest x-ray with a normal BNP and normal troponin.  ER physician was going to send her home but she stated she did not feel well enough to go home.  Lab work revealed WBC of 11,800 hemoglobin 10.9 hematocrit 36.3 platelets 433,000 sodium 140 potassium 4.0 chloride 104 bicarb 29 creatinine 1.02 BUN 17 glucose 176 calcium 9.2 alk phos is 101 AST 16 ALT 22 bilirubin 0.4 BNP 42.8 troponin 12 urinalysis unremarkable.  Hospitalist service was asked to admit for further management  Hospital Course:  Principal Problem:   Acute on chronic heart failure with preserved ejection fraction (HFpEF) (HCC) Active Problems:   COPD (chronic obstructive pulmonary disease) ([place])   Hypertension   Diabetes  mellitus type 2, uncontrolled ([place])   Orthopnea   Obesity, Class III, BMI 40-49.9 (morbid obesity) ([place])   Acute hypoxic respiratory failure likely secondary to acute on chronic diastolic CHF exacerbation -Improving on IV Lasix -Cardiology input appreciated, she is currently on Entresto, Coreg, Lasix -Patient does not want torsemide, she think torsemide caused hives, she does not want spironolactone due to low back pain while taking it, she does not want SGLT2 inhibitor due to GU infection -cardiology ordered lasix 62m bid at discharge, patient is advised to follow up with cardiology closely to monitor response , further diuretic adjustment per cardiology   HTN Presents with hypertension emergency -Blood pressure has improved on current regimen -She also report has been noncompliance with CPAP at home   Mild pulmonary hypertension Refused right heart cath in the past   Copd, stopped smoking since 2010   OSA noncompliant with CPAP  Reports home CPAP not working,  Case manager consulted    Noninsulin-dependent type 2 diabetes, uncontrolled with hyperglycemia A1c 8.6% On glipizide at home, reports she does not take it consistently, encourage taking as prescribed, also encourage diet control and weight loss She states she does not have glucometer or testing strips, prescription written Report  metformin cause palpitation Does not want SGIT2 inhibitor due to gu infection F/u with pcp to  discuss with regarding dpp4 inhibitor or GLP agonist , for now encourage die modifications, weight loss and adherence to glipizide        Body mass index is 45.32 kg/m..use to see weight loss doctor      Procedures: none  Consultations: Cardiology   Discharge Exam: BP 123/79 (BP Location: Right Arm)   Pulse 85   Temp 97.9 F (36.6 C) (Oral)   Resp 16   Ht _0  (1.753 m)   Wt (!) 139 kg   SpO2 93%   BMI 45.25 kg/m   General: NAD Cardiovascular: RRR, no edema Respiratory:  CTABL  Discharge Instructions You were cared for by a hospitalist during your hospital stay. If you have any questions about your discharge medications or the care you received while you were in the hospital after you are discharged, you can call the unit and asked to speak with the hospitalist on call if the hospitalist that took care of you is not available. Once you are discharged, your primary care physician will handle any further medical issues. Please note that NO REFILLS for any discharge medications will be authorized once you are discharged, as it is imperative that you return to your primary care physician (or establish a relationship with a primary care physician if you do not have one) for your aftercare needs so that they can reassess your need for medications and monitor your lab values.  Discharge Instructions     Diet - low sodium heart healthy  Complete by: As directed    Carb modified diet   Discharge instructions   Complete by: As directed    Please weigh yourself daily, please call your doctor if you weight goes up by 3lbs in three days   Discharge instructions   Complete by: As directed    Please check your blood glucose three times a day, bring in blood glucose records for your pcp to review.  Please watch your diet for blood glucose control.   Increase activity slowly   Complete by: As directed       Allergies as of 07/25/2021       Reactions   Contrast Media [iodinated Diagnostic Agents] Other (See Comments)   Coughing, sick to stomach, increase blood pressure    Empagliflozin Other (See Comments)   Yeast infection   Losartan Other (See Comments)   Back pain   Torsemide Hives   Lisinopril Nausea And Vomiting   Metformin Palpitations   Ondansetron Other (See Comments), Swelling        Medication List     STOP taking these medications    spironolactone 25 MG tablet Commonly known as: ALDACTONE   torsemide 20 MG tablet Commonly known as: DEMADEX        TAKE these medications    acetaminophen 650 MG CR tablet Commonly known as: TYLENOL Take 650 mg by mouth every 8 (eight) hours as needed for pain.   albuterol 108 (90 Base) MCG/ACT inhaler Commonly known as: VENTOLIN HFA Inhale 2 puffs into the lungs every 6 (six) hours as needed for wheezing or shortness of breath.   albuterol (2.5 MG/3ML) 0.083% nebulizer solution Commonly known as: PROVENTIL Take 3 mLs (2.5 mg total) by nebulization every 6 (six) hours as needed for wheezing or shortness of breath.   amLODipine 10 MG tablet Commonly known as: NORVASC Take 1 tablet (10 mg total) by mouth daily. Start taking on: July 26, 2021 What changed:  medication strength how much to take   blood glucose meter kit and supplies Kit Dispense based on patient and insurance preference. Use up to four times daily as directed.   carvedilol 6.25 MG tablet Commonly known as: COREG Take 1 tablet (6.25 mg total) by mouth 2 (two) times daily with a meal.   ergocalciferol 1.25 MG (50000 UT) capsule Commonly known as: VITAMIN D2 Take 50,000 Units by mouth once a week.   ferrous sulfate 325 (65 FE) MG tablet Take 1 tablet (325 mg total) by mouth daily with breakfast. Start taking on: July 26, 2021   fluconazole 150 MG tablet Commonly known as: Diflucan Take 1 tablet (150 mg total) by mouth once for 1 dose.   furosemide 80 MG tablet Commonly known as: LASIX Take 1 tablet (80 mg total) by mouth 2 (two) times daily.   glipiZIDE 5 MG 24 hr tablet Commonly known as: GLUCOTROL XL Take 5 mg by mouth every evening.   GUAIFENESIN PO Take 1 tablet by mouth daily as needed (CONGESTION).   hydrocortisone 2.5 % cream Apply 1 application topically 3 (three) times daily as needed for itching.   OXYGEN Inhale 2 L into the lungs as needed (shortness of breath).   polyethylene glycol 17 g packet Commonly known as: MIRALAX / GLYCOLAX Take 17 g by mouth daily. Start taking on:  July 26, 2021   sacubitril-valsartan 24-26 MG Commonly known as: ENTRESTO Take 1 tablet by mouth 2 (two) times daily.   senna-docusate 8.6-50 MG tablet Commonly known as: Senokot-S  Take 1 tablet by mouth at bedtime.   Symbicort 80-4.5 MCG/ACT inhaler Generic drug: budesonide-formoterol Inhale 2 puffs into the lungs 2 (two) times daily.       Allergies  Allergen Reactions   Contrast Media [Iodinated Diagnostic Agents] Other (See Comments)    Coughing, sick to stomach, increase blood pressure    Empagliflozin Other (See Comments)    Yeast infection   Losartan Other (See Comments)    Back pain   Torsemide Hives   Lisinopril Nausea And Vomiting   Metformin Palpitations   Ondansetron Other (See Comments) and Swelling    Follow-up Information     Loel Dubonnet, NP Follow up on 08/08/2021.   Specialty: Cardiology Why: at 8:30AM for cardiology follow up Contact information: [place] [address] [place], [place] Follow up.   Contact information: [place] [address] [place] Follow up.   Contact information: [place] [id number]        [facility] Follow up.   Specialty: Internal Medicine Why: You may want to select this one for New PCP? Contact information: [place] Follow up.   Why: You may select this one for New PCP! Contact information: [place] [facility] [address] [phone]                 The results of significant diagnostics from this hospitalization (including imaging, microbiology, ancillary and laboratory) are listed below for reference.    Significant Diagnostic Studies: DG Chest 2 View  Result Date:  07/22/2021 CLINICAL DATA:  Shortness of breath EXAM: CHEST - 2 VIEW COMPARISON:  04/22/2021 FINDINGS: Cardiomegaly, vascular congestion. No confluent opacities, effusions or overt edema. No acute bony abnormality. IMPRESSION: Cardiomegaly, vascular congestion. Electronically Signed   By: Rolm Baptise M.D.   On: 07/22/2021 03:44   ECHOCARDIOGRAM COMPLETE  Result Date: 07/22/2021    ECHOCARDIOGRAM REPORT   Patient Name:   Kathryn Mahoney Date of Exam: 07/22/2021 Medical Rec #:  [id number]       Height:       69.0 in Accession #:    [id number]      Weight:       311.5 lb Date of Birth:  Mar 19, 1961       BSA:          2.494 m Patient Age:    74 years        BP:           170/101 mmHg Patient Gender: F               HR:           81 bpm. Exam Location:  Inpatient Procedure: 2D Echo, Cardiac Doppler and Color Doppler Indications:    R06.02 SOB  History:        Patient has prior history of Echocardiogram examinations, most                 recent 09/04/2020. CHF, COPD, Mitral Valve Disease; Risk                 Factors:Diabetes. Mitral regurgitation.  Sonographer:    Roseanna Rainbow RDCS Referring Phys:  [id number] BRADLEY S CHOTINER  Sonographer Comments: Technically difficult study due to poor echo windows and patient is morbidly obese. Image acquisition challenging due to patient body habitus. Patient talking throughout exam. IMPRESSIONS  1. Left ventricular ejection fraction, by estimation, is 50 to 55%. Left ventricular ejection fraction by 2D MOD biplane is 53.5 %. The left ventricle has low normal function. The left ventricle has no regional wall motion abnormalities. There is moderate left ventricular hypertrophy. Left ventricular diastolic parameters are consistent with Grade I diastolic dysfunction (impaired relaxation). LV filling pressure apperas normal.  2. Right ventricular systolic function is normal. The right ventricular size is normal. There is mildly elevated pulmonary artery systolic pressure. The estimated right  ventricular systolic pressure is 51.8 mmHg.  3. The mitral valve is abnormal. The leaflet tips are sclerotic. Mild to moderate mitral valve regurgitation. There is mild late systolic prolapse of of the mitral valve. (Image 95)  4. The aortic valve is tricuspid. Aortic valve regurgitation is not visualized.  5. The inferior vena cava is dilated in size with >50% respiratory variability, suggesting right atrial pressure of 8 mmHg. Comparison(s): No significant change from prior study. 09/04/2020: LVEF 50-55%, moderate MR. FINDINGS  Left Ventricle: Left ventricular ejection fraction, by estimation, is 50 to 55%. Left ventricular ejection fraction by 2D MOD biplane is 53.5 %. The left ventricle has low normal function. The left ventricle has no regional wall motion abnormalities. The left ventricular internal cavity size was normal in size. There is moderate left ventricular hypertrophy. Left ventricular diastolic parameters are consistent with Grade I diastolic dysfunction (impaired relaxation). Normal left ventricular filling pressure. Right Ventricle: The right ventricular size is normal. No increase in right ventricular wall thickness. Right ventricular systolic function is normal. There is mildly elevated pulmonary artery systolic pressure. The tricuspid regurgitant velocity is 2.68  m/s, and with an assumed right atrial pressure of 8 mmHg, the estimated right ventricular systolic pressure is 84.1 mmHg. Left Atrium: Left atrial size was normal in size. Right Atrium: Right atrial size was normal in size. Pericardium: There is no evidence of pericardial effusion. Mitral Valve: The mitral valve is abnormal. There is mild late systolic prolapse of of the mitral valve. There is mild thickening of the mitral valve leaflet(s). Mild to moderate mitral valve regurgitation. MV peak gradient, 6.7 mmHg. The mean mitral valve gradient is 3.0 mmHg. Tricuspid Valve: The tricuspid valve is grossly normal. Tricuspid valve  regurgitation is trivial. Aortic Valve: The aortic valve is tricuspid. Aortic valve regurgitation is not visualized. Pulmonic Valve: The pulmonic valve was normal in structure. Pulmonic valve regurgitation is not visualized. Aorta: The aortic root and ascending aorta are structurally normal, with no evidence of dilitation. Venous: The inferior vena cava is dilated in size with greater than 50% respiratory variability, suggesting right atrial pressure of 8 mmHg. IAS/Shunts: No atrial level shunt detected by color flow Doppler.  LEFT VENTRICLE PLAX 2D                        Biplane EF (MOD) LVIDd:         5.00 cm         LV Biplane EF:   Left LVIDs:         3.60 cm                          ventricular LV PW:  1.40 cm                          ejection LV IVS:        1.80 cm                          fraction by LVOT diam:     2.10 cm                          2D MOD LV SV:         67                               biplane is LV SV Index:   27                               53.5 %. LVOT Area:     3.46 cm                                Diastology                                LV e' medial:    6.09 cm/s LV Volumes (MOD)               LV E/e' medial:  18.9 LV vol d, MOD    172.0 ml      LV e' lateral:   5.44 cm/s A2C:                           LV E/e' lateral: 21.1 LV vol d, MOD    159.0 ml A4C: LV vol s, MOD    63.8 ml A2C: LV vol s, MOD    92.6 ml A4C: LV SV MOD A2C:   108.2 ml LV SV MOD A4C:   159.0 ml LV SV MOD BP:    90.5 ml RIGHT VENTRICLE             IVC RV S prime:     12.20 cm/s  IVC diam: 2.50 cm TAPSE (M-mode): 2.6 cm LEFT ATRIUM             Index       RIGHT ATRIUM          Index LA diam:        3.50 cm 1.40 cm/m  RA Area:     8.04 cm LA Vol (A2C):   56.3 ml 22.57 ml/m RA Volume:   10.70 ml 4.29 ml/m LA Vol (A4C):   71.9 ml 28.81 ml/m LA Biplane Vol: 59.8 ml 23.98 ml/m  AORTIC VALVE LVOT Vmax:   121.00 cm/s LVOT Vmean:  83.100 cm/s LVOT VTI:    0.192 m  AORTA Ao Root diam: 3.30 cm Ao Asc diam:  3.60  cm MITRAL VALVE                 TRICUSPID VALVE MV Area (PHT): 5.13 cm      TR Peak grad:   28.7 mmHg MV Area VTI:   2.36 cm      TR Vmax:  268.00 cm/s MV Peak grad:  6.7 mmHg MV Mean grad:  3.0 mmHg      SHUNTS MV Vmax:       1.29 m/s      Systemic VTI:  0.19 m MV Vmean:      77.6 cm/s     Systemic Diam: 2.10 cm MV Decel Time: 148 msec MR Peak grad:    143.5 mmHg MR Mean grad:    96.0 mmHg MR Vmax:         599.00 cm/s MR Vmean:        459.0 cm/s MR PISA:         1.27 cm MR PISA Eff ROA: 7 mm MR PISA Radius:  0.45 cm MV E velocity: 115.00 cm/s MV A velocity: 141.00 cm/s MV E/A ratio:  0.82 Lyman Bishop MD Electronically signed by Lyman Bishop MD Signature Date/Time: 07/22/2021/4:27:18 PM    Final     Microbiology: Recent Results (from the past 240 hour(s))  SARS CORONAVIRUS 2 (TAT 6-24 HRS) Nasopharyngeal Nasopharyngeal Swab     Status: None   Collection Time: 07/22/21  5:55 AM   Specimen: Nasopharyngeal Swab  Result Value Ref Range Status   SARS Coronavirus 2 NEGATIVE NEGATIVE Final    Comment: (NOTE) SARS-CoV-2 target nucleic acids are NOT DETECTED.  The SARS-CoV-2 RNA is generally detectable in upper and lower respiratory specimens during the acute phase of infection. Negative results do not preclude SARS-CoV-2 infection, do not rule out co-infections with other pathogens, and should not be used as the sole basis for treatment or other patient management decisions. Negative results must be combined with clinical observations, patient history, and epidemiological information. The expected result is Negative.  Fact Sheet for Patients: SugarRoll.be  Fact Sheet for Healthcare Providers: https://www.woods-mathews.com/  This test is not yet approved or cleared by the [place] FDA and  has been authorized for detection and/or diagnosis of SARS-CoV-2 by FDA under an Emergency Use Authorization (EUA). This EUA will remain  in effect  (meaning this test can be used) for the duration of the COVID-19 declaration under Se ction 564(b)(1) of the Act, 21 U.S.C. section 360bbb-3(b)(1), unless the authorization is terminated or revoked sooner.  Performed at [facility], [place] [address] [place], [place] [address]      Labs: Basic Metabolic Panel: Recent Labs  Lab 07/22/21 0206 07/23/21 0431 07/24/21 0419 07/25/21 0348  NA 140 139 143 142  K 4.0 3.8 3.8 3.7  CL 104 101 102 103  CO2 29 31 33* 31  GLUCOSE 176* 185* 169* 179*  BUN _0 CREATININE 1.02* 0.78 0.82 0.92  CALCIUM 9.2 9.1 9.8 9.4   Liver Function Tests: Recent Labs  Lab 07/22/21 0206  AST 16  ALT 22  ALKPHOS 101  BILITOT 0.4  PROT 7.6  ALBUMIN 3.3*   No results for input(s): LIPASE, AMYLASE in the last 168 hours. No results for input(s): AMMONIA in the last 168 hours. CBC: Recent Labs  Lab 07/22/21 0206 07/24/21 0419  WBC 11.8* 9.9  NEUTROABS 7.4  --   HGB 10.9* 11.6*  HCT 36.3 37.6  MCV 86.2 83.7  PLT 433* 429*   Cardiac Enzymes: No results for input(s): CKTOTAL, CKMB, CKMBINDEX, TROPONINI in the last 168 hours. BNP: BNP (last 3 results) Recent Labs    08/27/20 1854 09/03/20 1309 07/22/21 0211  BNP 88.2 49.4 42.8    ProBNP (last 3 results) No results for input(s): PROBNP in the  last 8760 hours.  CBG: Recent Labs  Lab 07/24/21 1145 07/24/21 1629 07/24/21 2049 07/25/21 0725 07/25/21 1120  GLUCAP 152* 113* 157* 173* 165*       Signed:  Florencia Reasons MD, PhD, FACP  [facility] 07/25/2021, 12:42 PM

## 2021-08-07 NOTE — Progress Notes (Deleted)
Office Visit    Patient Name: Kathryn Mahoney Date of Encounter: 08/07/2021  PCP:  [facility] [place]  Cardiologist:  Buford Dresser, MD  Advanced Practice Provider:  No care team member to display Electrophysiologist:  None      Chief Complaint    [name] is a 60 y.o. female with a hx of HFpEF, Dm2, HTN, COPD, OSA, morbid obesity presents today for hospital follow up   Past Medical History    Past Medical History:  Diagnosis Date   Asthma    B12 deficiency    CHF (congestive heart failure) (HCC)    COPD (chronic obstructive pulmonary disease) ([place])    Diabetes mellitus without complication ([place])    Hypertension    Morbid obesity ([place])    SOB (shortness of breath)    Urinary incontinence    Past Surgical History:  Procedure Laterality Date   CESAREAN SECTION     ECTOPIC PREGNANCY SURGERY     KNEE SURGERY      Allergies  Allergies  Allergen Reactions   Contrast Media [Iodinated Diagnostic Agents] Other (See Comments)    Coughing, sick to stomach, increase blood pressure    Empagliflozin Other (See Comments)    Yeast infection   Losartan Other (See Comments)    Back pain   Torsemide Hives   Lisinopril Nausea And Vomiting   Metformin Palpitations   Ondansetron Other (See Comments) and Swelling    History of Present Illness    Kathryn Mahoney is a 60 y.o. female with a hx of HFpEF, Dm2, HTN, COPD, OSA, morbid obesity last seen while hospitalized.  She was admitted 07/22/21-07/25/21 due to acute on chronic diastolic heart failure and hypertensive emergency. She was treated with Entresto, Coreg, Lasix. She declined Torsemide due to perception it would cause hives, declined SGLT2i due to GU infection, declined Spironolactone to low back pain while taking it previously.   She presents today for follow up. ***  EKGs/Labs/Other Studies Reviewed:   The following studies were reviewed today:  Echo  07/22/21  1. Left ventricular ejection fraction, by estimation, is 50 to 55%. Left  ventricular ejection fraction by 2D MOD biplane is 53.5 %. The left  ventricle has low normal function. The left ventricle has no regional wall  motion abnormalities. There is  moderate left ventricular hypertrophy. Left ventricular diastolic  parameters are consistent with Grade I diastolic dysfunction (impaired  relaxation). LV filling pressure apperas normal.   2. Right ventricular systolic function is normal. The right ventricular  size is normal. There is mildly elevated pulmonary artery systolic  pressure. The estimated right ventricular systolic pressure is 62.5 mmHg.   3. The mitral valve is abnormal. The leaflet tips are sclerotic. Mild to  moderate mitral valve regurgitation. There is mild late systolic prolapse  of of the mitral valve. (Image 95)   4. The aortic valve is tricuspid. Aortic valve regurgitation is not  visualized.   5. The inferior vena cava is dilated in size with >50% respiratory  variability, suggesting right atrial pressure of 8 mmHg.   Comparison(s): No significant change from prior study. 09/04/2020: LVEF  50-55%, moderate MR.   EKG:  No EKG today.  Recent Labs: 09/05/2020: Magnesium 2.2 09/06/2020: TSH 2.583 07/22/2021: ALT 22; B Natriuretic Peptide 42.8 07/24/2021: Hemoglobin 11.6; Platelets 429 07/25/2021: BUN 19; Creatinine, Ser 0.92; Potassium 3.7; Sodium 142  Recent Lipid Panel No results found for: CHOL, TRIG, HDL, CHOLHDL, VLDL,  [place], [place] Medications   No outpatient medications have been marked as taking for the 08/08/21 encounter (Appointment) with Loel Dubonnet, NP.     Review of Systems      All other systems reviewed and are otherwise negative except as noted above.  Physical Exam    VS:  There were no vitals taken for this visit. , BMI There is no height or weight on file to calculate BMI.  Wt Readings from Last 3 Encounters:   07/25/21 (!) 306 lb 7 oz (139 kg)  09/17/20 (!) 311 lb (141.1 kg)  09/06/20 (!) 319 lb 9.6 oz (145 kg)     GEN: Well nourished, well developed, in no acute distress. HEENT: normal. Neck: Supple, no JVD, carotid bruits, or masses. Cardiac: ***RRR, no murmurs, rubs, or gallops. No clubbing, cyanosis, edema.  ***Radials/PT 2+ and equal bilaterally.  Respiratory:  ***Respirations regular and unlabored, clear to auscultation bilaterally. GI: Soft, nontender, nondistended. MS: No deformity or atrophy. Skin: Warm and dry, no rash. Neuro:  Strength and sensation are intact. Psych: Normal affect.  Assessment & Plan    HFpEF -   OSA - CPAP compliance encouraged.   HTN -   Pulmonary hypertension - Previously declined RHC.   Moderate MR -   Mild pulmonary hypertension -   COPD - Quit smoking 2010. ***  DM2 - Not taking Glipizide consistently prior to recent admission.   Disposition: Follow up {follow up:15908} with Dr. Harrell Gave or APP.  Signed, Loel Dubonnet, NP 08/07/2021, 8:51 PM [place]

## 2021-08-08 ENCOUNTER — Ambulatory Visit (HOSPITAL_BASED_OUTPATIENT_CLINIC_OR_DEPARTMENT_OTHER): Payer: Medicaid Other | Admitting: Family

## 2021-08-24 ENCOUNTER — Encounter (HOSPITAL_COMMUNITY): Payer: Self-pay

## 2021-08-24 ENCOUNTER — Other Ambulatory Visit: Payer: Self-pay

## 2021-08-24 ENCOUNTER — Ambulatory Visit (HOSPITAL_COMMUNITY)
Admission: EM | Admit: 2021-08-24 | Discharge: 2021-08-24 | Disposition: A | Discharge status: Home / Self Care | Payer: Medicaid Other | Attending: Emergency Medicine | Admitting: Emergency Medicine

## 2021-08-24 DIAGNOSIS — M545 Low back pain, unspecified: Secondary | ICD-10-CM

## 2021-08-24 DIAGNOSIS — Z760 Encounter for issue of repeat prescription: Secondary | ICD-10-CM | POA: Diagnosis not present

## 2021-08-24 MED ORDER — BACLOFEN 10 MG PO TABS: 10.0000 mg | ORAL_TABLET | Freq: Every day | ORAL | 0 refills | Status: AC

## 2021-08-24 MED ORDER — KETOROLAC TROMETHAMINE 60 MG/2ML IM SOLN
60.0000 mg | Freq: Once | INTRAMUSCULAR | Status: AC
Start: 2021-08-24 — End: 2021-08-24
  Administered 2021-08-24: 60 mg via INTRAMUSCULAR

## 2021-08-24 MED ORDER — AMLODIPINE BESYLATE 10 MG PO TABS: 10.0000 mg | ORAL_TABLET | Freq: Every day | ORAL | 0 refills | Status: DC

## 2021-08-24 MED ORDER — KETOROLAC TROMETHAMINE 60 MG/2ML IM SOLN
INTRAMUSCULAR | Status: AC
  Filled 2021-08-24: qty 2

## 2021-08-24 NOTE — Discharge Instructions (Signed)
You are provided with an injection of ketorolac today to address the majority of your pain.  I also provided you with a muscle relaxer that you can take at bedtime.  Please continue ibuprofen 400 mg 3 times daily as pain begins to return.  At your request, I have refilled your amlodipine.
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
## 2021-09-06 ENCOUNTER — Other Ambulatory Visit: Payer: Self-pay

## 2021-09-06 ENCOUNTER — Ambulatory Visit (HOSPITAL_COMMUNITY)
Admission: EM | Admit: 2021-09-06 | Discharge: 2021-09-06 | Disposition: A | Discharge status: Home / Self Care | Payer: Medicaid Other | Attending: Family Medicine | Admitting: Family Medicine

## 2021-09-06 ENCOUNTER — Encounter (HOSPITAL_COMMUNITY): Payer: Self-pay

## 2021-09-06 DIAGNOSIS — G8929 Other chronic pain: Secondary | ICD-10-CM | POA: Diagnosis not present

## 2021-09-06 DIAGNOSIS — M542 Cervicalgia: Secondary | ICD-10-CM | POA: Diagnosis not present

## 2021-09-06 DIAGNOSIS — R519 Headache, unspecified: Secondary | ICD-10-CM | POA: Diagnosis not present

## 2021-09-06 DIAGNOSIS — M549 Dorsalgia, unspecified: Secondary | ICD-10-CM

## 2021-09-06 MED ORDER — KETOROLAC TROMETHAMINE 60 MG/2ML IM SOLN
60.0000 mg | Freq: Once | INTRAMUSCULAR | Status: AC
  Administered 2021-09-06: 60 mg via INTRAMUSCULAR

## 2021-09-06 MED ORDER — KETOROLAC TROMETHAMINE 60 MG/2ML IM SOLN
INTRAMUSCULAR | Status: AC
  Filled 2021-09-06: qty 2

## 2021-09-06 NOTE — Discharge Instructions (Addendum)
Purchase over the counter lidocaine patches for management of chronic neck and back pain. Take tylenol or naprosyn as needed for pain Continue previously prescribed baclofen  Reschedule appointment with your cardiologist Follow-up with [facility] medical provider to discuss a referral to their pain management clinic for ongoing management of neck and back pain.

## 2021-09-06 NOTE — ED Triage Notes (Signed)
Pt presents with neck, back pain and headache x 2 weeks. Pt states she is back for the injection that helped her with the pain.

## 2021-09-06 NOTE — ED Provider Notes (Signed)
[place]    CSN: [id number] Arrival date & time: 09/06/21  6553      History   Chief Complaint Chief Complaint  Patient presents with   Back Pain   Neck Pain   Headache    HPI Kathryn Mahoney is a 60 y.o. female.   HPI Patient with a complex medical history consisting of CHF, COPD and type 2 diabetes morbid obesity and hypertension presents today with recurrent neck, back, and headache pain.  Patient seen here on 08/24/21 and received a Toradol injection and reports this alleviated her pain.  She is requesting another injection today as she subsequently reports that she tripped and fell several days ago and has had pain ever since.  She is followed by [facility] medical for primary care reports she does not have a orthopedic or neurosurgeon or pain management for her recurrent chronic back pain.  She is fully ambulatory endorses pain with movement Past Medical History:  Diagnosis Date   Asthma    B12 deficiency    CHF (congestive heart failure) (HCC)    COPD (chronic obstructive pulmonary disease) ([place])    Diabetes mellitus without complication ([place])    Hypertension    Morbid obesity ([place])    SOB (shortness of breath)    Urinary incontinence     Patient Active Problem List   Diagnosis Date Noted   Acute on chronic heart failure with preserved ejection fraction (HFpEF) ([place]) 07/22/2021   Diabetes mellitus type 2, uncontrolled 07/22/2021   Orthopnea 07/22/2021   Obesity, Class III, BMI 40-49.9 (morbid obesity) ([place]) 07/22/2021   Chronic diastolic heart failure ([place]) 01/06/2021   Nonrheumatic mitral valve regurgitation 01/06/2021   Diabetes mellitus type 2 in obese ([place]) 01/06/2021   CHF exacerbation ([place]) 09/03/2020   Type II or unspecified type diabetes mellitus without mention of complication, not stated as uncontrolled 11/07/2013   Hyperglycemia 11/06/2013   COPD exacerbation ([place]) 11/02/2013   PNA (pneumonia) 11/02/2013   Hypoxia 11/02/2013    Acute-on-chronic respiratory failure ([place]) 11/02/2013   COPD (chronic obstructive pulmonary disease) (HCC)    CHF (congestive heart failure) (HCC)    Urinary incontinence    Hypertension     Past Surgical History:  Procedure Laterality Date   CESAREAN SECTION     ECTOPIC PREGNANCY SURGERY     KNEE SURGERY      OB History     Gravida  5   Para  4   Term  4   Preterm      AB  1   Living  4      SAB      IAB      Ectopic  1   Multiple      Live Births               Home Medications    Prior to Admission medications   Medication Sig Start Date End Date Taking? Authorizing Provider  acetaminophen (TYLENOL) 650 MG CR tablet Take 650 mg by mouth every 8 (eight) hours as needed for pain.    [provider]  albuterol (PROVENTIL) (2.5 MG/3ML) 0.083% nebulizer solution Take 3 mLs (2.5 mg total) by nebulization every 6 (six) hours as needed for wheezing or shortness of breath. 03/21/17   Robinson, [place] N, PA-C  albuterol (VENTOLIN HFA) 108 (90 Base) MCG/ACT inhaler Inhale 2 puffs into the lungs every 6 (six) hours as needed for wheezing or shortness of breath. 11/05/13   [provider]  amLODipine (NORVASC) 10 MG tablet Take 1 tablet (10 mg total) by mouth daily. 08/24/21   Lynden Oxford Scales, PA-C  blood glucose meter kit and supplies KIT Dispense based on patient and insurance preference. Use up to four times daily as directed. 07/25/21   Florencia Reasons, MD  carvedilol (COREG) 6.25 MG tablet Take 1 tablet (6.25 mg total) by mouth 2 (two) times daily with a meal. 07/25/21   Florencia Reasons, MD  ergocalciferol (VITAMIN D2) 1.25 MG (50000 UT) capsule Take 50,000 Units by mouth once a week.    [provider]  ferrous sulfate 325 (65 FE) MG tablet Take 1 tablet (325 mg total) by mouth daily with breakfast. 07/26/21   Florencia Reasons, MD  furosemide (LASIX) 80 MG tablet Take 1 tablet (80 mg total) by mouth 2 (two) times daily. 07/25/21   Florencia Reasons, MD  glipiZIDE  (GLUCOTROL XL) 5 MG 24 hr tablet Take 5 mg by mouth every evening.  08/24/20   [provider]  GUAIFENESIN PO Take 1 tablet by mouth daily as needed (CONGESTION).    [provider]  hydrocortisone 2.5 % cream Apply 1 application topically 3 (three) times daily as needed for itching.    [provider]  OXYGEN Inhale 2 L into the lungs as needed (shortness of breath).    [provider]  polyethylene glycol (MIRALAX / GLYCOLAX) 17 g packet Take 17 g by mouth daily. 07/26/21   Florencia Reasons, MD  sacubitril-valsartan (ENTRESTO) 24-26 MG Take 1 tablet by mouth 2 (two) times daily. 07/25/21   Florencia Reasons, MD  senna-docusate (SENOKOT-S) 8.6-50 MG tablet Take 1 tablet by mouth at bedtime. 07/25/21   Florencia Reasons, MD  SYMBICORT 80-4.5 MCG/ACT inhaler Inhale 2 puffs into the lungs 2 (two) times daily. 11/07/17   [provider]    Family History Family History  Problem Relation Age of Onset   Stroke Mother    Asthma Mother     Social History Social History   Tobacco Use   Smoking status: Former    Packs/day: 1.50    Years: 30.00    Pack years: 45.00    Types: Cigarettes    Quit date: 10/27/2010    Years since quitting: 10.8   Smokeless tobacco: Never  Substance Use Topics   Alcohol use: No   Drug use: No     Allergies   Contrast media [iodinated diagnostic agents], Empagliflozin, Losartan, Torsemide, Lisinopril, Metformin, and Ondansetron   Review of Systems Review of Systems Pertinent negatives listed in HPI  Physical Exam Triage Vital Signs ED Triage Vitals  Enc Vitals Group     BP 09/06/21 0859 129/82     Pulse Rate 09/06/21 0859 93     Resp 09/06/21 0859 19     Temp 09/06/21 0859 98.5 F (36.9 C)     Temp Source 09/06/21 0859 Oral     SpO2 09/06/21 0859 97 %     Weight --      Height --      Head Circumference --      Peak Flow --      Pain Score 09/06/21 0858 10     Pain Loc --      Pain Edu? --      Excl. in [place]? --    No data  found.  Updated Vital Signs BP 129/82 (BP Location: Right Arm)   Pulse 93   Temp 98.5 F (36.9 C) (Oral)   Resp  19   SpO2 97%   Visual Acuity Right Eye Distance:   Left Eye Distance:   Bilateral Distance:    Right Eye Near:   Left Eye Near:    Bilateral Near:     Physical Exam Constitutional:      Appearance: She is obese. She is not ill-appearing.  HENT:     Head: Normocephalic and atraumatic.  Eyes:     Extraocular Movements: Extraocular movements intact.  Neck:     Comments: Palpable tenderness elicited on exam.  Preserved ROM Cardiovascular:     Rate and Rhythm: Normal rate and regular rhythm.  Pulmonary:     Effort: Pulmonary effort is normal.  Musculoskeletal:        General: Tenderness present.     Cervical back: Normal range of motion. No rigidity.  Lymphadenopathy:     Cervical: No cervical adenopathy.  Neurological:     Mental Status: She is alert and oriented to person, place, and time.     GCS: GCS eye subscore is 4. GCS verbal subscore is 5. GCS motor subscore is 6.  Psychiatric:        Mood and Affect: Mood normal.        Speech: Speech normal.        Behavior: Behavior normal.     UC Treatments / Results  Labs (all labs ordered are listed, but only abnormal results are displayed) Labs Reviewed - No data to display  EKG   Radiology No results found.  Procedures Procedures (including critical care time)  Medications Ordered in UC Medications  ketorolac (TORADOL) injection 60 mg (60 mg Intramuscular Given 09/06/21 1027)    Initial Impression / Assessment and Plan / UC Course  I have reviewed the triage vital signs and the nursing notes.  Pertinent labs & imaging results that were available during my care of the patient were reviewed by me and considered in my medical decision making (see chart for details).     Chronic recurrent neck and back pain and recurrent headaches patient was seen for same issue on 08/24/2021 and reports  relief with Toradol.  Counseled patient extensively regarding recurrent use of Toradol and the impact it can have on kidneys therefore appropriate treatment would be to discuss with primary care provider referral to pain management or to a specialist for ongoing management of a chronic problem.  Agree to administer Toradol shot today.  I also provided education on some over-the-counter measures to help alleviate chronic pain.  Patient verbalized understanding and agreement with plan.   Final Clinical Impressions(s) / UC Diagnoses   Final diagnoses:  Chronic neck and back pain  Recurrent headache     Discharge Instructions      Purchase over the counter lidocaine patches for management of chronic neck and back pain. Take tylenol or naprosyn as needed for pain Continue previously prescribed baclofen  Reschedule appointment with your cardiologist Follow-up with [facility] medical provider to discuss a referral to their pain management clinic for ongoing management of neck and back pain.     ED Prescriptions   None    PDMP not reviewed this encounter.   Scot Jun, FNP 09/06/21 1037

## 2021-10-23 ENCOUNTER — Emergency Department (HOSPITAL_COMMUNITY): Payer: Medicaid Other

## 2021-10-23 ENCOUNTER — Observation Stay (HOSPITAL_COMMUNITY)
Admission: EM | Admit: 2021-10-23 | Discharge: 2021-10-24 | Disposition: A | Discharge status: Home / Self Care | Payer: Medicaid Other | Attending: Internal Medicine | Admitting: Internal Medicine

## 2021-10-23 ENCOUNTER — Encounter (HOSPITAL_COMMUNITY): Payer: Self-pay | Admitting: Emergency Medicine

## 2021-10-23 ENCOUNTER — Other Ambulatory Visit: Payer: Self-pay

## 2021-10-23 DIAGNOSIS — I110 Hypertensive heart disease with heart failure: Secondary | ICD-10-CM | POA: Diagnosis not present

## 2021-10-23 DIAGNOSIS — E1169 Type 2 diabetes mellitus with other specified complication: Secondary | ICD-10-CM | POA: Diagnosis present

## 2021-10-23 DIAGNOSIS — Z87891 Personal history of nicotine dependence: Secondary | ICD-10-CM | POA: Insufficient documentation

## 2021-10-23 DIAGNOSIS — Z20822 Contact with and (suspected) exposure to covid-19: Secondary | ICD-10-CM | POA: Diagnosis not present

## 2021-10-23 DIAGNOSIS — I5043 Acute on chronic combined systolic (congestive) and diastolic (congestive) heart failure: Secondary | ICD-10-CM | POA: Insufficient documentation

## 2021-10-23 DIAGNOSIS — J45909 Unspecified asthma, uncomplicated: Secondary | ICD-10-CM | POA: Insufficient documentation

## 2021-10-23 DIAGNOSIS — Z79899 Other long term (current) drug therapy: Secondary | ICD-10-CM | POA: Diagnosis not present

## 2021-10-23 DIAGNOSIS — I5033 Acute on chronic diastolic (congestive) heart failure: Secondary | ICD-10-CM | POA: Diagnosis present

## 2021-10-23 DIAGNOSIS — Z7984 Long term (current) use of oral hypoglycemic drugs: Secondary | ICD-10-CM | POA: Diagnosis not present

## 2021-10-23 DIAGNOSIS — R0602 Shortness of breath: Secondary | ICD-10-CM | POA: Diagnosis present

## 2021-10-23 DIAGNOSIS — E669 Obesity, unspecified: Secondary | ICD-10-CM | POA: Diagnosis not present

## 2021-10-23 DIAGNOSIS — J441 Chronic obstructive pulmonary disease with (acute) exacerbation: Secondary | ICD-10-CM | POA: Diagnosis not present

## 2021-10-23 DIAGNOSIS — E119 Type 2 diabetes mellitus without complications: Secondary | ICD-10-CM | POA: Insufficient documentation

## 2021-10-23 DIAGNOSIS — Z7951 Long term (current) use of inhaled steroids: Secondary | ICD-10-CM | POA: Insufficient documentation

## 2021-10-23 LAB — BASIC METABOLIC PANEL
Anion gap: 9 (ref 5–15)
BUN: 15 mg/dL (ref 8–23)
CO2: 36 mmol/L — ABNORMAL HIGH (ref 22–32)
Calcium: 9.3 mg/dL (ref 8.9–10.3)
Chloride: 96 mmol/L — ABNORMAL LOW (ref 98–111)
Creatinine, Ser: 0.87 mg/dL (ref 0.44–1.00)
GFR, Estimated: >60 mL/min (ref >60)
Glucose, Bld: 145 mg/dL — ABNORMAL HIGH (ref 70–99)
Potassium: 3.7 mmol/L (ref 3.5–5.1)
Sodium: 141 mmol/L (ref 135–145)

## 2021-10-23 LAB — CBC WITH DIFFERENTIAL/PLATELET
Abs Immature Granulocytes: 0.07 10*3/uL (ref 0.00–0.07)
Basophils Absolute: 0 10*3/uL (ref 0.0–0.1)
Basophils Relative: 0 %
Eosinophils Absolute: 0.2 10*3/uL (ref 0.0–0.5)
Eosinophils Relative: 2 %
HCT: 40.8 % (ref 36.0–46.0)
Hemoglobin: 12 g/dL (ref 12.0–15.0)
Immature Granulocytes: 1 %
Lymphocytes Relative: 35 %
Lymphs Abs: 4.2 10*3/uL — ABNORMAL HIGH (ref 0.7–4.0)
MCH: 25.5 pg — ABNORMAL LOW (ref 26.0–34.0)
MCHC: 29.4 g/dL — ABNORMAL LOW (ref 30.0–36.0)
MCV: 86.8 fL (ref 80.0–100.0)
Monocytes Absolute: 0.7 10*3/uL (ref 0.1–1.0)
Monocytes Relative: 6 %
Neutro Abs: 6.8 10*3/uL (ref 1.7–7.7)
Neutrophils Relative %: 56 %
Platelets: 460 10*3/uL — ABNORMAL HIGH (ref 150–400)
RBC: 4.7 MIL/uL (ref 3.87–5.11)
RDW: 16.8 % — ABNORMAL HIGH (ref 11.5–15.5)
WBC: 12 10*3/uL — ABNORMAL HIGH (ref 4.0–10.5)
nRBC: 0 % (ref 0.0–0.2)

## 2021-10-23 LAB — HEMOGLOBIN A1C
Hgb A1c MFr Bld: 8 % — ABNORMAL HIGH (ref 4.8–5.6)
Mean Plasma Glucose: 182.9 mg/dL

## 2021-10-23 LAB — GLUCOSE, CAPILLARY
Glucose-Capillary: 335 mg/dL — ABNORMAL HIGH (ref 70–99)
Glucose-Capillary: 218 mg/dL — ABNORMAL HIGH (ref 70–99)

## 2021-10-23 LAB — CBG MONITORING, ED: Glucose-Capillary: 310 mg/dL — ABNORMAL HIGH (ref 70–99)

## 2021-10-23 LAB — RESP PANEL BY RT-PCR (FLU A&B, COVID) ARPGX2
Influenza A by PCR: NEGATIVE
Influenza B by PCR: NEGATIVE
SARS Coronavirus 2 by RT PCR: NEGATIVE

## 2021-10-23 LAB — TROPONIN I (HIGH SENSITIVITY)
Troponin I (High Sensitivity): 7 ng/L (ref <18)
Troponin I (High Sensitivity): 8 ng/L (ref <18)

## 2021-10-23 LAB — BRAIN NATRIURETIC PEPTIDE: B Natriuretic Peptide: 29.8 pg/mL (ref 0.0–100.0)

## 2021-10-23 MED ORDER — IPRATROPIUM-ALBUTEROL 0.5-2.5 (3) MG/3ML IN SOLN
3.0000 mL | Freq: Once | RESPIRATORY_TRACT | Status: AC
  Administered 2021-10-23: 06:00:00 3 mL via RESPIRATORY_TRACT
  Filled 2021-10-23: qty 3

## 2021-10-23 MED ORDER — PREDNISONE 20 MG PO TABS: 40.0000 mg | ORAL_TABLET | Freq: Every day | ORAL | Status: DC

## 2021-10-23 MED ORDER — IPRATROPIUM-ALBUTEROL 0.5-2.5 (3) MG/3ML IN SOLN
3.0000 mL | Freq: Four times a day (QID) | RESPIRATORY_TRACT | Status: DC
  Administered 2021-10-23 – 2021-10-24 (×3): 3 mL via RESPIRATORY_TRACT
  Filled 2021-10-23 (×3): qty 3

## 2021-10-23 MED ORDER — ALBUTEROL SULFATE (2.5 MG/3ML) 0.083% IN NEBU
INHALATION_SOLUTION | RESPIRATORY_TRACT | Status: AC
  Administered 2021-10-23: 07:00:00 10 mg via RESPIRATORY_TRACT
  Filled 2021-10-23: qty 12

## 2021-10-23 MED ORDER — ACETAMINOPHEN 325 MG PO TABS
650.0000 mg | ORAL_TABLET | Freq: Four times a day (QID) | ORAL | Status: DC | PRN
  Administered 2021-10-23 – 2021-10-24 (×2): 650 mg via ORAL
  Filled 2021-10-23 (×2): qty 2

## 2021-10-23 MED ORDER — METHYLPREDNISOLONE SODIUM SUCC 125 MG IJ SOLR
60.0000 mg | Freq: Two times a day (BID) | INTRAMUSCULAR | Status: AC
  Administered 2021-10-23 – 2021-10-24 (×2): 60 mg via INTRAVENOUS
  Filled 2021-10-23 (×2): qty 2

## 2021-10-23 MED ORDER — ALBUTEROL (5 MG/ML) CONTINUOUS INHALATION SOLN: 10.0000 mg/h | INHALATION_SOLUTION | RESPIRATORY_TRACT | Status: DC

## 2021-10-23 MED ORDER — IPRATROPIUM-ALBUTEROL 0.5-2.5 (3) MG/3ML IN SOLN
3.0000 mL | Freq: Once | RESPIRATORY_TRACT | Status: AC
  Administered 2021-10-23: 05:00:00 3 mL via RESPIRATORY_TRACT
  Filled 2021-10-23: qty 3

## 2021-10-23 MED ORDER — GUAIFENESIN ER 600 MG PO TB12
600.0000 mg | ORAL_TABLET | Freq: Two times a day (BID) | ORAL | Status: DC
  Administered 2021-10-23 – 2021-10-24 (×2): 600 mg via ORAL
  Filled 2021-10-23 (×2): qty 1

## 2021-10-23 MED ORDER — ENOXAPARIN SODIUM 60 MG/0.6ML IJ SOSY
60.0000 mg | PREFILLED_SYRINGE | INTRAMUSCULAR | Status: DC
  Administered 2021-10-23: 18:00:00 60 mg via SUBCUTANEOUS
  Filled 2021-10-23: qty 0.6

## 2021-10-23 MED ORDER — ALBUTEROL SULFATE (2.5 MG/3ML) 0.083% IN NEBU: 10.0000 mg | INHALATION_SOLUTION | Freq: Once | RESPIRATORY_TRACT | Status: AC

## 2021-10-23 MED ORDER — INSULIN ASPART 100 UNIT/ML IJ SOLN
0.0000 [IU] | Freq: Every day | INTRAMUSCULAR | Status: DC
  Administered 2021-10-23: 23:00:00 2 [IU] via SUBCUTANEOUS
  Filled 2021-10-23: qty 0.05

## 2021-10-23 MED ORDER — SODIUM CHLORIDE 0.9 % IV BOLUS
500.0000 mL | Freq: Once | INTRAVENOUS | Status: AC
  Administered 2021-10-23: 07:00:00 500 mL via INTRAVENOUS

## 2021-10-23 MED ORDER — METHYLPREDNISOLONE SODIUM SUCC 125 MG IJ SOLR
125.0000 mg | Freq: Once | INTRAMUSCULAR | Status: AC
  Administered 2021-10-23: 05:00:00 125 mg via INTRAVENOUS
  Filled 2021-10-23: qty 2

## 2021-10-23 MED ORDER — INSULIN ASPART 100 UNIT/ML IJ SOLN
0.0000 [IU] | Freq: Three times a day (TID) | INTRAMUSCULAR | Status: DC
  Administered 2021-10-23 (×2): 11 [IU] via SUBCUTANEOUS
  Administered 2021-10-24: 08:00:00 5 [IU] via SUBCUTANEOUS
  Filled 2021-10-23: qty 0.15

## 2021-10-23 MED ORDER — ACETAMINOPHEN 650 MG RE SUPP: 650.0000 mg | Freq: Four times a day (QID) | RECTAL | Status: DC | PRN

## 2021-10-23 NOTE — ED Notes (Signed)
Pt resting comfortable in bed at this time. All comfort and safety measures in place in room with bed in lowest position.   ?

## 2021-10-23 NOTE — H&P (Signed)
History and Physical    Kathryn Mahoney QQP:[id number] DOB: 1961/08/17 DOA: 10/23/2021  PCP: [facility] [place]  Patient coming from: Home  Chief Complaint: shortness of breath  HPI: Kathryn Mahoney is a 60 y.o. female with medical history significant of COPD/asthma on chronic 2L [place], DM2, HTN, morbid obesity, HFpEF. Presenting with shortness of breath. Symptoms started over a week ago. She had cough and dyspnea. She tried her regular inhalers and some OTC meds, but it didn't help. She saw her PCP who diagnosed her with a respiratory illness and prescribed an antibiotic that she can't name. She says she completed the regimen, but it did not help. She started having increased wheeze. Her symptoms worsened through last night, when she decided to come to the ED for help. She denies any other aggravating or alleviating factors.   ED Course: CXR was negative. She was noted to be hypoxic on 2L [place]. She was given steroid and nebs with minor improvement. TRH was called for admission.   Review of Systems:  Review of systems is otherwise negative for all not mentioned in HPI.   PMHx Past Medical History:  Diagnosis Date   Asthma    B12 deficiency    CHF (congestive heart failure) (HCC)    COPD (chronic obstructive pulmonary disease) (HCC)    Diabetes mellitus without complication (HCC)    Hypertension    Morbid obesity (HCC)    SOB (shortness of breath)    Urinary incontinence     PSHx Past Surgical History:  Procedure Laterality Date   CESAREAN SECTION     ECTOPIC PREGNANCY SURGERY     KNEE SURGERY      SocHx  reports that she quit smoking about 10 years ago. Her smoking use included cigarettes. She has a 45.00 pack-year smoking history. She has never used smokeless tobacco. She reports that she does not drink alcohol and does not use drugs.  Allergies  Allergen Reactions   Contrast Media [Iodinated Contrast Media] Other (See Comments)    Coughing, sick to stomach, increase  blood pressure    Empagliflozin Other (See Comments)    Yeast infection   Losartan Other (See Comments)    Back pain   Torsemide Hives   Lisinopril Nausea And Vomiting   Metformin Palpitations   Ondansetron Other (See Comments) and Swelling    FamHx Family History  Problem Relation Age of Onset   Stroke Mother    Asthma Mother     Prior to Admission medications   Medication Sig Start Date End Date Taking? Authorizing Provider  acetaminophen (TYLENOL) 650 MG CR tablet Take 650 mg by mouth every 8 (eight) hours as needed for pain.    [provider]  albuterol (PROVENTIL) (2.5 MG/3ML) 0.083% nebulizer solution Take 3 mLs (2.5 mg total) by nebulization every 6 (six) hours as needed for wheezing or shortness of breath. 03/21/17   Robinson, [place] N, PA-C  albuterol (VENTOLIN HFA) 108 (90 Base) MCG/ACT inhaler Inhale 2 puffs into the lungs every 6 (six) hours as needed for wheezing or shortness of breath. 11/05/13   [provider]  amLODipine (NORVASC) 10 MG tablet Take 1 tablet (10 mg total) by mouth daily. 08/24/21   Lynden Oxford Scales, PA-C  blood glucose meter kit and supplies KIT Dispense based on patient and insurance preference. Use up to four times daily as directed. 07/25/21   Florencia Reasons, MD  carvedilol (COREG) 6.25 MG tablet Take 1 tablet (6.25 mg total) by mouth 2 (  two) times daily with a meal. 07/25/21   Florencia Reasons, MD  ergocalciferol (VITAMIN D2) 1.25 MG (50000 UT) capsule Take 50,000 Units by mouth once a week.    [provider]  ferrous sulfate 325 (65 FE) MG tablet Take 1 tablet (325 mg total) by mouth daily with breakfast. 07/26/21   Florencia Reasons, MD  furosemide (LASIX) 80 MG tablet Take 1 tablet (80 mg total) by mouth 2 (two) times daily. 07/25/21   Florencia Reasons, MD  glipiZIDE (GLUCOTROL XL) 5 MG 24 hr tablet Take 5 mg by mouth every evening.  08/24/20   [provider]  GUAIFENESIN PO Take 1 tablet by mouth daily as needed (CONGESTION).    [provider]  hydrocortisone 2.5 % cream Apply 1 application topically 3 (three) times daily as needed for itching.    [provider]  OXYGEN Inhale 2 L into the lungs as needed (shortness of breath).    [provider]  polyethylene glycol (MIRALAX / GLYCOLAX) 17 g packet Take 17 g by mouth daily. 07/26/21   Florencia Reasons, MD  sacubitril-valsartan (ENTRESTO) 24-26 MG Take 1 tablet by mouth 2 (two) times daily. 07/25/21   Florencia Reasons, MD  senna-docusate (SENOKOT-S) 8.6-50 MG tablet Take 1 tablet by mouth at bedtime. 07/25/21   Florencia Reasons, MD  SYMBICORT 80-4.5 MCG/ACT inhaler Inhale 2 puffs into the lungs 2 (two) times daily. 11/07/17   [provider]    Physical Exam: Vitals:   10/23/21 0651 10/23/21 0700 10/23/21 0758 10/23/21 0830  BP:  (!) 152/89 (!) 154/84 122/70  Pulse:  99 (!) 115 (!) 110  Resp:  '15 20 19  ' Temp:      TempSrc:      SpO2: 99% 98%  90%    General: 60 y.o. female resting in bed in NAD Eyes: PERRL, normal sclera ENMT: Nares patent w/o discharge, orophaynx clear, dentition normal, ears w/o discharge/lesions/ulcers Neck: Supple, trachea midline Cardiovascular: RRR, +S1, S2, no m/g/r, equal pulses throughout Respiratory: diffuse exp wheeze, no r/r, breathless w/ speech GI: BS+, NDNT, no masses noted, no organomegaly noted MSK: No e/c/c Neuro: A&O x 3, no focal deficits Psyc: Appropriate interaction and affect, calm/cooperative  Labs on Admission: I have personally reviewed following labs and imaging studies  CBC: Recent Labs  Lab 10/23/21 0511  WBC 12.0*  NEUTROABS 6.8  HGB 12.0  HCT 40.8  MCV 86.8  PLT 037*   Basic Metabolic Panel: Recent Labs  Lab 10/23/21 0511  NA 141  K 3.7  CL 96*  CO2 36*  GLUCOSE 145*  BUN 15  CREATININE 0.87  CALCIUM 9.3   GFR: CrCl cannot be calculated (Unknown ideal weight.). Liver Function Tests: No results for input(s): AST, ALT, ALKPHOS, BILITOT, PROT, ALBUMIN in the last 168 hours. No results  for input(s): LIPASE, AMYLASE in the last 168 hours. No results for input(s): AMMONIA in the last 168 hours. Coagulation Profile: No results for input(s): INR, PROTIME in the last 168 hours. Cardiac Enzymes: No results for input(s): CKTOTAL, CKMB, CKMBINDEX, TROPONINI in the last 168 hours. BNP (last 3 results) No results for input(s): PROBNP in the last 8760 hours. HbA1C: No results for input(s): HGBA1C in the last 72 hours. CBG: No results for input(s): GLUCAP in the last 168 hours. Lipid Profile: No results for input(s): CHOL, HDL, LDLCALC, TRIG, CHOLHDL, LDLDIRECT in the last 72 hours. Thyroid Function Tests: No results for input(s): TSH, T4TOTAL, FREET4, T3FREE, THYROIDAB in the last  72 hours. Anemia Panel: No results for input(s): VITAMINB12, FOLATE, FERRITIN, TIBC, IRON, RETICCTPCT in the last 72 hours. Urine analysis:    Component Value Date/Time   COLORURINE YELLOW (A) 07/22/2021 0211   APPEARANCEUR CLEAR (A) 07/22/2021 0211   LABSPEC <=1.005 07/22/2021 0211   PHURINE 6.0 07/22/2021 0211   GLUCOSEU NEGATIVE 07/22/2021 0211   HGBUR TRACE (A) 07/22/2021 0211   BILIRUBINUR NEGATIVE 07/22/2021 0211   KETONESUR NEGATIVE 07/22/2021 0211   PROTEINUR NEGATIVE 07/22/2021 0211   UROBILINOGEN 0.2 01/16/2020 1152   NITRITE NEGATIVE 07/22/2021 0211   LEUKOCYTESUR NEGATIVE 07/22/2021 0211    Radiological Exams on Admission: DG Chest 2 View  Result Date: 10/23/2021 CLINICAL DATA:  Shortness of breath EXAM: CHEST - 2 VIEW COMPARISON:  07/22/2021 FINDINGS: The heart size and mediastinal contours are within normal limits. Both lungs are clear. The visualized skeletal structures are unremarkable. IMPRESSION: No active cardiopulmonary disease. Electronically Signed   By: Ulyses Jarred M.D.   On: 10/23/2021 01:51    EKG: Independently reviewed. Sinus tach, no st elevation  Assessment/Plan COPD exacerbation     - place in obs, tele     - nebs, steroids     - guaifenesin  PRN  Chronic HFpEF     - resume home regimen     - not in exacerbation     - BNP wnl, trp neg, EKG w/ sinus tach, no st elevations   DM2     - SSI, DM diet, glucose checks, A1c  Morbid obesity     - counsel on diet, lifestyle changes  DVT prophylaxis: lovenox  Code Status: FULL  Family Communication: None at bedside  Consults called: None   Status is: Observation  The patient remains OBS appropriate and will d/c before 2 midnights.  Time spent coordinating admission: 50 minutes  [place] [facility]  If 7PM-7AM, please contact night-coverage [email or web address]  10/23/2021, 9:03 AM

## 2021-10-23 NOTE — ED Triage Notes (Signed)
Patient is complaining of sob. Patient is able to make full sentences. Patient is walking with no problems.

## 2021-10-23 NOTE — ED Provider Notes (Signed)
[facility] DEPT Provider Note   CSN: [id number] Arrival date & time: 10/23/21  0126     History Chief Complaint  Patient presents with   Shortness of Breath    Kathryn Mahoney is a 60 y.o. female.  Patient with past medical history notable for CHF, COPD, asthma, diabetes, presents to the emergency department with a chief complaint of shortness of breath.  She states that her symptoms have been worsening over the past few days.  She reports being diagnosed with influenza about a week ago.  She states that her chest feels tight.  She states that she has had subjective fevers and chills.  She wears 2 L nasal cannula at home O2 as needed.  She states that she was not treated with anything when she was diagnosed with the flu.  She reports associated hoarse voice.  The history is provided by the patient. No language interpreter was used.      Past Medical History:  Diagnosis Date   Asthma    B12 deficiency    CHF (congestive heart failure) (HCC)    COPD (chronic obstructive pulmonary disease) (HCC)    Diabetes mellitus without complication (HCC)    Hypertension    Morbid obesity (HCC)    SOB (shortness of breath)    Urinary incontinence     Patient Active Problem List   Diagnosis Date Noted   Acute on chronic heart failure with preserved ejection fraction (HFpEF) ([place]) 07/22/2021   Diabetes mellitus type 2, uncontrolled 07/22/2021   Orthopnea 07/22/2021   Obesity, Class III, BMI 40-49.9 (morbid obesity) ([place]) 07/22/2021   Chronic diastolic heart failure ([place]) 01/06/2021   Nonrheumatic mitral valve regurgitation 01/06/2021   Diabetes mellitus type 2 in obese ([place]) 01/06/2021   CHF exacerbation ([place]) 09/03/2020   Type II or unspecified type diabetes mellitus without mention of complication, not stated as uncontrolled 11/07/2013   Hyperglycemia 11/06/2013   COPD exacerbation ([place]) 11/02/2013   PNA (pneumonia) 11/02/2013   Hypoxia 11/02/2013    Acute-on-chronic respiratory failure ([place]) 11/02/2013   COPD (chronic obstructive pulmonary disease) (HCC)    CHF (congestive heart failure) (HCC)    Urinary incontinence    Hypertension     Past Surgical History:  Procedure Laterality Date   CESAREAN SECTION     ECTOPIC PREGNANCY SURGERY     KNEE SURGERY       OB History     Gravida  5   Para  4   Term  4   Preterm      AB  1   Living  4      SAB      IAB      Ectopic  1   Multiple      Live Births              Family History  Problem Relation Age of Onset   Stroke Mother    Asthma Mother     Social History   Tobacco Use   Smoking status: Former    Packs/day: 1.50    Years: 30.00    Pack years: 45.00    Types: Cigarettes    Quit date: 10/27/2010    Years since quitting: 10.9   Smokeless tobacco: Never  Substance Use Topics   Alcohol use: No   Drug use: No    Home Medications Prior to Admission medications   Medication Sig Start Date End Date Taking? Authorizing Provider  acetaminophen (TYLENOL) 650 MG  CR tablet Take 650 mg by mouth every 8 (eight) hours as needed for pain.    [provider]  albuterol (PROVENTIL) (2.5 MG/3ML) 0.083% nebulizer solution Take 3 mLs (2.5 mg total) by nebulization every 6 (six) hours as needed for wheezing or shortness of breath. 03/21/17   Robinson, [place] N, PA-C  albuterol (VENTOLIN HFA) 108 (90 Base) MCG/ACT inhaler Inhale 2 puffs into the lungs every 6 (six) hours as needed for wheezing or shortness of breath. 11/05/13   [provider]  amLODipine (NORVASC) 10 MG tablet Take 1 tablet (10 mg total) by mouth daily. 08/24/21   Lynden Oxford Scales, PA-C  blood glucose meter kit and supplies KIT Dispense based on patient and insurance preference. Use up to four times daily as directed. 07/25/21   Florencia Reasons, MD  carvedilol (COREG) 6.25 MG tablet Take 1 tablet (6.25 mg total) by mouth 2 (two) times daily with a meal. 07/25/21   Florencia Reasons, MD   ergocalciferol (VITAMIN D2) 1.25 MG (50000 UT) capsule Take 50,000 Units by mouth once a week.    [provider]  ferrous sulfate 325 (65 FE) MG tablet Take 1 tablet (325 mg total) by mouth daily with breakfast. 07/26/21   Florencia Reasons, MD  furosemide (LASIX) 80 MG tablet Take 1 tablet (80 mg total) by mouth 2 (two) times daily. 07/25/21   Florencia Reasons, MD  glipiZIDE (GLUCOTROL XL) 5 MG 24 hr tablet Take 5 mg by mouth every evening.  08/24/20   [provider]  GUAIFENESIN PO Take 1 tablet by mouth daily as needed (CONGESTION).    [provider]  hydrocortisone 2.5 % cream Apply 1 application topically 3 (three) times daily as needed for itching.    [provider]  OXYGEN Inhale 2 L into the lungs as needed (shortness of breath).    [provider]  polyethylene glycol (MIRALAX / GLYCOLAX) 17 g packet Take 17 g by mouth daily. 07/26/21   Florencia Reasons, MD  sacubitril-valsartan (ENTRESTO) 24-26 MG Take 1 tablet by mouth 2 (two) times daily. 07/25/21   Florencia Reasons, MD  senna-docusate (SENOKOT-S) 8.6-50 MG tablet Take 1 tablet by mouth at bedtime. 07/25/21   Florencia Reasons, MD  SYMBICORT 80-4.5 MCG/ACT inhaler Inhale 2 puffs into the lungs 2 (two) times daily. 11/07/17   [provider]    Allergies    Contrast media [iodinated contrast media], Empagliflozin, Losartan, Torsemide, Lisinopril, Metformin, and Ondansetron  Review of Systems   Review of Systems  All other systems reviewed and are negative.  Physical Exam Updated Vital Signs BP (!) 142/89    Pulse (!) 112    Resp 19    SpO2 92%   Physical Exam Vitals and nursing note reviewed.  Constitutional:      General: She is not in acute distress.    Appearance: She is well-developed. She is obese.  HENT:     Head: Normocephalic and atraumatic.  Eyes:     Conjunctiva/sclera: Conjunctivae normal.  Cardiovascular:     Rate and Rhythm: Regular rhythm. Tachycardia present.     Heart sounds: No murmur  heard. Pulmonary:     Effort: Pulmonary effort is normal. No respiratory distress.     Breath sounds: Wheezing present.  Abdominal:     Palpations: Abdomen is soft.     Tenderness: There is no abdominal tenderness.  Musculoskeletal:        General: No swelling.     Cervical back: Neck  supple.  Skin:    General: Skin is warm and dry.     Capillary Refill: Capillary refill takes less than 2 seconds.  Neurological:     Mental Status: She is alert.  Psychiatric:        Mood and Affect: Mood normal.    ED Results / Procedures / Treatments   Labs (all labs ordered are listed, but only abnormal results are displayed) Labs Reviewed  RESP PANEL BY RT-PCR (FLU A&B, COVID) ARPGX2  CBC WITH DIFFERENTIAL/PLATELET  BASIC METABOLIC PANEL  BRAIN NATRIURETIC PEPTIDE  TROPONIN I (HIGH SENSITIVITY)    EKG None  Radiology DG Chest 2 View  Result Date: 10/23/2021 CLINICAL DATA:  Shortness of breath EXAM: CHEST - 2 VIEW COMPARISON:  07/22/2021 FINDINGS: The heart size and mediastinal contours are within normal limits. Both lungs are clear. The visualized skeletal structures are unremarkable. IMPRESSION: No active cardiopulmonary disease. Electronically Signed   By: Ulyses Jarred M.D.   On: 10/23/2021 01:51    Procedures .Critical Care Performed by: Montine Circle, PA-C Authorized by: Montine Circle, PA-C   Critical care provider statement:    Critical care time (minutes):  33   Critical care was necessary to treat or prevent imminent or life-threatening deterioration of the following conditions:  Respiratory failure   Critical care was time spent personally by me on the following activities:  Development of treatment plan with patient or surrogate, discussions with consultants, evaluation of patient's response to treatment, examination of patient, ordering and review of laboratory studies, ordering and review of radiographic studies, ordering and performing treatments and interventions,  pulse oximetry, re-evaluation of patient's condition and review of old charts   Medications Ordered in ED Medications  ipratropium-albuterol (DUONEB) 0.5-2.5 (3) MG/3ML nebulizer solution 3 mL (has no administration in time range)  methylPREDNISolone sodium succinate (SOLU-MEDROL) 125 mg/2 mL injection 125 mg (has no administration in time range)    ED Course  I have reviewed the triage vital signs and the nursing notes.  Pertinent labs & imaging results that were available during my care of the patient were reviewed by me and considered in my medical decision making (see chart for details).    MDM Rules/Calculators/A&P                         Patient here with shortness of breath.  She has history of COPD and CHF.  Chest x-ray does not show any signs of volume overload.  She is hypoxic to 88% on room air.  She states that she does use oxygen at home intermittently.  She has diffuse wheezing.  Will give albuterol treatment and reassess.  Patient still wheezing after second DuoNeb.  We will trial continuous albuterol treatment.  Patient will need repeat troponin.  BNP is still pending.  Patient signed out to oncoming team at shift change.    Final Clinical Impression(s) / ED Diagnoses Final diagnoses:  COPD exacerbation [facility])    Rx / DC Orders ED Discharge Orders     None        Montine Circle, PA-C 10/23/21 1610    Maudie Flakes, MD 10/23/21 [phone]

## 2021-10-23 NOTE — ED Provider Notes (Signed)
Received signout from previous provider, please see his note for complete H&P.  This is a 60 year old female significant history of COPD, CHF, asthma who presents with complaints of shortness of breath that has been worsening for the past several days.  She normally wears supplemental oxygen at 2 L at home.  She was previously diagnosed with flu.   On reassessment, patient is still having a bit difficulty talking, she still actively wheezing, heart rates in the 117s, and despite being on 2 L of O2, saturations around 89-90%.  Patient have received multiple breathing treatment in the ED, I felt patient would likely benefit hospital admission for further managements of her COPD exacerbation.  No evidence to suggest CHF exacerbation.  Negative delta troponin, doubt ACS.  9:04 AM Appreciate consultation from [facility] hospitalist, Dr. Marylyn Ishihara who agrees to see and will admit patient for further managements of her COPD exacerbation.  .Critical Care Performed by: Domenic Moras, PA-C Authorized by: Domenic Moras, PA-C   Critical care provider statement:    Critical care time (minutes):  40   Critical care was time spent personally by me on the following activities:  Development of treatment plan with patient or surrogate, discussions with consultants, evaluation of patient's response to treatment, examination of patient, ordering and review of laboratory studies, ordering and review of radiographic studies, ordering and performing treatments and interventions, pulse oximetry, re-evaluation of patient's condition and review of old charts  BP 122/70    Pulse (!) 110    Temp 98.1 F (36.7 C) (Oral)    Resp 19    SpO2 90%   Results for orders placed or performed during the hospital encounter of 10/23/21  Resp Panel by RT-PCR (Flu A&B, Covid) Nasopharyngeal Swab   Specimen: Nasopharyngeal Swab; Nasopharyngeal(NP) swabs in vial transport medium  Result Value Ref Range   SARS Coronavirus 2 by RT PCR NEGATIVE NEGATIVE    Influenza A by PCR NEGATIVE NEGATIVE   Influenza B by PCR NEGATIVE NEGATIVE  CBC with Differential/Platelet  Result Value Ref Range   WBC 12.0 (H) 4.0 - 10.5 K/uL   RBC 4.70 3.87 - 5.11 MIL/uL   Hemoglobin 12.0 12.0 - 15.0 g/dL   HCT 40.8 36.0 - 46.0 %   MCV 86.8 80.0 - 100.0 fL   MCH 25.5 (L) 26.0 - 34.0 pg   MCHC 29.4 (L) 30.0 - 36.0 g/dL   RDW 16.8 (H) 11.5 - 15.5 %   Platelets 460 (H) 150 - 400 K/uL   nRBC 0.0 0.0 - 0.2 %   Neutrophils Relative % 56 %   Neutro Abs 6.8 1.7 - 7.7 K/uL   Lymphocytes Relative 35 %   Lymphs Abs 4.2 (H) 0.7 - 4.0 K/uL   Monocytes Relative 6 %   Monocytes Absolute 0.7 0.1 - 1.0 K/uL   Eosinophils Relative 2 %   Eosinophils Absolute 0.2 0.0 - 0.5 K/uL   Basophils Relative 0 %   Basophils Absolute 0.0 0.0 - 0.1 K/uL   Immature Granulocytes 1 %   Abs Immature Granulocytes 0.07 0.00 - 0.07 K/uL  Basic metabolic panel  Result Value Ref Range   Sodium 141 135 - 145 mmol/L   Potassium 3.7 3.5 - 5.1 mmol/L   Chloride 96 (L) 98 - 111 mmol/L   CO2 36 (H) 22 - 32 mmol/L   Glucose, Bld 145 (H) 70 - 99 mg/dL   BUN 15 8 - 23 mg/dL   Creatinine, Ser 0.87 0.44 - 1.00 mg/dL  Calcium 9.3 8.9 - 10.3 mg/dL   GFR, Estimated >60 >60 mL/min   Anion gap 9 5 - 15  Brain natriuretic peptide  Result Value Ref Range   B Natriuretic Peptide 29.8 0.0 - 100.0 pg/mL  Troponin I (High Sensitivity)  Result Value Ref Range   Troponin I (High Sensitivity) 7 <18 ng/L  Troponin I (High Sensitivity)  Result Value Ref Range   Troponin I (High Sensitivity) 8 <18 ng/L   DG Chest 2 View  Result Date: 10/23/2021 CLINICAL DATA:  Shortness of breath EXAM: CHEST - 2 VIEW COMPARISON:  07/22/2021 FINDINGS: The heart size and mediastinal contours are within normal limits. Both lungs are clear. The visualized skeletal structures are unremarkable. IMPRESSION: No active cardiopulmonary disease. Electronically Signed   By: Ulyses Jarred M.D.   On: 10/23/2021 01:51      Domenic Moras,  PA-C 10/23/21 0919    Maudie Flakes, MD 10/24/21 [phone]

## 2021-10-23 NOTE — Progress Notes (Signed)
CPAP machine ready at pt bedside.  Pt states she will call when ready for sleep.

## 2021-10-23 NOTE — ED Notes (Signed)
Pt placed on oxygen at 2 L/min d/t sats 84% RA

## 2021-10-24 DIAGNOSIS — J441 Chronic obstructive pulmonary disease with (acute) exacerbation: Secondary | ICD-10-CM | POA: Diagnosis not present

## 2021-10-24 LAB — COMPREHENSIVE METABOLIC PANEL
ALT: 24 U/L (ref 0–44)
AST: 18 U/L (ref 15–41)
Albumin: 3.1 g/dL — ABNORMAL LOW (ref 3.5–5.0)
Alkaline Phosphatase: 88 U/L (ref 38–126)
Anion gap: 8 (ref 5–15)
BUN: 28 mg/dL — ABNORMAL HIGH (ref 8–23)
CO2: 31 mmol/L (ref 22–32)
Calcium: 9.3 mg/dL (ref 8.9–10.3)
Chloride: 98 mmol/L (ref 98–111)
Creatinine, Ser: 0.96 mg/dL (ref 0.44–1.00)
GFR, Estimated: >60 mL/min (ref >60)
Glucose, Bld: 263 mg/dL — ABNORMAL HIGH (ref 70–99)
Potassium: 4.1 mmol/L (ref 3.5–5.1)
Sodium: 137 mmol/L (ref 135–145)
Total Bilirubin: 0.7 mg/dL (ref 0.3–1.2)
Total Protein: 7.4 g/dL (ref 6.5–8.1)

## 2021-10-24 LAB — CBC
HCT: 36.1 % (ref 36.0–46.0)
Hemoglobin: 10.9 g/dL — ABNORMAL LOW (ref 12.0–15.0)
MCH: 25.7 pg — ABNORMAL LOW (ref 26.0–34.0)
MCHC: 30.2 g/dL (ref 30.0–36.0)
MCV: 85.1 fL (ref 80.0–100.0)
Platelets: 463 10*3/uL — ABNORMAL HIGH (ref 150–400)
RBC: 4.24 MIL/uL (ref 3.87–5.11)
RDW: 16.8 % — ABNORMAL HIGH (ref 11.5–15.5)
WBC: 16.7 10*3/uL — ABNORMAL HIGH (ref 4.0–10.5)
nRBC: 0 % (ref 0.0–0.2)

## 2021-10-24 LAB — GLUCOSE, CAPILLARY: Glucose-Capillary: 209 mg/dL — ABNORMAL HIGH (ref 70–99)

## 2021-10-24 LAB — HIV ANTIBODY (ROUTINE TESTING W REFLEX): HIV Screen 4th Generation wRfx: NONREACTIVE

## 2021-10-24 MED ORDER — PREDNISONE 20 MG PO TABS: 40.0000 mg | ORAL_TABLET | Freq: Every day | ORAL | 0 refills | Status: AC

## 2021-10-24 MED ORDER — SACUBITRIL-VALSARTAN 24-26 MG PO TABS: 1.0000 | ORAL_TABLET | Freq: Two times a day (BID) | ORAL | 0 refills | Status: DC

## 2021-10-24 MED ORDER — CHLORHEXIDINE GLUCONATE 0.12 % MT SOLN
15.0000 mL | Freq: Two times a day (BID) | OROMUCOSAL | Status: DC
  Administered 2021-10-24: 09:00:00 15 mL via OROMUCOSAL
  Filled 2021-10-24: qty 15

## 2021-10-24 MED ORDER — CARVEDILOL 6.25 MG PO TABS: 6.2500 mg | ORAL_TABLET | Freq: Two times a day (BID) | ORAL | 0 refills | Status: DC

## 2021-10-24 MED ORDER — ORAL CARE MOUTH RINSE: 15.0000 mL | Freq: Two times a day (BID) | OROMUCOSAL | Status: DC

## 2021-10-24 MED ORDER — GLIPIZIDE ER 5 MG PO TB24: 5.0000 mg | ORAL_TABLET | Freq: Every evening | ORAL | 0 refills | Status: DC

## 2021-10-24 MED ORDER — AMLODIPINE BESYLATE 10 MG PO TABS: 10.0000 mg | ORAL_TABLET | Freq: Every day | ORAL | 0 refills | Status: DC

## 2021-10-24 NOTE — Progress Notes (Signed)
AMBULATORY PULSE OXIMETRY SATURATION   Patient Saturations on Room Air at Rest = 94%  Patient Saturations on Room Air while Ambulating = 90-91%  No needs for continuous oxygen at this time.  Pt states she has PRN orders for home use.  MD notified.

## 2021-10-24 NOTE — Plan of Care (Signed)
Pt for discharge this morning.  AVS printed and reviewed with patient at bedside.  New medications and follow ups discussed.  All questions addressed, verbalized understanding. IV and telemetry removed.  Family transporting home.

## 2021-10-24 NOTE — Progress Notes (Signed)
Pt report to RN some pain with urination and stated " I can feel some UTI coming on". NP on-call notified. No new order received. Will continue to monitor. Delia Heady RN

## 2021-10-25 NOTE — Discharge Summary (Signed)
Physician Discharge Summary  Kathryn Mahoney UKG:[id number] DOB: 15-Jul-1961 DOA: 10/23/2021  PCP: [facility]  Admit date: 10/23/2021 Discharge date: 10/25/2021  Admitted From: home  Disposition:  home   Recommendations for Outpatient Follow-up:  Encourage weight loss  Home Health:  none  Discharge Condition:  stable   CODE STATUS:  full code   Diet recommendation:  heart healthy Consultations: none  Procedures/Studies: none   Discharge Diagnoses:  Principal Problem:   COPD exacerbation ([place]) Active Problems:   Diabetes mellitus type 2 in obese ([place])   Chronic heart failure with preserved ejection fraction (HFpEF) ([facility])   Obesity, Class III, BMI 40-49.9 (morbid obesity) ([place])     Brief Summary: is a 60 y.o. female with medical history significant of COPD/asthma on chronic 2L [place], DM2, HTN, morbid obesity, HFpEF. Presenting with shortness of breath. Symptoms started over a week ago. She had cough and dyspnea. She tried her regular inhalers and some OTC meds, but it didn't help. She saw her PCP who diagnosed her with a respiratory illness and prescribed an antibiotic that she can't name. She says she completed the regimen, but it did not help. She started having increased wheeze. Her symptoms worsened through last night, when she decided to come to the ED for help. She denies any other aggravating or alleviating factors.   Hospital Course:  COPD exacerbation - feeling significantly better today- has ambulated down the hall and is not hypoxic- will dc home with oral steroids  Chronic systolic CHF - compensated  DM2 - Hemoglobin A1C    Component Value Date/Time   HGBA1C 8.0 (H) 10/23/2021 1729   - cont home regimen      Discharge Exam: Vitals:   10/24/21 0504 10/24/21 0940  BP: (!) 139/91   Pulse: 99   Resp: 19   Temp: (!) 97.3 F (36.3 C)   SpO2: 95% 92%   Vitals:   10/24/21 0103 10/24/21 0238 10/24/21 0504 10/24/21 0940  BP:  137/81 (!)  139/91   Pulse:  (!) 104 99   Resp:  19 19   Temp:  (!) 97.5 F (36.4 C) (!) 97.3 F (36.3 C)   TempSrc:  Oral Oral   SpO2: 95% 99% 95% 92%    General: Pt is alert, awake, not in acute distress Cardiovascular: RRR, S1/S2 +, no rubs, no gallops Respiratory: CTA bilaterally, no wheezing, no rhonchi Abdominal: Soft, NT, ND, bowel sounds + Extremities: no edema, no cyanosis   Discharge Instructions  Discharge Instructions     Diet - low sodium heart healthy   Complete by: As directed    Increase activity slowly   Complete by: As directed       Allergies as of 10/24/2021       Reactions   Contrast Media [iodinated Contrast Media] Other (See Comments)   Coughing, sick to stomach, increase blood pressure    Empagliflozin Other (See Comments)   Yeast infection   Losartan Other (See Comments)   Back pain   Torsemide Hives   Lisinopril Nausea And Vomiting   Metformin Palpitations   Ondansetron Other (See Comments), Swelling        Medication List     STOP taking these medications    albuterol (2.5 MG/3ML) 0.083% nebulizer solution Commonly known as: PROVENTIL   albuterol 108 (90 Base) MCG/ACT inhaler Commonly known as: VENTOLIN HFA   oseltamivir 75 MG capsule Commonly known as: TAMIFLU   OXYGEN       TAKE these  medications    acetaminophen 650 MG CR tablet Commonly known as: TYLENOL Take 650 mg by mouth every 8 (eight) hours as needed for pain.   amLODipine 10 MG tablet Commonly known as: NORVASC Take 1 tablet (10 mg total) by mouth daily.   blood glucose meter kit and supplies Kit Dispense based on patient and insurance preference. Use up to four times daily as directed.   carvedilol 6.25 MG tablet Commonly known as: COREG Take 1 tablet (6.25 mg total) by mouth 2 (two) times daily with a meal.   ergocalciferol 1.25 MG (50000 UT) capsule Commonly known as: VITAMIN D2 Take 50,000 Units by mouth once a week.   ferrous sulfate 325 (65 FE) MG  tablet Take 1 tablet (325 mg total) by mouth daily with breakfast.   furosemide 80 MG tablet Commonly known as: LASIX Take 1 tablet (80 mg total) by mouth 2 (two) times daily.   glipiZIDE 5 MG 24 hr tablet Commonly known as: GLUCOTROL XL Take 1 tablet (5 mg total) by mouth every evening.   GUAIFENESIN PO Take 1 tablet by mouth daily as needed (CONGESTION).   hydrocortisone 2.5 % cream Apply 1 application topically 3 (three) times daily as needed for itching.   polyethylene glycol 17 g packet Commonly known as: MIRALAX / GLYCOLAX Take 17 g by mouth daily.   predniSONE 20 MG tablet Commonly known as: DELTASONE Take 2 tablets (40 mg total) by mouth daily with breakfast for 5 days.   sacubitril-valsartan 24-26 MG Commonly known as: ENTRESTO Take 1 tablet by mouth 2 (two) times daily.   senna-docusate 8.6-50 MG tablet Commonly known as: Senokot-S Take 1 tablet by mouth at bedtime.   Symbicort 80-4.5 MCG/ACT inhaler Generic drug: budesonide-formoterol Inhale 2 puffs into the lungs 2 (two) times daily.        Follow-up Information     Buford Dresser, MD .   Specialty: Cardiology Contact information: [address] [place] [address] [phone]                Allergies  Allergen Reactions   Contrast Media [Iodinated Contrast Media] Other (See Comments)    Coughing, sick to stomach, increase blood pressure    Empagliflozin Other (See Comments)    Yeast infection   Losartan Other (See Comments)    Back pain   Torsemide Hives   Lisinopril Nausea And Vomiting   Metformin Palpitations   Ondansetron Other (See Comments) and Swelling      DG Chest 2 View  Result Date: 10/23/2021 CLINICAL DATA:  Shortness of breath EXAM: CHEST - 2 VIEW COMPARISON:  07/22/2021 FINDINGS: The heart size and mediastinal contours are within normal limits. Both lungs are clear. The visualized skeletal structures are unremarkable. IMPRESSION: No active  cardiopulmonary disease. Electronically Signed   By: Ulyses Jarred M.D.   On: 10/23/2021 01:51     The results of significant diagnostics from this hospitalization (including imaging, microbiology, ancillary and laboratory) are listed below for reference.     Microbiology: Recent Results (from the past 240 hour(s))  Resp Panel by RT-PCR (Flu A&B, Covid) Nasopharyngeal Swab     Status: None   Collection Time: 10/23/21  5:11 AM   Specimen: Nasopharyngeal Swab; Nasopharyngeal(NP) swabs in vial transport medium  Result Value Ref Range Status   SARS Coronavirus 2 by RT PCR NEGATIVE NEGATIVE Final    Comment: (NOTE) SARS-CoV-2 target nucleic acids are NOT DETECTED.  The SARS-CoV-2 RNA is generally detectable in  upper respiratory specimens during the acute phase of infection. The lowest concentration of SARS-CoV-2 viral copies this assay can detect is 138 copies/mL. A negative result does not preclude SARS-Cov-2 infection and should not be used as the sole basis for treatment or other patient management decisions. A negative result may occur with  improper specimen collection/handling, submission of specimen other than nasopharyngeal swab, presence of viral mutation(s) within the areas targeted by this assay, and inadequate number of viral copies(<138 copies/mL). A negative result must be combined with clinical observations, patient history, and epidemiological information. The expected result is Negative.  Fact Sheet for Patients:  EntrepreneurPulse.com.au  Fact Sheet for Healthcare Providers:  IncredibleEmployment.be  This test is no t yet approved or cleared by the [place] FDA and  has been authorized for detection and/or diagnosis of SARS-CoV-2 by FDA under an Emergency Use Authorization (EUA). This EUA will remain  in effect (meaning this test can be used) for the duration of the COVID-19 declaration under Section 564(b)(1) of the Act,  21 U.S.C.section 360bbb-3(b)(1), unless the authorization is terminated  or revoked sooner.       Influenza A by PCR NEGATIVE NEGATIVE Final   Influenza B by PCR NEGATIVE NEGATIVE Final    Comment: (NOTE) The Xpert Xpress SARS-CoV-2/FLU/RSV plus assay is intended as an aid in the diagnosis of influenza from Nasopharyngeal swab specimens and should not be used as a sole basis for treatment. Nasal washings and aspirates are unacceptable for Xpert Xpress SARS-CoV-2/FLU/RSV testing.  Fact Sheet for Patients: EntrepreneurPulse.com.au  Fact Sheet for Healthcare Providers: IncredibleEmployment.be  This test is not yet approved or cleared by the [place] FDA and has been authorized for detection and/or diagnosis of SARS-CoV-2 by FDA under an Emergency Use Authorization (EUA). This EUA will remain in effect (meaning this test can be used) for the duration of the COVID-19 declaration under Section 564(b)(1) of the Act, 21 U.S.C. section 360bbb-3(b)(1), unless the authorization is terminated or revoked.  Performed at [facility], [place] [address] [place], [place] [address]      Labs: BNP (last 3 results) Recent Labs    07/22/21 0211 10/23/21 0511  BNP 42.8 16.0   Basic Metabolic Panel: Recent Labs  Lab 10/23/21 0511 10/24/21 0525  NA 141 137  K 3.7 4.1  CL 96* 98  CO2 36* 31  GLUCOSE 145* 263*  BUN 15 28*  CREATININE 0.87 0.96  CALCIUM 9.3 9.3   Liver Function Tests: Recent Labs  Lab 10/24/21 0525  AST 18  ALT 24  ALKPHOS 88  BILITOT 0.7  PROT 7.4  ALBUMIN 3.1*   No results for input(s): LIPASE, AMYLASE in the last 168 hours. No results for input(s): AMMONIA in the last 168 hours. CBC: Recent Labs  Lab 10/23/21 0511 10/24/21 0525  WBC 12.0* 16.7*  NEUTROABS 6.8  --   HGB 12.0 10.9*  HCT 40.8 36.1  MCV 86.8 85.1  PLT 460* 463*   Cardiac Enzymes: No results for input(s): CKTOTAL, CKMB,  CKMBINDEX, TROPONINI in the last 168 hours. BNP: Invalid input(s): POCBNP CBG: Recent Labs  Lab 10/23/21 1258 10/23/21 1727 10/23/21 2215 10/24/21 0757  GLUCAP 310* 335* 218* 209*   D-Dimer No results for input(s): DDIMER in the last 72 hours. Hgb A1c Recent Labs    10/23/21 1729  HGBA1C 8.0*   Lipid Profile No results for input(s): CHOL, HDL, LDLCALC, TRIG, CHOLHDL, LDLDIRECT in the last 72 hours. Thyroid function studies No results for input(s):  TSH, T4TOTAL, T3FREE, THYROIDAB in the last 72 hours.  Invalid input(s): FREET3 Anemia work up No results for input(s): VITAMINB12, FOLATE, FERRITIN, TIBC, IRON, RETICCTPCT in the last 72 hours. Urinalysis    Component Value Date/Time   COLORURINE YELLOW (A) 07/22/2021 0211   APPEARANCEUR CLEAR (A) 07/22/2021 0211   LABSPEC <=1.005 07/22/2021 0211   PHURINE 6.0 07/22/2021 0211   GLUCOSEU NEGATIVE 07/22/2021 0211   HGBUR TRACE (A) 07/22/2021 0211   BILIRUBINUR NEGATIVE 07/22/2021 0211   KETONESUR NEGATIVE 07/22/2021 0211   PROTEINUR NEGATIVE 07/22/2021 0211   UROBILINOGEN 0.2 01/16/2020 1152   NITRITE NEGATIVE 07/22/2021 0211   LEUKOCYTESUR NEGATIVE 07/22/2021 0211   Sepsis Labs Invalid input(s): PROCALCITONIN,  WBC,  LACTICIDVEN Microbiology Recent Results (from the past 240 hour(s))  Resp Panel by RT-PCR (Flu A&B, Covid) Nasopharyngeal Swab     Status: None   Collection Time: 10/23/21  5:11 AM   Specimen: Nasopharyngeal Swab; Nasopharyngeal(NP) swabs in vial transport medium  Result Value Ref Range Status   SARS Coronavirus 2 by RT PCR NEGATIVE NEGATIVE Final    Comment: (NOTE) SARS-CoV-2 target nucleic acids are NOT DETECTED.  The SARS-CoV-2 RNA is generally detectable in upper respiratory specimens during the acute phase of infection. The lowest concentration of SARS-CoV-2 viral copies this assay can detect is 138 copies/mL. A negative result does not preclude SARS-Cov-2 infection and should not be used as the  sole basis for treatment or other patient management decisions. A negative result may occur with  improper specimen collection/handling, submission of specimen other than nasopharyngeal swab, presence of viral mutation(s) within the areas targeted by this assay, and inadequate number of viral copies(<138 copies/mL). A negative result must be combined with clinical observations, patient history, and epidemiological information. The expected result is Negative.  Fact Sheet for Patients:  EntrepreneurPulse.com.au  Fact Sheet for Healthcare Providers:  IncredibleEmployment.be  This test is no t yet approved or cleared by the [place] FDA and  has been authorized for detection and/or diagnosis of SARS-CoV-2 by FDA under an Emergency Use Authorization (EUA). This EUA will remain  in effect (meaning this test can be used) for the duration of the COVID-19 declaration under Section 564(b)(1) of the Act, 21 U.S.C.section 360bbb-3(b)(1), unless the authorization is terminated  or revoked sooner.       Influenza A by PCR NEGATIVE NEGATIVE Final   Influenza B by PCR NEGATIVE NEGATIVE Final    Comment: (NOTE) The Xpert Xpress SARS-CoV-2/FLU/RSV plus assay is intended as an aid in the diagnosis of influenza from Nasopharyngeal swab specimens and should not be used as a sole basis for treatment. Nasal washings and aspirates are unacceptable for Xpert Xpress SARS-CoV-2/FLU/RSV testing.  Fact Sheet for Patients: EntrepreneurPulse.com.au  Fact Sheet for Healthcare Providers: IncredibleEmployment.be  This test is not yet approved or cleared by the [place] FDA and has been authorized for detection and/or diagnosis of SARS-CoV-2 by FDA under an Emergency Use Authorization (EUA). This EUA will remain in effect (meaning this test can be used) for the duration of the COVID-19 declaration under Section 564(b)(1) of the  Act, 21 U.S.C. section 360bbb-3(b)(1), unless the authorization is terminated or revoked.  Performed at [facility], [place] [address], [place], [place] [address]      Time coordinating discharge in minutes: 65  SIGNED:   Debbe Odea, MD  [facility] 10/25/2021, 5:12 PM

## 2022-01-08 ENCOUNTER — Other Ambulatory Visit: Payer: Self-pay | Admitting: Cardiology

## 2022-01-09 ENCOUNTER — Encounter (HOSPITAL_COMMUNITY): Payer: Self-pay

## 2022-01-09 ENCOUNTER — Emergency Department (HOSPITAL_COMMUNITY)
Admission: EM | Admit: 2022-01-09 | Discharge: 2022-01-09 | Disposition: A | Discharge status: Home / Self Care | Payer: Medicaid Other | Attending: Emergency Medicine | Admitting: Emergency Medicine

## 2022-01-09 ENCOUNTER — Emergency Department (HOSPITAL_COMMUNITY): Payer: Medicaid Other

## 2022-01-09 DIAGNOSIS — Z20822 Contact with and (suspected) exposure to covid-19: Secondary | ICD-10-CM | POA: Diagnosis not present

## 2022-01-09 DIAGNOSIS — Z7984 Long term (current) use of oral hypoglycemic drugs: Secondary | ICD-10-CM | POA: Insufficient documentation

## 2022-01-09 DIAGNOSIS — E119 Type 2 diabetes mellitus without complications: Secondary | ICD-10-CM | POA: Diagnosis not present

## 2022-01-09 DIAGNOSIS — I110 Hypertensive heart disease with heart failure: Secondary | ICD-10-CM | POA: Insufficient documentation

## 2022-01-09 DIAGNOSIS — R0602 Shortness of breath: Secondary | ICD-10-CM | POA: Insufficient documentation

## 2022-01-09 DIAGNOSIS — I509 Heart failure, unspecified: Secondary | ICD-10-CM | POA: Diagnosis not present

## 2022-01-09 DIAGNOSIS — R319 Hematuria, unspecified: Secondary | ICD-10-CM | POA: Insufficient documentation

## 2022-01-09 DIAGNOSIS — J449 Chronic obstructive pulmonary disease, unspecified: Secondary | ICD-10-CM | POA: Diagnosis not present

## 2022-01-09 LAB — RESP PANEL BY RT-PCR (FLU A&B, COVID) ARPGX2
Influenza A by PCR: NEGATIVE
Influenza B by PCR: NEGATIVE
SARS Coronavirus 2 by RT PCR: NEGATIVE

## 2022-01-09 LAB — BASIC METABOLIC PANEL
Anion gap: 9 (ref 5–15)
BUN: 22 mg/dL (ref 8–23)
CO2: 34 mmol/L — ABNORMAL HIGH (ref 22–32)
Calcium: 8.8 mg/dL — ABNORMAL LOW (ref 8.9–10.3)
Chloride: 94 mmol/L — ABNORMAL LOW (ref 98–111)
Creatinine, Ser: 0.85 mg/dL (ref 0.44–1.00)
GFR, Estimated: >60 mL/min (ref >60)
Glucose, Bld: 293 mg/dL — ABNORMAL HIGH (ref 70–99)
Potassium: 3.8 mmol/L (ref 3.5–5.1)
Sodium: 137 mmol/L (ref 135–145)

## 2022-01-09 LAB — URINALYSIS, ROUTINE W REFLEX MICROSCOPIC
Bilirubin Urine: NEGATIVE
Glucose, UA: NEGATIVE mg/dL
Ketones, ur: NEGATIVE mg/dL
Leukocytes,Ua: NEGATIVE
Nitrite: NEGATIVE
Protein, ur: NEGATIVE mg/dL
RBC / HPF: >50 RBC/hpf — ABNORMAL HIGH (ref 0–5)
Specific Gravity, Urine: 1.013 (ref 1.005–1.030)
pH: 6 (ref 5.0–8.0)

## 2022-01-09 LAB — CBC
HCT: 39 % (ref 36.0–46.0)
Hemoglobin: 11.6 g/dL — ABNORMAL LOW (ref 12.0–15.0)
MCH: 25.3 pg — ABNORMAL LOW (ref 26.0–34.0)
MCHC: 29.7 g/dL — ABNORMAL LOW (ref 30.0–36.0)
MCV: 85.2 fL (ref 80.0–100.0)
Platelets: 445 10*3/uL — ABNORMAL HIGH (ref 150–400)
RBC: 4.58 MIL/uL (ref 3.87–5.11)
RDW: 18.1 % — ABNORMAL HIGH (ref 11.5–15.5)
WBC: 10.1 10*3/uL (ref 4.0–10.5)
nRBC: 0 % (ref 0.0–0.2)

## 2022-01-09 LAB — BRAIN NATRIURETIC PEPTIDE: B Natriuretic Peptide: 49.3 pg/mL (ref 0.0–100.0)

## 2022-01-09 LAB — TROPONIN I (HIGH SENSITIVITY): Troponin I (High Sensitivity): 13 ng/L (ref <18)

## 2022-01-09 MED ORDER — FUROSEMIDE 10 MG/ML IJ SOLN
80.0000 mg | Freq: Once | INTRAMUSCULAR | Status: AC
  Administered 2022-01-09: 80 mg via INTRAVENOUS
  Filled 2022-01-09: qty 8

## 2022-01-09 MED ORDER — TORSEMIDE 20 MG PO TABS: 60.0000 mg | ORAL_TABLET | Freq: Two times a day (BID) | ORAL | 0 refills | Status: DC

## 2022-01-09 NOTE — ED Provider Notes (Signed)
[facility]-EMERGENCY DEPT Provider Note   CSN: [id number] Arrival date & time: 01/09/22  1227     History  Chief Complaint  Patient presents with   Chest Pain    Kathryn Mahoney is a 61 y.o. female with history of COPD on chronic 2 L nasal cannula, type 2 diabetes, obesity, hypertension, congestive heart failure, presenting to the ED with shortness of breath.  Per my review of external records, the patient's last echocardiogram in September 2022, showing an EF of 50 to 55%.  No regional wall motion abnormalities.  Moderate left ventricular hypertrophy.  Mildly elevated pulmonary arterial systolic pressure, otherwise right ventricular function normal.  There is mild to moderate mitral regurg.  HPI     Home Medications Prior to Admission medications   Medication Sig Start Date End Date Taking? Authorizing Provider  acetaminophen (TYLENOL) 500 MG tablet Take 1,000 mg by mouth daily as needed for moderate pain or mild pain.   Yes [provider]  amLODipine (NORVASC) 5 MG tablet Take 5 mg by mouth daily. 01/07/22  Yes [provider]  budesonide-formoterol (SYMBICORT) 160-4.5 MCG/ACT inhaler Inhale 2 puffs into the lungs 2 (two) times daily. 01/07/22  Yes [provider]  Cyanocobalamin (B-12 PO) Take 1 tablet by mouth daily.   Yes [provider]  ergocalciferol (VITAMIN D2) 1.25 MG (50000 UT) capsule Take 50,000 Units by mouth once a week.   Yes [provider]  ferrous sulfate 325 (65 FE) MG tablet Take 1 tablet (325 mg total) by mouth daily with breakfast. 07/26/21  Yes Albertine Grates, MD  glipiZIDE (GLUCOTROL) 5 MG tablet Take 5 mg by mouth daily. 01/07/22  Yes [provider]  guaiFENesin (MUCINEX) 600 MG 12 hr tablet Take 600 mg by mouth 2 (two) times daily as needed for to loosen phlegm.   Yes [provider]  hydrocortisone cream 1 % Apply 1 application. topically 2 (two) times daily as needed for  itching.   Yes [provider]  meloxicam (MOBIC) 15 MG tablet Take 15 mg by mouth daily. 12/31/21  Yes [provider]  VENTOLIN HFA 108 (90 Base) MCG/ACT inhaler Inhale 2 puffs into the lungs 3 (three) times daily as needed for wheezing, shortness of breath or wheezing. 12/16/21  Yes [provider]  amLODipine (NORVASC) 10 MG tablet Take 1 tablet (10 mg total) by mouth daily. Patient not taking: Reported on 01/09/2022 10/24/21   Calvert Cantor, MD  blood glucose meter kit and supplies KIT Dispense based on patient and insurance preference. Use up to four times daily as directed. 07/25/21   Albertine Grates, MD  carvedilol (COREG) 6.25 MG tablet Take 1 tablet (6.25 mg total) by mouth 2 (two) times daily with a meal. Patient not taking: Reported on 01/09/2022 10/24/21   Calvert Cantor, MD  furosemide (LASIX) 80 MG tablet Take 1 tablet (80 mg total) by mouth 2 (two) times daily. Patient not taking: Reported on 01/09/2022 07/25/21   Albertine Grates, MD  glipiZIDE (GLUCOTROL XL) 5 MG 24 hr tablet Take 1 tablet (5 mg total) by mouth every evening. Patient not taking: Reported on 01/09/2022 10/24/21 03/01/22  Calvert Cantor, MD  polyethylene glycol (MIRALAX / GLYCOLAX) 17 g packet Take 17 g by mouth daily. Patient not taking: Reported on 01/09/2022 07/26/21   Albertine Grates, MD  sacubitril-valsartan (ENTRESTO) 24-26 MG Take 1 tablet by mouth 2 (two) times daily. Patient not taking: Reported on 01/09/2022 10/24/21   Calvert Cantor, MD  senna-docusate (SENOKOT-S) 8.6-50 MG tablet Take 1 tablet by mouth at bedtime. Patient not taking: Reported on 10/23/2021 07/25/21   Albertine Grates, MD  torsemide (DEMADEX) 20 MG tablet Take 3 tablets (60 mg total) by mouth 2 (two) times daily. 01/09/22 02/08/22  Terald Sleeper, MD      Allergies    Contrast media [iodinated contrast media], Empagliflozin, Losartan, Torsemide, Lisinopril, Metformin, and Ondansetron    Review of Systems   Review of Systems  Physical Exam Updated  Vital Signs BP 134/84   Pulse (!) 101   Temp 98.6 F (37 C) (Oral)   Resp 18   SpO2 94%  Physical Exam  ED Results / Procedures / Treatments   Labs (all labs ordered are listed, but only abnormal results are displayed) Labs Reviewed  BASIC METABOLIC PANEL - Abnormal; Notable for the following components:      Result Value   Chloride 94 (*)    CO2 34 (*)    Glucose, Bld 293 (*)    Calcium 8.8 (*)    All other components within normal limits  CBC - Abnormal; Notable for the following components:   Hemoglobin 11.6 (*)    MCH 25.3 (*)    MCHC 29.7 (*)    RDW 18.1 (*)    Platelets 445 (*)    All other components within normal limits  URINALYSIS, ROUTINE W REFLEX MICROSCOPIC - Abnormal; Notable for the following components:   APPearance HAZY (*)    Hgb urine dipstick LARGE (*)    RBC / HPF >50 (*)    Bacteria, UA RARE (*)    All other components within normal limits  RESP PANEL BY RT-PCR (FLU A&B, COVID) [id number]  BRAIN NATRIURETIC PEPTIDE  TROPONIN I (HIGH SENSITIVITY)    EKG EKG Interpretation  Date/Time:  Thursday January 09 2022 12:42:20 EDT Ventricular Rate:  105 PR Interval:  167 QRS Duration: 104 QT Interval:  362 QTC Calculation: 479 R Axis:   80 Text Interpretation: Sinus tachycardia  Frequent PVC No STEMI Confirmed by Alvester Chou [phone]) on 01/09/2022 12:55:43 PM  Radiology DG Chest 2 View  Result Date: 01/09/2022 CLINICAL DATA:  A 61 year old female presents for evaluation of chest pain and shortness of breath. EXAM: CHEST - 2 VIEW COMPARISON:  October 23, 2021. FINDINGS: EKG leads project over the chest. Heart size is enlarged and accentuated by portable technique and AP projection. Heart size similar to previous portable radiographs. Pulmonary vascular congestion without signs of frank edema. No visible pneumothorax. No lobar consolidation. On limited assessment there is no acute skeletal process. IMPRESSION: Marked cardiomegaly and pulmonary vascular  congestion without signs of frank edema or consolidation. Electronically Signed   By: Donzetta Kohut M.D.   On: 01/09/2022 13:50    Procedures Procedures    Medications Ordered in ED Medications  furosemide (LASIX) injection 80 mg (80 mg Intravenous Given 01/09/22 1347)    ED Course/ Medical Decision Making/ A&P Clinical Course as of 01/09/22 1710  Thu Jan 09, 2022  1357 PRESSION: Marked cardiomegaly and pulmonary vascular congestion without signs of frank edema or consolidation. [MT]  1452 Pt able to produce large amount of urine.  She is back on her baseline 2L [place] and has no tachypnea.  I suspect this was a CHF exacerbation gradual onset and she needs to increase her torsemide dosing from 40 mg to 60 mg daily, and contact her PCP about a repeat echocardiogram - i'll send a staff message at well. [MT]  1513 She has hematuria without evidence of UTI or dysuria.  No abd pain to suggest kidney stone.  She can f/u with urology if this persists; unclear etiology ath this time, but hgb is stable and this is not likely lifethreatenign. [MT]    Clinical Course User Index [MT] Imoni Kohen, Kermit Balo, MD                           Medical Decision Making Amount and/or Complexity of Data Reviewed Labs: ordered. Radiology: ordered.  Risk Prescription drug management.   SOB, hematuria Ddx includes chf exacerbation vs PNA vs viral infection vs anemia vs other  Pt breathing comfortably, stable on baseline O2 saturation of 2L [place] No wheezing on exam, but does have crackles Xray per my review and interpretation shows some pulm edema, no PNA No leukocytosis or acute anemia  Trop 12, ECG does not show acute ischemia; I have a lower suspicion for ACS IV diuresis ordered with torsemide, with subsequent production of urine on reassessment, patient remains breathing comfortably. UA with +hgb, unclear origin, no sign of UTI per UA; no abd pain to suspect ureteral stone; Cr at baseline; will need urology  f/u as outpatient if this persists.   Low suspicion for PE clinically Recommend cardiology f/u - we discussed observation in the hospital for continued diuresis but she would prefer to go home, and I think this is a reasonable option.  Suspect CHF has been chronically worsening and she is needing more diuresis at home, which she was resistant to initially per her PCP note, but now is agreeable to  External records reviewed - last echocardiogram as noted in history above        Final Clinical Impression(s) / ED Diagnoses Final diagnoses:  Acute on chronic congestive heart failure, unspecified heart failure type (HCC)  Shortness of breath  Hematuria, unspecified type    Rx / DC Orders ED Discharge Orders          Ordered    torsemide (DEMADEX) 20 MG tablet  2 times daily        01/09/22 1516              Terald Sleeper, MD 01/09/22 1710

## 2022-01-09 NOTE — ED Triage Notes (Signed)
Pt presents with c/o chest pain. Pt reports she is on Lasix and her MD wants to increase it but she does not want to increase it as she does not want to be urinating more. Pt also not wearing her home oxygen. Pt 85% on RA upon arrival, c/o some pain in her chest.  ?

## 2022-01-09 NOTE — Discharge Instructions (Addendum)
Please call tomorrow morning to arrange for follow-up appointment with your primary care doctors and your cardiologist.  You need another echocardiogram or ultrasound of your heart to look to see how it is pumping.  You also need to increase your Torsemide dose in the morning from 40 mg to 60 mg once daily.  This will help you pee out extra fluid from your lungs. ? ?For the blood in your urine you should talk to your primary care provider.  Your urine sample does not show signs of infection.  It is possible you have a small kidney stone or another cause of bleeding.  If the bleeding continues in your urine after several days you likely need to follow up with a urologist, because this may be a more serious condition, such as bladder cancer. ? ? ?

## 2022-01-09 NOTE — ED Notes (Signed)
Pt transported to xray via stretcher

## 2022-01-22 ENCOUNTER — Encounter (HOSPITAL_COMMUNITY): Payer: Self-pay | Admitting: Emergency Medicine

## 2022-01-22 ENCOUNTER — Emergency Department (HOSPITAL_COMMUNITY): Payer: Medicaid Other

## 2022-01-22 ENCOUNTER — Emergency Department (HOSPITAL_COMMUNITY)
Admission: EM | Admit: 2022-01-22 | Discharge: 2022-01-22 | Disposition: A | Discharge status: Home / Self Care | Payer: Medicaid Other | Attending: Emergency Medicine | Admitting: Emergency Medicine

## 2022-01-22 DIAGNOSIS — J449 Chronic obstructive pulmonary disease, unspecified: Secondary | ICD-10-CM | POA: Diagnosis not present

## 2022-01-22 DIAGNOSIS — I10 Essential (primary) hypertension: Secondary | ICD-10-CM

## 2022-01-22 DIAGNOSIS — N950 Postmenopausal bleeding: Secondary | ICD-10-CM

## 2022-01-22 DIAGNOSIS — R0602 Shortness of breath: Secondary | ICD-10-CM | POA: Insufficient documentation

## 2022-01-22 DIAGNOSIS — Z7951 Long term (current) use of inhaled steroids: Secondary | ICD-10-CM | POA: Diagnosis not present

## 2022-01-22 DIAGNOSIS — R3129 Other microscopic hematuria: Secondary | ICD-10-CM

## 2022-01-22 DIAGNOSIS — I509 Heart failure, unspecified: Secondary | ICD-10-CM | POA: Insufficient documentation

## 2022-01-22 DIAGNOSIS — Z8679 Personal history of other diseases of the circulatory system: Secondary | ICD-10-CM

## 2022-01-22 DIAGNOSIS — J441 Chronic obstructive pulmonary disease with (acute) exacerbation: Secondary | ICD-10-CM

## 2022-01-22 LAB — TROPONIN I (HIGH SENSITIVITY): Troponin I (High Sensitivity): 12 ng/L (ref <18)

## 2022-01-22 LAB — CBC
HCT: 39.6 % (ref 36.0–46.0)
Hemoglobin: 11.8 g/dL — ABNORMAL LOW (ref 12.0–15.0)
MCH: 25 pg — ABNORMAL LOW (ref 26.0–34.0)
MCHC: 29.8 g/dL — ABNORMAL LOW (ref 30.0–36.0)
MCV: 83.9 fL (ref 80.0–100.0)
Platelets: 497 10*3/uL — ABNORMAL HIGH (ref 150–400)
RBC: 4.72 MIL/uL (ref 3.87–5.11)
RDW: 17.9 % — ABNORMAL HIGH (ref 11.5–15.5)
WBC: 11.3 10*3/uL — ABNORMAL HIGH (ref 4.0–10.5)
nRBC: 0 % (ref 0.0–0.2)

## 2022-01-22 LAB — BASIC METABOLIC PANEL
Anion gap: 9 (ref 5–15)
BUN: 19 mg/dL (ref 8–23)
CO2: 36 mmol/L — ABNORMAL HIGH (ref 22–32)
Calcium: 9.5 mg/dL (ref 8.9–10.3)
Chloride: 97 mmol/L — ABNORMAL LOW (ref 98–111)
Creatinine, Ser: 0.84 mg/dL (ref 0.44–1.00)
GFR, Estimated: >60 mL/min (ref >60)
Glucose, Bld: 211 mg/dL — ABNORMAL HIGH (ref 70–99)
Potassium: 3.6 mmol/L (ref 3.5–5.1)
Sodium: 142 mmol/L (ref 135–145)

## 2022-01-22 LAB — BRAIN NATRIURETIC PEPTIDE: B Natriuretic Peptide: 36.7 pg/mL (ref 0.0–100.0)

## 2022-01-22 MED ORDER — IPRATROPIUM BROMIDE 0.02 % IN SOLN
0.5000 mg | Freq: Once | RESPIRATORY_TRACT | Status: AC
  Administered 2022-01-22: 0.5 mg via RESPIRATORY_TRACT
  Filled 2022-01-22: qty 2.5

## 2022-01-22 MED ORDER — METHYLPREDNISOLONE SODIUM SUCC 125 MG IJ SOLR
125.0000 mg | Freq: Once | INTRAMUSCULAR | Status: AC
  Administered 2022-01-22: 125 mg via INTRAVENOUS
  Filled 2022-01-22: qty 2

## 2022-01-22 MED ORDER — ALBUTEROL SULFATE (2.5 MG/3ML) 0.083% IN NEBU
5.0000 mg | INHALATION_SOLUTION | Freq: Once | RESPIRATORY_TRACT | Status: AC
Start: 2022-01-22 — End: 2022-01-22
  Administered 2022-01-22: 5 mg via RESPIRATORY_TRACT
  Filled 2022-01-22: qty 6

## 2022-01-22 MED ORDER — IPRATROPIUM BROMIDE 0.02 % IN SOLN
0.5000 mg | Freq: Once | RESPIRATORY_TRACT | Status: AC
Start: 2022-01-22 — End: 2022-01-22
  Administered 2022-01-22: 0.5 mg via RESPIRATORY_TRACT
  Filled 2022-01-22: qty 2.5

## 2022-01-22 MED ORDER — ALBUTEROL SULFATE HFA 108 (90 BASE) MCG/ACT IN AERS: 2.0000 | INHALATION_SPRAY | RESPIRATORY_TRACT | 1 refills | Status: AC | PRN

## 2022-01-22 MED ORDER — ALBUTEROL SULFATE (2.5 MG/3ML) 0.083% IN NEBU
5.0000 mg | INHALATION_SOLUTION | Freq: Once | RESPIRATORY_TRACT | Status: AC
  Administered 2022-01-22: 5 mg via RESPIRATORY_TRACT
  Filled 2022-01-22: qty 6

## 2022-01-22 MED ORDER — PREDNISONE 20 MG PO TABS: 60.0000 mg | ORAL_TABLET | Freq: Every day | ORAL | 0 refills | Status: DC

## 2022-01-22 NOTE — ED Triage Notes (Signed)
Per pt, states she was here for the same symptoms a couple of weeks ago-states she was told to increase her lasix-states it is not working-states she has been bleeding vaginally since starting meloxicam  ?

## 2022-01-22 NOTE — ED Provider Triage Note (Signed)
Emergency Medicine Provider Triage Evaluation Note ? ?Watt Climes , a 61 y.o. female  was evaluated in triage.  Pt complains of shortness of breath, chest pain, vaginal bleeding.  The patient states that she was here a few days ago and was discharged home but feels that she needed more diuresis at the time.  The patient also states that she feels that she has a whole body infection because she is experiencing vaginal bleeding and has not had a period in 4 years.  The patient states that the vaginal bleeding started after taking meloxicam.  She has discontinued the medication at this time.  Denies fever, denies abdominal pain. ? ?Review of Systems  ?Positive: S of breath, chest pain, vaginal bleeding ?Negative: Fever, abdominal pain ? ?Physical Exam  ?BP (!) 144/82 (BP Location: Left Arm)   Pulse (!) 114   Temp 98.2 ?F (36.8 ?C) (Oral)   Resp 18   SpO2 93%  ?Gen:   Awake, no distress   ?Resp:  Normal effort  ?MSK:   Moves extremities without difficulty  ?Other:   ? ?Medical Decision Making  ?Medically screening exam initiated at 6:34 PM.  Appropriate orders placed.  Lyn Joens was informed that the remainder of the evaluation will be completed by another provider, this initial triage assessment does not replace that evaluation, and the importance of remaining in the ED until their evaluation is complete. ? ? ?  ?Dorothyann Peng, PA-C ?01/22/22 1835 ? ?

## 2022-01-22 NOTE — Discharge Instructions (Addendum)
It was our pleasure to provide your ER care today - we hope that you feel better. ? ?Take prednisone as prescribed. Use albuterol treatment as need.  ? ?Follow up closely with your doctor/cardiologist and pulmonary doctor in the next 1-2 weeks. ? ?For recent blood in urine, and vaginal bleeding - follow up with your doctor and gynecologist in the next 1-3 weeks for further imaging and testing - call office to arrange appointment.  ? ?Return to ER if worse, new symptoms, fevers, chest pain, increased trouble breathing, or other concern.  ?

## 2022-01-22 NOTE — ED Notes (Signed)
Patient verbalizes understanding of discharge instructions. Opportunity for questioning and answers were provided. Armband removed by staff, pt discharged from ED. Ambulated out to lobby  

## 2022-01-22 NOTE — ED Provider Notes (Addendum)
[facility]-EMERGENCY DEPT Provider Note   CSN: [id number] Arrival date & time: 01/22/22  1756     History  Chief Complaint  Patient presents with   Shortness of Breath    Kathryn Mahoney is a 61 y.o. female.  Pt with hx copd on home o2 and chf, c/o increased sob in past week and feeling as if retraining fluid. Symptoms gradual onset, moderate, persistent. States was seen in ED for same and diuretic dose increased but not feeling better. States compliant w home meds, urinating normal amount. States chest has felt tight but no discrete, episodic, or exertional chest pain. No pleuritic pain. Bilateral ankle and leg swelling. No asymmetric swelling or leg/calf pain. Denies increased cough or uri symptoms. No fever or chills.   The history is provided by the patient and medical records.  Shortness of Breath Associated symptoms: no abdominal pain, no cough, no fever, no headaches, no neck pain, no rash, no sore throat and no vomiting       Home Medications Prior to Admission medications   Medication Sig Start Date End Date Taking? Authorizing Provider  acetaminophen (TYLENOL) 500 MG tablet Take 1,000 mg by mouth daily as needed for moderate pain or mild pain.    [provider]  amLODipine (NORVASC) 10 MG tablet Take 1 tablet (10 mg total) by mouth daily. Patient not taking: Reported on 01/09/2022 10/24/21   Calvert Cantor, MD  amLODipine (NORVASC) 5 MG tablet Take 5 mg by mouth daily. 01/07/22   [provider]  blood glucose meter kit and supplies KIT Dispense based on patient and insurance preference. Use up to four times daily as directed. 07/25/21   Albertine Grates, MD  budesonide-formoterol [facility]) 160-4.5 MCG/ACT inhaler Inhale 2 puffs into the lungs 2 (two) times daily. 01/07/22   [provider]  carvedilol (COREG) 6.25 MG tablet Take 1 tablet (6.25 mg total) by mouth 2 (two) times daily with a meal. Patient not taking: Reported on  01/09/2022 10/24/21   Calvert Cantor, MD  Cyanocobalamin (B-12 PO) Take 1 tablet by mouth daily.    [provider]  ergocalciferol (VITAMIN D2) 1.25 MG (50000 UT) capsule Take 50,000 Units by mouth once a week.    [provider]  ferrous sulfate 325 (65 FE) MG tablet Take 1 tablet (325 mg total) by mouth daily with breakfast. 07/26/21   Albertine Grates, MD  furosemide (LASIX) 80 MG tablet Take 1 tablet (80 mg total) by mouth 2 (two) times daily. Patient not taking: Reported on 01/09/2022 07/25/21   Albertine Grates, MD  glipiZIDE (GLUCOTROL XL) 5 MG 24 hr tablet Take 1 tablet (5 mg total) by mouth every evening. Patient not taking: Reported on 01/09/2022 10/24/21 03/01/22  Calvert Cantor, MD  glipiZIDE (GLUCOTROL) 5 MG tablet Take 5 mg by mouth daily. 01/07/22   [provider]  guaiFENesin (MUCINEX) 600 MG 12 hr tablet Take 600 mg by mouth 2 (two) times daily as needed for to loosen phlegm.    [provider]  hydrocortisone cream 1 % Apply 1 application. topically 2 (two) times daily as needed for itching.    [provider]  meloxicam (MOBIC) 15 MG tablet Take 15 mg by mouth daily. 12/31/21   [provider]  polyethylene glycol (MIRALAX / GLYCOLAX) 17 g packet Take 17 g by mouth daily. Patient not taking: Reported on 01/09/2022 07/26/21   Albertine Grates, MD  sacubitril-valsartan (ENTRESTO) 24-26 MG Take 1 tablet by mouth 2 (  two) times daily. Patient not taking: Reported on 01/09/2022 10/24/21   Calvert Cantor, MD  senna-docusate (SENOKOT-S) 8.6-50 MG tablet Take 1 tablet by mouth at bedtime. Patient not taking: Reported on 10/23/2021 07/25/21   Albertine Grates, MD  torsemide (DEMADEX) 20 MG tablet Take 3 tablets (60 mg total) by mouth 2 (two) times daily. 01/09/22 02/08/22  Terald Sleeper, MD  VENTOLIN HFA 108 [phone] Base) MCG/ACT inhaler Inhale 2 puffs into the lungs 3 (three) times daily as needed for wheezing, shortness of breath or wheezing. 12/16/21   [provider]       Allergies    Contrast media [iodinated contrast media], Empagliflozin, Losartan, Torsemide, Lisinopril, Metformin, and Ondansetron    Review of Systems   Review of Systems  Constitutional:  Negative for chills and fever.  HENT:  Negative for sore throat.   Eyes:  Negative for redness.  Respiratory:  Positive for shortness of breath. Negative for cough.   Cardiovascular:  Positive for leg swelling. Negative for palpitations.  Gastrointestinal:  Negative for abdominal pain, diarrhea and vomiting.  Genitourinary:  Negative for dysuria and flank pain.  Musculoskeletal:  Negative for back pain and neck pain.  Skin:  Negative for rash.  Neurological:  Negative for headaches.  Hematological:  Does not bruise/bleed easily.  Psychiatric/Behavioral:  Negative for confusion.    Physical Exam Updated Vital Signs BP (!) 144/82 (BP Location: Left Arm)   Pulse (!) 114   Temp 98.2 F (36.8 C) (Oral)   Resp 18   SpO2 93%  Physical Exam Vitals and nursing note reviewed.  Constitutional:      Appearance: Normal appearance. She is well-developed.  HENT:     Head: Atraumatic.     Nose: Nose normal.     Mouth/Throat:     Mouth: Mucous membranes are moist.  Eyes:     General: No scleral icterus.    Conjunctiva/sclera: Conjunctivae normal.  Neck:     Trachea: No tracheal deviation.  Cardiovascular:     Rate and Rhythm: Normal rate and regular rhythm.     Pulses: Normal pulses.     Heart sounds: Normal heart sounds. No murmur heard.   No friction rub. No gallop.  Pulmonary:     Effort: Pulmonary effort is normal. No respiratory distress.     Breath sounds: Wheezing present.     Comments: Mild wheezing.  Abdominal:     General: Bowel sounds are normal. There is no distension.     Palpations: Abdomen is soft.     Tenderness: There is no abdominal tenderness.  Genitourinary:    Comments: No cva tenderness.  Musculoskeletal:     Cervical back: Normal range of motion and neck supple. No  rigidity. No muscular tenderness.     Comments: Symmetric, bilateral ankle/lower leg edema.   Skin:    General: Skin is warm and dry.     Findings: No rash.  Neurological:     Mental Status: She is alert.     Comments: Alert, speech normal.   Psychiatric:        Mood and Affect: Mood normal.    ED Results / Procedures / Treatments   Labs (all labs ordered are listed, but only abnormal results are displayed) Results for orders placed or performed during the hospital encounter of 01/22/22  Basic metabolic panel  Result Value Ref Range   Sodium 142 135 - 145 mmol/L   Potassium 3.6 3.5 - 5.1 mmol/L   Chloride 97 (  L) 98 - 111 mmol/L   CO2 36 (H) 22 - 32 mmol/L   Glucose, Bld 211 (H) 70 - 99 mg/dL   BUN 19 8 - 23 mg/dL   Creatinine, Ser 1.19 0.44 - 1.00 mg/dL   Calcium 9.5 8.9 - 14.7 mg/dL   GFR, Estimated >82 >95 mL/min   Anion gap 9 5 - 15  CBC  Result Value Ref Range   WBC 11.3 (H) 4.0 - 10.5 K/uL   RBC 4.72 3.87 - 5.11 MIL/uL   Hemoglobin 11.8 (L) 12.0 - 15.0 g/dL   HCT 62.1 30.8 - 65.7 %   MCV 83.9 80.0 - 100.0 fL   MCH 25.0 (L) 26.0 - 34.0 pg   MCHC 29.8 (L) 30.0 - 36.0 g/dL   RDW 84.6 (H) 96.2 - 95.2 %   Platelets 497 (H) 150 - 400 K/uL   nRBC 0.0 0.0 - 0.2 %  Brain natriuretic peptide  Result Value Ref Range   B Natriuretic Peptide 36.7 0.0 - 100.0 pg/mL  Troponin I (High Sensitivity)  Result Value Ref Range   Troponin I (High Sensitivity) 12 <18 ng/L   DG Chest 2 View  Result Date: 01/09/2022 CLINICAL DATA:  A 61 year old female presents for evaluation of chest pain and shortness of breath. EXAM: CHEST - 2 VIEW COMPARISON:  October 23, 2021. FINDINGS: EKG leads project over the chest. Heart size is enlarged and accentuated by portable technique and AP projection. Heart size similar to previous portable radiographs. Pulmonary vascular congestion without signs of frank edema. No visible pneumothorax. No lobar consolidation. On limited assessment there is no acute  skeletal process. IMPRESSION: Marked cardiomegaly and pulmonary vascular congestion without signs of frank edema or consolidation. Electronically Signed   By: Donzetta Kohut M.D.   On: 01/09/2022 13:50     EKG EKG Interpretation  Date/Time:  Wednesday January 22 2022 18:06:38 EDT Ventricular Rate:  104 PR Interval:  174 QRS Duration: 102 QT Interval:  347 QTC Calculation: 457 R Axis:   68 Text Interpretation: Sinus tachycardia Baseline wander Confirmed by Cathren Laine ([id number]) on 01/22/2022 6:53:39 PM  Radiology DG Chest 2 View  Result Date: 01/22/2022 CLINICAL DATA:  Shortness of breath and chest pain. EXAM: CHEST - 2 VIEW COMPARISON:  01/09/2022 FINDINGS: The cardiac silhouette, mediastinal and hilar contours are within normal limits and stable. Streaky basilar scarring changes but no infiltrates or edema. The bony thorax is intact. IMPRESSION: Streaky basilar scarring changes.  No acute pulmonary findings. Electronically Signed   By: Rudie Meyer M.D.   On: 01/22/2022 19:23    Procedures Procedures    Medications Ordered in ED Medications - No data to display  ED Course/ Medical Decision Making/ A&P                           Medical Decision Making Problems Addressed: COPD exacerbation (HCC): acute illness or injury with systemic symptoms that poses a threat to life or bodily functions Essential hypertension: chronic illness or injury Hematuria, microscopic: acute illness or injury History of chronic CHF: chronic illness or injury Post-menopausal bleeding: acute illness or injury  Amount and/or Complexity of Data Reviewed External Data Reviewed: notes. Labs: ordered. Decision-making details documented in ED Course. Radiology: ordered and independent interpretation performed. Decision-making details documented in ED Course. ECG/medicine tests: ordered and independent interpretation performed. Decision-making details documented in ED Course.  Risk Prescription drug  management. Decision regarding hospitalization.   Iv ns.  Continuous pulse ox and cardiac monitoring. Labs ordered/sent. Imaging ordered. Disposition decision including potential need for admission considered - will get labs and imaging, and make dispo decision.   Solumedrol iv, albuterol and atrovent.   Reviewed nursing notes and prior charts for additional history. External reports reviewed.   Cardiac monitor: sinus rhythm, rate 110.  Labs reviewed/interpreted by me - bnp normal. Trop normal.   Xrays reviewed/interpreted by me - no edema or pna.   Wheezing improved.   Additional albuterol and atrovent neb.  Recheck, pt breathing comfortably. No wheezing or increased wob.   No cp or discomfort. Trop normal after symptoms for past couple days - felt not c/w acs.   Pt currently appears stable for d/c.  Rec pcp/card f/u as outpt.   Return precautions provided.   At d/c, notes recent intermittent, mild vaginal bleeding. No heavy or continual bleeding. No pelvic pain. Will do exam. On review recent labs last week, microscopic hematuria noted. Pt denies dysuria. No fever.  Discussed need to f/u for both issues with her doctor and gyn.   Patient declines pelvic exam in ED, or to stay for UA, indicates prefers to f/u with pcp/gyn as outpt, and requests d/c to home now.  Pt currently appears stable for d/c.  Return precautions provided.            Final Clinical Impression(s) / ED Diagnoses Final diagnoses:  None    Rx / DC Orders ED Discharge Orders     None         Cathren Laine, MD 01/22/22 2209

## 2022-02-19 ENCOUNTER — Ambulatory Visit (HOSPITAL_COMMUNITY)
Admission: EM | Admit: 2022-02-19 | Discharge: 2022-02-19 | Disposition: A | Discharge status: Home / Self Care | Payer: Medicaid Other | Attending: Emergency Medicine | Admitting: Emergency Medicine

## 2022-02-19 ENCOUNTER — Encounter (HOSPITAL_COMMUNITY): Payer: Self-pay

## 2022-02-19 DIAGNOSIS — J029 Acute pharyngitis, unspecified: Secondary | ICD-10-CM | POA: Diagnosis not present

## 2022-02-19 MED ORDER — FLUTICASONE PROPIONATE 50 MCG/ACT NA SUSP: 2.0000 | Freq: Every day | NASAL | 2 refills | Status: AC

## 2022-02-19 NOTE — ED Triage Notes (Signed)
Pt states sore throat,cough,runny nose for the past 10 days. ?

## 2022-02-19 NOTE — Discharge Instructions (Addendum)
Your throat pain may be due to postnasal drip.  Take over-the-counter antihistamines and continue throat spray since it helps.  There was no sign of throat infection todayYou will be called if your throat culture shows bacteria in the next couple of days. ?

## 2022-02-19 NOTE — ED Provider Notes (Signed)
?[place] ? ? ?MRN: [id number] DOB: 1961-08-11 ? ?Subjective:  ? ?Chief Complaint;  ?Chief Complaint  ?Patient presents with  ? Sore Throat  ? ?Pt states sore throat,cough,runny nose for the past 10 days ?Kathryn Mahoney is a 61 y.o. female presenting for sore throat for the past 10 days.  She has been using over-the-counter throat spray which is taking care of it she also states she was recently diagnosed with strep and was on antibiotics but wants to make sure it does not come back.  She denies fever, cough, chest pain or shortness of breath ? ?No current facility-administered medications for this encounter. ? ?Current Outpatient Medications:  ?  fluticasone (FLONASE) 50 MCG/ACT nasal spray, Place 2 sprays into both nostrils daily., Disp: 9.9 mL, Rfl: 2 ?  acetaminophen (TYLENOL) 500 MG tablet, Take 1,000 mg by mouth daily as needed for moderate pain or mild pain., Disp: , Rfl:  ?  albuterol (VENTOLIN HFA) 108 (90 Base) MCG/ACT inhaler, Inhale 2 puffs into the lungs every 4 (four) hours as needed for wheezing or shortness of breath., Disp: 1 each, Rfl: 1 ?  amLODipine (NORVASC) 10 MG tablet, Take 1 tablet (10 mg total) by mouth daily. (Patient not taking: Reported on 01/09/2022), Disp: 30 tablet, Rfl: 0 ?  amLODipine (NORVASC) 5 MG tablet, Take 5 mg by mouth daily., Disp: , Rfl:  ?  blood glucose meter kit and supplies KIT, Dispense based on patient and insurance preference. Use up to four times daily as directed., Disp: 1 each, Rfl: 0 ?  budesonide-formoterol (SYMBICORT) 160-4.5 MCG/ACT inhaler, Inhale 2 puffs into the lungs 2 (two) times daily., Disp: , Rfl:  ?  carvedilol (COREG) 6.25 MG tablet, Take 1 tablet (6.25 mg total) by mouth 2 (two) times daily with a meal. (Patient not taking: Reported on 01/09/2022), Disp: 60 tablet, Rfl: 0 ?  Cyanocobalamin (B-12 PO), Take 1 tablet by mouth daily., Disp: , Rfl:  ?  ergocalciferol (VITAMIN D2) 1.25 MG (50000 UT) capsule, Take 50,000 Units by mouth  once a week., Disp: , Rfl:  ?  ferrous sulfate 325 (65 FE) MG tablet, Take 1 tablet (325 mg total) by mouth daily with breakfast., Disp: 30 tablet, Rfl: 0 ?  furosemide (LASIX) 80 MG tablet, Take 1 tablet (80 mg total) by mouth 2 (two) times daily. (Patient not taking: Reported on 01/09/2022), Disp: 60 tablet, Rfl: 0 ?  glipiZIDE (GLUCOTROL XL) 5 MG 24 hr tablet, Take 1 tablet (5 mg total) by mouth every evening. (Patient not taking: Reported on 01/09/2022), Disp: 30 tablet, Rfl: 0 ?  glipiZIDE (GLUCOTROL) 5 MG tablet, Take 5 mg by mouth daily., Disp: , Rfl:  ?  guaiFENesin (MUCINEX) 600 MG 12 hr tablet, Take 600 mg by mouth 2 (two) times daily as needed for to loosen phlegm., Disp: , Rfl:  ?  hydrocortisone cream 1 %, Apply 1 application. topically 2 (two) times daily as needed for itching., Disp: , Rfl:  ?  meloxicam (MOBIC) 15 MG tablet, Take 15 mg by mouth daily., Disp: , Rfl:  ?  polyethylene glycol (MIRALAX / GLYCOLAX) 17 g packet, Take 17 g by mouth daily. (Patient not taking: Reported on 01/09/2022), Disp: 14 each, Rfl: 0 ?  predniSONE (DELTASONE) 20 MG tablet, Take 3 tablets (60 mg total) by mouth daily., Disp: 12 tablet, Rfl: 0 ?  sacubitril-valsartan (ENTRESTO) 24-26 MG, Take 1 tablet by mouth 2 (two) times daily. (Patient not taking: Reported on 01/09/2022),  Disp: 60 tablet, Rfl: 0 ?  senna-docusate (SENOKOT-S) 8.6-50 MG tablet, Take 1 tablet by mouth at bedtime. (Patient not taking: Reported on 10/23/2021), Disp: 30 tablet, Rfl: 0 ?  torsemide (DEMADEX) 20 MG tablet, Take 3 tablets (60 mg total) by mouth 2 (two) times daily., Disp: 180 tablet, Rfl: 0 ?  VENTOLIN HFA 108 (90 Base) MCG/ACT inhaler, Inhale 2 puffs into the lungs 3 (three) times daily as needed for wheezing, shortness of breath or wheezing., Disp: , Rfl:   ? ?Allergies  ?Allergen Reactions  ? Contrast Media [Iodinated Contrast Media] Other (See Comments)  ?  Coughing, sick to stomach, increase blood pressure   ? Empagliflozin Other (See  Comments)  ?  Yeast infection  ? Losartan Other (See Comments)  ?  Back pain  ? Torsemide Hives and Itching  ?  Pt takes this medication because nothing else works  ? Lisinopril Nausea And Vomiting  ? Metformin Palpitations  ? Ondansetron Swelling and Other (See Comments)  ? ? ?Past Medical History:  ?Diagnosis Date  ? Asthma   ? B12 deficiency   ? CHF (congestive heart failure) ([place])   ? COPD (chronic obstructive pulmonary disease) ([place])   ? Diabetes mellitus without complication ([place])   ? Hypertension   ? Morbid obesity ([place])   ? SOB (shortness of breath)   ? Urinary incontinence   ?  ? ?Review of Systems  ?All other systems reviewed and are negative. ? ? ?Objective:  ? ?Vitals: ?BP (!) 159/98 (BP Location: Right Arm)   Pulse (!) 105   Temp 99 ?F (37.2 ?C) (Oral)   Resp 18   SpO2 93%  ? ?Physical Exam ?Vitals and nursing note reviewed.  ?Constitutional:   ?   General: She is not in acute distress. ?   Appearance: She is well-developed.  ?HENT:  ?   Head: Normocephalic and atraumatic.  ?   Right Ear: Tympanic membrane and ear canal normal.  ?   Left Ear: Tympanic membrane and ear canal normal.  ?   Mouth/Throat:  ?   Pharynx: Posterior oropharyngeal erythema present. No pharyngeal swelling, oropharyngeal exudate or uvula swelling.  ?Eyes:  ?   Conjunctiva/sclera: Conjunctivae normal.  ?Cardiovascular:  ?   Rate and Rhythm: Normal rate and regular rhythm.  ?   Heart sounds: No murmur heard. ?Pulmonary:  ?   Effort: Pulmonary effort is normal. No respiratory distress.  ?   Breath sounds: Normal breath sounds.  ?Abdominal:  ?   Palpations: Abdomen is soft.  ?   Tenderness: There is no abdominal tenderness.  ?Musculoskeletal:     ?   General: No swelling.  ?   Cervical back: Neck supple.  ?Skin: ?   General: Skin is warm and dry.  ?   Capillary Refill: Capillary refill takes less than 2 seconds.  ?Neurological:  ?   Mental Status: She is alert.  ?Psychiatric:     ?   Mood and Affect: Mood normal.  ?   Comments: Pt  seems angry  ? ? ?No results found for this or any previous visit (from the past 24 hour(s)). ? ?No results found.  ?  ? ?Assessment and Plan :  ? ?1. Sore throat   ? ? ?Meds ordered this encounter  ?Medications  ? fluticasone (FLONASE) 50 MCG/ACT nasal spray  ?  Sig: Place 2 sprays into both nostrils daily.  ?  Dispense:  9.9 mL  ?  Refill:  2  ? ? ?  MDM:  ?Kathryn Mahoney is a 61 y.o. female presenting for sore throat for the last 10 days and has been using throat stat spray however her physical exam is unremarkable except for mild tachycardia at 105 and elevated blood pressure 159/98.  Patient was adamant she wanted antibiotics today.   Throat culture is pending.  She is encouraged to drink plenty of fluids and return if worse or new symptoms for reevaluation.  Patient had initially requested to strep and then recanted.  She just wanted her paperwork to go home.  I suggested she try over-the-counter antihistamines as well as nasal steroid for the throat pain and continue her throat spray.  I discussed treatment, follow up and return instructions. Questions were answered. Patient/representative stated understanding of instructions and patient is stable for discharge. ?Leida Lauth FNP-C MCN  ?  ?Hezzie Bump, NP ?02/19/22 1545 ? ?

## 2022-02-19 NOTE — ED Notes (Signed)
Pt refused strep test. Provider notified.  ?

## 2022-03-30 ENCOUNTER — Emergency Department (HOSPITAL_COMMUNITY)
Admission: EM | Admit: 2022-03-30 | Discharge: 2022-03-30 | Disposition: A | Discharge status: Home / Self Care | Payer: Medicaid Other | Attending: Emergency Medicine | Admitting: Emergency Medicine

## 2022-03-30 ENCOUNTER — Encounter (HOSPITAL_COMMUNITY): Payer: Self-pay

## 2022-03-30 ENCOUNTER — Other Ambulatory Visit: Payer: Self-pay

## 2022-03-30 ENCOUNTER — Emergency Department (HOSPITAL_COMMUNITY): Payer: Medicaid Other

## 2022-03-30 DIAGNOSIS — I509 Heart failure, unspecified: Secondary | ICD-10-CM | POA: Insufficient documentation

## 2022-03-30 DIAGNOSIS — Z79899 Other long term (current) drug therapy: Secondary | ICD-10-CM | POA: Diagnosis not present

## 2022-03-30 DIAGNOSIS — D649 Anemia, unspecified: Secondary | ICD-10-CM | POA: Diagnosis not present

## 2022-03-30 DIAGNOSIS — J45909 Unspecified asthma, uncomplicated: Secondary | ICD-10-CM | POA: Insufficient documentation

## 2022-03-30 DIAGNOSIS — I110 Hypertensive heart disease with heart failure: Secondary | ICD-10-CM | POA: Insufficient documentation

## 2022-03-30 DIAGNOSIS — R0602 Shortness of breath: Secondary | ICD-10-CM | POA: Diagnosis present

## 2022-03-30 DIAGNOSIS — R600 Localized edema: Secondary | ICD-10-CM | POA: Insufficient documentation

## 2022-03-30 DIAGNOSIS — J449 Chronic obstructive pulmonary disease, unspecified: Secondary | ICD-10-CM | POA: Diagnosis not present

## 2022-03-30 DIAGNOSIS — E1165 Type 2 diabetes mellitus with hyperglycemia: Secondary | ICD-10-CM | POA: Diagnosis not present

## 2022-03-30 DIAGNOSIS — Z7984 Long term (current) use of oral hypoglycemic drugs: Secondary | ICD-10-CM | POA: Diagnosis not present

## 2022-03-30 DIAGNOSIS — Z7951 Long term (current) use of inhaled steroids: Secondary | ICD-10-CM | POA: Insufficient documentation

## 2022-03-30 DIAGNOSIS — E876 Hypokalemia: Secondary | ICD-10-CM | POA: Insufficient documentation

## 2022-03-30 LAB — CBC WITH DIFFERENTIAL/PLATELET
Abs Immature Granulocytes: 0.03 10*3/uL (ref 0.00–0.07)
Basophils Absolute: 0 10*3/uL (ref 0.0–0.1)
Basophils Relative: 0 %
Eosinophils Absolute: 0.3 10*3/uL (ref 0.0–0.5)
Eosinophils Relative: 3 %
HCT: 39.9 % (ref 36.0–46.0)
Hemoglobin: 11.4 g/dL — ABNORMAL LOW (ref 12.0–15.0)
Immature Granulocytes: 0 %
Lymphocytes Relative: 28 %
Lymphs Abs: 2.9 10*3/uL (ref 0.7–4.0)
MCH: 24.6 pg — ABNORMAL LOW (ref 26.0–34.0)
MCHC: 28.6 g/dL — ABNORMAL LOW (ref 30.0–36.0)
MCV: 86 fL (ref 80.0–100.0)
Monocytes Absolute: 0.6 10*3/uL (ref 0.1–1.0)
Monocytes Relative: 6 %
Neutro Abs: 6.4 10*3/uL (ref 1.7–7.7)
Neutrophils Relative %: 63 %
Platelets: 465 10*3/uL — ABNORMAL HIGH (ref 150–400)
RBC: 4.64 MIL/uL (ref 3.87–5.11)
RDW: 17.2 % — ABNORMAL HIGH (ref 11.5–15.5)
WBC: 10.3 10*3/uL (ref 4.0–10.5)
nRBC: 0 % (ref 0.0–0.2)

## 2022-03-30 LAB — BASIC METABOLIC PANEL
Anion gap: 10 (ref 5–15)
BUN: 15 mg/dL (ref 8–23)
CO2: 35 mmol/L — ABNORMAL HIGH (ref 22–32)
Calcium: 8.9 mg/dL (ref 8.9–10.3)
Chloride: 93 mmol/L — ABNORMAL LOW (ref 98–111)
Creatinine, Ser: 0.98 mg/dL (ref 0.44–1.00)
GFR, Estimated: >60 mL/min (ref >60)
Glucose, Bld: 464 mg/dL — ABNORMAL HIGH (ref 70–99)
Potassium: 3.4 mmol/L — ABNORMAL LOW (ref 3.5–5.1)
Sodium: 138 mmol/L (ref 135–145)

## 2022-03-30 LAB — TROPONIN I (HIGH SENSITIVITY): Troponin I (High Sensitivity): 11 ng/L (ref <18)

## 2022-03-30 LAB — BRAIN NATRIURETIC PEPTIDE: B Natriuretic Peptide: 32.1 pg/mL (ref 0.0–100.0)

## 2022-03-30 MED ORDER — ALBUTEROL SULFATE HFA 108 (90 BASE) MCG/ACT IN AERS
2.0000 | INHALATION_SPRAY | Freq: Once | RESPIRATORY_TRACT | Status: AC
  Administered 2022-03-30: 2 via RESPIRATORY_TRACT
  Filled 2022-03-30: qty 6.7

## 2022-03-30 MED ORDER — INSULIN ASPART 100 UNIT/ML IJ SOLN
5.0000 [IU] | Freq: Once | INTRAMUSCULAR | Status: AC
  Administered 2022-03-30: 5 [IU] via SUBCUTANEOUS
  Filled 2022-03-30: qty 0.05

## 2022-03-30 NOTE — ED Triage Notes (Signed)
Patient reports SOB x 2-3 days. Patient states that she has a history of COPD and CHF.

## 2022-03-30 NOTE — ED Provider Notes (Signed)
[facility] DEPT Provider Note   CSN: [id number] Arrival date & time: 03/30/22  1043     History  Chief Complaint  Patient presents with   Shortness of Breath    Kathryn Mahoney is a 61 y.o. female.  61 year old female with past medical history of CHF, COPD presents today for evaluation of not feeling well overall.  States that over the past several months she has had shortness of breath increased from her baseline.  She has been evaluated in the emergency room multiple times for similar complaint.  Has followed up with her cardiologist as well.  She is worried that she may be in CHF exacerbation.  She is also asking if she can receive an injection to help with her energy levels.  Endorses fluttering sensation in her chest otherwise denies chest tightness, pleuritic chest pain, or chest pressure.  Endorses medication compliance.  Does not watch her diet.  Denies recent travel.  Denies increased and home O2 requirement.  Wears 2 L baseline supplemental O2.  Denies orthopnea, PND.  The history is provided by the patient. No language interpreter was used.      Home Medications Prior to Admission medications   Medication Sig Start Date End Date Taking? Authorizing Provider  acetaminophen (TYLENOL) 500 MG tablet Take 1,000 mg by mouth daily as needed for moderate pain or mild pain.    [provider]  albuterol (VENTOLIN HFA) 108 (90 Base) MCG/ACT inhaler Inhale 2 puffs into the lungs every 4 (four) hours as needed for wheezing or shortness of breath. 01/22/22   Lajean Saver, MD  amLODipine (NORVASC) 5 MG tablet Take 5 mg by mouth daily. 01/07/22   [provider]  blood glucose meter kit and supplies KIT Dispense based on patient and insurance preference. Use up to four times daily as directed. 07/25/21   Florencia Reasons, MD  budesonide-formoterol [facility]) 160-4.5 MCG/ACT inhaler Inhale 2 puffs into the lungs 2 (two) times daily. 01/07/22   [provider]  carvedilol (COREG) 6.25 MG tablet Take 1 tablet (6.25 mg total) by mouth 2 (two) times daily with a meal. Patient not taking: Reported on 01/09/2022 10/24/21   Debbe Odea, MD  Cyanocobalamin (B-12 PO) Take 1 tablet by mouth daily.    [provider]  ergocalciferol (VITAMIN D2) 1.25 MG (50000 UT) capsule Take 50,000 Units by mouth once a week.    [provider]  ferrous sulfate 325 (65 FE) MG tablet Take 1 tablet (325 mg total) by mouth daily with breakfast. 07/26/21   Florencia Reasons, MD  fluticasone [facility]) 50 MCG/ACT nasal spray Place 2 sprays into both nostrils daily. 02/19/22 03/21/22  Hezzie Bump, NP  glipiZIDE (GLUCOTROL) 5 MG tablet Take 5 mg by mouth daily. 01/07/22   [provider]  guaiFENesin (MUCINEX) 600 MG 12 hr tablet Take 600 mg by mouth 2 (two) times daily as needed for to loosen phlegm.    [provider]  hydrocortisone cream 1 % Apply 1 application. topically 2 (two) times daily as needed for itching.    [provider]  meloxicam (MOBIC) 15 MG tablet Take 15 mg by mouth daily. 12/31/21   [provider]  polyethylene glycol (MIRALAX / GLYCOLAX) 17 g packet Take 17 g by mouth daily. Patient not taking: Reported on 01/09/2022 07/26/21   Florencia Reasons, MD  predniSONE (DELTASONE) 20 MG tablet Take 3 tablets (60 mg total) by mouth daily. 01/23/22   Lajean Saver, MD  sacubitril-valsartan (ENTRESTO) 24-26 MG Take 1 tablet by mouth 2 (two) times daily. Patient not taking: Reported on 01/09/2022 10/24/21   Debbe Odea, MD  senna-docusate (SENOKOT-S) 8.6-50 MG tablet Take 1 tablet by mouth at bedtime. Patient not taking: Reported on 10/23/2021 07/25/21   Florencia Reasons, MD  torsemide (DEMADEX) 20 MG tablet Take 3 tablets (60 mg total) by mouth 2 (two) times daily. 01/09/22 02/08/22  Wyvonnia Dusky, MD      Allergies    Contrast media [iodinated contrast media], Empagliflozin, Losartan, Torsemide, Lisinopril, Metformin, and  Ondansetron    Review of Systems   Review of Systems  Constitutional:  Negative for chills and fever.  Respiratory:  Positive for shortness of breath.   Cardiovascular:  Positive for leg swelling. Negative for chest pain and palpitations.  Gastrointestinal:  Positive for abdominal distention. Negative for abdominal pain, nausea and vomiting.  Genitourinary:  Negative for decreased urine volume and difficulty urinating.  Neurological:  Negative for light-headedness.  All other systems reviewed and are negative.  Physical Exam Updated Vital Signs BP 108/74   Pulse (!) 103   Temp 98 F (36.7 C) (Oral)   Resp 17   Ht '5\' 9"'  (1.753 m)   Wt 131.5 kg   SpO2 98%   BMI 42.83 kg/m  Physical Exam Vitals and nursing note reviewed.  Constitutional:      General: She is not in acute distress.    Appearance: Normal appearance. She is not ill-appearing.  HENT:     Head: Normocephalic and atraumatic.     Nose: Nose normal.  Eyes:     General: No scleral icterus.    Extraocular Movements: Extraocular movements intact.     Conjunctiva/sclera: Conjunctivae normal.  Cardiovascular:     Rate and Rhythm: Normal rate and regular rhythm.     Pulses: Normal pulses.  Pulmonary:     Effort: Pulmonary effort is normal. No respiratory distress.     Breath sounds: Normal breath sounds. No wheezing or rales.  Abdominal:     General: There is no distension.     Palpations: Abdomen is soft.     Tenderness: There is no abdominal tenderness. There is no guarding or rebound.  Musculoskeletal:        General: Normal range of motion.     Cervical back: Normal range of motion.     Right lower leg: Edema (Trace pitting edema) present.     Left lower leg: Edema (Trace pitting edema) present.  Skin:    General: Skin is warm and dry.  Neurological:     General: No focal deficit present.     Mental Status: She is alert. Mental status is at baseline.    ED Results / Procedures / Treatments   Labs (all  labs ordered are listed, but only abnormal results are displayed) Labs Reviewed  BASIC METABOLIC PANEL - Abnormal; Notable for the following components:      Result Value   Potassium 3.4 (*)    Chloride 93 (*)    CO2 35 (*)    Glucose, Bld 464 (*)    All other components within normal limits  CBC WITH DIFFERENTIAL/PLATELET - Abnormal; Notable for the following components:   Hemoglobin 11.4 (*)    MCH 24.6 (*)    MCHC 28.6 (*)    RDW 17.2 (*)    Platelets 465 (*)    All other components within normal limits  BRAIN NATRIURETIC PEPTIDE  TROPONIN I (HIGH SENSITIVITY)  TROPONIN I (HIGH SENSITIVITY)    EKG None  Radiology DG Chest 2 View  Result Date: 03/30/2022 CLINICAL DATA:  61 year old female with history of shortness of breath for the past 2-3 days. EXAM: CHEST - 2 VIEW COMPARISON:  Chest x-ray 01/25/2022. FINDINGS: Lung volumes are normal. No consolidative airspace disease. No pleural effusions. No definite suspicious appearing pulmonary nodules or masses are noted. Cephalization of the pulmonary vasculature, without frank pulmonary edema. Heart size is mildly enlarged. Upper mediastinal contours are within normal limits. Atherosclerotic calcifications are noted in the thoracic aorta. IMPRESSION: 1. Cardiomegaly with pulmonary venous congestion, but no frank pulmonary edema. 2. Aortic atherosclerosis. Electronically Signed   By: Vinnie Langton M.D.   On: 03/30/2022 11:38    Procedures Procedures    Medications Ordered in ED Medications - No data to display  ED Course/ Medical Decision Making/ A&P                           Medical Decision Making Amount and/or Complexity of Data Reviewed Radiology: ordered.   Medical Decision Making / ED Course   This patient presents to the ED for concern of shortness of breath, this involves an extensive number of treatment options, and is a complaint that carries with it a high risk of complications and morbidity.  The differential  diagnosis includes CHF, PE, pneumonia, URI, COPD exacerbation  MDM: 61 year old female presents today for evaluation of shortness of breath.  Symptoms have been ongoing over the past several months however she states they have been worse over the past 2 weeks.  Has history of CHF, COPD.  Wears baseline 2 L supplemental O2 at home.  Denies increase in O2 requirement.  Patient is without conversational dyspnea, orthopnea, significant peripheral edema, or abdominal distention on exam.  Chest x-ray is without pulmonary edema, or other acute cardiopulmonary process.  Initial troponin of 11 which is around patient's baseline and she is without chest pain and her symptoms have been ongoing for couple weeks at this point doubt ACS.  Doubt acute CHF exacerbation given negative chest x-ray, normal BNP, and without signs of volume overload on exam.  CBC with mild anemia at 11.4 which is around her baseline, without leukocytosis.  BMP with potassium of 3.4 and elevated glucose otherwise without acute concerns.  BNP within normal limits.  Patient remained tachycardic between 100-115.  Per chart review patient appears to be chronically tachycardic.  We discussed all doing a CTA chest to evaluate for PE however patient states her issues have been going on for several months and she would not like to have PE study done today given she has not had acute worsening of.  Return precautions discussed with patient at length.  Patient to continue her torsemide and other home medications and follow-up with PCP and cardiology for further work-up and management.  Blood sugar elevated.  5 units of insulin provided prior to discharge.  No evidence of DKA.  Without anion gap.   Lab Tests: -I ordered, reviewed, and interpreted labs.   The pertinent results include:   Labs Reviewed  BASIC METABOLIC PANEL - Abnormal; Notable for the following components:      Result Value   Potassium 3.4 (*)    Chloride 93 (*)    CO2 35 (*)     Glucose, Bld 464 (*)    All other components within normal limits  CBC WITH DIFFERENTIAL/PLATELET - Abnormal; Notable for the following components:  Hemoglobin 11.4 (*)    MCH 24.6 (*)    MCHC 28.6 (*)    RDW 17.2 (*)    Platelets 465 (*)    All other components within normal limits  BRAIN NATRIURETIC PEPTIDE  TROPONIN I (HIGH SENSITIVITY)  TROPONIN I (HIGH SENSITIVITY)      EKG  EKG Interpretation  Date/Time:    Ventricular Rate:    PR Interval:    QRS Duration:   QT Interval:    QTC Calculation:   R Axis:     Text Interpretation:           Imaging Studies ordered: I ordered imaging studies including chest x-ray I independently visualized and interpreted imaging. I agree with the radiologist interpretation   Medicines ordered and prescription drug management: Meds ordered this encounter  Medications   albuterol (VENTOLIN HFA) 108 (90 Base) MCG/ACT inhaler 2 puff   insulin aspart (novoLOG) injection 5 Units    -I have reviewed the patients home medicines and have made adjustments as needed  Reevaluation: After the interventions noted above, I reevaluated the patient and found that they have :stayed the same  Co morbidities that complicate the patient evaluation  Past Medical History:  Diagnosis Date   Asthma    B12 deficiency    CHF (congestive heart failure) (HCC)    COPD (chronic obstructive pulmonary disease) ([place])    Diabetes mellitus without complication ([place])    Hypertension    Morbid obesity ([place])    SOB (shortness of breath)    Urinary incontinence       Dispostion: Patient is appropriate for discharge.  Discharged in stable condition.  Return precautions discussed.  Final Clinical Impression(s) / ED Diagnoses Final diagnoses:  Shortness of breath    Rx / DC Orders ED Discharge Orders     None         Evlyn Courier, PA-C 03/30/22 1349    Lennice Sites, DO 03/30/22 1434

## 2022-03-30 NOTE — Discharge Instructions (Addendum)
Your work-up today was reassuring.  It was not concerning for an acute CHF exacerbation or COPD exacerbation.  We discussed your heart rate which she states is always elevated.  If you have any worsening symptoms return to the emergency room otherwise I recommend you follow-up with your primary care provider and cardiologist.  Continue taking your medications as prescribed.  Watch your fluid intake and limit sodium as much as possible.

## 2022-03-30 NOTE — ED Provider Triage Note (Signed)
Emergency Medicine Provider Triage Evaluation Note  Kathryn Mahoney , a 61 y.o. female  was evaluated in triage.  Pt complains of shortness of breath, chest pain for the last 2 weeks.  She does have a history of CHF and COPD.  States that this feels like a flareup.  Patient also complaining of lower extremity swelling.  No abdominal pain, nausea, pain, diarrhea, fever, chills.  She is on 2 L of oxygen at home.  Review of Systems  Positive:  Negative: See above  Physical Exam  BP 107/78 (BP Location: Left Arm)   Pulse (!) 108   Temp 98 F (36.7 C) (Oral)   Resp 18   Ht '5\' 9"'$  (1.753 m)   Wt 131.5 kg   SpO2 (!) 89% Comment: Patient placed on 2L/min and sats increased to 91%. O2 increased to 3L/min via [place] and sats increased to 93%.  BMI 42.83 kg/m  Gen:   Awake, no distress   Resp:  Normal effort  MSK:   Moves extremities without difficulty  Other:    Medical Decision Making  Medically screening exam initiated at 11:21 AM.  Appropriate orders placed.  Kathryn Mahoney was informed that the remainder of the evaluation will be completed by another provider, this initial triage assessment does not replace that evaluation, and the importance of remaining in the ED until their evaluation is complete.     Myna Bright [place], [place] 03/30/22 1122

## 2022-10-29 ENCOUNTER — Other Ambulatory Visit: Payer: Self-pay

## 2022-10-29 ENCOUNTER — Emergency Department (HOSPITAL_COMMUNITY)
Admission: EM | Admit: 2022-10-29 | Discharge: 2022-10-30 | Disposition: A | Discharge status: Home / Self Care | Payer: Medicaid Other | Attending: Emergency Medicine | Admitting: Emergency Medicine

## 2022-10-29 ENCOUNTER — Emergency Department (HOSPITAL_COMMUNITY): Payer: Medicaid Other

## 2022-10-29 DIAGNOSIS — I509 Heart failure, unspecified: Secondary | ICD-10-CM | POA: Diagnosis not present

## 2022-10-29 DIAGNOSIS — Z87891 Personal history of nicotine dependence: Secondary | ICD-10-CM | POA: Diagnosis not present

## 2022-10-29 DIAGNOSIS — J449 Chronic obstructive pulmonary disease, unspecified: Secondary | ICD-10-CM | POA: Diagnosis not present

## 2022-10-29 DIAGNOSIS — U071 COVID-19: Secondary | ICD-10-CM | POA: Diagnosis not present

## 2022-10-29 DIAGNOSIS — E119 Type 2 diabetes mellitus without complications: Secondary | ICD-10-CM | POA: Insufficient documentation

## 2022-10-29 DIAGNOSIS — R059 Cough, unspecified: Secondary | ICD-10-CM | POA: Diagnosis present

## 2022-10-29 DIAGNOSIS — I110 Hypertensive heart disease with heart failure: Secondary | ICD-10-CM | POA: Diagnosis not present

## 2022-10-29 LAB — CBC WITH DIFFERENTIAL/PLATELET
Abs Immature Granulocytes: 0.04 10*3/uL (ref 0.00–0.07)
Basophils Absolute: 0 10*3/uL (ref 0.0–0.1)
Basophils Relative: 0 %
Eosinophils Absolute: 0.4 10*3/uL (ref 0.0–0.5)
Eosinophils Relative: 3 %
HCT: 39.1 % (ref 36.0–46.0)
Hemoglobin: 11.3 g/dL — ABNORMAL LOW (ref 12.0–15.0)
Immature Granulocytes: 0 %
Lymphocytes Relative: 25 %
Lymphs Abs: 3 10*3/uL (ref 0.7–4.0)
MCH: 23.7 pg — ABNORMAL LOW (ref 26.0–34.0)
MCHC: 28.9 g/dL — ABNORMAL LOW (ref 30.0–36.0)
MCV: 82 fL (ref 80.0–100.0)
Monocytes Absolute: 0.6 10*3/uL (ref 0.1–1.0)
Monocytes Relative: 5 %
Neutro Abs: 7.9 10*3/uL — ABNORMAL HIGH (ref 1.7–7.7)
Neutrophils Relative %: 67 %
Platelets: 514 10*3/uL — ABNORMAL HIGH (ref 150–400)
RBC: 4.77 MIL/uL (ref 3.87–5.11)
RDW: 18.9 % — ABNORMAL HIGH (ref 11.5–15.5)
WBC: 11.9 10*3/uL — ABNORMAL HIGH (ref 4.0–10.5)
nRBC: 0 % (ref 0.0–0.2)

## 2022-10-29 LAB — BASIC METABOLIC PANEL
Anion gap: 6 (ref 5–15)
BUN: 15 mg/dL (ref 8–23)
CO2: 31 mmol/L (ref 22–32)
Calcium: 9.1 mg/dL (ref 8.9–10.3)
Chloride: 102 mmol/L (ref 98–111)
Creatinine, Ser: 0.77 mg/dL (ref 0.44–1.00)
GFR, Estimated: >60 mL/min (ref >60)
Glucose, Bld: 173 mg/dL — ABNORMAL HIGH (ref 70–99)
Potassium: 4.1 mmol/L (ref 3.5–5.1)
Sodium: 139 mmol/L (ref 135–145)

## 2022-10-29 LAB — RESP PANEL BY RT-PCR (RSV, FLU A&B, COVID)  RVPGX2
Influenza A by PCR: NEGATIVE
Influenza B by PCR: NEGATIVE
Resp Syncytial Virus by PCR: NEGATIVE
SARS Coronavirus 2 by RT PCR: POSITIVE — AB

## 2022-10-29 LAB — BRAIN NATRIURETIC PEPTIDE: B Natriuretic Peptide: 48.5 pg/mL (ref 0.0–100.0)

## 2022-10-29 MED ORDER — KETOROLAC TROMETHAMINE 15 MG/ML IJ SOLN
30.0000 mg | Freq: Once | INTRAMUSCULAR | Status: AC
  Administered 2022-10-29: 30 mg via INTRAMUSCULAR
  Filled 2022-10-29: qty 2

## 2022-10-29 MED ORDER — MOLNUPIRAVIR EUA 200MG CAPSULE: 4.0000 | ORAL_CAPSULE | Freq: Two times a day (BID) | ORAL | 0 refills | Status: AC

## 2022-10-29 MED ORDER — BENZONATATE 100 MG PO CAPS: 100.0000 mg | ORAL_CAPSULE | Freq: Three times a day (TID) | ORAL | 0 refills | Status: DC | PRN

## 2022-10-29 MED ORDER — IBUPROFEN 800 MG PO TABS
800.0000 mg | ORAL_TABLET | Freq: Once | ORAL | Status: AC
  Administered 2022-10-29: 800 mg via ORAL
  Filled 2022-10-29: qty 1

## 2022-10-29 MED ORDER — NAPROXEN 500 MG PO TABS: 500.0000 mg | ORAL_TABLET | Freq: Two times a day (BID) | ORAL | 0 refills | Status: DC

## 2022-10-29 NOTE — ED Provider Notes (Signed)
[facility] Emergency Department Provider Note MRN:  [id number]  Arrival date & time: 10/29/22     Chief Complaint   Cough   History of Present Illness   Kathryn Mahoney is a 62 y.o. year-old female with a history of CHF, COPD presenting to the ED with chief complaint of cough.  Cough, body aches, malaise, fatigue, sore throat, dull headaches, symptoms for about 3 days.  Review of Systems  A thorough review of systems was obtained and all systems are negative except as noted in the HPI and PMH.   Patient's Health History    Past Medical History:  Diagnosis Date   Asthma    B12 deficiency    CHF (congestive heart failure) (HCC)    COPD (chronic obstructive pulmonary disease) (HCC)    Diabetes mellitus without complication (HCC)    Hypertension    Morbid obesity (HCC)    SOB (shortness of breath)    Urinary incontinence     Past Surgical History:  Procedure Laterality Date   CESAREAN SECTION     ECTOPIC PREGNANCY SURGERY     KNEE SURGERY      Family History  Problem Relation Age of Onset   Stroke Mother    Asthma Mother     Social History   Socioeconomic History   Marital status: Divorced    Spouse name: Not on file   Number of children: 4   Years of education: Not on file   Highest education level: Not on file  Occupational History   Not on file  Tobacco Use   Smoking status: Former    Packs/day: 1.50    Years: 30.00    Total pack years: 45.00    Types: Cigarettes    Quit date: 10/27/2010    Years since quitting: 12.0   Smokeless tobacco: Never  Vaping Use   Vaping Use: Never used  Substance and Sexual Activity   Alcohol use: No   Drug use: No   Sexual activity: Not Currently  Other Topics Concern   Not on file  Social History Narrative   Not on file   Social Determinants of Health   Financial Resource Strain: Not on file  Food Insecurity: Not on file  Transportation Needs: Not on file  Physical Activity: Not on file   Stress: Not on file  Social Connections: Not on file  Intimate Partner Violence: Not on file     Physical Exam   Vitals:   10/29/22 1539 10/29/22 1951  BP: 128/84 (!) 146/88  Pulse: (!) 101 (!) 109  Resp: 18 17  Temp: 98.3 F (36.8 C) 98.8 F (37.1 C)  SpO2: 90% 95%    CONSTITUTIONAL: Well-appearing, NAD NEURO/PSYCH:  Alert and oriented x 3, no focal deficits EYES:  eyes equal and reactive ENT/NECK:  no LAD, no JVD CARDIO: Regular rate, well-perfused, normal S1 and S2 PULM:  CTAB no wheezing or rhonchi GI/GU:  non-distended, non-tender MSK/SPINE:  No gross deformities, no edema SKIN:  no rash, atraumatic   *Additional and/or pertinent findings included in MDM below  Diagnostic and Interventional Summary    EKG Interpretation  Date/Time:    Ventricular Rate:    PR Interval:    QRS Duration:   QT Interval:    QTC Calculation:   R Axis:     Text Interpretation:         Labs Reviewed  RESP PANEL BY RT-PCR (RSV, FLU A&B, COVID)  RVPGX2 - Abnormal; Notable for  the following components:      Result Value   SARS Coronavirus 2 by RT PCR POSITIVE (*)    All other components within normal limits  CBC WITH DIFFERENTIAL/PLATELET - Abnormal; Notable for the following components:   WBC 11.9 (*)    Hemoglobin 11.3 (*)    MCH 23.7 (*)    MCHC 28.9 (*)    RDW 18.9 (*)    Platelets 514 (*)    Neutro Abs 7.9 (*)    All other components within normal limits  BASIC METABOLIC PANEL - Abnormal; Notable for the following components:   Glucose, Bld 173 (*)    All other components within normal limits  BRAIN NATRIURETIC PEPTIDE    DG Chest 2 View  Final Result      Medications  ibuprofen (ADVIL) tablet 800 mg (800 mg Oral Given 10/29/22 1336)  ketorolac (TORADOL) 15 MG/ML injection 30 mg (30 mg Intramuscular Given 10/29/22 2344)     Procedures  /  Critical Care Procedures  ED Course and Medical Decision Making  Initial Impression and Ddx Symptoms seem quite  consistent with viral illness, and patient has tested positive for COVID-19.  Denies shortness of breath or chest pain.  Had some tachycardia during her extended wait time but not on my exam.  Labs and chest x-ray reassuring, no significant blood count or electrolyte disturbance, chest x-ray clear.  Patient is appropriate for discharge with symptomatic management.  Past medical/surgical history that increases complexity of ED encounter: CHF  Interpretation of Diagnostics I personally reviewed the Chest Xray and my interpretation is as follows: No pneumothorax or lobar opacity or edema    Patient Reassessment and Ultimate Disposition/Management     Discharge  Patient management required discussion with the following services or consulting groups:  None  Complexity of Problems Addressed Acute illness or injury that poses threat of life of bodily function  Additional Data Reviewed and Analyzed Further history obtained from: Prior labs/imaging results  Additional Factors Impacting ED Encounter Risk Prescriptions  Barth Kirks. Sedonia Small, MD [place] [email or web address]  Final Clinical Impressions(s) / ED Diagnoses     ICD-10-CM   1. COVID-19  U07.1       ED Discharge Orders          Ordered    molnupiravir EUA (LAGEVRIO) 200 mg CAPS capsule  2 times daily        10/29/22 2350    benzonatate (TESSALON) 100 MG capsule  3 times daily PRN        10/29/22 2350    naproxen (NAPROSYN) 500 MG tablet  2 times daily        10/29/22 2350             Discharge Instructions Discussed with and Provided to Patient:    Discharge Instructions      You were evaluated in the Emergency Department and after careful evaluation, we did not find any emergent condition requiring admission or further testing in the hospital.  Your exam/testing today is overall reassuring.  Symptoms seem to be due to COVID-19.  You tested positive here in the  emergency department.  Use the molnupiravir medication as prescribed to help get better faster.  Use the Tessalon medication as needed for cough.  Use the Naprosyn medication as needed for aches or discomfort.  Please return to the Emergency Department if you experience any worsening of your condition.   Thank you for allowing [place] to  be a part of your care.      Maudie Flakes, MD 10/29/22 [phone]

## 2022-10-29 NOTE — ED Triage Notes (Signed)
Pt c/o sore throat, cough, body aches. Swelling to lower legs hx chf

## 2022-10-29 NOTE — ED Provider Triage Note (Signed)
Emergency Medicine Provider Triage Evaluation Note  Kathryn Mahoney , a 62 y.o. female  was evaluated in triage.  Pt complains of bodyaches, subjective fevers, chills, sore throat, Etc.  Says that she has been around coughing kids  Review of Systems  Positive:  Negative:   Physical Exam  There were no vitals taken for this visit. Gen:   Awake, no distress   Resp:  Normal effort  MSK:   Moves extremities without difficulty  Other:  No appreciable peripheral edema, oxygen saturations in the upper 80s, reports that she has oxygen at home for "when she needs it."  Satting 92% on room air which I suspect is somewhat of her baseline secondary to COPD/CHF  Medical Decision Making  Medically screening exam initiated at 1:31 PM.  Appropriate orders placed.  Jessalyn Hinojosa was informed that the remainder of the evaluation will be completed by another provider, this initial triage assessment does not replace that evaluation, and the importance of remaining in the ED until their evaluation is complete.   Borderline fever and bodyaches.  Given ibuprofen.  Oxygen saturations in the low 90s, likely chronic.   Rhae Hammock, PA-C 10/29/22 1332

## 2022-10-29 NOTE — Discharge Instructions (Signed)
You were evaluated in the Emergency Department and after careful evaluation, we did not find any emergent condition requiring admission or further testing in the hospital.  Your exam/testing today is overall reassuring.  Symptoms seem to be due to COVID-19.  You tested positive here in the emergency department.  Use the molnupiravir medication as prescribed to help get better faster.  Use the Tessalon medication as needed for cough.  Use the Naprosyn medication as needed for aches or discomfort.  Please return to the Emergency Department if you experience any worsening of your condition.   Thank you for allowing [place] to be a part of your care.

## 2023-07-06 ENCOUNTER — Encounter (HOSPITAL_COMMUNITY): Payer: Self-pay

## 2023-07-06 ENCOUNTER — Ambulatory Visit (HOSPITAL_COMMUNITY)
Admission: EM | Admit: 2023-07-06 | Discharge: 2023-07-06 | Disposition: A | Discharge status: Home / Self Care | Payer: Medicaid Other | Attending: Internal Medicine | Admitting: Internal Medicine

## 2023-07-06 DIAGNOSIS — G8929 Other chronic pain: Secondary | ICD-10-CM

## 2023-07-06 DIAGNOSIS — N898 Other specified noninflammatory disorders of vagina: Secondary | ICD-10-CM | POA: Diagnosis not present

## 2023-07-06 DIAGNOSIS — B379 Candidiasis, unspecified: Secondary | ICD-10-CM | POA: Diagnosis not present

## 2023-07-06 LAB — POCT URINALYSIS DIP (MANUAL ENTRY)
Bilirubin, UA: NEGATIVE
Blood, UA: NEGATIVE
Glucose, UA: >=1000 mg/dL — AB
Ketones, POC UA: NEGATIVE mg/dL
Leukocytes, UA: NEGATIVE
Nitrite, UA: NEGATIVE
Protein Ur, POC: NEGATIVE mg/dL
Spec Grav, UA: 1.01 (ref 1.010–1.025)
Urobilinogen, UA: 0.2 U/dL
pH, UA: 6 (ref 5.0–8.0)

## 2023-07-06 MED ORDER — METRONIDAZOLE 500 MG PO TABS
500.0000 mg | ORAL_TABLET | Freq: Two times a day (BID) | ORAL | 0 refills | Status: DC
Start: 2023-07-06 — End: 2023-07-06

## 2023-07-06 MED ORDER — METRONIDAZOLE 500 MG PO TABS
500.0000 mg | ORAL_TABLET | Freq: Two times a day (BID) | ORAL | 0 refills | Status: DC
Start: 2023-07-06 — End: 2024-02-24

## 2023-07-06 NOTE — ED Provider Notes (Signed)
[facility]    CSN: [id number] Arrival date & time: 07/06/23  1504      History   Chief Complaint No chief complaint on file.   HPI Ednah Nuhfer is a 62 y.o. female.   62 year old female who presents to urgent care with several week history of foul-smelling urine especially in the morning.  She reports having this in the past secondary to bacterial vaginitis.  She does complain of some vaginal itching but denies any discharge.  She denies any suprapubic pain, abdominal pain, fevers, chills, hematuria, dysuria.  She does complain of urgency and frequency but relates that she is on diuretics which causes this.  She has not had any frank incontinence but more of a urge incontinence.     She also relates chronic lower back pain especially when walking.  This is ongoing and not changed.  She wants to know if there is pain medication we can give her today for this.     Past Medical History:  Diagnosis Date   Asthma    B12 deficiency    CHF (congestive heart failure) (HCC)    COPD (chronic obstructive pulmonary disease) (HCC)    Diabetes mellitus without complication (HCC)    Hypertension    Morbid obesity (HCC)    SOB (shortness of breath)    Urinary incontinence     Patient Active Problem List   Diagnosis Date Noted   Acute on chronic heart failure with preserved ejection fraction (HFpEF) (HCC) 07/22/2021   Diabetes mellitus type 2, uncontrolled 07/22/2021   Orthopnea 07/22/2021   Obesity, Class III, BMI 40-49.9 (morbid obesity) (HCC) 07/22/2021   Chronic diastolic heart failure (HCC) 01/06/2021   Nonrheumatic mitral valve regurgitation 01/06/2021   Diabetes mellitus type 2 in obese 01/06/2021   CHF exacerbation (HCC) 09/03/2020   Type II or unspecified type diabetes mellitus without mention of complication, not stated as uncontrolled 11/07/2013   Hyperglycemia 11/06/2013   COPD exacerbation (HCC) 11/02/2013   PNA (pneumonia) 11/02/2013   Hypoxia 11/02/2013    Acute-on-chronic respiratory failure (HCC) 11/02/2013   COPD (chronic obstructive pulmonary disease) (HCC)    CHF (congestive heart failure) (HCC)    Urinary incontinence    Hypertension     Past Surgical History:  Procedure Laterality Date   CESAREAN SECTION     ECTOPIC PREGNANCY SURGERY     KNEE SURGERY      OB History     Gravida  5   Para  4   Term  4   Preterm      AB  1   Living  4      SAB      IAB      Ectopic  1   Multiple      Live Births               Home Medications    Prior to Admission medications   Medication Sig Start Date End Date Taking? Authorizing Provider  benzonatate (TESSALON) 100 MG capsule Take 1 capsule (100 mg total) by mouth 3 (three) times daily as needed for cough. 10/29/22  Yes Sabas Sous, MD  blood glucose meter kit and supplies KIT Dispense based on patient and insurance preference. Use up to four times daily as directed. 07/25/21  Yes Albertine Grates, MD  Cyanocobalamin (B-12 PO) Take 1 tablet by mouth daily.   Yes [provider]  diclofenac Sodium (VOLTAREN) 1 % GEL Apply 1 application  topically See  admin instructions. Apply topically every morning and every evening as needed for pain 01/07/22  Yes [provider]  ergocalciferol (VITAMIN D2) 1.25 MG (50000 UT) capsule Take 50,000 Units by mouth once a week.   Yes [provider]  ferrous sulfate 325 (65 FE) MG tablet Take 1 tablet (325 mg total) by mouth daily with breakfast. Patient taking differently: Take 325 mg by mouth every Wednesday. 07/26/21  Yes Albertine Grates, MD  glipiZIDE (GLUCOTROL) 5 MG tablet Take 5 mg by mouth daily after supper. 01/07/22  Yes [provider]  guaiFENesin (MUCINEX) 600 MG 12 hr tablet Take 600 mg by mouth 2 (two) times daily as needed for to loosen phlegm or cough.   Yes [provider]  hydrocortisone cream 1 % Apply 1 application. topically 2 (two) times daily as needed for itching.   Yes [provider]  Magnesium Hydroxide (PHILLIPS MILK OF MAGNESIA PO) Take 60 mLs by mouth every Sunday.   Yes [provider]  sacubitril-valsartan (ENTRESTO) 24-26 MG Take 1 tablet by mouth 2 (two) times daily. 10/24/21  Yes Calvert Cantor, MD  acetaminophen (TYLENOL) 500 MG tablet Take 500 mg by mouth daily.    [provider]  albuterol (VENTOLIN HFA) 108 (90 Base) MCG/ACT inhaler Inhale 2 puffs into the lungs every 4 (four) hours as needed for wheezing or shortness of breath. Patient not taking: Reported on 03/30/2022 01/22/22   Cathren Laine, MD  amLODipine (NORVASC) 5 MG tablet Take 5 mg by mouth every morning. 01/07/22   [provider]  budesonide-formoterol (SYMBICORT) 160-4.5 MCG/ACT inhaler Inhale 2 puffs into the lungs every morning. Patient not taking: Reported on 03/30/2022 01/07/22   [provider]  carvedilol (COREG) 6.25 MG tablet Take 1 tablet (6.25 mg total) by mouth 2 (two) times daily with a meal. Patient not taking: Reported on 01/09/2022 10/24/21   Calvert Cantor, MD  fluticasone (FLONASE) 50 MCG/ACT nasal spray Place 2 sprays into both nostrils daily. Patient taking differently: Place 2 sprays into both nostrils daily as needed for allergies or rhinitis. 02/19/22 04/25/22  Jone Baseman, NP  naproxen (NAPROSYN) 500 MG tablet Take 1 tablet (500 mg total) by mouth 2 (two) times daily. 10/29/22   Sabas Sous, MD  torsemide (DEMADEX) 20 MG tablet Take 3 tablets (60 mg total) by mouth 2 (two) times daily. Patient taking differently: Take 20-40 mg by mouth See admin instructions. Take 2 tablets (40 mg) by mouth every morning, take 1 tablet (20 mg) in the evening as needed for swelling 01/09/22 04/25/22  Terald Sleeper, MD    Family History Family History  Problem Relation Age of Onset   Stroke Mother    Asthma Mother     Social History Social History   Tobacco Use   Smoking status: Former    Current packs/day: 0.00    Average packs/day: 1.5  packs/day for 30.0 years (45.0 ttl pk-yrs)    Types: Cigarettes    Start date: 10/27/1980    Quit date: 10/27/2010    Years since quitting: 12.6   Smokeless tobacco: Never  Vaping Use   Vaping status: Never Used  Substance Use Topics   Alcohol use: No   Drug use: No     Allergies   Contrast media [iodinated contrast media], Empagliflozin, Losartan, Torsemide, Lisinopril, Metformin, and Ondansetron   Review of Systems Review of Systems  Constitutional:  Negative for chills and fever.  HENT:  Negative for ear pain  and sore throat.        Ears itching  Eyes:  Negative for pain and visual disturbance.  Respiratory:  Negative for cough and shortness of breath.   Cardiovascular:  Negative for chest pain and palpitations.  Gastrointestinal:  Negative for abdominal pain and vomiting.  Genitourinary:  Positive for urgency. Negative for difficulty urinating, dysuria and hematuria.  Musculoskeletal:  Positive for back pain. Negative for arthralgias.  Skin:  Negative for color change and rash.  Neurological:  Negative for seizures and syncope.  All other systems reviewed and are negative.    Physical Exam Triage Vital Signs ED Triage Vitals [07/06/23 1609]  Encounter Vitals Group     BP (!) 141/82     Systolic BP Percentile      Diastolic BP Percentile      Pulse Rate 91     Resp 16     Temp 98.2 F (36.8 C)     Temp Source Oral     SpO2 93 %     Weight      Height      Head Circumference      Peak Flow      Pain Score      Pain Loc      Pain Education      Exclude from Growth Chart    No data found.  Updated Vital Signs BP (!) 141/82 (BP Location: Left Arm)   Pulse 91   Temp 98.2 F (36.8 C) (Oral)   Resp 16   SpO2 93%   Visual Acuity Right Eye Distance:   Left Eye Distance:   Bilateral Distance:    Right Eye Near:   Left Eye Near:    Bilateral Near:     Physical Exam Vitals and nursing note reviewed.  Constitutional:      General: She is not in acute  distress.    Appearance: She is well-developed.  HENT:     Head: Normocephalic and atraumatic.  Eyes:     Conjunctiva/sclera: Conjunctivae normal.  Cardiovascular:     Rate and Rhythm: Normal rate and regular rhythm.     Heart sounds: No murmur heard. Pulmonary:     Effort: Pulmonary effort is normal. No respiratory distress.     Breath sounds: Normal breath sounds.  Abdominal:     General: Bowel sounds are normal.     Palpations: Abdomen is soft.     Tenderness: There is no abdominal tenderness.  Musculoskeletal:        General: No swelling.     Cervical back: Neck supple.       Legs:  Skin:    General: Skin is warm and dry.     Capillary Refill: Capillary refill takes less than 2 seconds.  Neurological:     Mental Status: She is alert.  Psychiatric:        Mood and Affect: Mood normal.      UC Treatments / Results  Labs (all labs ordered are listed, but only abnormal results are displayed) Labs Reviewed  POCT URINALYSIS DIP (MANUAL ENTRY) - Abnormal; Notable for the following components:      Result Value   Glucose, UA >=1,000 (*)    All other components within normal limits    EKG   Radiology No results found.  Procedures Procedures (including critical care time)  Medications Ordered in UC Medications - No data to display  Initial Impression / Assessment and Plan / UC Course  I have  reviewed the triage vital signs and the nursing notes.  Pertinent labs & imaging results that were available during my care of the patient were reviewed by me and considered in my medical decision making (see chart for details).     Yeast infection  Vaginal itching  Chronic midline low back pain without sciatica   Patient refuses testing for bacterial vaginitis today but given her symptoms we will go ahead and treat with metronidazole twice daily for 7 days.  Advised the patient that if there is symptoms do not improve or worsen that she will need to return to urgent  care for further evaluation  In regards to her chronic low back pain, we have recommended that she follow-up with her PCP or with an orthopedist as this needs further evaluation.  May use Tylenol or ibuprofen for discomfort or over-the-counter pain relieving patches. Final Clinical Impressions(s) / UC Diagnoses   Final diagnoses:  None   Discharge Instructions   None    ED Prescriptions   None    PDMP not reviewed this encounter.   Landis Martins, [place] 07/06/23 1723

## 2023-07-06 NOTE — Discharge Instructions (Addendum)
Take metronidazole 2 times daily for 7 days and take with food but if symptoms do not improve or fail to resolve, you need to return to urgent care for further testing. Stay hydrated, drink plenty.   Recommend following up with PCP or Orthopedist regarding chronic back pain.

## 2023-07-06 NOTE — ED Triage Notes (Signed)
Here for urinary incontinence for several days. Pt reports a awful smell.

## 2023-08-07 ENCOUNTER — Encounter (HOSPITAL_COMMUNITY): Payer: Self-pay

## 2023-08-07 ENCOUNTER — Ambulatory Visit (HOSPITAL_COMMUNITY)
Admission: EM | Admit: 2023-08-07 | Discharge: 2023-08-07 | Disposition: A | Discharge status: Home / Self Care | Payer: Medicaid Other | Attending: Internal Medicine | Admitting: Internal Medicine

## 2023-08-07 DIAGNOSIS — M545 Low back pain, unspecified: Secondary | ICD-10-CM | POA: Diagnosis not present

## 2023-08-07 DIAGNOSIS — G8929 Other chronic pain: Secondary | ICD-10-CM

## 2023-08-07 MED ORDER — METHYLPREDNISOLONE SODIUM SUCC 125 MG IJ SOLR
80.0000 mg | Freq: Once | INTRAMUSCULAR | Status: AC
  Administered 2023-08-07: 80 mg via INTRAMUSCULAR

## 2023-08-07 MED ORDER — METHYLPREDNISOLONE SODIUM SUCC 125 MG IJ SOLR
INTRAMUSCULAR | Status: AC
  Filled 2023-08-07: qty 2

## 2023-08-07 NOTE — ED Triage Notes (Addendum)
Patient c/o mid lower back pain x 1 week. Patient states, "It's that sciatica stuff". Patient states she is needing something for her allergies.  Patient states she took Tylenol at 1200 today.   Patient's O2 sats 92%. Patient states that she wears O2 2L/min via [place] intermittently due to history COPD, asthma.

## 2023-08-07 NOTE — ED Provider Notes (Signed)
[facility]    CSN: [id number] Arrival date & time: 08/07/23  1531      History   Chief Complaint Chief Complaint  Patient presents with   Back Pain    HPI Kathryn Mahoney is a 62 y.o. female.    Back Pain Associated symptoms: no dysuria, no fever, no numbness and no weakness   Left-sided low back pain this is a chronic problem, states that for some sciatica symptoms have been worse over the last few weeks, she is requesting a cortisone shot.  Pain is constant, worse with ambulation and certain movements.  Denies recent injuries or falls, abdominal pain, urinary symptoms, incontinence, lower extremity weakness or paresthesias.  Past Medical History:  Diagnosis Date   Asthma    B12 deficiency    CHF (congestive heart failure) (HCC)    COPD (chronic obstructive pulmonary disease) (HCC)    Diabetes mellitus without complication (HCC)    Hypertension    Morbid obesity (HCC)    SOB (shortness of breath)    Urinary incontinence     Patient Active Problem List   Diagnosis Date Noted   Acute on chronic heart failure with preserved ejection fraction (HFpEF) (HCC) 07/22/2021   Diabetes mellitus type 2, uncontrolled 07/22/2021   Orthopnea 07/22/2021   Obesity, Class III, BMI 40-49.9 (morbid obesity) (HCC) 07/22/2021   Chronic diastolic heart failure (HCC) 01/06/2021   Nonrheumatic mitral valve regurgitation 01/06/2021   Type 2 diabetes mellitus with obesity (HCC) 01/06/2021   CHF exacerbation (HCC) 09/03/2020   Type II or unspecified type diabetes mellitus without mention of complication, not stated as uncontrolled 11/07/2013   Hyperglycemia 11/06/2013   COPD exacerbation (HCC) 11/02/2013   PNA (pneumonia) 11/02/2013   Hypoxia 11/02/2013   Acute-on-chronic respiratory failure (HCC) 11/02/2013   COPD (chronic obstructive pulmonary disease) (HCC)    CHF (congestive heart failure) (HCC)    Urinary incontinence    Hypertension     Past Surgical History:   Procedure Laterality Date   CESAREAN SECTION     ECTOPIC PREGNANCY SURGERY     KNEE SURGERY      OB History     Gravida  5   Para  4   Term  4   Preterm      AB  1   Living  4      SAB      IAB      Ectopic  1   Multiple      Live Births               Home Medications    Prior to Admission medications   Medication Sig Start Date End Date Taking? Authorizing Provider  acetaminophen (TYLENOL) 500 MG tablet Take 500 mg by mouth daily.    [provider]  albuterol (VENTOLIN HFA) 108 (90 Base) MCG/ACT inhaler Inhale 2 puffs into the lungs every 4 (four) hours as needed for wheezing or shortness of breath. Patient not taking: Reported on 03/30/2022 01/22/22   Cathren Laine, MD  amLODipine (NORVASC) 5 MG tablet Take 5 mg by mouth every morning. 01/07/22   [provider]  benzonatate (TESSALON) 100 MG capsule Take 1 capsule (100 mg total) by mouth 3 (three) times daily as needed for cough. 10/29/22   Sabas Sous, MD  blood glucose meter kit and supplies KIT Dispense based on patient and insurance preference. Use up to four times daily as directed. 07/25/21   Albertine Grates, MD  budesonide-formoterol (  SYMBICORT) 160-4.5 MCG/ACT inhaler Inhale 2 puffs into the lungs every morning. Patient not taking: Reported on 03/30/2022 01/07/22   [provider]  carvedilol (COREG) 6.25 MG tablet Take 1 tablet (6.25 mg total) by mouth 2 (two) times daily with a meal. Patient not taking: Reported on 01/09/2022 10/24/21   Calvert Cantor, MD  Cyanocobalamin (B-12 PO) Take 1 tablet by mouth daily.    [provider]  diclofenac Sodium (VOLTAREN) 1 % GEL Apply 1 application  topically See admin instructions. Apply topically every morning and every evening as needed for pain 01/07/22   [provider]  ergocalciferol (VITAMIN D2) 1.25 MG (50000 UT) capsule Take 50,000 Units by mouth once a week.    [provider]  ferrous sulfate 325 (65 FE) MG  tablet Take 1 tablet (325 mg total) by mouth daily with breakfast. Patient taking differently: Take 325 mg by mouth every Wednesday. 07/26/21   Albertine Grates, MD  fluticasone (FLONASE) 50 MCG/ACT nasal spray Place 2 sprays into both nostrils daily. Patient taking differently: Place 2 sprays into both nostrils daily as needed for allergies or rhinitis. 02/19/22 04/25/22  Jone Baseman, NP  glipiZIDE (GLUCOTROL) 5 MG tablet Take 5 mg by mouth daily after supper. 01/07/22   [provider]  guaiFENesin (MUCINEX) 600 MG 12 hr tablet Take 600 mg by mouth 2 (two) times daily as needed for to loosen phlegm or cough.    [provider]  hydrocortisone cream 1 % Apply 1 application. topically 2 (two) times daily as needed for itching.    [provider]  Magnesium Hydroxide (PHILLIPS MILK OF MAGNESIA PO) Take 60 mLs by mouth every Sunday.    [provider]  metroNIDAZOLE (FLAGYL) 500 MG tablet Take 1 tablet (500 mg total) by mouth 2 (two) times daily. 07/06/23   White, Elizabeth A, PA-C  naproxen (NAPROSYN) 500 MG tablet Take 1 tablet (500 mg total) by mouth 2 (two) times daily. 10/29/22   Sabas Sous, MD  sacubitril-valsartan (ENTRESTO) 24-26 MG Take 1 tablet by mouth 2 (two) times daily. 10/24/21   Calvert Cantor, MD  torsemide (DEMADEX) 20 MG tablet Take 3 tablets (60 mg total) by mouth 2 (two) times daily. Patient taking differently: Take 20-40 mg by mouth See admin instructions. Take 2 tablets (40 mg) by mouth every morning, take 1 tablet (20 mg) in the evening as needed for swelling 01/09/22 04/25/22  Terald Sleeper, MD    Family History Family History  Problem Relation Age of Onset   Stroke Mother    Asthma Mother     Social History Social History   Tobacco Use   Smoking status: Former    Current packs/day: 0.00    Average packs/day: 1.5 packs/day for 30.0 years (45.0 ttl pk-yrs)    Types: Cigarettes    Start date: 10/27/1980    Quit date: 10/27/2010    Years  since quitting: 12.7   Smokeless tobacco: Never  Vaping Use   Vaping status: Never Used  Substance Use Topics   Alcohol use: No   Drug use: No     Allergies   Contrast media [iodinated contrast media], Empagliflozin, Losartan, Torsemide, Lisinopril, Metformin, and Ondansetron   Review of Systems Review of Systems  Constitutional:  Negative for chills and fever.  Genitourinary:  Negative for decreased urine volume, dysuria and flank pain.  Musculoskeletal:  Positive for back pain. Negative for gait problem.  Skin:  Negative for rash.  Neurological:  Negative for weakness and numbness.     Physical Exam Triage Vital Signs ED Triage Vitals [08/07/23 1554]  Encounter Vitals Group     BP 114/63     Systolic BP Percentile      Diastolic BP Percentile      Pulse Rate (!) 102     Resp 16     Temp 98 F (36.7 C)     Temp Source Oral     SpO2 92 %     Weight      Height      Head Circumference      Peak Flow      Pain Score 9     Pain Loc      Pain Education      Exclude from Growth Chart    No data found.  Updated Vital Signs BP 114/63 (BP Location: Left Arm)   Pulse (!) 102   Temp 98 F (36.7 C) (Oral)   Resp 16   SpO2 92%   Visual Acuity Right Eye Distance:   Left Eye Distance:   Bilateral Distance:    Right Eye Near:   Left Eye Near:    Bilateral Near:     Physical Exam Vitals and nursing note reviewed.  Constitutional:      Appearance: She is obese. She is not ill-appearing.  HENT:     Head: Normocephalic.  Cardiovascular:     Rate and Rhythm: Normal rate and regular rhythm.     Heart sounds: Normal heart sounds.  Pulmonary:     Effort: Pulmonary effort is normal. No respiratory distress.     Breath sounds: Normal breath sounds. No wheezing.  Abdominal:     General: Bowel sounds are normal.     Palpations: Abdomen is soft.  Musculoskeletal:     Lumbar back: No tenderness or bony tenderness. Negative right straight leg raise test and negative  left straight leg raise test.  Neurological:     General: No focal deficit present.     Mental Status: She is alert and oriented to person, place, and time.     Sensory: No sensory deficit.     Motor: No weakness.     Deep Tendon Reflexes: Reflexes normal.  Psychiatric:        Mood and Affect: Mood normal.      UC Treatments / Results  Labs (all labs ordered are listed, but only abnormal results are displayed) Labs Reviewed - No data to display  EKG   Radiology No results found.  Procedures Procedures (including critical care time)  Medications Ordered in UC Medications - No data to display  Initial Impression / Assessment and Plan / UC Course  I have reviewed the triage vital signs and the nursing notes.  Pertinent labs & imaging results that were available during my care of the patient were reviewed by me and considered in my medical decision making (see chart for details).     62 year old female with a history of sciatica and chronic back pain presents with exacerbation of her pain x 3 weeks requesting a shot of cortisone.  Over-the-counter medicines do not work, no red flags, no fever, no neurologic deficits  Final Clinical Impressions(s) / UC Diagnoses   Final diagnoses:  None   Discharge Instructions   None    ED Prescriptions   None    PDMP not reviewed this encounter.   Meliton Rattan, [place] 08/07/23 [phone]

## 2023-11-10 ENCOUNTER — Emergency Department (HOSPITAL_BASED_OUTPATIENT_CLINIC_OR_DEPARTMENT_OTHER)
Admission: EM | Admit: 2023-11-10 | Discharge: 2023-11-10 | Disposition: A | Discharge status: Home / Self Care | Payer: Medicaid Other | Attending: Emergency Medicine | Admitting: Emergency Medicine

## 2023-11-10 ENCOUNTER — Encounter (HOSPITAL_BASED_OUTPATIENT_CLINIC_OR_DEPARTMENT_OTHER): Payer: Self-pay | Admitting: Emergency Medicine

## 2023-11-10 ENCOUNTER — Other Ambulatory Visit: Payer: Self-pay

## 2023-11-10 ENCOUNTER — Emergency Department (HOSPITAL_BASED_OUTPATIENT_CLINIC_OR_DEPARTMENT_OTHER): Payer: Medicaid Other | Admitting: Radiology

## 2023-11-10 DIAGNOSIS — J069 Acute upper respiratory infection, unspecified: Secondary | ICD-10-CM | POA: Diagnosis not present

## 2023-11-10 DIAGNOSIS — I509 Heart failure, unspecified: Secondary | ICD-10-CM | POA: Diagnosis not present

## 2023-11-10 DIAGNOSIS — J449 Chronic obstructive pulmonary disease, unspecified: Secondary | ICD-10-CM | POA: Insufficient documentation

## 2023-11-10 DIAGNOSIS — Z7984 Long term (current) use of oral hypoglycemic drugs: Secondary | ICD-10-CM | POA: Diagnosis not present

## 2023-11-10 DIAGNOSIS — B974 Respiratory syncytial virus as the cause of diseases classified elsewhere: Secondary | ICD-10-CM | POA: Diagnosis not present

## 2023-11-10 DIAGNOSIS — Z20822 Contact with and (suspected) exposure to covid-19: Secondary | ICD-10-CM | POA: Insufficient documentation

## 2023-11-10 DIAGNOSIS — R059 Cough, unspecified: Secondary | ICD-10-CM | POA: Diagnosis present

## 2023-11-10 DIAGNOSIS — Z79899 Other long term (current) drug therapy: Secondary | ICD-10-CM | POA: Insufficient documentation

## 2023-11-10 DIAGNOSIS — I110 Hypertensive heart disease with heart failure: Secondary | ICD-10-CM | POA: Diagnosis not present

## 2023-11-10 DIAGNOSIS — E119 Type 2 diabetes mellitus without complications: Secondary | ICD-10-CM | POA: Insufficient documentation

## 2023-11-10 LAB — RESP PANEL BY RT-PCR (RSV, FLU A&B, COVID)  RVPGX2
Influenza A by PCR: NEGATIVE
Influenza B by PCR: NEGATIVE
Resp Syncytial Virus by PCR: POSITIVE — AB
SARS Coronavirus 2 by RT PCR: NEGATIVE

## 2023-11-10 MED ORDER — BENZONATATE 100 MG PO CAPS: 100.0000 mg | ORAL_CAPSULE | Freq: Three times a day (TID) | ORAL | 0 refills | Status: DC

## 2023-11-10 MED ORDER — FLUCONAZOLE 200 MG PO TABS: 200.0000 mg | ORAL_TABLET | Freq: Every day | ORAL | 0 refills | Status: DC

## 2023-11-10 NOTE — Discharge Instructions (Addendum)
 Please refer to the attached instructions

## 2023-11-10 NOTE — ED Provider Notes (Signed)
 [place] EMERGENCY DEPARTMENT AT [facility] Provider Note   CSN: [id number] Arrival date & time: 11/10/23  1529     History  Chief Complaint  Patient presents with   Cough    Kathryn Mahoney is a 63 y.o. female.  63 year old female with history of COPD, CHF, HTN,, T2DM presents to the ED with several days of productive cough (phlegm). Patient is occasionally incontinent of urine during coughing spells. She denies chest pain, abdominal pain, shortness of breath. No fevers. No back or flank pain.  The history is provided by the patient.  Cough Cough characteristics:  Productive Sputum characteristics:  Nondescript Severity:  Moderate Onset quality:  Gradual Duration:  3 days Timing:  Sporadic Progression:  Waxing and waning Chronicity:  New Associated symptoms: no chest pain, no chills, no fever, no myalgias, no shortness of breath and no wheezing        Home Medications Prior to Admission medications   Medication Sig Start Date End Date Taking? Authorizing Provider  acetaminophen  (TYLENOL ) 500 MG tablet Take 500 mg by mouth daily.    [provider]  albuterol  (VENTOLIN  HFA) 108 (90 Base) MCG/ACT inhaler Inhale 2 puffs into the lungs every 4 (four) hours as needed for wheezing or shortness of breath. Patient not taking: Reported on 03/30/2022 01/22/22   Steinl, Kevin, MD  amLODipine  (NORVASC ) 5 MG tablet Take 5 mg by mouth every morning. 01/07/22   [provider]  benzonatate  (TESSALON ) 100 MG capsule Take 1 capsule (100 mg total) by mouth 3 (three) times daily as needed for cough. 10/29/22   Theadore Ozell HERO, MD  blood glucose meter kit and supplies KIT Dispense based on patient and insurance preference. Use up to four times daily as directed. 07/25/21   Jerri Keys, MD  budesonide-formoterol  [facility]) 160-4.5 MCG/ACT inhaler Inhale 2 puffs into the lungs every morning. Patient not taking: Reported on 03/30/2022 01/07/22   [provider]   carvedilol  (COREG ) 6.25 MG tablet Take 1 tablet (6.25 mg total) by mouth 2 (two) times daily with a meal. Patient not taking: Reported on 01/09/2022 10/24/21   Rizwan, Saima, MD  Cyanocobalamin (B-12 PO) Take 1 tablet by mouth daily.    [provider]  diclofenac  Sodium (VOLTAREN ) 1 % GEL Apply 1 application  topically See admin instructions. Apply topically every morning and every evening as needed for pain 01/07/22   [provider]  ergocalciferol  (VITAMIN D2) 1.25 MG (50000 UT) capsule Take 50,000 Units by mouth once a week.    [provider]  ferrous sulfate  325 (65 FE) MG tablet Take 1 tablet (325 mg total) by mouth daily with breakfast. Patient taking differently: Take 325 mg by mouth every Wednesday. 07/26/21   Jerri Keys, MD  fluticasone  (FLONASE ) 50 MCG/ACT nasal spray Place 2 sprays into both nostrils daily. Patient taking differently: Place 2 sprays into both nostrils daily as needed for allergies or rhinitis. 02/19/22 04/25/22  Jeannetta Channing CROME, NP  glipiZIDE  (GLUCOTROL ) 5 MG tablet Take 5 mg by mouth daily after supper. 01/07/22   [provider]  guaiFENesin  (MUCINEX ) 600 MG 12 hr tablet Take 600 mg by mouth 2 (two) times daily as needed for to loosen phlegm or cough.    [provider]  hydrocortisone cream 1 % Apply 1 application. topically 2 (two) times daily as needed for itching.    [provider]  Magnesium Hydroxide (PHILLIPS MILK OF MAGNESIA PO) Take 60 mLs by mouth every  Sunday.    [provider]  metroNIDAZOLE  (FLAGYL ) 500 MG tablet Take 1 tablet (500 mg total) by mouth 2 (two) times daily. 07/06/23   White, Elizabeth A, PA-C  naproxen  (NAPROSYN ) 500 MG tablet Take 1 tablet (500 mg total) by mouth 2 (two) times daily. 10/29/22   Bero, Michael M, MD  sacubitril -valsartan  (ENTRESTO ) 24-26 MG Take 1 tablet by mouth 2 (two) times daily. 10/24/21   Rizwan, Saima, MD  torsemide  (DEMADEX ) 20 MG tablet Take 3 tablets (60 mg  total) by mouth 2 (two) times daily. Patient taking differently: Take 20-40 mg by mouth See admin instructions. Take 2 tablets (40 mg) by mouth every morning, take 1 tablet (20 mg) in the evening as needed for swelling 01/09/22 04/25/22  Cottie Donnice PARAS, MD      Allergies    Contrast media [iodinated contrast media], Empagliflozin, Losartan , Torsemide , Lisinopril , Metformin , and Ondansetron    Review of Systems   Review of Systems  Constitutional:  Negative for chills and fever.  Respiratory:  Positive for cough. Negative for shortness of breath and wheezing.   Cardiovascular:  Negative for chest pain.  Musculoskeletal:  Negative for myalgias.  All other systems reviewed and are negative.   Physical Exam Updated Vital Signs BP (!) 140/70 (BP Location: Right Arm)   Pulse 100   Temp 98.9 F (37.2 C)   Resp 18   Ht 5' 9 (1.753 m)   Wt 127 kg   SpO2 95%   BMI 41.35 kg/m  Physical Exam Constitutional:      Appearance: She is obese. She is not ill-appearing.  HENT:     Head: Normocephalic.     Nose: Nose normal.  Cardiovascular:     Rate and Rhythm: Normal rate.  Pulmonary:     Effort: Pulmonary effort is normal.     Comments: Coarse lung sounds with auscultation. Abdominal:     Palpations: Abdomen is soft.  Musculoskeletal:        General: Normal range of motion.  Skin:    General: Skin is warm and dry.  Neurological:     Mental Status: She is alert and oriented to person, place, and time.  Psychiatric:        Mood and Affect: Mood normal.        Behavior: Behavior normal.     ED Results / Procedures / Treatments   Labs (all labs ordered are listed, but only abnormal results are displayed) Labs Reviewed  RESP PANEL BY RT-PCR (RSV, FLU A&B, COVID)  RVPGX2 - Abnormal; Notable for the following components:      Result Value   Resp Syncytial Virus by PCR POSITIVE (*)    All other components within normal limits  URINALYSIS, ROUTINE W REFLEX MICROSCOPIC     EKG None  Radiology DG Chest 2 View Result Date: 11/10/2023 CLINICAL DATA:  Productive cough EXAM: CHEST - 2 VIEW COMPARISON:  10/29/2022 FINDINGS: Borderline cardiomegaly with slight central congestion. No focal opacity, pleural effusion or pneumothorax IMPRESSION: Borderline cardiomegaly with slight central congestion. Electronically Signed   By: Luke Bun M.D.   On: 11/10/2023 20:35    Procedures Procedures    Medications Ordered in ED Medications - No data to display  ED Course/ Medical Decision Making/ A&P                                 Medical Decision Making Amount and/or  Complexity of Data Reviewed Labs: ordered. Radiology: ordered.  Risk Prescription drug management.   Pt symptoms consistent with URI. Testing is positive for RSV. Patient non-toxic appearing. CXR negative for acute infiltrate. Pt will be discharged with symptomatic treatment.  Discussed return precautions.  Pt is hemodynamically stable & in NAD prior to discharge.         Final Clinical Impression(s) / ED Diagnoses Final diagnoses:  Acute URI due to respiratory syncytial virus (RSV)    Rx / DC Orders ED Discharge Orders          Ordered    benzonatate  (TESSALON ) 100 MG capsule  Every 8 hours        11/10/23 2139    fluconazole  (DIFLUCAN ) 200 MG tablet  Daily        11/10/23 2139              Claudene Lenis, NP 11/11/23 0016    Yolande Lamar BROCKS, MD 11/13/23 [phone]

## 2023-11-10 NOTE — ED Notes (Signed)
 Urine sample fell in the commode so unable to get sample.

## 2023-11-10 NOTE — ED Triage Notes (Signed)
 C/o productive cough x 3 days. Hx of COPD and CHF. States she is worried about fluid retention d/t "puffy face". Has not missed lasix dose.

## 2024-02-23 ENCOUNTER — Observation Stay (HOSPITAL_COMMUNITY)
Admission: EM | Admit: 2024-02-23 | Discharge: 2024-02-27 | Disposition: A | Discharge status: Home / Self Care | Attending: Emergency Medicine | Admitting: Emergency Medicine

## 2024-02-23 ENCOUNTER — Emergency Department (HOSPITAL_COMMUNITY)

## 2024-02-23 ENCOUNTER — Other Ambulatory Visit: Payer: Self-pay

## 2024-02-23 ENCOUNTER — Encounter (HOSPITAL_COMMUNITY): Payer: Self-pay

## 2024-02-23 DIAGNOSIS — R079 Chest pain, unspecified: Secondary | ICD-10-CM | POA: Diagnosis present

## 2024-02-23 DIAGNOSIS — I340 Nonrheumatic mitral (valve) insufficiency: Secondary | ICD-10-CM | POA: Diagnosis present

## 2024-02-23 DIAGNOSIS — R0609 Other forms of dyspnea: Principal | ICD-10-CM

## 2024-02-23 DIAGNOSIS — D509 Iron deficiency anemia, unspecified: Secondary | ICD-10-CM | POA: Diagnosis not present

## 2024-02-23 DIAGNOSIS — E876 Hypokalemia: Secondary | ICD-10-CM | POA: Diagnosis not present

## 2024-02-23 DIAGNOSIS — Z79899 Other long term (current) drug therapy: Secondary | ICD-10-CM | POA: Insufficient documentation

## 2024-02-23 DIAGNOSIS — Z87891 Personal history of nicotine dependence: Secondary | ICD-10-CM | POA: Diagnosis not present

## 2024-02-23 DIAGNOSIS — J449 Chronic obstructive pulmonary disease, unspecified: Secondary | ICD-10-CM | POA: Diagnosis not present

## 2024-02-23 DIAGNOSIS — D75839 Thrombocytosis, unspecified: Secondary | ICD-10-CM | POA: Insufficient documentation

## 2024-02-23 DIAGNOSIS — G4733 Obstructive sleep apnea (adult) (pediatric): Secondary | ICD-10-CM | POA: Insufficient documentation

## 2024-02-23 DIAGNOSIS — I5033 Acute on chronic diastolic (congestive) heart failure: Principal | ICD-10-CM | POA: Insufficient documentation

## 2024-02-23 DIAGNOSIS — E8809 Other disorders of plasma-protein metabolism, not elsewhere classified: Secondary | ICD-10-CM | POA: Diagnosis not present

## 2024-02-23 DIAGNOSIS — E1169 Type 2 diabetes mellitus with other specified complication: Secondary | ICD-10-CM | POA: Diagnosis present

## 2024-02-23 DIAGNOSIS — Z6841 Body Mass Index (BMI) 40.0 and over, adult: Secondary | ICD-10-CM | POA: Insufficient documentation

## 2024-02-23 DIAGNOSIS — I110 Hypertensive heart disease with heart failure: Secondary | ICD-10-CM | POA: Insufficient documentation

## 2024-02-23 DIAGNOSIS — M549 Dorsalgia, unspecified: Secondary | ICD-10-CM | POA: Diagnosis not present

## 2024-02-23 DIAGNOSIS — Z7984 Long term (current) use of oral hypoglycemic drugs: Secondary | ICD-10-CM | POA: Insufficient documentation

## 2024-02-23 DIAGNOSIS — I5189 Other ill-defined heart diseases: Secondary | ICD-10-CM

## 2024-02-23 DIAGNOSIS — I10 Essential (primary) hypertension: Secondary | ICD-10-CM | POA: Diagnosis present

## 2024-02-23 DIAGNOSIS — R609 Edema, unspecified: Secondary | ICD-10-CM

## 2024-02-23 DIAGNOSIS — R002 Palpitations: Secondary | ICD-10-CM

## 2024-02-23 DIAGNOSIS — E66813 Obesity, class 3: Secondary | ICD-10-CM | POA: Diagnosis not present

## 2024-02-23 LAB — CBC
HCT: 39 % (ref 36.0–46.0)
Hemoglobin: 11.4 g/dL — ABNORMAL LOW (ref 12.0–15.0)
MCH: 25.4 pg — ABNORMAL LOW (ref 26.0–34.0)
MCHC: 29.2 g/dL — ABNORMAL LOW (ref 30.0–36.0)
MCV: 86.9 fL (ref 80.0–100.0)
Platelets: 525 10*3/uL — ABNORMAL HIGH (ref 150–400)
RBC: 4.49 MIL/uL (ref 3.87–5.11)
RDW: 16.9 % — ABNORMAL HIGH (ref 11.5–15.5)
WBC: 12.2 10*3/uL — ABNORMAL HIGH (ref 4.0–10.5)
nRBC: 0 % (ref 0.0–0.2)

## 2024-02-23 LAB — BASIC METABOLIC PANEL WITH GFR
Anion gap: 10 (ref 5–15)
BUN: 18 mg/dL (ref 8–23)
CO2: 31 mmol/L (ref 22–32)
Calcium: 9.4 mg/dL (ref 8.9–10.3)
Chloride: 100 mmol/L (ref 98–111)
Creatinine, Ser: 0.8 mg/dL (ref 0.44–1.00)
GFR, Estimated: >60 mL/min (ref >60)
Glucose, Bld: 128 mg/dL — ABNORMAL HIGH (ref 70–99)
Potassium: 3.3 mmol/L — ABNORMAL LOW (ref 3.5–5.1)
Sodium: 141 mmol/L (ref 135–145)

## 2024-02-23 LAB — MAGNESIUM: Magnesium: 1.9 mg/dL (ref 1.7–2.4)

## 2024-02-23 LAB — TROPONIN I (HIGH SENSITIVITY): Troponin I (High Sensitivity): 7 ng/L (ref <18)

## 2024-02-23 LAB — BRAIN NATRIURETIC PEPTIDE: B Natriuretic Peptide: 51.8 pg/mL (ref 0.0–100.0)

## 2024-02-23 MED ORDER — FUROSEMIDE 10 MG/ML IJ SOLN
40.0000 mg | Freq: Once | INTRAMUSCULAR | Status: DC
  Administered 2024-02-24: 40 mg via INTRAVENOUS
  Filled 2024-02-23: qty 4

## 2024-02-23 MED ORDER — POTASSIUM CHLORIDE CRYS ER 20 MEQ PO TBCR
40.0000 meq | EXTENDED_RELEASE_TABLET | Freq: Once | ORAL | Status: AC
  Administered 2024-02-24: 40 meq via ORAL
  Filled 2024-02-23: qty 2

## 2024-02-23 NOTE — ED Provider Notes (Signed)
 [place] EMERGENCY DEPARTMENT AT [facility] Provider Note   CSN: [id number] Arrival date & time: 02/23/24  2224     History  Chief Complaint  Patient presents with   Chest Pain    Kathryn Mahoney is a 63 y.o. female.  Patient with history of COPD, CHF, diabetes, hypertension, OSA on CPAP presents today with complaints of chest pain, shortness of breath. States that same has been ongoing for the past few weeks. She feels like she is fluid overloaded. She feels like the Lasix  she takes every day has not been giving her good output the past few days. She notes that she has been taking either 2 or 3 20 mg Lasix  every day without any real schedule or reason for one versus the other. She states that her abdomen is more distended than it normally is. She states that normally when this happens she comes in for a few days of diuresis and feels better. She is unsure what her weights have been running as her scale broke recently. She does have oxygen in place as needed at home. She does not use it very often, but has been using it around the house for the past few days. Notes the she has not been using her CPAP for the past few days either and has switched to sleeping with her oxygen cannula instead. She has woken up a few nights short of breath. She denies cough or congestion. No nausea, vomiting, or diarrhea. No leg pain, recent travel, or recent surgeries. No fevers or chills.  The history is provided by the patient. No language interpreter was used.  Chest Pain Associated symptoms: shortness of breath        Home Medications Prior to Admission medications   Medication Sig Start Date End Date Taking? Authorizing Provider  acetaminophen  (TYLENOL ) 500 MG tablet Take 500 mg by mouth daily.    [provider]  albuterol  (VENTOLIN  HFA) 108 (90 Base) MCG/ACT inhaler Inhale 2 puffs into the lungs every 4 (four) hours as needed for wheezing or shortness of breath. Patient not  taking: Reported on 03/30/2022 01/22/22   Guadalupe Lee, MD  amLODipine  (NORVASC ) 5 MG tablet Take 5 mg by mouth every morning. 01/07/22   [provider]  benzonatate  (TESSALON ) 100 MG capsule Take 1 capsule (100 mg total) by mouth 3 (three) times daily as needed for cough. 10/29/22   Edson Graces, MD  benzonatate  (TESSALON ) 100 MG capsule Take 1 capsule (100 mg total) by mouth every 8 (eight) hours. 11/10/23   Gigi Kyle, NP  blood glucose meter kit and supplies KIT Dispense based on patient and insurance preference. Use up to four times daily as directed. 07/25/21   Lavaughn Portland, MD  budesonide-formoterol  [facility]) 160-4.5 MCG/ACT inhaler Inhale 2 puffs into the lungs every morning. Patient not taking: Reported on 03/30/2022 01/07/22   [provider]  carvedilol  (COREG ) 6.25 MG tablet Take 1 tablet (6.25 mg total) by mouth 2 (two) times daily with a meal. Patient not taking: Reported on 01/09/2022 10/24/21   Rizwan, Saima, MD  Cyanocobalamin (B-12 PO) Take 1 tablet by mouth daily.    [provider]  diclofenac Sodium (VOLTAREN) 1 % GEL Apply 1 application  topically See admin instructions. Apply topically every morning and every evening as needed for pain 01/07/22   [provider]  ergocalciferol  (VITAMIN D2) 1.25 MG (50000 UT) capsule Take 50,000 Units by mouth once a week.    [provider]  ferrous sulfate  325 (65 FE) MG tablet Take 1 tablet (325 mg total) by mouth daily with breakfast. Patient taking differently: Take 325 mg by mouth every Wednesday. 07/26/21   Lavaughn Portland, MD  fluconazole  (DIFLUCAN ) 200 MG tablet Take 1 tablet (200 mg total) by mouth daily. 11/10/23   Gigi Kyle, NP  fluticasone  (FLONASE ) 50 MCG/ACT nasal spray Place 2 sprays into both nostrils daily. Patient taking differently: Place 2 sprays into both nostrils daily as needed for allergies or rhinitis. 02/19/22 04/25/22  Damian Duke, NP  glipiZIDE  (GLUCOTROL ) 5 MG tablet Take 5 mg by  mouth daily after supper. 01/07/22   [provider]  guaiFENesin  (MUCINEX ) 600 MG 12 hr tablet Take 600 mg by mouth 2 (two) times daily as needed for to loosen phlegm or cough.    [provider]  hydrocortisone cream 1 % Apply 1 application. topically 2 (two) times daily as needed for itching.    [provider]  Magnesium Hydroxide (PHILLIPS MILK OF MAGNESIA PO) Take 60 mLs by mouth every Sunday.    [provider]  metroNIDAZOLE  (FLAGYL ) 500 MG tablet Take 1 tablet (500 mg total) by mouth 2 (two) times daily. 07/06/23   White, Elizabeth A, PA-C  naproxen  (NAPROSYN ) 500 MG tablet Take 1 tablet (500 mg total) by mouth 2 (two) times daily. 10/29/22   Edson Graces, MD  sacubitril -valsartan  (ENTRESTO ) 24-26 MG Take 1 tablet by mouth 2 (two) times daily. 10/24/21   Rizwan, Saima, MD  torsemide  (DEMADEX ) 20 MG tablet Take 3 tablets (60 mg total) by mouth 2 (two) times daily. Patient taking differently: Take 20-40 mg by mouth See admin instructions. Take 2 tablets (40 mg) by mouth every morning, take 1 tablet (20 mg) in the evening as needed for swelling 01/09/22 04/25/22  Arvilla Birmingham, MD      Allergies    Contrast media [iodinated contrast media], Empagliflozin, Losartan , Torsemide , Lisinopril , Metformin , and Ondansetron    Review of Systems   Review of Systems  Respiratory:  Positive for shortness of breath.   Cardiovascular:  Positive for chest pain.  All other systems reviewed and are negative.   Physical Exam Updated Vital Signs BP (!) 161/76   Pulse (!) 105   Temp 99.1 F (37.3 C) (Oral)   Resp 20   Ht 5\' 9"  (1.753 m)   Wt 127 kg   SpO2 96%   BMI 41.35 kg/m  Physical Exam Vitals and nursing note reviewed.  Constitutional:      General: She is not in acute distress.    Appearance: Normal appearance. She is normal weight. She is not ill-appearing, toxic-appearing or diaphoretic.  HENT:     Head: Normocephalic and atraumatic.   Cardiovascular:     Rate and Rhythm: Normal rate and regular rhythm.     Heart sounds: Normal heart sounds.  Pulmonary:     Effort: Pulmonary effort is normal. No respiratory distress.     Breath sounds: Normal breath sounds.  Abdominal:     General: There is distension.     Palpations: Abdomen is soft.     Tenderness: There is no abdominal tenderness.  Musculoskeletal:        General: Normal range of motion.     Cervical back: Normal range of motion.     Comments: Trace edema in bilateral lower extremities  Skin:    General: Skin is warm and dry.  Neurological:     General: No focal deficit present.  Mental Status: She is alert.  Psychiatric:        Mood and Affect: Mood normal.        Behavior: Behavior normal.    ED Results / Procedures / Treatments   Labs (all labs ordered are listed, but only abnormal results are displayed) Labs Reviewed  BASIC METABOLIC PANEL WITH GFR - Abnormal; Notable for the following components:      Result Value   Potassium 3.3 (*)    Glucose, Bld 128 (*)    All other components within normal limits  CBC - Abnormal; Notable for the following components:   WBC 12.2 (*)    Hemoglobin 11.4 (*)    MCH 25.4 (*)    MCHC 29.2 (*)    RDW 16.9 (*)    Platelets 525 (*)    All other components within normal limits  BRAIN NATRIURETIC PEPTIDE  MAGNESIUM  TROPONIN I (HIGH SENSITIVITY)  TROPONIN I (HIGH SENSITIVITY)    EKG None  Radiology DG Chest 2 View Result Date: 02/23/2024 EXAM: 2 VIEW(S) XRAY OF THE CHEST 02/23/2024 11:04:41 PM COMPARISON: 11/10/2023 CLINICAL HISTORY: Chest pain and shortness of breath for two weeks, retaining fluid, takes lasix . FINDINGS: LUNGS AND PLEURA: Possible mild perihilar edema, equivocal. No consolidation. No pleural effusion. No pneumothorax. HEART AND MEDIASTINUM: Cardiomegaly. BONES AND SOFT TISSUES: No acute osseous abnormality. IMPRESSION: 1. Cardiomegaly with possible mild perihilar edema, equivocal.  Electronically signed by: Zadie Herter MD 02/23/2024 11:12 PM EDT RP Workstation: [id number]    Procedures Procedures    Medications Ordered in ED Medications - No data to display  ED Course/ Medical Decision Making/ A&P                                 Medical Decision Making Amount and/or Complexity of Data Reviewed Labs: ordered. Radiology: ordered.  Risk Prescription drug management. Decision regarding hospitalization.   This patient is a 63 y.o. female who presents to the ED for concern of shortness of breath, this involves an extensive number of treatment options, and is a complaint that carries with it a high risk of complications and morbidity. The emergent differential diagnosis prior to evaluation includes, but is not limited to,  CHF, pericardial effusion/tamponade, arrhythmias, ACS, COPD, asthma, bronchitis, pneumonia, pneumothorax, PE, anemia   This is not an exhaustive differential.   Past Medical History / Co-morbidities / Social History:  has a past medical history of Asthma, B12 deficiency, CHF (congestive heart failure) (HCC), COPD (chronic obstructive pulmonary disease) (HCC), Diabetes mellitus without complication (HCC), Hypertension, Morbid obesity (HCC), SOB (shortness of breath), and Urinary incontinence.  Additional history: Chart reviewed. Pertinent results include: last admission for similar complaints looks like in 5/24. Had negative CTA, admitted for diuresis. Had normal BNP at that time. Had EF 50-55% during this admission. Suspected to be related to "combination of lung disease, obesity hypoventilation syndrome, pulmonary hypertension."  Physical Exam: Physical exam performed. The pertinent findings include: abdomen does appear distended, is soft and nontender. Trace BLE edema  Lab Tests: I ordered, and personally interpreted labs.  The pertinent results include:  WBC 12.2, hgb 11.4. Troponin 7 --> 7. K 3.3. BNP WNL.   Imaging Studies: I  ordered imaging studies including CXR. I independently visualized and interpreted imaging which showed   1. Cardiomegaly with possible mild perihilar edema, equivocal   I agree with the radiologist interpretation.  Medications: I ordered medication including lasix   for SOB, concern for fluid overload. Reevaluation of the patient after these medicines showed that the patient improved. I have reviewed the patients home medicines and have made adjustments as needed.   Disposition: After consideration of the diagnostic results and the patients response to treatment, I feel that patient will require admission for continued diuresis. After Lasix , she states she is feeling some better but would like to be admitted for further diuresis. She reports good urine output with 1 dose of Lasix .  Discussed patient with hospitalist Dr. Michell Ahumada who accepts patient for admission.    I discussed this case with my attending physician Dr. Carol Chroman who cosigned this note including patient's presenting symptoms, physical exam, and planned diagnostics and interventions. Attending physician stated agreement with plan or made changes to plan which were implemented.    Final Clinical Impression(s) / ED Diagnoses Final diagnoses:  Dyspnea on exertion  Edema, unspecified type    Rx / DC Orders ED Discharge Orders     None         Ayeshia Coppin A, PA-C 02/24/24 0432    Ballard Bongo, MD 02/24/24 [phone]

## 2024-02-23 NOTE — ED Provider Notes (Incomplete)
 [place] EMERGENCY DEPARTMENT AT [facility] Provider Note   CSN: [id number] Arrival date & time: 02/23/24  2224     History {Add pertinent medical, surgical, social history, OB history to HPI:1} Chief Complaint  Patient presents with  . Chest Pain    Kathryn Mahoney is a 63 y.o. female.  Patient with history of COPD, CHF, diabetes, hypertension presents today with complaints of chest pain, shortness of breath. States that same has been ongoing for the past few weeks   Chest Pain      Home Medications Prior to Admission medications   Medication Sig Start Date End Date Taking? Authorizing Provider  acetaminophen  (TYLENOL ) 500 MG tablet Take 500 mg by mouth daily.    [provider]  albuterol  (VENTOLIN  HFA) 108 (90 Base) MCG/ACT inhaler Inhale 2 puffs into the lungs every 4 (four) hours as needed for wheezing or shortness of breath. Patient not taking: Reported on 03/30/2022 01/22/22   Guadalupe Lee, MD  amLODipine  (NORVASC ) 5 MG tablet Take 5 mg by mouth every morning. 01/07/22   [provider]  benzonatate  (TESSALON ) 100 MG capsule Take 1 capsule (100 mg total) by mouth 3 (three) times daily as needed for cough. 10/29/22   Edson Graces, MD  benzonatate  (TESSALON ) 100 MG capsule Take 1 capsule (100 mg total) by mouth every 8 (eight) hours. 11/10/23   Gigi Kyle, NP  blood glucose meter kit and supplies KIT Dispense based on patient and insurance preference. Use up to four times daily as directed. 07/25/21   Lavaughn Portland, MD  budesonide-formoterol  [facility]) 160-4.5 MCG/ACT inhaler Inhale 2 puffs into the lungs every morning. Patient not taking: Reported on 03/30/2022 01/07/22   [provider]  carvedilol  (COREG ) 6.25 MG tablet Take 1 tablet (6.25 mg total) by mouth 2 (two) times daily with a meal. Patient not taking: Reported on 01/09/2022 10/24/21   Rizwan, Saima, MD  Cyanocobalamin (B-12 PO) Take 1 tablet by mouth daily.    [provider]  diclofenac Sodium (VOLTAREN) 1 % GEL Apply 1 application  topically See admin instructions. Apply topically every morning and every evening as needed for pain 01/07/22   [provider]  ergocalciferol  (VITAMIN D2) 1.25 MG (50000 UT) capsule Take 50,000 Units by mouth once a week.    [provider]  ferrous sulfate  325 (65 FE) MG tablet Take 1 tablet (325 mg total) by mouth daily with breakfast. Patient taking differently: Take 325 mg by mouth every Wednesday. 07/26/21   Lavaughn Portland, MD  fluconazole  (DIFLUCAN ) 200 MG tablet Take 1 tablet (200 mg total) by mouth daily. 11/10/23   Gigi Kyle, NP  fluticasone  (FLONASE ) 50 MCG/ACT nasal spray Place 2 sprays into both nostrils daily. Patient taking differently: Place 2 sprays into both nostrils daily as needed for allergies or rhinitis. 02/19/22 04/25/22  Damian Duke, NP  glipiZIDE  (GLUCOTROL ) 5 MG tablet Take 5 mg by mouth daily after supper. 01/07/22   [provider]  guaiFENesin  (MUCINEX ) 600 MG 12 hr tablet Take 600 mg by mouth 2 (two) times daily as needed for to loosen phlegm or cough.    [provider]  hydrocortisone cream 1 % Apply 1 application. topically 2 (two) times daily as needed for itching.    [provider]  Magnesium Hydroxide (PHILLIPS MILK OF MAGNESIA PO) Take 60 mLs by mouth every Sunday.    [provider]  metroNIDAZOLE  (FLAGYL ) 500 MG tablet Take 1 tablet (500 mg  total) by mouth 2 (two) times daily. 07/06/23   White, Elizabeth A, PA-C  naproxen  (NAPROSYN ) 500 MG tablet Take 1 tablet (500 mg total) by mouth 2 (two) times daily. 10/29/22   Edson Graces, MD  sacubitril -valsartan  (ENTRESTO ) 24-26 MG Take 1 tablet by mouth 2 (two) times daily. 10/24/21   Rizwan, Saima, MD  torsemide  (DEMADEX ) 20 MG tablet Take 3 tablets (60 mg total) by mouth 2 (two) times daily. Patient taking differently: Take 20-40 mg by mouth See admin instructions. Take 2 tablets (40 mg) by mouth  every morning, take 1 tablet (20 mg) in the evening as needed for swelling 01/09/22 04/25/22  Arvilla Birmingham, MD      Allergies    Contrast media [iodinated contrast media], Empagliflozin, Losartan , Torsemide , Lisinopril , Metformin , and Ondansetron    Review of Systems   Review of Systems  Cardiovascular:  Positive for chest pain.    Physical Exam Updated Vital Signs BP (!) 161/76   Pulse (!) 105   Temp 99.1 F (37.3 C) (Oral)   Resp 20   Ht 5\' 9"  (1.753 m)   Wt 127 kg   SpO2 96%   BMI 41.35 kg/m  Physical Exam  ED Results / Procedures / Treatments   Labs (all labs ordered are listed, but only abnormal results are displayed) Labs Reviewed  BASIC METABOLIC PANEL WITH GFR - Abnormal; Notable for the following components:      Result Value   Potassium 3.3 (*)    Glucose, Bld 128 (*)    All other components within normal limits  CBC - Abnormal; Notable for the following components:   WBC 12.2 (*)    Hemoglobin 11.4 (*)    MCH 25.4 (*)    MCHC 29.2 (*)    RDW 16.9 (*)    Platelets 525 (*)    All other components within normal limits  BRAIN NATRIURETIC PEPTIDE  TROPONIN I (HIGH SENSITIVITY)    EKG None  Radiology DG Chest 2 View Result Date: 02/23/2024 EXAM: 2 VIEW(S) XRAY OF THE CHEST 02/23/2024 11:04:41 PM COMPARISON: 11/10/2023 CLINICAL HISTORY: Chest pain and shortness of breath for two weeks, retaining fluid, takes lasix . FINDINGS: LUNGS AND PLEURA: Possible mild perihilar edema, equivocal. No consolidation. No pleural effusion. No pneumothorax. HEART AND MEDIASTINUM: Cardiomegaly. BONES AND SOFT TISSUES: No acute osseous abnormality. IMPRESSION: 1. Cardiomegaly with possible mild perihilar edema, equivocal. Electronically signed by: Zadie Herter MD 02/23/2024 11:12 PM EDT RP Workstation: [id number]    Procedures Procedures  {Document cardiac monitor, telemetry assessment procedure when appropriate:1}  Medications Ordered in ED Medications - No data to  display  ED Course/ Medical Decision Making/ A&P   {   Click here for ABCD2, HEART and other calculatorsREFRESH Note before signing :1}                              Medical Decision Making Amount and/or Complexity of Data Reviewed Labs: ordered. Radiology: ordered.   ***  {Document critical care time when appropriate:1} {Document review of labs and clinical decision tools ie heart score, Chads2Vasc2 etc:1}  {Document your independent review of radiology images, and any outside records:1} {Document your discussion with family members, caretakers, and with consultants:1} {Document social determinants of health affecting pt's care:1} {Document your decision making why or why not admission, treatments were needed:1} Final Clinical Impression(s) / ED Diagnoses Final diagnoses:  None    Rx / DC Orders ED  Discharge Orders     None

## 2024-02-23 NOTE — ED Triage Notes (Signed)
 Chest pain and exertional sob for 2-3 weeks and fatigue. Pt reports she is retaining fluid, has not missed any doses of lasix .

## 2024-02-24 ENCOUNTER — Observation Stay (HOSPITAL_BASED_OUTPATIENT_CLINIC_OR_DEPARTMENT_OTHER)

## 2024-02-24 DIAGNOSIS — G4733 Obstructive sleep apnea (adult) (pediatric): Secondary | ICD-10-CM | POA: Diagnosis not present

## 2024-02-24 DIAGNOSIS — Z91199 Patient's noncompliance with other medical treatment and regimen due to unspecified reason: Secondary | ICD-10-CM | POA: Diagnosis not present

## 2024-02-24 DIAGNOSIS — R079 Chest pain, unspecified: Secondary | ICD-10-CM

## 2024-02-24 DIAGNOSIS — R0609 Other forms of dyspnea: Secondary | ICD-10-CM

## 2024-02-24 DIAGNOSIS — I5031 Acute diastolic (congestive) heart failure: Secondary | ICD-10-CM

## 2024-02-24 DIAGNOSIS — I10 Essential (primary) hypertension: Secondary | ICD-10-CM

## 2024-02-24 DIAGNOSIS — R002 Palpitations: Secondary | ICD-10-CM

## 2024-02-24 DIAGNOSIS — D75839 Thrombocytosis, unspecified: Secondary | ICD-10-CM | POA: Diagnosis present

## 2024-02-24 DIAGNOSIS — I5189 Other ill-defined heart diseases: Secondary | ICD-10-CM

## 2024-02-24 DIAGNOSIS — I5033 Acute on chronic diastolic (congestive) heart failure: Secondary | ICD-10-CM | POA: Diagnosis not present

## 2024-02-24 DIAGNOSIS — E876 Hypokalemia: Secondary | ICD-10-CM

## 2024-02-24 DIAGNOSIS — E8809 Other disorders of plasma-protein metabolism, not elsewhere classified: Secondary | ICD-10-CM | POA: Diagnosis present

## 2024-02-24 LAB — ECHOCARDIOGRAM COMPLETE
AR max vel: 2.51 cm2
AV Area VTI: 2.42 cm2
AV Area mean vel: 2.26 cm2
AV Mean grad: 4 mmHg
AV Peak grad: 7.1 mmHg
Ao pk vel: 1.33 m/s
Area-P 1/2: 3.16 cm2
Calc EF: 59.5 %
Height: 69 in
MV M vel: 4.98 m/s
MV Peak grad: 99 mmHg
MV VTI: 2.92 cm2
S' Lateral: 4 cm
Single Plane A2C EF: 55.8 %
Single Plane A4C EF: 61.7 %
Weight: 4480 [oz_av]

## 2024-02-24 LAB — CBC
HCT: 35 % — ABNORMAL LOW (ref 36.0–46.0)
Hemoglobin: 10.4 g/dL — ABNORMAL LOW (ref 12.0–15.0)
MCH: 25.5 pg — ABNORMAL LOW (ref 26.0–34.0)
MCHC: 29.7 g/dL — ABNORMAL LOW (ref 30.0–36.0)
MCV: 85.8 fL (ref 80.0–100.0)
Platelets: 470 10*3/uL — ABNORMAL HIGH (ref 150–400)
RBC: 4.08 MIL/uL (ref 3.87–5.11)
RDW: 16.9 % — ABNORMAL HIGH (ref 11.5–15.5)
WBC: 11.3 10*3/uL — ABNORMAL HIGH (ref 4.0–10.5)
nRBC: 0 % (ref 0.0–0.2)

## 2024-02-24 LAB — COMPREHENSIVE METABOLIC PANEL WITH GFR
ALT: 16 U/L (ref 0–44)
AST: 15 U/L (ref 15–41)
Albumin: 3.1 g/dL — ABNORMAL LOW (ref 3.5–5.0)
Alkaline Phosphatase: 86 U/L (ref 38–126)
Anion gap: 9 (ref 5–15)
BUN: 16 mg/dL (ref 8–23)
CO2: 32 mmol/L (ref 22–32)
Calcium: 8.9 mg/dL (ref 8.9–10.3)
Chloride: 99 mmol/L (ref 98–111)
Creatinine, Ser: 0.6 mg/dL (ref 0.44–1.00)
GFR, Estimated: >60 mL/min (ref >60)
Glucose, Bld: 131 mg/dL — ABNORMAL HIGH (ref 70–99)
Potassium: 3.3 mmol/L — ABNORMAL LOW (ref 3.5–5.1)
Sodium: 140 mmol/L (ref 135–145)
Total Bilirubin: 0.3 mg/dL (ref 0.0–1.2)
Total Protein: 7.4 g/dL (ref 6.5–8.1)

## 2024-02-24 LAB — IRON AND TIBC
Iron: 37 ug/dL (ref 28–170)
Saturation Ratios: 12 % (ref 10.4–31.8)
TIBC: 305 ug/dL (ref 250–450)
UIBC: 268 ug/dL

## 2024-02-24 LAB — CBG MONITORING, ED
Glucose-Capillary: 119 mg/dL — ABNORMAL HIGH (ref 70–99)
Glucose-Capillary: 149 mg/dL — ABNORMAL HIGH (ref 70–99)

## 2024-02-24 LAB — GLUCOSE, CAPILLARY
Glucose-Capillary: 134 mg/dL — ABNORMAL HIGH (ref 70–99)
Glucose-Capillary: 137 mg/dL — ABNORMAL HIGH (ref 70–99)

## 2024-02-24 LAB — HEMOGLOBIN A1C
Hgb A1c MFr Bld: 6.9 % — ABNORMAL HIGH (ref 4.8–5.6)
Mean Plasma Glucose: 151.33 mg/dL

## 2024-02-24 LAB — FERRITIN: Ferritin: 139 ng/mL (ref 11–307)

## 2024-02-24 LAB — TROPONIN I (HIGH SENSITIVITY): Troponin I (High Sensitivity): 7 ng/L (ref <18)

## 2024-02-24 MED ORDER — FERROUS SULFATE 325 (65 FE) MG PO TABS
325.0000 mg | ORAL_TABLET | Freq: Every day | ORAL | Status: DC
  Administered 2024-02-25 – 2024-02-27 (×3): 325 mg via ORAL
  Filled 2024-02-24 (×3): qty 1

## 2024-02-24 MED ORDER — ACETAMINOPHEN 650 MG RE SUPP: 650.0000 mg | Freq: Four times a day (QID) | RECTAL | Status: DC | PRN

## 2024-02-24 MED ORDER — FLUTICASONE FUROATE-VILANTEROL 200-25 MCG/ACT IN AEPB
1.0000 | INHALATION_SPRAY | Freq: Every day | RESPIRATORY_TRACT | Status: DC
  Administered 2024-02-24 – 2024-02-27 (×4): 1 via RESPIRATORY_TRACT
  Filled 2024-02-24: qty 28

## 2024-02-24 MED ORDER — MORPHINE SULFATE (PF) 4 MG/ML IV SOLN
4.0000 mg | Freq: Once | INTRAVENOUS | Status: AC
  Administered 2024-02-24: 4 mg via INTRAVENOUS
  Filled 2024-02-24: qty 1

## 2024-02-24 MED ORDER — GLIPIZIDE 10 MG PO TABS
5.0000 mg | ORAL_TABLET | Freq: Every day | ORAL | Status: DC
  Administered 2024-02-24 – 2024-02-27 (×4): 5 mg via ORAL
  Filled 2024-02-24 (×4): qty 1

## 2024-02-24 MED ORDER — KETOROLAC TROMETHAMINE 15 MG/ML IJ SOLN
15.0000 mg | Freq: Once | INTRAMUSCULAR | Status: AC
  Administered 2024-02-24: 15 mg via INTRAVENOUS
  Filled 2024-02-24: qty 1

## 2024-02-24 MED ORDER — SENNOSIDES-DOCUSATE SODIUM 8.6-50 MG PO TABS
1.0000 | ORAL_TABLET | Freq: Two times a day (BID) | ORAL | Status: DC
  Administered 2024-02-24 – 2024-02-27 (×6): 1 via ORAL
  Filled 2024-02-24 (×6): qty 1

## 2024-02-24 MED ORDER — ACETAMINOPHEN 325 MG PO TABS
650.0000 mg | ORAL_TABLET | Freq: Four times a day (QID) | ORAL | Status: DC | PRN
  Administered 2024-02-24: 650 mg via ORAL
  Filled 2024-02-24: qty 2

## 2024-02-24 MED ORDER — FUROSEMIDE 10 MG/ML IJ SOLN
40.0000 mg | Freq: Once | INTRAMUSCULAR | Status: AC
  Administered 2024-02-24: 40 mg via INTRAVENOUS
  Filled 2024-02-24: qty 4

## 2024-02-24 MED ORDER — AMLODIPINE BESYLATE 5 MG PO TABS
5.0000 mg | ORAL_TABLET | Freq: Every morning | ORAL | Status: DC
  Administered 2024-02-24 – 2024-02-25 (×2): 5 mg via ORAL
  Filled 2024-02-24 (×2): qty 1

## 2024-02-24 MED ORDER — POTASSIUM CHLORIDE CRYS ER 20 MEQ PO TBCR
40.0000 meq | EXTENDED_RELEASE_TABLET | Freq: Two times a day (BID) | ORAL | Status: AC
Start: 2024-02-24 — End: 2024-02-24
  Administered 2024-02-24 (×2): 40 meq via ORAL
  Filled 2024-02-24 (×2): qty 2

## 2024-02-24 NOTE — ED Notes (Signed)
 Patient called out and Mariah Shines [facility] went into room.  Pt loudly stated that she is a diabetic and has been here since 3 yesterday afternoon, is very hungry, and needs tylenol .   Pt also states "he" said she would be brought up to a room shortly, but "he never came back." Mariah Shines politely explained to the patient that we do not have a bed for her yet, and touched upon the process of getting one. Mariah Shines also stated she will find out about the patient's bed situation and will ask for tylenol .   Pt agreeable.  Tech in room now obtaining blood sugar

## 2024-02-24 NOTE — ED Notes (Signed)
SATURATION QUALIFICATIONS: (This note is used to comply with regulatory documentation for home oxygen)  Patient Saturations on Room Air at Rest = 95%  Patient Saturations on Room Air while Ambulating = 92%   

## 2024-02-24 NOTE — Progress Notes (Signed)
  Echocardiogram 2D Echocardiogram has been performed.  Naisha Wisdom L Rosali Augello RDCS 02/24/2024, 3:32 PM

## 2024-02-24 NOTE — ED Notes (Signed)
Pt given graham crackers and cranberry juice per request.

## 2024-02-24 NOTE — H&P (Signed)
 History and Physical    Patient: Kathryn Mahoney BJY:[id number] DOB: [birth year] DOA: 02/23/2024 DOS: the patient was seen and examined on 02/24/2024 PCP: [facility]  Patient coming from: Home  Chief Complaint:  Chief Complaint  Patient presents with   Chest Pain   HPI: Kathryn Mahoney is a 63 y.o. female with medical history significant of Asthma, B12 deficiency, class III obesity, stage I diastolic dysfunction, COPD, type 2 diabetes, hypertension, frequent dyspnea, urinary incontinence who presented the emergency department with a history of 2 to 3 weeks of dyspnea on exertion associated with chest pain that feels like a fluttering sensation.  She has been using garlic and onion salt to cook.  I have advised her that she needs to use regular fresh garlic or onion, or the powder form that does not contain salt.   No diaphoresis, PND, orthopnea, but gets frequent pitting edema of the lower extremities.  She denied fever, chills, rhinorrhea, sore throat, wheezing or hemoptysis.  No abdominal pain, nausea, emesis, diarrhea, melena or hematochezia.  No flank pain, dysuria, frequency or hematuria.  No polyuria, polydipsia, polyphagia or blurred vision.   Lab work: CBC showed white count 12.2, hemoglobin 11.4 g/dL and platelets 086.  BMP showed a potassium of 3.3 mmol/L and a glucose of 128 mg/dL.  Troponin x 2 and BNP were normal.  Magnesium 1.9 mg/dL.  Imaging: 2 view chest radiograph showing cardiomegaly with possible mild perihilar edema, equivocal.  ED course: Initial vital signs were temperature 99.1 F, pulse 95, respiration 20, BP 161/76 mmHg O2 sat 96% on room air.  Patient received morphine  4 mg IVP and KCl 40 mEq p.o. x 1.  The patient was ordered furosemide  40 mg IV in early morning, but he was not given.   Review of Systems: As mentioned in the history of present illness. All other systems reviewed and are negative.  Past Medical History:  Diagnosis Date   Asthma    B12  deficiency    CHF (congestive heart failure) (HCC)    COPD (chronic obstructive pulmonary disease) (HCC)    Diabetes mellitus without complication (HCC)    Hypertension    Morbid obesity (HCC)    SOB (shortness of breath)    Urinary incontinence    Past Surgical History:  Procedure Laterality Date   CESAREAN SECTION     ECTOPIC PREGNANCY SURGERY     KNEE SURGERY     Social History:  reports that she quit smoking about 13 years ago. Her smoking use included cigarettes. She started smoking about 43 years ago. She has a 45 pack-year smoking history. She has never used smokeless tobacco. She reports that she does not drink alcohol  and does not use drugs.  Allergies  Allergen Reactions   Contrast Media [Iodinated Contrast Media] Other (See Comments)    Coughing, sick to stomach, increase blood pressure    Empagliflozin Other (See Comments)    Yeast infection (reaction to Jardiance)   Losartan  Other (See Comments)    Back pain   Torsemide  Itching    Pt takes this medication because nothing else works ( takes cortisone cream for the itching)   Lisinopril  Nausea And Vomiting   Metformin  Palpitations   Ondansetron Swelling    Family History  Problem Relation Age of Onset   Stroke Mother    Asthma Mother     Prior to Admission medications   Medication Sig Start Date End Date Taking? Authorizing Provider  acetaminophen  (TYLENOL ) 500 MG tablet Take  500 mg by mouth daily.   Yes [provider]  albuterol  (VENTOLIN  HFA) 108 (90 Base) MCG/ACT inhaler Inhale 2 puffs into the lungs every 4 (four) hours as needed for wheezing or shortness of breath. 01/22/22  Yes Steinl, Kevin, MD  allopurinol (ZYLOPRIM) 100 MG tablet Take 100 mg by mouth daily as needed.   Yes [provider]  amLODipine  (NORVASC ) 5 MG tablet Take 5 mg by mouth every morning. 01/07/22  Yes [provider]  budesonide-formoterol  (SYMBICORT) 160-4.5 MCG/ACT inhaler Inhale 2 puffs into the lungs every  morning. 01/07/22  Yes [provider]  Cyanocobalamin (B-12 PO) Take 1 tablet by mouth daily.   Yes [provider]  diclofenac Sodium (VOLTAREN) 1 % GEL Apply 1 application  topically See admin instructions. Apply topically every morning and every evening as needed for pain 01/07/22  Yes [provider]  ergocalciferol  (VITAMIN D2) 1.25 MG (50000 UT) capsule Take 50,000 Units by mouth once a week.   Yes [provider]  ferrous sulfate  325 (65 FE) MG tablet Take 1 tablet (325 mg total) by mouth daily with breakfast. 07/26/21  Yes Lavaughn Portland, MD  glipiZIDE  (GLUCOTROL ) 5 MG tablet Take 5 mg by mouth daily. 01/07/22  Yes [provider]  guaiFENesin  (MUCINEX ) 600 MG 12 hr tablet Take 600 mg by mouth 2 (two) times daily as needed for to loosen phlegm or cough.   Yes [provider]  hydrocortisone cream 1 % Apply 1 application. topically 2 (two) times daily as needed for itching.   Yes [provider]  OZEMPIC, 2 MG/DOSE, 8 MG/3ML SOPN Inject 2 mg into the skin once a week. 02/10/24  Yes [provider]  Potassium 99 MG TABS Take 1 tablet by mouth once a week.   Yes [provider]  benzonatate  (TESSALON ) 100 MG capsule Take 1 capsule (100 mg total) by mouth 3 (three) times daily as needed for cough. Patient not taking: Reported on 02/24/2024 10/29/22   Edson Graces, MD  blood glucose meter kit and supplies KIT Dispense based on patient and insurance preference. Use up to four times daily as directed. 07/25/21   Lavaughn Portland, MD  carvedilol  (COREG ) 6.25 MG tablet Take 1 tablet (6.25 mg total) by mouth 2 (two) times daily with a meal. Patient not taking: Reported on 01/09/2022 10/24/21   Rizwan, Saima, MD  fluticasone  (FLONASE ) 50 MCG/ACT nasal spray Place 2 sprays into both nostrils daily. Patient taking differently: Place 2 sprays into both nostrils daily as needed for allergies or rhinitis. 02/19/22 04/25/22  Damian Duke, NP   naproxen  (NAPROSYN ) 500 MG tablet Take 1 tablet (500 mg total) by mouth 2 (two) times daily. Patient not taking: Reported on 02/24/2024 10/29/22   Edson Graces, MD  sacubitril -valsartan  (ENTRESTO ) 24-26 MG Take 1 tablet by mouth 2 (two) times daily. Patient not taking: Reported on 02/24/2024 10/24/21   Rizwan, Saima, MD  torsemide  (DEMADEX ) 20 MG tablet Take 3 tablets (60 mg total) by mouth 2 (two) times daily. Patient taking differently: Take 20-40 mg by mouth See admin instructions. Take 2 tablets (40 mg) by mouth every morning, take 1 tablet (20 mg) in the evening as needed for swelling 01/09/22 04/25/22  Arvilla Birmingham, MD    Physical Exam: Vitals:   02/24/24 0508 02/24/24 0600 02/24/24 0630 02/24/24 0700  BP:  129/73 136/84 (!) 142/78  Pulse:  94 90 87  Resp: 18 12 11 16   Temp: 98.4  F (36.9 C)     TempSrc: Oral     SpO2:  99% 98% 95%  Weight:      Height:       Physical Exam Constitutional:      General: She is awake. She is not in acute distress.    Appearance: She is morbidly obese. She is ill-appearing.  HENT:     Head: Normocephalic.     Nose: No rhinorrhea.     Mouth/Throat:     Mouth: Mucous membranes are moist.  Eyes:     General: No scleral icterus.    Pupils: Pupils are equal, round, and reactive to light.  Neck:     Vascular: No JVD.  Cardiovascular:     Rate and Rhythm: Normal rate and regular rhythm.  Pulmonary:     Breath sounds: No wheezing or rhonchi.  Abdominal:     General: Bowel sounds are normal. There is no distension.     Palpations: Abdomen is soft.     Tenderness: There is no abdominal tenderness. There is no guarding.  Musculoskeletal:     Cervical back: Neck supple.     Right lower leg: No edema.     Left lower leg: No edema.  Skin:    General: Skin is warm and dry.  Neurological:     General: No focal deficit present.     Mental Status: She is alert and oriented to person, place, and time.  Psychiatric:        Mood and Affect:  Mood normal.        Behavior: Behavior normal. Behavior is cooperative.     Data Reviewed:  Results are pending, will review when available.  07/22/2021 Echocardiogram report. IMPRESSIONS:   1. Left ventricular ejection fraction, by estimation, is 50 to 55%. Left  ventricular ejection fraction by 2D MOD biplane is 53.5 %. The left  ventricle has low normal function. The left ventricle has no regional wall  motion abnormalities. There is  moderate left ventricular hypertrophy. Left ventricular diastolic  parameters are consistent with Grade I diastolic dysfunction (impaired  relaxation). LV filling pressure apperas normal.   2. Right ventricular systolic function is normal. The right ventricular  size is normal. There is mildly elevated pulmonary artery systolic  pressure. The estimated right ventricular systolic pressure is 36.7 mmHg.   3. The mitral valve is abnormal. The leaflet tips are sclerotic. Mild to  moderate mitral valve regurgitation. There is mild late systolic prolapse  of of the mitral valve. (Image 95)   4. The aortic valve is tricuspid. Aortic valve regurgitation is not  visualized.   5. The inferior vena cava is dilated in size with >50% respiratory  variability, suggesting right atrial pressure of 8 mmHg.   EKG: Vent. rate 86 BPM PR interval 174 ms QRS duration 118 ms QT/QTcB 397/475 ms P-R-T axes 80 76 65 Sinus rhythm Consider left atrial enlargement Nonspecific intraventricular conduction delayr  Assessment and Plan: Principal Problem:   Chest pain Associated with dyspnea on exertion In the patient with history of:   Nonrheumatic mitral valve regurgitation And:   Grade I diastolic dysfunction She has not been taking carvedilol , Entresto  or spironolactone . Observation/telemetry. Supplemental oxygen as needed. Sodium and fluid restriction. Continue furosemide  40 mg IVP daily. Monitor daily weights, intake and output. Monitor renal function  electrolytes. Check echocardiogram. Cardiology consult appreciated.  Active Problems:   COPD (chronic obstructive pulmonary disease) (HCC) Continue supplemental oxygen. Bronchodilators as needed.  Hypertension Continue amlodipine  5 mg p.o. daily.    Type 2 diabetes mellitus with obesity (HCC) Carbohydrate modified diet. Continue glipizide  5 mg p.o. daily. CBG monitoring before meals and bedtime. -If needed, begin RI SS. Check hemoglobin A1c.    Obesity, Class III, BMI 40-49.9 (morbid obesity) (HCC) Current BMI 41.35 kg/m. May have obesity hypoventilation. Would benefit from lifestyle modifications. Follow-up closely with PCP and/or bariatric clinic.    OSA (obstructive sleep apnea) CPAP at bedtime.    Hypokalemia Replacing. Follow potassium level.    Hypoalbuminemia Recheck albumin in AM.    Thrombocytosis Secondary to:   Iron deficiency anemia Follow CBC in the morning.    Back pain Toradol  15 mg IVP x 1. Avoid sedating meds.    Advance Care Planning:   Code Status: Full Code   Consults: Cardiology Gaylyn Keas, MD).  Family Communication:   Severity of Illness: The appropriate patient status for this patient is OBSERVATION. Observation status is judged to be reasonable and necessary in order to provide the required intensity of service to ensure the patient's safety. The patient's presenting symptoms, physical exam findings, and initial radiographic and laboratory data in the context of their medical condition is felt to place them at decreased risk for further clinical deterioration. Furthermore, it is anticipated that the patient will be medically stable for discharge from the hospital within 2 midnights of admission.   Author: Danice Dural, MD 02/24/2024 7:56 AM  For on call review [email or web address].   This document was prepared using Dragon voice recognition software and may contain some unintended transcription errors.

## 2024-02-24 NOTE — Consult Note (Signed)
 Cardiology Consultation   Patient ID: Kathryn Mahoney MRN: [id number]; DOB: [birth year]  Admit date: 02/23/2024 Date of Consult: 02/24/2024  PCP:  [facility]   [facility] Providers Cardiologist:  Sheryle Donning, MD     Patient Profile:   Kathryn Mahoney is a 63 y.o. female with a hx of chronic diastolic heart failure, morbid obesity, HTN, OSA on CPAP, pulmonary HTN, type 2 DM, COPD, asthma who is being seen 02/24/2024 for the evaluation of chest pain, shortness of breath at the request of Dr. Bonita Bussing.  History of Present Illness:   Kathryn Mahoney is a 63 year old female with above medical history. Per chart review, patient is predominantly followed by [facility] but has intermittently seen providers within [facility]. She [facility] established with [facility] when she was admitted in 08/2020 with acute on chronic diastolic heart failure. She underwent echocardiogram 09/04/20 that showed EF 50-55%, no regional wall motion abnormalities, normal RV systolic function, moderate MR (possibly severe). Echo was reviewed by Dr. Veryl Gottron, felt that MR was moderate and was not significantly contributing to her respiratory symptoms. Decision was made to not pursue TEE due to elevated risk. It was recommended that she undergo RHC to further evaluate the etiology of pulmonary HTN, but patient refused. She was seen in clinic on 09/17/20 and she was doing well on torsemide . Declined adding SGLT2i.   Later admitted in 06/2021 with shortness of breath. Reportedly had not taken her lasix  for 3 days. She underwent echocardiogram on 07/22/21 that showed EF 50-55%, no regional wall motion abnormalities, grade I DD, normal RV systolic function, mildly elevated PA systolic pressure, mild-moderate MR. She was treated with IV lasix . Remained on carvedilol , entresto . Not started on SGLT2i due to history of GU infections. She refused to resume spironolactone  due to back pain.   Appears that  patient has not been followed by cardiology as an outpatient since that time. There are some phone notes from [facility] but there are no office appointments.   Patient presented to the ED on 4/29 complaining of chest pain and shortness of breath for 2-3 weeks. Reportedly felt like she was retaining fluid. Labs in the ED significant for K 3.3, creatinine 0.80, mag 1.9, WBC 12.2, hemoglobin 11.4, platelets 525. hsTn negative x2. BNP 51.8. CXR showed cardiomegaly with possible mild perihilar edema. Patient was given a dose of IV lasix  40 mg. Cardiology consulted for chest pain and CHF.   On interview, patient denies having any chest pain.  Occasionally has feelings of palpitations/fluttering in her chest.  Reports that for the past 3-4 weeks, she has been feeling generally unwell.  She has been feeling fatigued and has been gaining weight.  He reports that she is on Ozempic and had been losing weight, then started to regain some of her weight.  Started to feel like she was having bloating in her abdomen.  Reported having episodes of shortness of breath.  Her shortness of breath was worse with exertion.  She denies orthopnea, but she does not usually lay flat due to discomfort.  Denies lower extremity swelling.  She reports being intolerant or having side effects to many medicines in the past.  She stopped taking her carvedilol  and spironolactone  because she was concerned it was contributing to her back pain.  She sees pain management for knee pain and has an appointment to discuss her back pain.  Reports feeling "stiff" on metoprolol .  Reports feeling not well on Jardiance.  Past Medical History:  Diagnosis Date   Asthma    B12 deficiency    CHF (congestive heart failure) (HCC)    COPD (chronic obstructive pulmonary disease) (HCC)    Diabetes mellitus without complication (HCC)    Hypertension    Morbid obesity (HCC)    SOB (shortness of breath)    Urinary incontinence     Past Surgical  History:  Procedure Laterality Date   CESAREAN SECTION     ECTOPIC PREGNANCY SURGERY     KNEE SURGERY         Inpatient Medications: Scheduled Meds:  furosemide   40 mg Intravenous Once   Continuous Infusions:  PRN Meds: acetaminophen  **OR** acetaminophen   Allergies:    Allergies  Allergen Reactions   Contrast Media [Iodinated Contrast Media] Other (See Comments)    Coughing, sick to stomach, increase blood pressure    Empagliflozin Other (See Comments)    Yeast infection (reaction to Jardiance)   Losartan  Other (See Comments)    Back pain   Torsemide  Itching    Pt takes this medication because nothing else works ( takes cortisone cream for the itching)   Lisinopril  Nausea And Vomiting   Metformin  Palpitations   Ondansetron Swelling    Social History:   Social History   Socioeconomic History   Marital status: Divorced    Spouse name: Not on file   Number of children: 4   Years of education: Not on file   Highest education level: Not on file  Occupational History   Not on file  Tobacco Use   Smoking status: Former    Current packs/day: 0.00    Average packs/day: 1.5 packs/day for 30.0 years (45.0 ttl pk-yrs)    Types: Cigarettes    Start date: 10/27/1980    Quit date: 10/27/2010    Years since quitting: 13.3   Smokeless tobacco: Never  Vaping Use   Vaping status: Never Used  Substance and Sexual Activity   Alcohol  use: No   Drug use: No   Sexual activity: Not Currently  Other Topics Concern   Not on file  Social History Narrative   Not on file   Social Drivers of Health   Financial Resource Strain: Low Risk  (03/07/2023)   Received from [facility]   Overall Financial Resource Strain (CARDIA)    Difficulty of Paying Living Expenses: Not hard at all  Food Insecurity: No Food Insecurity (03/07/2023)   [facility]   Hunger Vital Sign    Worried About Running Out of Food in the Last Year: Never true    Ran Out of Food in the Last  Year: Never true  Transportation Needs: No Transportation Needs (03/07/2023)   Received from [facility]   PRAPARE - Transportation    Lack of Transportation (Medical): No    Lack of Transportation (Non-Medical): No  Physical Activity: Patient Declined (03/07/2023)   Received from [facility]   Exercise Vital Sign    Days of Exercise per Week: Patient declined    Minutes of Exercise per Session: Patient declined  Stress: No Stress Concern Present (03/07/2023)   Received from [facility] - Occupational Stress Questionnaire    Feeling of Stress : Not at all  Social Connections: Patient Declined (03/07/2023)   Received from [facility]   Social Connection and Isolation Panel [NHANES]    Frequency of Communication with Friends and Family: Patient declined  Frequency of Social Gatherings with Friends and Family: Patient declined    Attends Religious Services: Patient declined    Active Member of Clubs or Organizations: Patient declined    Attends Banker Meetings: Patient declined    Marital Status: Patient declined  Intimate Partner Violence: Patient Declined (03/07/2023)   Received from [facility]   Humiliation, Afraid, Rape, and Kick questionnaire    Fear of Current or Ex-Partner: Patient declined    Emotionally Abused: Patient declined    Physically Abused: Patient declined    Sexually Abused: Patient declined    Family History:    Family History  Problem Relation Age of Onset   Stroke Mother    Asthma Mother      ROS:  Please see the history of present illness.   All other ROS reviewed and negative.     Physical Exam/Data:   Vitals:   02/24/24 0508 02/24/24 0600 02/24/24 0630 02/24/24 0700  BP:  129/73 136/84 (!) 142/78  Pulse:  94 90 87  Resp: 18 12 11 16   Temp: 98.4 F (36.9 C)     TempSrc: Oral     SpO2:  99% 98% 95%  Weight:      Height:       No intake or output data in the 24  hours ending 02/24/24 0900    02/23/2024   10:30 PM 11/10/2023    3:37 PM 10/29/2022    1:32 PM  Last 3 Weights  Weight (lbs) 280 lb 280 lb 288 lb 12.8 oz  Weight (kg) 127.007 kg 127.007 kg 131 kg     Body mass index is 41.35 kg/m.  General: Obese female, laying in bed with head elevated.  No acute distress HEENT: normal Neck: no JVD Vascular: Radial pulses 2+ bilaterally Cardiac:  normal S1, S2; RRR; no murmur Lungs: Distant breath sounds but clear to auscultation bilaterally, no wheezing, rhonchi or rales.  Normal work of breathing on nasal cannula Abd: soft, nontender Ext: no edema in bilateral lower extremities Musculoskeletal:  No deformities Skin: warm and dry  Neuro:  no focal abnormalities noted Psych:  Normal affect   EKG:  The EKG was personally reviewed and demonstrates: EKG not yet uploaded into the system Telemetry:  Telemetry was personally reviewed and demonstrates: Normal sinus rhythm  Relevant CV Studies: Cardiac Studies & Procedures   ______________________________________________________________________________________________     ECHOCARDIOGRAM  ECHOCARDIOGRAM COMPLETE 07/22/2021  Narrative ECHOCARDIOGRAM REPORT    Patient Name:   LAIN BUSTAMANTE Date of Exam: 07/22/2021 Medical Rec #:  [id number]       Height:       69.0 in Accession #:    [id number]      Weight:       311.5 lb Date of Birth:  [birth year]       BSA:          2.494 m Patient Age:    60 years        BP:           170/101 mmHg Patient Gender: F               HR:           81 bpm. Exam Location:  Inpatient  Procedure: 2D Echo, Cardiac Doppler and Color Doppler  Indications:    R06.02 SOB  History:        Patient has prior history of Echocardiogram examinations, most recent 09/04/2020. CHF, COPD, Mitral Valve Disease; Risk  Factors:Diabetes. Mitral regurgitation.  Sonographer:    Raynelle Callow RDCS Referring Phys: [id number] [facility]   Sonographer Comments: Technically difficult  study due to poor echo windows and patient is morbidly obese. Image acquisition challenging due to patient body habitus. Patient talking throughout exam. IMPRESSIONS   1. Left ventricular ejection fraction, by estimation, is 50 to 55%. Left ventricular ejection fraction by 2D MOD biplane is 53.5 %. The left ventricle has low normal function. The left ventricle has no regional wall motion abnormalities. There is moderate left ventricular hypertrophy. Left ventricular diastolic parameters are consistent with Grade I diastolic dysfunction (impaired relaxation). LV filling pressure apperas normal. 2. Right ventricular systolic function is normal. The right ventricular size is normal. There is mildly elevated pulmonary artery systolic pressure. The estimated right ventricular systolic pressure is 36.7 mmHg. 3. The mitral valve is abnormal. The leaflet tips are sclerotic. Mild to moderate mitral valve regurgitation. There is mild late systolic prolapse of of the mitral valve. (Image 95) 4. The aortic valve is tricuspid. Aortic valve regurgitation is not visualized. 5. The inferior vena cava is dilated in size with >50% respiratory variability, suggesting right atrial pressure of 8 mmHg.  Comparison(s): No significant change from prior study. 09/04/2020: LVEF 50-55%, moderate MR.  FINDINGS Left Ventricle: Left ventricular ejection fraction, by estimation, is 50 to 55%. Left ventricular ejection fraction by 2D MOD biplane is 53.5 %. The left ventricle has low normal function. The left ventricle has no regional wall motion abnormalities. The left ventricular internal cavity size was normal in size. There is moderate left ventricular hypertrophy. Left ventricular diastolic parameters are consistent with Grade I diastolic dysfunction (impaired relaxation). Normal left ventricular filling pressure.  Right Ventricle: The right ventricular size is normal. No increase in right ventricular wall thickness. Right  ventricular systolic function is normal. There is mildly elevated pulmonary artery systolic pressure. The tricuspid regurgitant velocity is 2.68 m/s, and with an assumed right atrial pressure of 8 mmHg, the estimated right ventricular systolic pressure is 36.7 mmHg.  Left Atrium: Left atrial size was normal in size.  Right Atrium: Right atrial size was normal in size.  Pericardium: There is no evidence of pericardial effusion.  Mitral Valve: The mitral valve is abnormal. There is mild late systolic prolapse of of the mitral valve. There is mild thickening of the mitral valve leaflet(s). Mild to moderate mitral valve regurgitation. MV peak gradient, 6.7 mmHg. The mean mitral valve gradient is 3.0 mmHg.  Tricuspid Valve: The tricuspid valve is grossly normal. Tricuspid valve regurgitation is trivial.  Aortic Valve: The aortic valve is tricuspid. Aortic valve regurgitation is not visualized.  Pulmonic Valve: The pulmonic valve was normal in structure. Pulmonic valve regurgitation is not visualized.  Aorta: The aortic root and ascending aorta are structurally normal, with no evidence of dilitation.  Venous: The inferior vena cava is dilated in size with greater than 50% respiratory variability, suggesting right atrial pressure of 8 mmHg.  IAS/Shunts: No atrial level shunt detected by color flow Doppler.   LEFT VENTRICLE PLAX 2D                        Biplane EF (MOD) LVIDd:         5.00 cm         LV Biplane EF:   Left LVIDs:         3.60 cm  ventricular LV PW:         1.40 cm                          ejection LV IVS:        1.80 cm                          fraction by LVOT diam:     2.10 cm                          2D MOD LV SV:         67                               biplane is LV SV Index:   27                               53.5 %. LVOT Area:     3.46 cm Diastology LV e' medial:    6.09 cm/s LV Volumes (MOD)               LV E/e' medial:  18.9 LV vol d,  MOD    172.0 ml      LV e' lateral:   5.44 cm/s A2C:                           LV E/e' lateral: 21.1 LV vol d, MOD    159.0 ml A4C: LV vol s, MOD    63.8 ml A2C: LV vol s, MOD    92.6 ml A4C: LV SV MOD A2C:   108.2 ml LV SV MOD A4C:   159.0 ml LV SV MOD BP:    90.5 ml  RIGHT VENTRICLE             IVC RV S prime:     12.20 cm/s  IVC diam: 2.50 cm TAPSE (M-mode): 2.6 cm  LEFT ATRIUM             Index       RIGHT ATRIUM          Index LA diam:        3.50 cm 1.40 cm/m  RA Area:     8.04 cm LA Vol (A2C):   56.3 ml 22.57 ml/m RA Volume:   10.70 ml 4.29 ml/m LA Vol (A4C):   71.9 ml 28.81 ml/m LA Biplane Vol: 59.8 ml 23.98 ml/m AORTIC VALVE LVOT Vmax:   121.00 cm/s LVOT Vmean:  83.100 cm/s LVOT VTI:    0.192 m  AORTA Ao Root diam: 3.30 cm Ao Asc diam:  3.60 cm  MITRAL VALVE                 TRICUSPID VALVE MV Area (PHT): 5.13 cm      TR Peak grad:   28.7 mmHg MV Area VTI:   2.36 cm      TR Vmax:        268.00 cm/s MV Peak grad:  6.7 mmHg MV Mean grad:  3.0 mmHg      SHUNTS MV Vmax:       1.29 m/s      Systemic VTI:  0.19 m MV Vmean:  77.6 cm/s     Systemic Diam: 2.10 cm MV Decel Time: 148 msec MR Peak grad:    143.5 mmHg MR Mean grad:    96.0 mmHg MR Vmax:         599.00 cm/s MR Vmean:        459.0 cm/s MR PISA:         1.27 cm MR PISA Eff ROA: 7 mm MR PISA Radius:  0.45 cm MV E velocity: 115.00 cm/s MV A velocity: 141.00 cm/s MV E/A ratio:  0.82  Kathryn Franco MD Electronically signed by Kathryn Franco MD Signature Date/Time: 07/22/2021/4:27:18 PM    Final          ______________________________________________________________________________________________       Laboratory Data:  High Sensitivity Troponin:   Recent Labs  Lab 02/23/24 2256 02/24/24 0109  TROPONINIHS 7 7     Chemistry Recent Labs  Lab 02/23/24 2256  NA 141  K 3.3*  CL 100  CO2 31  GLUCOSE 128*  BUN 18  CREATININE 0.80  CALCIUM 9.4  MG 1.9  GFRNONAA >60   ANIONGAP 10    No results for input(s): "PROT", "ALBUMIN", "AST", "ALT", "ALKPHOS", "BILITOT" in the last 168 hours. Lipids No results for input(s): "CHOL", "TRIG", "HDL", "LABVLDL", "LDLCALC", "CHOLHDL" in the last 168 hours.  Hematology Recent Labs  Lab 02/23/24 2256  WBC 12.2*  RBC 4.49  HGB 11.4*  HCT 39.0  MCV 86.9  MCH 25.4*  MCHC 29.2*  RDW 16.9*  PLT 525*   Thyroid No results for input(s): "TSH", "FREET4" in the last 168 hours.  BNP Recent Labs  Lab 02/23/24 2258  BNP 51.8    DDimer No results for input(s): "DDIMER" in the last 168 hours.   Radiology/Studies:  DG Chest 2 View Result Date: 02/23/2024 EXAM: 2 VIEW(S) XRAY OF THE CHEST 02/23/2024 11:04:41 PM COMPARISON: 11/10/2023 CLINICAL HISTORY: Chest pain and shortness of breath for two weeks, retaining fluid, takes lasix . FINDINGS: LUNGS AND PLEURA: Possible mild perihilar edema, equivocal. No consolidation. No pleural effusion. No pneumothorax. HEART AND MEDIASTINUM: Cardiomegaly. BONES AND SOFT TISSUES: No acute osseous abnormality. IMPRESSION: 1. Cardiomegaly with possible mild perihilar edema, equivocal. Electronically signed by: Zadie Herter MD 02/23/2024 11:12 PM EDT RP Workstation: [id number]     Assessment and Plan:   Acute on chronic diastolic heart failure  Medication non adherence  - Patient has history of chronic diastolic heart failure, previously followed by cardiology through [facility].  Reports she has not seen a cardiologist in a very long time - PTA, patient was on torsemide  40 mg every AM and an additional 20 mg in the PM as needed for swelling. Also was prescribed entresto  24-26 mg BID, carvedilol  6.25 mg BID, but was not been taking.  She reports having side effects with many medications in the past which has limited her medical therapy.  She reportedly stopped carvedilol  and Entresto  because she was concerned it was worsening her back pain.  Reports she stopped spironolactone  in the past due to  back pain as well.  Had yeast infections with SGLT2 inhibitors - Patient has chronic pain in her knees and sees a chronic pain specialist. Back pain was relieved with morphine  in the ED.  I suspect her back pain is musculoskeletal.  I do not think that her spironolactone , Entresto , or carvedilol  are contributing to her back pain.  We discussed this and patient is willing to retry spironolactone  and carvedilol .  She is hesitant to start multiple medications  at the same time because she wants to be able to pick out what medication is causing side effects - Start spironolactone  12.5 mg daily today  - Patient reports episodes of shortness of breath and abdominal bloating. Symptoms are somewhat vague.  Her body habitus makes it difficult to assess volume status.  Her ankles are thin, lungs clear.  BNP 51.8.  She was given IV Lasix  40 mg overnight, did not note improvement in symptoms. I suspect her symptoms are multifactorial- weight is likely contributing. Also has history of COPD  - Continue IV lasix  for now, monitor strict I/Os, daily weights, BMPs to follow renal function  - Echo pending   HTN  - Continue amlodipine  5 mg daily  - Start spironolactone  12.5 mg daily   Mitral Regurgitation  - Mild-moderate on echo in 06/2021 - Echo pending this admission   Palpitations  - Patient reports occasional fluttering in her chest. Per tele, she is maintaining NSR  - Continue to monitor on telemetry  - Consider monitor at DC  - Plan to resume carvedilol  tomorrow. Discussed other BB, but patient prefers carvedilol . As above, patient prefers to start one new medication at a time   Otherwise per primary  - OSA- reports compliance with CPAP  - Morbid Obesity - BMI 41. On ozempic - Type 2 DM  - Back pain  - Hypokalemia-  supplementation ordered    Risk Assessment/Risk Scores:    [place]  Heart Association (NYHA) Functional Class NYHA Class II     For questions or updates, please contact   [facility] Please consult [email or web address] for contact info under    Signed, Debria Fang, PA-C  02/24/2024 9:00 AM

## 2024-02-24 NOTE — Progress Notes (Signed)
 Patient requested dose of IV Lasix , patient told primary RN that she thought the plan of care for her would include IV Lasix  twice daily and she only received a single dose, RN reached out to MD Bonita Bussing, MD Bonita Bussing made aware of situation and requested primary RN give dose that was ordered for ED use, primary RN administered medication per MD order.

## 2024-02-25 DIAGNOSIS — I340 Nonrheumatic mitral (valve) insufficiency: Secondary | ICD-10-CM | POA: Diagnosis not present

## 2024-02-25 DIAGNOSIS — I5033 Acute on chronic diastolic (congestive) heart failure: Principal | ICD-10-CM

## 2024-02-25 DIAGNOSIS — R002 Palpitations: Secondary | ICD-10-CM

## 2024-02-25 DIAGNOSIS — I10 Essential (primary) hypertension: Secondary | ICD-10-CM | POA: Diagnosis not present

## 2024-02-25 LAB — BASIC METABOLIC PANEL WITH GFR
Anion gap: 10 (ref 5–15)
BUN: 19 mg/dL (ref 8–23)
CO2: 28 mmol/L (ref 22–32)
Calcium: 8.9 mg/dL (ref 8.9–10.3)
Chloride: 99 mmol/L (ref 98–111)
Creatinine, Ser: 0.6 mg/dL (ref 0.44–1.00)
GFR, Estimated: >60 mL/min (ref >60)
Glucose, Bld: 114 mg/dL — ABNORMAL HIGH (ref 70–99)
Potassium: 4.1 mmol/L (ref 3.5–5.1)
Sodium: 137 mmol/L (ref 135–145)

## 2024-02-25 LAB — GLUCOSE, CAPILLARY
Glucose-Capillary: 127 mg/dL — ABNORMAL HIGH (ref 70–99)
Glucose-Capillary: 109 mg/dL — ABNORMAL HIGH (ref 70–99)
Glucose-Capillary: 112 mg/dL — ABNORMAL HIGH (ref 70–99)
Glucose-Capillary: 94 mg/dL (ref 70–99)

## 2024-02-25 LAB — CBC
HCT: 37 % (ref 36.0–46.0)
Hemoglobin: 10.7 g/dL — ABNORMAL LOW (ref 12.0–15.0)
MCH: 25.5 pg — ABNORMAL LOW (ref 26.0–34.0)
MCHC: 28.9 g/dL — ABNORMAL LOW (ref 30.0–36.0)
MCV: 88.1 fL (ref 80.0–100.0)
Platelets: 456 10*3/uL — ABNORMAL HIGH (ref 150–400)
RBC: 4.2 MIL/uL (ref 3.87–5.11)
RDW: 16.9 % — ABNORMAL HIGH (ref 11.5–15.5)
WBC: 11.1 10*3/uL — ABNORMAL HIGH (ref 4.0–10.5)
nRBC: 0 % (ref 0.0–0.2)

## 2024-02-25 LAB — HIV ANTIBODY (ROUTINE TESTING W REFLEX): HIV Screen 4th Generation wRfx: NONREACTIVE

## 2024-02-25 MED ORDER — SACUBITRIL-VALSARTAN 24-26 MG PO TABS
1.0000 | ORAL_TABLET | Freq: Two times a day (BID) | ORAL | Status: DC
  Administered 2024-02-25 – 2024-02-27 (×5): 1 via ORAL
  Filled 2024-02-25 (×5): qty 1

## 2024-02-25 MED ORDER — FUROSEMIDE 10 MG/ML IJ SOLN
40.0000 mg | Freq: Two times a day (BID) | INTRAMUSCULAR | Status: DC
  Administered 2024-02-25: 40 mg via INTRAVENOUS
  Filled 2024-02-25: qty 4

## 2024-02-25 MED ORDER — FUROSEMIDE 10 MG/ML IJ SOLN
40.0000 mg | Freq: Once | INTRAMUSCULAR | Status: AC
  Administered 2024-02-25: 40 mg via INTRAVENOUS
  Filled 2024-02-25: qty 4

## 2024-02-25 MED ORDER — CARVEDILOL 3.125 MG PO TABS
3.1250 mg | ORAL_TABLET | Freq: Two times a day (BID) | ORAL | Status: DC
  Administered 2024-02-25: 3.125 mg via ORAL
  Filled 2024-02-25 (×2): qty 1

## 2024-02-25 MED ORDER — POLYETHYLENE GLYCOL 3350 17 G PO PACK
17.0000 g | PACK | Freq: Every day | ORAL | Status: DC
  Administered 2024-02-25 – 2024-02-27 (×3): 17 g via ORAL
  Filled 2024-02-25 (×3): qty 1

## 2024-02-25 MED ORDER — LIDOCAINE 5 % EX PTCH
1.0000 | MEDICATED_PATCH | CUTANEOUS | Status: DC
  Administered 2024-02-25 – 2024-02-27 (×3): 1 via TRANSDERMAL
  Filled 2024-02-25 (×3): qty 1

## 2024-02-25 MED ORDER — ENOXAPARIN SODIUM 40 MG/0.4ML IJ SOSY
40.0000 mg | PREFILLED_SYRINGE | INTRAMUSCULAR | Status: DC
  Administered 2024-02-25 – 2024-02-26 (×2): 40 mg via SUBCUTANEOUS
  Filled 2024-02-25 (×2): qty 0.4

## 2024-02-25 MED ORDER — FUROSEMIDE 10 MG/ML IJ SOLN
80.0000 mg | Freq: Two times a day (BID) | INTRAMUSCULAR | Status: DC
  Administered 2024-02-25 – 2024-02-26 (×2): 80 mg via INTRAVENOUS
  Filled 2024-02-25 (×2): qty 8

## 2024-02-25 MED ORDER — SPIRONOLACTONE 12.5 MG HALF TABLET
12.5000 mg | ORAL_TABLET | Freq: Every day | ORAL | Status: DC
  Administered 2024-02-25: 12.5 mg via ORAL
  Filled 2024-02-25 (×2): qty 1

## 2024-02-25 NOTE — Plan of Care (Signed)
   Problem: Education: Goal: Ability to describe self-care measures that may prevent or decrease complications (Diabetes Survival Skills Education) will improve Outcome: Progressing   Problem: Nutritional: Goal: Maintenance of adequate nutrition will improve Outcome: Progressing

## 2024-02-25 NOTE — Progress Notes (Signed)
 Pt placed on CPAP at this time with 2L O2 Bleed in. Pt is set to home settings of CPAP 16. Pt is tolerating well with no distress noted. Will continue to monitor pt.    02/25/24 2338  BiPAP/CPAP/SIPAP  $ Non-Invasive Home Ventilator  Initial  $ Face Mask Medium Yes  BiPAP/CPAP/SIPAP Pt Type Adult  BiPAP/CPAP/SIPAP Resmed  Mask Type Full face mask  Dentures removed? Not applicable  Mask Size Medium  PEEP 16 cmH20  FiO2 (%) 28 %  Patient Home Machine No  Patient Home Mask No  Patient Home Tubing No  Auto Titrate No  CPAP/SIPAP surface wiped down Yes  Device Plugged into RED Power Outlet Yes  BiPAP/CPAP /SiPAP Vitals  Pulse Rate 97  Resp 20  SpO2 96 %  Bilateral Breath Sounds Clear;Diminished  MEWS Score/Color  MEWS Score 0  MEWS Score Color Marrie Sizer

## 2024-02-25 NOTE — Care Management Obs Status (Signed)
 MEDICARE OBSERVATION STATUS NOTIFICATION   Patient Details  Name: Kathryn Mahoney MRN: [id number] Date of Birth: 08/18/1961   Medicare Observation Status Notification Given:  Yes    Bari Leys, RN 02/25/2024, 10:45 AM

## 2024-02-25 NOTE — Progress Notes (Signed)
 PROGRESS NOTE    Kathryn Mahoney  ZOX:[id number] DOB: [birth year] DOA: 02/23/2024 PCP: [facility]    Brief Narrative:  63 year old with history of chronic intermittent asthma, B12 Discenza, morbid obesity, diastolic dysfunction stage I, COPD, chronic hypoxemia on intermittent home oxygen, type 2 diabetes, hypertension, urinary incontinence presented to emergency room with about 3 weeks of exertional dyspnea, bloating sensation.  Reportedly multiple intolerance to medications and self reported side effects.  Taking torsemide  intermittently. In the emergency room hemodynamically stable.  On 2 L oxygen.  Potassium 3.3.  Troponins negative.  BNP normal.  Magnesium 1.9.  Chest x-ray with cardiomegaly and mild perihilar edema.  Echocardiogram with grade 1 diastolic dysfunction, ejection fraction 55 to 60%.  Admitted with cardiology consultation.  Subjective: Patient seen in the morning rounds.  She declined to take further Lasix  yesterday and did not receive any diuretics since admission.  We discussed that if she does not want to use injectable medications, we can manage her at home.  Tells me that she still feels bloated.  She tells me she was given a dose of morphine  for her back pain in the emergency room and she has been constipated since then. Only prefers to use torsemide , discussed with her that torsemide  is substituted by furosemide  when we have to use injection and she agreed.  Electrolytes are adequate.  Assessment & Plan:   Acute on chronic diastolic heart failure:  Essential hypertension:  Presented with orthopnea, weight gain and increased abdominal distention.  Multifactorial.  Dietary noncompliance.  Medication noncompliance.  Currently hemodynamically stable. Agreed to start on treatment Lasix  40 mg IV twice daily, Aldactone  12.5 mg daily, Entresto  24-26 twice daily restarted today.  Intake output monitoring.  Mobilize.  Cardiology following.  She will likely need local  cardiology follow-up. Blood pressure stable on amlodipine , spironolactone  and Entresto .  Palpitations/PVCs: Normal sinus rhythm now.  Will keep adequate electrolytes.  Sleep apnea: Using CPAP at night. Morbid obesity: Recently started on Ozempic. Type 2 diabetes: Well-controlled.  On glipizide . Back pain: Chronic pain issues.  She is going to follow-up with pain management clinic.  Ordered lidocaine  patch.  Avoid narcotics. Constipation: Start scheduled MiraLAX . Morbid obesity: Benefits with lifestyle modifications and dietary changes.  Counseled.    DVT prophylaxis: SCDs Start: 02/24/24 8119   Code Status: Full code Family Communication: None at the bedside Disposition Plan: Status is: Observation The patient will require care spanning > 2 midnights and should be moved to inpatient because: IV diuresis and monitoring     Consultants:  Cardiology  Procedures:  None  Antimicrobials:  None     Objective: Vitals:   02/25/24 0840 02/25/24 0842 02/25/24 0915 02/25/24 1124  BP:   (!) 142/83 117/76  Pulse:   81 (!) 42  Resp:   18 18  Temp:    98.4 F (36.9 C)  TempSrc:    Oral  SpO2: 93% 93% 96% 95%  Weight:      Height:        Intake/Output Summary (Last 24 hours) at 02/25/2024 1143 Last data filed at 02/25/2024 1127 Gross per 24 hour  Intake 460 ml  Output 700 ml  Net -240 ml   Filed Weights   02/23/24 2230  Weight: 127 kg    Examination:  General exam: Appears calm and comfortable  Respiratory system: Clear to auscultation. Respiratory effort normal.  No added sounds. Cardiovascular system: S1 & S2 heard, RRR.  Difficult to assess JVD.  No obvious distention. No  evidence of leg edema. Gastrointestinal system: Abdomen is nondistended, soft and nontender. No organomegaly or masses felt. Normal bowel sounds heard.  Obese and pendulous. Central nervous system: Alert and oriented. No focal neurological deficits. Extremities: Symmetric 5 x 5 power. Skin: No  rashes, lesions or ulcers Psychiatry: Judgement and insight appear normal. Mood & affect appropriate.     Data Reviewed: I have personally reviewed following labs and imaging studies  CBC: Recent Labs  Lab 02/23/24 2256 02/24/24 0828 02/25/24 0459  WBC 12.2* 11.3* 11.1*  HGB 11.4* 10.4* 10.7*  HCT 39.0 35.0* 37.0  MCV 86.9 85.8 88.1  PLT 525* 470* 456*   Basic Metabolic Panel: Recent Labs  Lab 02/23/24 2256 02/24/24 0828 02/25/24 0459  NA 141 140 137  K 3.3* 3.3* 4.1  CL 100 99 99  CO2 31 32 28  GLUCOSE 128* 131* 114*  BUN 18 16 19   CREATININE 0.80 0.60 0.60  CALCIUM 9.4 8.9 8.9  MG 1.9  --   --    GFR: Estimated Creatinine Clearance: 100.2 mL/min (by C-G formula based on SCr of 0.6 mg/dL). Liver Function Tests: Recent Labs  Lab 02/24/24 0828  AST 15  ALT 16  ALKPHOS 86  BILITOT 0.3  PROT 7.4  ALBUMIN 3.1*   No results for input(s): "LIPASE", "AMYLASE" in the last 168 hours. No results for input(s): "AMMONIA" in the last 168 hours. Coagulation Profile: No results for input(s): "INR", "PROTIME" in the last 168 hours. Cardiac Enzymes: No results for input(s): "CKTOTAL", "CKMB", "CKMBINDEX", "TROPONINI" in the last 168 hours. BNP (last 3 results) No results for input(s): "PROBNP" in the last 8760 hours. HbA1C: Recent Labs    02/24/24 0828  HGBA1C 6.9*   CBG: Recent Labs  Lab 02/24/24 1151 02/24/24 1659 02/24/24 2125 02/25/24 0732 02/25/24 1128  GLUCAP 149* 134* 137* 127* 109*   Lipid Profile: No results for input(s): "CHOL", "HDL", "LDLCALC", "TRIG", "CHOLHDL", "LDLDIRECT" in the last 72 hours. Thyroid Function Tests: No results for input(s): "TSH", "T4TOTAL", "FREET4", "T3FREE", "THYROIDAB" in the last 72 hours. Anemia Panel: Recent Labs    02/24/24 2112  FERRITIN 139  TIBC 305  IRON 37   Sepsis Labs: No results for input(s): "PROCALCITON", "LATICACIDVEN" in the last 168 hours.  No results found for this or any previous visit (from  the past 240 hours).       Radiology Studies: ECHOCARDIOGRAM COMPLETE Result Date: 02/24/2024    ECHOCARDIOGRAM REPORT   Patient Name:   Kathryn Mahoney Date of Exam: 02/24/2024 Medical Rec #:  [id number]       Height:       69.0 in Accession #:    [id number]      Weight:       280.0 lb Date of Birth:  [birth year]       BSA:          2.384 m Patient Age:    63 years        BP:           126/72 mmHg Patient Gender: F               HR:           89 bpm. Exam Location:  Inpatient Procedure: 2D Echo, Cardiac Doppler and Color Doppler (Both Spectral and Color            Flow Doppler were utilized during procedure). Indications:    Dysnpea, Chest pain, CHF  History:  Patient has prior history of Echocardiogram examinations, most                 recent 07/22/2021. CHF, COPD; Risk Factors:Diabetes and                 Hypertension.  Sonographer:    Juanita Shaw Referring Phys: [id number] DAVID MANUEL ORTIZ IMPRESSIONS  1. Left ventricular ejection fraction, by estimation, is 55 to 60%. The left ventricle has normal function. The left ventricle has no regional wall motion abnormalities. The left ventricular internal cavity size was mildly dilated. Left ventricular diastolic parameters are consistent with Grade I diastolic dysfunction (impaired relaxation).  2. Right ventricular systolic function is normal. The right ventricular size is normal. There is normal pulmonary artery systolic pressure.  3. Left atrial size was mild to moderately dilated.  4. The mitral valve is normal in structure. Mild mitral valve regurgitation. No evidence of mitral stenosis.  5. The aortic valve is tricuspid. Aortic valve regurgitation is not visualized. No aortic stenosis is present.  6. The inferior vena cava is dilated in size with >50% respiratory variability, suggesting right atrial pressure of 8 mmHg. FINDINGS  Left Ventricle: Left ventricular ejection fraction, by estimation, is 55 to 60%. The left ventricle has normal function. The  left ventricle has no regional wall motion abnormalities. The left ventricular internal cavity size was mildly dilated. There is  no left ventricular hypertrophy. Left ventricular diastolic parameters are consistent with Grade I diastolic dysfunction (impaired relaxation). Right Ventricle: The right ventricular size is normal. No increase in right ventricular wall thickness. Right ventricular systolic function is normal. There is normal pulmonary artery systolic pressure. The tricuspid regurgitant velocity is 1.93 m/s, and  with an assumed right atrial pressure of 8 mmHg, the estimated right ventricular systolic pressure is 22.9 mmHg. Left Atrium: Left atrial size was mild to moderately dilated. Right Atrium: Right atrial size was normal in size. Pericardium: There is no evidence of pericardial effusion. Mitral Valve: The mitral valve is normal in structure. Mild mitral valve regurgitation. No evidence of mitral valve stenosis. MV peak gradient, 5.0 mmHg. The mean mitral valve gradient is 3.0 mmHg. Tricuspid Valve: The tricuspid valve is normal in structure. Tricuspid valve regurgitation is trivial. No evidence of tricuspid stenosis. Aortic Valve: The aortic valve is tricuspid. Aortic valve regurgitation is not visualized. No aortic stenosis is present. Aortic valve mean gradient measures 4.0 mmHg. Aortic valve peak gradient measures 7.1 mmHg. Aortic valve area, by VTI measures 2.42 cm. Pulmonic Valve: The pulmonic valve was normal in structure. Pulmonic valve regurgitation is not visualized. No evidence of pulmonic stenosis. Aorta: The aortic root is normal in size and structure. Venous: The inferior vena cava is dilated in size with greater than 50% respiratory variability, suggesting right atrial pressure of 8 mmHg. IAS/Shunts: No atrial level shunt detected by color flow Doppler.  LEFT VENTRICLE PLAX 2D LVIDd:         5.80 cm      Diastology LVIDs:         4.00 cm      LV e' medial:    8.59 cm/s LV PW:          1.00 cm      LV E/e' medial:  8.7 LV IVS:        0.80 cm      LV e' lateral:   8.49 cm/s LVOT diam:     2.20 cm      LV E/e'  lateral: 8.8 LV SV:         62 LV SV Index:   26 LVOT Area:     3.80 cm  LV Volumes (MOD) LV vol d, MOD A2C: 172.0 ml LV vol d, MOD A4C: 258.0 ml LV vol s, MOD A2C: 76.1 ml LV vol s, MOD A4C: 98.9 ml LV SV MOD A2C:     95.9 ml LV SV MOD A4C:     258.0 ml LV SV MOD BP:      128.9 ml RIGHT VENTRICLE             IVC RV Basal diam:  3.70 cm     IVC diam: 2.00 cm RV S prime:     13.40 cm/s TAPSE (M-mode): 3.5 cm LEFT ATRIUM             Index        RIGHT ATRIUM           Index LA diam:        4.40 cm 1.85 cm/m   RA Area:     16.90 cm LA Vol (A2C):   55.8 ml 23.41 ml/m  RA Volume:   42.80 ml  17.96 ml/m LA Vol (A4C):   81.6 ml 34.23 ml/m LA Biplane Vol: 73.6 ml 30.88 ml/m  AORTIC VALVE                    PULMONIC VALVE AV Area (Vmax):    2.51 cm     PV Vmax:       0.88 m/s AV Area (Vmean):   2.26 cm     PV Peak grad:  3.1 mmHg AV Area (VTI):     2.42 cm AV Vmax:           133.00 cm/s AV Vmean:          97.100 cm/s AV VTI:            0.256 m AV Peak Grad:      7.1 mmHg AV Mean Grad:      4.0 mmHg LVOT Vmax:         87.70 cm/s LVOT Vmean:        57.700 cm/s LVOT VTI:          0.163 m LVOT/AV VTI ratio: 0.64  AORTA Ao Root diam: 3.20 cm Ao Asc diam:  3.00 cm MITRAL VALVE                TRICUSPID VALVE MV Area (PHT): 3.16 cm     TR Peak grad:   14.9 mmHg MV Area VTI:   2.92 cm     TR Vmax:        193.00 cm/s MV Peak grad:  5.0 mmHg MV Mean grad:  3.0 mmHg     SHUNTS MV Vmax:       1.12 m/s     Systemic VTI:  0.16 m MV Vmean:      87.9 cm/s    Systemic Diam: 2.20 cm MV Decel Time: 240 msec MR Peak grad: 99.0 mmHg MR Mean grad: 66.0 mmHg MR Vmax:      497.50 cm/s MR Vmean:     381.5 cm/s MV E velocity: 74.70 cm/s MV A velocity: 108.00 cm/s MV E/A ratio:  0.69 Maudine Sos MD Electronically signed by Maudine Sos MD Signature Date/Time: 02/24/2024/7:35:20 PM    Final    DG Chest 2  View Result Date: 02/23/2024 EXAM: 2 VIEW(S) Anise Kerns  OF THE CHEST 02/23/2024 11:04:41 PM COMPARISON: 11/10/2023 CLINICAL HISTORY: Chest pain and shortness of breath for two weeks, retaining fluid, takes lasix . FINDINGS: LUNGS AND PLEURA: Possible mild perihilar edema, equivocal. No consolidation. No pleural effusion. No pneumothorax. HEART AND MEDIASTINUM: Cardiomegaly. BONES AND SOFT TISSUES: No acute osseous abnormality. IMPRESSION: 1. Cardiomegaly with possible mild perihilar edema, equivocal. Electronically signed by: Zadie Herter MD 02/23/2024 11:12 PM EDT RP Workstation: [id number]        Scheduled Meds:  amLODipine   5 mg Oral q morning   carvedilol   3.125 mg Oral BID WC   ferrous sulfate   325 mg Oral Q breakfast   fluticasone  furoate-vilanterol  1 puff Inhalation Daily   furosemide   40 mg Intravenous Q12H   glipiZIDE   5 mg Oral Daily   lidocaine   1 patch Transdermal Q24H   polyethylene glycol  17 g Oral Daily   sacubitril -valsartan   1 tablet Oral BID   senna-docusate  1 tablet Oral BID   spironolactone   12.5 mg Oral Daily   Continuous Infusions:   LOS: 0 days      Vada Garibaldi, MD [facility]

## 2024-02-25 NOTE — Plan of Care (Signed)
  Problem: Coping: Goal: Ability to adjust to condition or change in health will improve Outcome: Progressing   Problem: Fluid Volume: Goal: Ability to maintain a balanced intake and output will improve Outcome: Progressing   Problem: Clinical Measurements: Goal: Will remain free from infection Outcome: Progressing

## 2024-02-25 NOTE — TOC Initial Note (Addendum)
 Transition of Care [facility]) - Initial/Assessment Note    Patient Details  Name: Kathryn Mahoney MRN: [id number] Date of Birth: [birth year]  [facility] CM/SW Contact:    Kathryn Leys, RN Phone Number: 02/25/2024, 11:10 AM  Clinical Narrative:  Met with patient at bedside to introduce role of TOC/NCM and review for dc planning, pt confirmed she has an established PCP and pharmacy, no current home care services, reports she has a cane she uses as needed for functional mobility. TOC consult for SNF placement, request sent to attending for PT eval. MOON completed. TOC will continue to follow.       -11:17am Teams chat from attending, patient does not need a PT eval, not requiring SNF.               Expected Discharge Plan: Home/Self Care Barriers to Discharge: Continued Medical Work up   Patient Goals and CMS Choice Patient states their goals for this hospitalization and ongoing recovery are:: return home          Expected Discharge Plan and Services       Living arrangements for the past 2 months: Single Family Home                                      Prior Living Arrangements/Services Living arrangements for the past 2 months: Single Family Home Lives with:: Relatives Patient language and need for interpreter reviewed:: Yes Do you feel safe going back to the place where you live?: Yes      Need for Family Participation in Patient Care: Yes (Comment) Care giver support system in place?: Yes (comment) Current home services: DME Kathryn Mahoney) Criminal Activity/Legal Involvement Pertinent to Current Situation/Hospitalization: No - Comment as needed  Activities of Daily Living   ADL Screening (condition at time of admission) Independently performs ADLs?: Yes (appropriate for developmental age) Is the patient deaf or have difficulty hearing?: No Does the patient have difficulty seeing, even when wearing glasses/contacts?: No Does the patient have difficulty  concentrating, remembering, or making decisions?: No  Permission Sought/Granted                  Emotional Assessment Appearance:: Appears stated age Attitude/Demeanor/Rapport: Engaged Affect (typically observed): Accepting Orientation: : Oriented to Self, Oriented to Place, Oriented to  Time, Oriented to Situation Alcohol  / Substance Use: Not Applicable Psych Involvement: No (comment)  Admission diagnosis:  Dyspnea on exertion [R06.09] Chest pain [R07.9] Edema, unspecified type [R60.9] Patient Active Problem List   Diagnosis Date Noted   Chest pain 02/24/2024   OSA (obstructive sleep apnea) 02/24/2024   Hypokalemia 02/24/2024   Hypoalbuminemia 02/24/2024   Thrombocytosis 02/24/2024   Grade I diastolic dysfunction 02/24/2024   Acute on chronic heart failure with preserved ejection fraction (HFpEF) (HCC) 07/22/2021   Diabetes mellitus type 2, uncontrolled 07/22/2021   Orthopnea 07/22/2021   Obesity, Class III, BMI 40-49.9 (morbid obesity) (HCC) 07/22/2021   Chronic diastolic heart failure (HCC) 01/06/2021   Nonrheumatic mitral valve regurgitation 01/06/2021   Type 2 diabetes mellitus with obesity (HCC) 01/06/2021   CHF exacerbation (HCC) 09/03/2020   Vitamin B12 deficiency 01/02/2019   Vitamin D  deficiency 01/02/2019   Iron deficiency anemia 11/17/2018   Pulmonary hypertension, unspecified (HCC) 09/26/2016   Type II or unspecified type diabetes mellitus without mention of complication, not stated as uncontrolled 11/07/2013   Hyperglycemia 11/06/2013   COPD exacerbation (  HCC) 11/02/2013   PNA (pneumonia) 11/02/2013   Hypoxia 11/02/2013   Acute-on-chronic respiratory failure (HCC) 11/02/2013   COPD (chronic obstructive pulmonary disease) (HCC)    CHF (congestive heart failure) (HCC)    Urinary incontinence    Hypertension    PCP:  [facility] Pharmacy:   [id number] - [place], [place] - [address] AT [facility] [address] &  [address] [facility] [id number] Phone: [phone] Fax: [phone]     Social Drivers of Health (SDOH) Social History: SDOH Screenings   Food Insecurity: Patient Declined (02/24/2024)  Housing: Patient Declined (02/24/2024)  Transportation Needs: Patient Declined (02/24/2024)  Utilities: Patient Declined (02/24/2024)  Financial Resource Strain: Low Risk  (03/07/2023)   Received from [facility]  Physical Activity: Patient Declined (03/07/2023)   Received from [facility]  Social Connections: Patient Declined (02/24/2024)  Stress: No Stress Concern Present (03/07/2023)   Received from [facility]  Tobacco Use: Medium Risk (02/23/2024)  Health Literacy: Low Risk  (03/07/2023)   Received from [facility]   SDOH Interventions:     Readmission Risk Interventions     No data to display

## 2024-02-25 NOTE — Progress Notes (Addendum)
 Patient Name: Kathryn Mahoney Date of Encounter: 02/25/2024 [facility] Cardiologist: Sheryle Donning, MD    Interval Summary  .    Patient reports feeling better today. Breathing is improving. No lower extremity swelling. Continues to feel a bit bloated around her abdomen. She reports that she did not urinate very much yesterday but has been urinating a lot more this AM   Vital Signs .    Vitals:   02/25/24 0550 02/25/24 0840 02/25/24 0842 02/25/24 0915  BP: 122/78   (!) 142/83  Pulse: 91   81  Resp: 18   18  Temp: 98.3 F (36.8 C)     TempSrc: Oral     SpO2: 98% 93% 93% 96%  Weight:      Height:        Intake/Output Summary (Last 24 hours) at 02/25/2024 1039 Last data filed at 02/25/2024 0916 Gross per 24 hour  Intake 460 ml  Output 300 ml  Net 160 ml      02/23/2024   10:30 PM 11/10/2023    3:37 PM 10/29/2022    1:32 PM  Last 3 Weights  Weight (lbs) 280 lb 280 lb 288 lb 12.8 oz  Weight (kg) 127.007 kg 127.007 kg 131 kg      Telemetry/ECG    NSR, occasional PVCs - Personally Reviewed  Physical Exam .   GEN: No acute distress.  Sitting comfortably in the bed Neck: No JVD Cardiac:  RRR, no murmurs, rubs, or gallops.  Respiratory: Clear to auscultation bilaterally. Normal WOB on room air  GI: Soft, nontender, mildly distended  MS: No edema in BLE   Assessment & Plan .     Acute on chronic diastolic heart failure  Medication non adherence  - Patient has history of chronic diastolic heart failure, previously followed by cardiology through [facility].  Reports she has not seen a cardiologist in a very long time - Patient has self-discontinued multiple heart medications in the past due to reported intolerances/side effects. Reports having worsening back pain with spiro, entresto , carvedilol . Note, she does have chronic back pain for which she sees pain specialists. Discussed it is very unlikely that her medications are contributing  - Resumed spironolactone    12.5 mg daily, entresto  24-26 mg BID. Tolerating well  - Now on IV lasix  40 mg BID. Renal function stable  - Body habitus makes it difficult to assess volume status. She feels better today, but continues to have abdominal bloating. Creatinine stable  - Continue IV lasix  for now, monitor strict I/Os, daily weights, BMPs to follow renal function. Anticipate transition to oral diuretics tomorrow  - Echocardiogram yesterday showed EF 55-60%, no regional wall motion abnormalities, mild MR    HTN  - Continue amlodipine  5 mg daily  - Continue spironolactone  12.5 mg daily  - Continue entresto  24-26 mg BID    Mitral Regurgitation  - Mild-moderate on echo in 06/2021 - Echo  this admission showed mild MR    Palpitations  PVCs - Patient reports occasional fluttering in her chest. Per tele, she is maintaining NSR with occasional PVCs  - Continue to monitor on telemetry  - Denies palpitations since coming to the hospital  - Start carvedilol  3.125 mg BID - K 4.2, mag 1.9    Otherwise per primary  - OSA- reports compliance with CPAP  - Morbid Obesity - BMI 41. On ozempic - Type 2 DM  - Back pain  - Hypokalemia-  supplementation ordered   For questions  or updates, please contact [facility] Please consult [email or web address] for contact info under        Signed, Debria Fang, PA-C

## 2024-02-26 DIAGNOSIS — I10 Essential (primary) hypertension: Secondary | ICD-10-CM | POA: Diagnosis not present

## 2024-02-26 DIAGNOSIS — I5033 Acute on chronic diastolic (congestive) heart failure: Secondary | ICD-10-CM | POA: Diagnosis not present

## 2024-02-26 DIAGNOSIS — I340 Nonrheumatic mitral (valve) insufficiency: Secondary | ICD-10-CM | POA: Diagnosis not present

## 2024-02-26 LAB — BASIC METABOLIC PANEL WITH GFR
Anion gap: 11 (ref 5–15)
BUN: 19 mg/dL (ref 8–23)
CO2: 28 mmol/L (ref 22–32)
Calcium: 9.1 mg/dL (ref 8.9–10.3)
Chloride: 99 mmol/L (ref 98–111)
Creatinine, Ser: 1.03 mg/dL — ABNORMAL HIGH (ref 0.44–1.00)
GFR, Estimated: >60 mL/min (ref >60)
Glucose, Bld: 110 mg/dL — ABNORMAL HIGH (ref 70–99)
Potassium: 3.5 mmol/L (ref 3.5–5.1)
Sodium: 138 mmol/L (ref 135–145)

## 2024-02-26 LAB — GLUCOSE, CAPILLARY
Glucose-Capillary: 129 mg/dL — ABNORMAL HIGH (ref 70–99)
Glucose-Capillary: 117 mg/dL — ABNORMAL HIGH (ref 70–99)
Glucose-Capillary: 100 mg/dL — ABNORMAL HIGH (ref 70–99)
Glucose-Capillary: 124 mg/dL — ABNORMAL HIGH (ref 70–99)

## 2024-02-26 MED ORDER — METOPROLOL SUCCINATE ER 25 MG PO TB24
25.0000 mg | ORAL_TABLET | Freq: Every day | ORAL | Status: DC
Start: 2024-02-26 — End: 2024-02-27
  Administered 2024-02-26 – 2024-02-27 (×2): 25 mg via ORAL
  Filled 2024-02-26 (×2): qty 1

## 2024-02-26 MED ORDER — SPIRONOLACTONE 25 MG PO TABS
25.0000 mg | ORAL_TABLET | Freq: Every day | ORAL | Status: DC
  Administered 2024-02-26: 12.5 mg via ORAL
  Administered 2024-02-27: 25 mg via ORAL
  Filled 2024-02-26 (×2): qty 1

## 2024-02-26 MED ORDER — TORSEMIDE 20 MG PO TABS
40.0000 mg | ORAL_TABLET | Freq: Two times a day (BID) | ORAL | Status: DC
  Administered 2024-02-26 – 2024-02-27 (×2): 40 mg via ORAL
  Filled 2024-02-26 (×2): qty 2

## 2024-02-26 NOTE — Progress Notes (Signed)
 During assessment patient states she had carvedilol  earlier today and that made a "knot" in her stomach and she doesn't want to take it anymore. I told her that she doesn't have it order for tonight but I will let day shift RN know.

## 2024-02-26 NOTE — Progress Notes (Signed)
 Mobility Specialist - Progress Note   02/26/24 0953  Mobility  Activity Ambulated independently in hallway  Level of Assistance Independent  Assistive Device None  Distance Ambulated (ft) 500 ft  Activity Response Tolerated well  Mobility Referral Yes  Mobility visit 1 Mobility  Mobility Specialist Start Time (ACUTE ONLY) 0946  Mobility Specialist Stop Time (ACUTE ONLY) 0953  Mobility Specialist Time Calculation (min) (ACUTE ONLY) 7 min   Pt received in bed and agreeable to mobility. No complaints during session. Pt to EOB after session with all needs met.    [facility]

## 2024-02-26 NOTE — Progress Notes (Signed)
 PROGRESS NOTE    Kathryn Mahoney  DGL:[id number] DOB: 11/29/1960 DOA: 02/23/2024 PCP: [facility]    Brief Narrative:  63 year old with history of chronic intermittent asthma, B12 Discenza, morbid obesity, diastolic dysfunction stage I, COPD, chronic hypoxemia on intermittent home oxygen, type 2 diabetes, hypertension, urinary incontinence presented to emergency room with about 3 weeks of exertional dyspnea, bloating sensation.  Reportedly multiple intolerance to medications and self reported side effects.  Taking torsemide  intermittently. In the emergency room hemodynamically stable.  On 2 L oxygen.  Potassium 3.3.  Troponins negative.  BNP normal.  Magnesium 1.9.  Chest x-ray with cardiomegaly and mild perihilar edema.  Echocardiogram with grade 1 diastolic dysfunction, ejection fraction 55 to 60%.  Admitted with cardiology consultation.  Subjective:  Patient seen and examined.  She tells me she peed a lot.  She tells me that carvedilol  gave her a knot on her belly yesterday and she declined it.  She was wondering why she had her getting amlodipine . On room air. Urine output measured 1 L.  There are some unmeasured urine output as per patient. She was eager to go home, I discussed this with cardiology who recommended monitoring on newly started medications.  Assessment & Plan:   Acute on chronic diastolic heart failure:  Essential hypertension:  Presented with orthopnea, weight gain and increased abdominal distention.  Multifactorial.  Dietary noncompliance.  Medication noncompliance.  Currently hemodynamically stable. Treated with IV Lasix  80 mg IV twice daily, today converted to torsemide  40 mg twice daily. Aldactone , increased dose to 25 mg daily. Tolerating Entresto  24-26. Did not tolerate Coreg .  Started on Toprol -XL. Intake output monitoring.  Mobilize.  Cardiology following.  She will likely need local cardiology follow-up. Blood pressure stable.  Amlodipine   discontinued to make room for Entresto  and torsemide  as well as metoprolol .  Palpitations/PVCs: Normal sinus rhythm now.  Will keep adequate electrolytes. Started on Toprol -XL 25 mL daily. Cardiology to order Zio patch for ambulatory monitoring.   Sleep apnea: Using CPAP at night.  Morbid obesity: Recently started on Ozempic.  Type 2 diabetes: Well-controlled.  On glipizide .  Back pain: Chronic pain issues.  She is going to follow-up with pain management clinic.  Ordered lidocaine  patch.  Avoid narcotics.  Constipation: Start scheduled MiraLAX .  Morbid obesity: Benefits with lifestyle modifications and dietary changes.  Counseled.    DVT prophylaxis: enoxaparin  (LOVENOX ) injection 40 mg Start: 02/25/24 2200 SCDs Start: 02/24/24 5188   Code Status: Full code Family Communication: None at the bedside Disposition Plan: Status is: Observation The patient will require care spanning > 2 midnights and should be moved to inpatient because: IV diuresis and monitoring     Consultants:  Cardiology  Procedures:  None  Antimicrobials:  None     Objective: Vitals:   02/25/24 2338 02/26/24 0624 02/26/24 0852 02/26/24 1100  BP:  113/72  108/73  Pulse: 97 (!) 103    Resp: 20 18    Temp:  98.3 F (36.8 C)    TempSrc:  Oral    SpO2: 96% 92% 96%   Weight:      Height:        Intake/Output Summary (Last 24 hours) at 02/26/2024 1318 Last data filed at 02/26/2024 1000 Gross per 24 hour  Intake 840 ml  Output 600 ml  Net 240 ml   Filed Weights   02/23/24 2230  Weight: 127 kg    Examination:  General exam: Appears calm and comfortable.  Able to walk around in  the hallway. Respiratory system: Clear to auscultation. Respiratory effort normal.  No added sounds. Unable to appreciate any added sound. Cardiovascular system: S1 & S2 heard, RRR.  Difficult to assess JVD.  No obvious distention. No evidence of leg edema. Gastrointestinal system: Abdomen is nondistended, soft  and nontender. No organomegaly or masses felt. Normal bowel sounds heard.  Obese and pendulous. Central nervous system: Alert and oriented. No focal neurological deficits. Extremities: Symmetric 5 x 5 power. Skin: No rashes, lesions or ulcers Psychiatry: Judgement and insight appear normal. Mood & affect appropriate.     Data Reviewed: I have personally reviewed following labs and imaging studies  CBC: Recent Labs  Lab 02/23/24 2256 02/24/24 0828 02/25/24 0459  WBC 12.2* 11.3* 11.1*  HGB 11.4* 10.4* 10.7*  HCT 39.0 35.0* 37.0  MCV 86.9 85.8 88.1  PLT 525* 470* 456*   Basic Metabolic Panel: Recent Labs  Lab 02/23/24 2256 02/24/24 0828 02/25/24 0459 02/26/24 0446  NA 141 140 137 138  K 3.3* 3.3* 4.1 3.5  CL 100 99 99 99  CO2 31 32 28 28  GLUCOSE 128* 131* 114* 110*  BUN 18 16 19 19   CREATININE 0.80 0.60 0.60 1.03*  CALCIUM 9.4 8.9 8.9 9.1  MG 1.9  --   --   --    GFR: Estimated Creatinine Clearance: 77.8 mL/min (A) (by C-G formula based on SCr of 1.03 mg/dL (H)). Liver Function Tests: Recent Labs  Lab 02/24/24 0828  AST 15  ALT 16  ALKPHOS 86  BILITOT 0.3  PROT 7.4  ALBUMIN 3.1*   No results for input(s): "LIPASE", "AMYLASE" in the last 168 hours. No results for input(s): "AMMONIA" in the last 168 hours. Coagulation Profile: No results for input(s): "INR", "PROTIME" in the last 168 hours. Cardiac Enzymes: No results for input(s): "CKTOTAL", "CKMB", "CKMBINDEX", "TROPONINI" in the last 168 hours. BNP (last 3 results) No results for input(s): "PROBNP" in the last 8760 hours. HbA1C: Recent Labs    02/24/24 0828  HGBA1C 6.9*   CBG: Recent Labs  Lab 02/25/24 1128 02/25/24 1639 02/25/24 2150 02/26/24 0731 02/26/24 1133  GLUCAP 109* 112* 94 129* 117*   Lipid Profile: No results for input(s): "CHOL", "HDL", "LDLCALC", "TRIG", "CHOLHDL", "LDLDIRECT" in the last 72 hours. Thyroid Function Tests: No results for input(s): "TSH", "T4TOTAL", "FREET4",  "T3FREE", "THYROIDAB" in the last 72 hours. Anemia Panel: Recent Labs    02/24/24 2112  FERRITIN 139  TIBC 305  IRON 37   Sepsis Labs: No results for input(s): "PROCALCITON", "LATICACIDVEN" in the last 168 hours.  No results found for this or any previous visit (from the past 240 hours).       Radiology Studies: ECHOCARDIOGRAM COMPLETE Result Date: 02/24/2024    ECHOCARDIOGRAM REPORT   Patient Name:   MIRELA RAVENEL Date of Exam: 02/24/2024 Medical Rec #:  [id number]       Height:       69.0 in Accession #:    [id number]      Weight:       280.0 lb Date of Birth:  01-21-1961       BSA:          2.384 m Patient Age:    63 years        BP:           126/72 mmHg Patient Gender: F               HR:  89 bpm. Exam Location:  Inpatient Procedure: 2D Echo, Cardiac Doppler and Color Doppler (Both Spectral and Color            Flow Doppler were utilized during procedure). Indications:    Dysnpea, Chest pain, CHF  History:        Patient has prior history of Echocardiogram examinations, most                 recent 07/22/2021. CHF, COPD; Risk Factors:Diabetes and                 Hypertension.  Sonographer:    Juanita Shaw Referring Phys: [id number] DAVID MANUEL ORTIZ IMPRESSIONS  1. Left ventricular ejection fraction, by estimation, is 55 to 60%. The left ventricle has normal function. The left ventricle has no regional wall motion abnormalities. The left ventricular internal cavity size was mildly dilated. Left ventricular diastolic parameters are consistent with Grade I diastolic dysfunction (impaired relaxation).  2. Right ventricular systolic function is normal. The right ventricular size is normal. There is normal pulmonary artery systolic pressure.  3. Left atrial size was mild to moderately dilated.  4. The mitral valve is normal in structure. Mild mitral valve regurgitation. No evidence of mitral stenosis.  5. The aortic valve is tricuspid. Aortic valve regurgitation is not visualized. No aortic  stenosis is present.  6. The inferior vena cava is dilated in size with >50% respiratory variability, suggesting right atrial pressure of 8 mmHg. FINDINGS  Left Ventricle: Left ventricular ejection fraction, by estimation, is 55 to 60%. The left ventricle has normal function. The left ventricle has no regional wall motion abnormalities. The left ventricular internal cavity size was mildly dilated. There is  no left ventricular hypertrophy. Left ventricular diastolic parameters are consistent with Grade I diastolic dysfunction (impaired relaxation). Right Ventricle: The right ventricular size is normal. No increase in right ventricular wall thickness. Right ventricular systolic function is normal. There is normal pulmonary artery systolic pressure. The tricuspid regurgitant velocity is 1.93 m/s, and  with an assumed right atrial pressure of 8 mmHg, the estimated right ventricular systolic pressure is 22.9 mmHg. Left Atrium: Left atrial size was mild to moderately dilated. Right Atrium: Right atrial size was normal in size. Pericardium: There is no evidence of pericardial effusion. Mitral Valve: The mitral valve is normal in structure. Mild mitral valve regurgitation. No evidence of mitral valve stenosis. MV peak gradient, 5.0 mmHg. The mean mitral valve gradient is 3.0 mmHg. Tricuspid Valve: The tricuspid valve is normal in structure. Tricuspid valve regurgitation is trivial. No evidence of tricuspid stenosis. Aortic Valve: The aortic valve is tricuspid. Aortic valve regurgitation is not visualized. No aortic stenosis is present. Aortic valve mean gradient measures 4.0 mmHg. Aortic valve peak gradient measures 7.1 mmHg. Aortic valve area, by VTI measures 2.42 cm. Pulmonic Valve: The pulmonic valve was normal in structure. Pulmonic valve regurgitation is not visualized. No evidence of pulmonic stenosis. Aorta: The aortic root is normal in size and structure. Venous: The inferior vena cava is dilated in size with  greater than 50% respiratory variability, suggesting right atrial pressure of 8 mmHg. IAS/Shunts: No atrial level shunt detected by color flow Doppler.  LEFT VENTRICLE PLAX 2D LVIDd:         5.80 cm      Diastology LVIDs:         4.00 cm      LV e' medial:    8.59 cm/s LV PW:  1.00 cm      LV E/e' medial:  8.7 LV IVS:        0.80 cm      LV e' lateral:   8.49 cm/s LVOT diam:     2.20 cm      LV E/e' lateral: 8.8 LV SV:         62 LV SV Index:   26 LVOT Area:     3.80 cm  LV Volumes (MOD) LV vol d, MOD A2C: 172.0 ml LV vol d, MOD A4C: 258.0 ml LV vol s, MOD A2C: 76.1 ml LV vol s, MOD A4C: 98.9 ml LV SV MOD A2C:     95.9 ml LV SV MOD A4C:     258.0 ml LV SV MOD BP:      128.9 ml RIGHT VENTRICLE             IVC RV Basal diam:  3.70 cm     IVC diam: 2.00 cm RV S prime:     13.40 cm/s TAPSE (M-mode): 3.5 cm LEFT ATRIUM             Index        RIGHT ATRIUM           Index LA diam:        4.40 cm 1.85 cm/m   RA Area:     16.90 cm LA Vol (A2C):   55.8 ml 23.41 ml/m  RA Volume:   42.80 ml  17.96 ml/m LA Vol (A4C):   81.6 ml 34.23 ml/m LA Biplane Vol: 73.6 ml 30.88 ml/m  AORTIC VALVE                    PULMONIC VALVE AV Area (Vmax):    2.51 cm     PV Vmax:       0.88 m/s AV Area (Vmean):   2.26 cm     PV Peak grad:  3.1 mmHg AV Area (VTI):     2.42 cm AV Vmax:           133.00 cm/s AV Vmean:          97.100 cm/s AV VTI:            0.256 m AV Peak Grad:      7.1 mmHg AV Mean Grad:      4.0 mmHg LVOT Vmax:         87.70 cm/s LVOT Vmean:        57.700 cm/s LVOT VTI:          0.163 m LVOT/AV VTI ratio: 0.64  AORTA Ao Root diam: 3.20 cm Ao Asc diam:  3.00 cm MITRAL VALVE                TRICUSPID VALVE MV Area (PHT): 3.16 cm     TR Peak grad:   14.9 mmHg MV Area VTI:   2.92 cm     TR Vmax:        193.00 cm/s MV Peak grad:  5.0 mmHg MV Mean grad:  3.0 mmHg     SHUNTS MV Vmax:       1.12 m/s     Systemic VTI:  0.16 m MV Vmean:      87.9 cm/s    Systemic Diam: 2.20 cm MV Decel Time: 240 msec MR Peak grad: 99.0 mmHg  MR Mean grad: 66.0 mmHg MR Vmax:      497.50 cm/s MR Vmean:  381.5 cm/s MV E velocity: 74.70 cm/s MV A velocity: 108.00 cm/s MV E/A ratio:  0.69 Maudine Sos MD Electronically signed by Maudine Sos MD Signature Date/Time: 02/24/2024/7:35:20 PM    Final         Scheduled Meds:  enoxaparin  (LOVENOX ) injection  40 mg Subcutaneous Q24H   ferrous sulfate   325 mg Oral Q breakfast   fluticasone  furoate-vilanterol  1 puff Inhalation Daily   glipiZIDE   5 mg Oral Daily   lidocaine   1 patch Transdermal Q24H   metoprolol  succinate  25 mg Oral Daily   polyethylene glycol  17 g Oral Daily   sacubitril -valsartan   1 tablet Oral BID   senna-docusate  1 tablet Oral BID   spironolactone   25 mg Oral Daily   torsemide   40 mg Oral BID   Continuous Infusions:   LOS: 0 days      Vada Garibaldi, MD [facility]

## 2024-02-26 NOTE — Plan of Care (Signed)

## 2024-02-26 NOTE — Progress Notes (Signed)
 Heart Failure Navigator Progress Note  Assessed for Heart & Vascular TOC clinic readiness.  Patient does not meet criteria due to EF 55-60%, per MD follows with [facility] Cardiology. No HF TOC. .   Navigator will sign off at this time.   Randie Bustle, BSN, Scientist, clinical (histocompatibility and immunogenetics) Only

## 2024-02-26 NOTE — Plan of Care (Signed)
  Problem: Nutritional: Goal: Maintenance of adequate nutrition will improve Outcome: Progressing   Problem: Health Behavior/Discharge Planning: Goal: Ability to manage health-related needs will improve Outcome: Progressing

## 2024-02-26 NOTE — Progress Notes (Signed)
 TOC consult for [facility] Heart Failure Screen; orders for Heart Failure Navigation Team previously placed.

## 2024-02-26 NOTE — Progress Notes (Addendum)
 Patient Name: Kathryn Mahoney Date of Encounter: 02/26/2024 [facility] Cardiologist: Sheryle Donning, MD    Interval Summary  .    Breathing continues to improve.  Refusing to take Carvedilol .  Sinus tach on tele  Vital Signs .    Vitals:   02/25/24 2045 02/25/24 2054 02/25/24 2338 02/26/24 0624  BP:  131/74  113/72  Pulse:  99 97 (!) 103  Resp:  18 20 18   Temp:  98.5 F (36.9 C)  98.3 F (36.8 C)  TempSrc:  Oral  Oral  SpO2: 96% 96% 96% 92%  Weight:      Height:        Intake/Output Summary (Last 24 hours) at 02/26/2024 1610 Last data filed at 02/26/2024 0600 Gross per 24 hour  Intake 840 ml  Output 1000 ml  Net -160 ml      02/23/2024   10:30 PM 11/10/2023    3:37 PM 10/29/2022    1:32 PM  Last 3 Weights  Weight (lbs) 280 lb 280 lb 288 lb 12.8 oz  Weight (kg) 127.007 kg 127.007 kg 131 kg      Telemetry/ECG    NSR - Personally Reviewed  Physical Exam .   GEN: Well nourished, well developed in no acute distress HEENT: Normal NECK: No JVD; No carotid bruits LYMPHATICS: No lymphadenopathy CARDIAC:RRR, no murmurs, rubs, gallops RESPIRATORY:  Clear to auscultation without rales, wheezing or rhonchi  ABDOMEN: Soft, non-tender, non-distended MUSCULOSKELETAL:  No edema; No deformity  SKIN: Warm and dry NEUROLOGIC:  Alert and oriented x 3 PSYCHIATRIC:  Normal affect  Assessment & Plan .    Acute on chronic diastolic CHF Morbid obesity Medication noncompliance Patient has a history of chronic diastolic heart failure followed by [facility] cardiology at [place] but has difficulty getting there for her appointments due to the distance Initially she was started on Entresto , carvedilol  and torsemide  but stopped taking these because she said they made her back hurt. She refused spironolactone  because she said it caused back pain in the past as well I had a long discussion with her about cardiac meds and the need to take these in order to avoid hospitalizations for  volume overload I told her that her back pain is not coming from cardiac medicines and is likely musculoskeletal in etiology Volume status is extremely difficult to assess due to morbid obesity.  Her BNP is normal but likely a false normal given her underlying obesity.  She has no lower extremity edema but does appear to have some increased firmness of her abdomen Currently on Lasix  40 mg IV twice daily UOP 1L yesterday and net - 120cc despite increasing Lasix  to 80mg  IV BID>>suspect this is not accurate SCr 1.03 (bumped from 0.60 yesterday) and K+ 3.5 Weights have not been done>>will encourage nursing staff to check daily Appears euvolemic on exam although difficult in setting of morbid obesity Follow strict I's and O's, daily weights and renal function while diuresing 2D echo EF 55 to 60% with G1 DD, normal RV, mild to moderate LAE, mild MR, RAP 8 mmHg GDMT: Refusing to take carvedilol  so will try Toprol  XL 25mg  daily Continue Entresto  24-26mg  BID Increase spiro to 25mg  daily Not started on SGLT2i  due to history of GYN yeast infections/morbid obesity Stop Lasix  and transition to Torsemide  40mg  BID (was on 60mg  daily PTA) Stopped amlodipine  to allow more BP for up titration of GDMT   Hypertension BP stable at 113/80mmHg Continue Entresto  24-26mg  BID Refuses Carvedilol   so will try Toprol  XL 25mg  daily Increasing spiro to 25mg  daily  PTA amlodipine  stopped to allow more BP for up titration of GDMT and to avoid LE edema   Mitral regurgitation Read out as moderate to severe on last echo but over read felt to be moderate Repeat echo this admit with mild to moderate MR Will need to be followed outpatient yearly   Palpitations EKG is normal and telemetry has been normal as well Will get a 2-week Zio patch on discharge to rule out atrial fibrillation   Obstructive sleep apnea Reports compliance with CPAP but would need follow-up with a sleep MD  I spent 35 minutes caring for this  patient today face to face, ordering and reviewing labs, reviewing records from 2D echo , seeing the patient, documenting in the record  For questions or updates, please contact [facility] Please consult [email or web address] for contact info under    Signed, Gaylyn Keas, MD  [facility] 02/26/2024

## 2024-02-26 NOTE — Progress Notes (Signed)
 Pt placed on CPAP at this time for the night. Pt is tolerating well with no distress noted. Pt is currently on Room Air. Will continue to monitor pt.    02/26/24 2337  BiPAP/CPAP/SIPAP  BiPAP/CPAP/SIPAP Pt Type Adult  BiPAP/CPAP/SIPAP Resmed  Mask Type Full face mask  Dentures removed? Not applicable  Mask Size Medium  PEEP 16 cmH20  FiO2 (%) 21 %  Patient Home Machine No  Patient Home Mask No  Patient Home Tubing No  Auto Titrate No  CPAP/SIPAP surface wiped down Yes  Device Plugged into RED Power Outlet Yes  BiPAP/CPAP /SiPAP Vitals  Pulse Rate 95  Resp 18  SpO2 97 %  Bilateral Breath Sounds Clear;Diminished  MEWS Score/Color  MEWS Score 0  MEWS Score Color Marrie Sizer

## 2024-02-26 NOTE — Progress Notes (Signed)
 Pt chart corrected to show pt on CPAP at this time and not BIPAP    02/26/24 2337  Therapy Vitals  Pulse Rate 95  Resp 18  Patient Position (if appropriate) Lying  MEWS Score/Color  MEWS Score 0  MEWS Score Color Green  Oxygen Therapy/Pulse Ox  O2 Device (S)  CPAP  O2 Therapy Room air  FiO2 (%) 21 %  SpO2 97 %

## 2024-02-27 ENCOUNTER — Other Ambulatory Visit: Payer: Self-pay | Admitting: Student

## 2024-02-27 DIAGNOSIS — R002 Palpitations: Secondary | ICD-10-CM

## 2024-02-27 DIAGNOSIS — I5033 Acute on chronic diastolic (congestive) heart failure: Secondary | ICD-10-CM | POA: Diagnosis not present

## 2024-02-27 LAB — BASIC METABOLIC PANEL WITH GFR
Anion gap: 9 (ref 5–15)
BUN: 21 mg/dL (ref 8–23)
CO2: 30 mmol/L (ref 22–32)
Calcium: 9.5 mg/dL (ref 8.9–10.3)
Chloride: 98 mmol/L (ref 98–111)
Creatinine, Ser: 0.99 mg/dL (ref 0.44–1.00)
GFR, Estimated: >60 mL/min (ref >60)
Glucose, Bld: 132 mg/dL — ABNORMAL HIGH (ref 70–99)
Potassium: 3.6 mmol/L (ref 3.5–5.1)
Sodium: 137 mmol/L (ref 135–145)

## 2024-02-27 LAB — GLUCOSE, CAPILLARY: Glucose-Capillary: 118 mg/dL — ABNORMAL HIGH (ref 70–99)

## 2024-02-27 MED ORDER — TORSEMIDE 20 MG PO TABS
40.0000 mg | ORAL_TABLET | Freq: Two times a day (BID) | ORAL | Status: DC
Start: 2024-02-27 — End: 2024-06-20

## 2024-02-27 MED ORDER — POLYETHYLENE GLYCOL 3350 17 G PO PACK: 17.0000 g | PACK | Freq: Every day | ORAL | 0 refills | Status: AC

## 2024-02-27 MED ORDER — SENNOSIDES-DOCUSATE SODIUM 8.6-50 MG PO TABS: 1.0000 | ORAL_TABLET | Freq: Two times a day (BID) | ORAL | 0 refills | Status: AC

## 2024-02-27 MED ORDER — SACUBITRIL-VALSARTAN 24-26 MG PO TABS: 1.0000 | ORAL_TABLET | Freq: Two times a day (BID) | ORAL | 0 refills | Status: DC

## 2024-02-27 MED ORDER — POTASSIUM CHLORIDE CRYS ER 20 MEQ PO TBCR
40.0000 meq | EXTENDED_RELEASE_TABLET | Freq: Every day | ORAL | Status: DC
  Administered 2024-02-27: 40 meq via ORAL
  Filled 2024-02-27: qty 2

## 2024-02-27 MED ORDER — FLUCONAZOLE 100 MG PO TABS: 100.0000 mg | ORAL_TABLET | ORAL | 0 refills | Status: AC

## 2024-02-27 MED ORDER — LIDOCAINE 5 % EX PTCH: 1.0000 | MEDICATED_PATCH | CUTANEOUS | 0 refills | Status: AC

## 2024-02-27 MED ORDER — SPIRONOLACTONE 25 MG PO TABS: 25.0000 mg | ORAL_TABLET | Freq: Every day | ORAL | 0 refills | Status: DC

## 2024-02-27 MED ORDER — POTASSIUM CHLORIDE CRYS ER 20 MEQ PO TBCR: 40.0000 meq | EXTENDED_RELEASE_TABLET | Freq: Once | ORAL | Status: DC

## 2024-02-27 MED ORDER — METOPROLOL SUCCINATE ER 25 MG PO TB24: 25.0000 mg | ORAL_TABLET | Freq: Every day | ORAL | 0 refills | Status: DC

## 2024-02-27 NOTE — Progress Notes (Signed)
 Patient ID: Kathryn Mahoney, female   DOB: 1961-05-21, 63 y.o.   MRN: [id number]   An After Visit Summary was printed and given to the patient.   Patient education given on medication changes, lifestyle changes, diet changes, and follow up appointments and the patient expresses understanding and acceptance of instructions.  Approximately 30 minutes was spent on heart failure education.   Anica Alcaraz D Malekai Markwood 02/27/2024 12:10 PM

## 2024-02-27 NOTE — TOC Transition Note (Signed)
 Transition of Care [facility]) - Discharge Note   Patient Details  Name: Kathryn Mahoney MRN: [id number] Date of Birth: [birth year]  [facility] CM/SW Contact:  Levie Ream, RN Phone Number: 02/27/2024, 9:18 AM   Clinical Narrative:    D/C orders received; no TOC needs.   Final next level of care: Home/Self Care Barriers to Discharge: No Barriers Identified   Patient Goals and CMS Choice Patient states their goals for this hospitalization and ongoing recovery are:: return home          Discharge Placement                       Discharge Plan and Services Additional resources added to the After Visit Summary for                                       Social Drivers of Health (SDOH) Interventions SDOH Screenings   Food Insecurity: Patient Declined (02/24/2024)  Housing: Patient Declined (02/24/2024)  Transportation Needs: Patient Declined (02/24/2024)  Utilities: Patient Declined (02/24/2024)  Financial Resource Strain: Low Risk  (03/07/2023)   Received from [facility]  Physical Activity: Patient Declined (03/07/2023)   Received from [facility]  Social Connections: Patient Declined (02/24/2024)  Stress: No Stress Concern Present (03/07/2023)   Received from [facility]  Tobacco Use: Medium Risk (02/23/2024)  Health Literacy: Low Risk  (03/07/2023)   Received from [facility]     Readmission Risk Interventions     No data to display

## 2024-02-27 NOTE — Discharge Summary (Signed)
 Physician Discharge Summary  Kathryn Mahoney EXB:[id number] DOB: 07-Apr-1961 DOA: 02/23/2024  PCP: [facility]  Admit date: 02/23/2024 Discharge date: 02/27/2024  Admitted From: Home Disposition: Home  Recommendations for Outpatient Follow-up:  Follow up with PCP in 1-2 weeks Please obtain BMP/CBC in one week Cardiology to schedule follow-up  Home Health: N/A Equipment/Devices: N/A  Discharge Condition: Stable CODE STATUS: Full code Diet recommendation: Low-salt and low-carb diet  Discharge summary: 63 year old with history of chronic intermittent asthma, B12 deficiency, morbid obesity, diastolic dysfunction stage I, COPD, chronic hypoxemia on intermittent home oxygen, type 2 diabetes, hypertension, urinary incontinence presented to emergency room with about 3 weeks of exertional dyspnea, bloating sensation.  Reportedly multiple intolerance to medications and self reported side effects.  Taking torsemide  intermittently. In the emergency room hemodynamically stable.  On 2 L oxygen.  Potassium 3.3.  Troponins negative.  BNP normal.  Magnesium 1.9.  Chest x-ray with cardiomegaly and mild perihilar edema.  Echocardiogram with grade 1 diastolic dysfunction, ejection fraction 55 to 60%.  Admitted with cardiology consultation and treated with IV diuretics.  Clinically improved today.  Different medication regimen were tried and currently tolerating.   Acute on chronic diastolic heart failure:  Essential hypertension:  Presented with orthopnea, weight gain and increased abdominal distention.  Multifactorial.  Dietary noncompliance.  Medication noncompliance.   Treated with IV Lasix  80 mg IV twice daily, converted to torsemide  40 mg twice daily. Aldactone , increased dose to 25 mg daily. Tolerating Entresto  24-26. Did not tolerate Coreg .  Started on Toprol -XL and tolerating. Patient is medically stabilized today. She was showing frequent PVCs on the monitor, improved with  Toprol -XL. Cardiology will schedule outpatient follow-up. Cardiology to send Zio patch at home for ambulatory heart rate monitoring.   Sleep apnea: Using CPAP at night.   Morbid obesity: Recently started on Ozempic.   Type 2 diabetes: Well-controlled.  On glipizide .   Back pain: Chronic pain issues.  She is going to follow-up with pain management clinic.  Ordered lidocaine  patch.  Avoid narcotics.  Prescribed. Medically stable for discharge today.  Discharge Diagnoses:  Principal Problem:   Chest pain Active Problems:   COPD (chronic obstructive pulmonary disease) (HCC)   Hypertension   Acute on chronic diastolic CHF (congestive heart failure) (HCC)   Nonrheumatic mitral valve regurgitation   Type 2 diabetes mellitus with obesity (HCC)   Morbid obesity (HCC)   Iron deficiency anemia   OSA (obstructive sleep apnea)   Hypokalemia   Hypoalbuminemia   Thrombocytosis   Grade I diastolic dysfunction   Palpitations    Discharge Instructions  Discharge Instructions     Call MD for:  difficulty breathing, headache or visual disturbances   Complete by: As directed    Diet - low sodium heart healthy   Complete by: As directed    Diet Carb Modified   Complete by: As directed    Increase activity slowly   Complete by: As directed       Allergies as of 02/27/2024       Reactions   Contrast Media [iodinated Contrast Media] Other (See Comments)   Coughing, sick to stomach, increase blood pressure    Empagliflozin Other (See Comments)   Yeast infection (reaction to Jardiance)   Losartan  Other (See Comments)   Back pain   Torsemide  Itching   Pt takes this medication because nothing else works ( takes cortisone cream for the itching)   Lisinopril  Nausea And Vomiting   Metformin  Palpitations   Ondansetron Swelling  Medication List     STOP taking these medications    amLODipine  5 MG tablet Commonly known as: NORVASC    benzonatate  100 MG capsule Commonly  known as: TESSALON    carvedilol  6.25 MG tablet Commonly known as: COREG    guaiFENesin  600 MG 12 hr tablet Commonly known as: MUCINEX    naproxen  500 MG tablet Commonly known as: NAPROSYN    Potassium 99 MG Tabs       TAKE these medications    acetaminophen  500 MG tablet Commonly known as: TYLENOL  Take 500 mg by mouth daily.   albuterol  108 (90 Base) MCG/ACT inhaler Commonly known as: VENTOLIN  HFA Inhale 2 puffs into the lungs every 4 (four) hours as needed for wheezing or shortness of breath.   allopurinol 100 MG tablet Commonly known as: ZYLOPRIM Take 100 mg by mouth daily as needed.   B-12 PO Take 1 tablet by mouth daily.   blood glucose meter kit and supplies Kit Dispense based on patient and insurance preference. Use up to four times daily as directed.   budesonide-formoterol  160-4.5 MCG/ACT inhaler Commonly known as: SYMBICORT Inhale 2 puffs into the lungs every morning.   diclofenac Sodium 1 % Gel Commonly known as: VOLTAREN Apply 1 application  topically See admin instructions. Apply topically every morning and every evening as needed for pain   ergocalciferol  1.25 MG (50000 UT) capsule Commonly known as: VITAMIN D2 Take 50,000 Units by mouth once a week.   ferrous sulfate  325 (65 FE) MG tablet Take 1 tablet (325 mg total) by mouth daily with breakfast.   fluconazole  100 MG tablet Commonly known as: Diflucan  Take 1 tablet (100 mg total) by mouth once a week for 2 doses.   fluticasone  50 MCG/ACT nasal spray Commonly known as: FLONASE  Place 2 sprays into both nostrils daily. What changed:  when to take this reasons to take this   glipiZIDE  5 MG tablet Commonly known as: GLUCOTROL  Take 5 mg by mouth daily.   hydrocortisone cream 1 % Apply 1 application. topically 2 (two) times daily as needed for itching.   lidocaine  5 % Commonly known as: LIDODERM  Place 1 patch onto the skin daily. Remove & Discard patch within 12 hours or as directed by MD    metoprolol  succinate 25 MG 24 hr tablet Commonly known as: TOPROL -XL Take 1 tablet (25 mg total) by mouth daily.   Ozempic (2 MG/DOSE) 8 MG/3ML Sopn Generic drug: Semaglutide (2 MG/DOSE) Inject 2 mg into the skin once a week.   polyethylene glycol 17 g packet Commonly known as: MIRALAX  / GLYCOLAX  Take 17 g by mouth daily.   sacubitril -valsartan  24-26 MG Commonly known as: ENTRESTO  Take 1 tablet by mouth 2 (two) times daily.   senna-docusate 8.6-50 MG tablet Commonly known as: Senokot-S Take 1 tablet by mouth 2 (two) times daily.   spironolactone  25 MG tablet Commonly known as: ALDACTONE  Take 1 tablet (25 mg total) by mouth daily.   torsemide  20 MG tablet Commonly known as: DEMADEX  Take 2 tablets (40 mg total) by mouth 2 (two) times daily. What changed:  how much to take when to take this        Allergies  Allergen Reactions   Contrast Media [Iodinated Contrast Media] Other (See Comments)    Coughing, sick to stomach, increase blood pressure    Empagliflozin Other (See Comments)    Yeast infection (reaction to Jardiance)   Losartan  Other (See Comments)    Back pain   Torsemide  Itching  Pt takes this medication because nothing else works ( takes cortisone cream for the itching)   Lisinopril  Nausea And Vomiting   Metformin  Palpitations   Ondansetron Swelling    Consultations: Cardiology   Procedures/Studies: ECHOCARDIOGRAM COMPLETE Result Date: 02/24/2024    ECHOCARDIOGRAM REPORT   Patient Name:   Kathryn Mahoney Date of Exam: 02/24/2024 Medical Rec #:  [id number]       Height:       69.0 in Accession #:    [id number]      Weight:       280.0 lb Date of Birth:  [birth year]       BSA:          2.384 m Patient Age:    63 years        BP:           126/72 mmHg Patient Gender: F               HR:           89 bpm. Exam Location:  Inpatient Procedure: 2D Echo, Cardiac Doppler and Color Doppler (Both Spectral and Color            Flow Doppler were utilized during  procedure). Indications:    Dysnpea, Chest pain, CHF  History:        Patient has prior history of Echocardiogram examinations, most                 recent 07/22/2021. CHF, COPD; Risk Factors:Diabetes and                 Hypertension.  Sonographer:    Juanita Shaw Referring Phys: [id number] DAVID MANUEL ORTIZ IMPRESSIONS  1. Left ventricular ejection fraction, by estimation, is 55 to 60%. The left ventricle has normal function. The left ventricle has no regional wall motion abnormalities. The left ventricular internal cavity size was mildly dilated. Left ventricular diastolic parameters are consistent with Grade I diastolic dysfunction (impaired relaxation).  2. Right ventricular systolic function is normal. The right ventricular size is normal. There is normal pulmonary artery systolic pressure.  3. Left atrial size was mild to moderately dilated.  4. The mitral valve is normal in structure. Mild mitral valve regurgitation. No evidence of mitral stenosis.  5. The aortic valve is tricuspid. Aortic valve regurgitation is not visualized. No aortic stenosis is present.  6. The inferior vena cava is dilated in size with >50% respiratory variability, suggesting right atrial pressure of 8 mmHg. FINDINGS  Left Ventricle: Left ventricular ejection fraction, by estimation, is 55 to 60%. The left ventricle has normal function. The left ventricle has no regional wall motion abnormalities. The left ventricular internal cavity size was mildly dilated. There is  no left ventricular hypertrophy. Left ventricular diastolic parameters are consistent with Grade I diastolic dysfunction (impaired relaxation). Right Ventricle: The right ventricular size is normal. No increase in right ventricular wall thickness. Right ventricular systolic function is normal. There is normal pulmonary artery systolic pressure. The tricuspid regurgitant velocity is 1.93 m/s, and  with an assumed right atrial pressure of 8 mmHg, the estimated right ventricular  systolic pressure is 22.9 mmHg. Left Atrium: Left atrial size was mild to moderately dilated. Right Atrium: Right atrial size was normal in size. Pericardium: There is no evidence of pericardial effusion. Mitral Valve: The mitral valve is normal in structure. Mild mitral valve regurgitation. No evidence of mitral valve stenosis. MV peak gradient, 5.0 mmHg. The mean mitral valve gradient is  3.0 mmHg. Tricuspid Valve: The tricuspid valve is normal in structure. Tricuspid valve regurgitation is trivial. No evidence of tricuspid stenosis. Aortic Valve: The aortic valve is tricuspid. Aortic valve regurgitation is not visualized. No aortic stenosis is present. Aortic valve mean gradient measures 4.0 mmHg. Aortic valve peak gradient measures 7.1 mmHg. Aortic valve area, by VTI measures 2.42 cm. Pulmonic Valve: The pulmonic valve was normal in structure. Pulmonic valve regurgitation is not visualized. No evidence of pulmonic stenosis. Aorta: The aortic root is normal in size and structure. Venous: The inferior vena cava is dilated in size with greater than 50% respiratory variability, suggesting right atrial pressure of 8 mmHg. IAS/Shunts: No atrial level shunt detected by color flow Doppler.  LEFT VENTRICLE PLAX 2D LVIDd:         5.80 cm      Diastology LVIDs:         4.00 cm      LV e' medial:    8.59 cm/s LV PW:         1.00 cm      LV E/e' medial:  8.7 LV IVS:        0.80 cm      LV e' lateral:   8.49 cm/s LVOT diam:     2.20 cm      LV E/e' lateral: 8.8 LV SV:         62 LV SV Index:   26 LVOT Area:     3.80 cm  LV Volumes (MOD) LV vol d, MOD A2C: 172.0 ml LV vol d, MOD A4C: 258.0 ml LV vol s, MOD A2C: 76.1 ml LV vol s, MOD A4C: 98.9 ml LV SV MOD A2C:     95.9 ml LV SV MOD A4C:     258.0 ml LV SV MOD BP:      128.9 ml RIGHT VENTRICLE             IVC RV Basal diam:  3.70 cm     IVC diam: 2.00 cm RV S prime:     13.40 cm/s TAPSE (M-mode): 3.5 cm LEFT ATRIUM             Index        RIGHT ATRIUM           Index LA diam:         4.40 cm 1.85 cm/m   RA Area:     16.90 cm LA Vol (A2C):   55.8 ml 23.41 ml/m  RA Volume:   42.80 ml  17.96 ml/m LA Vol (A4C):   81.6 ml 34.23 ml/m LA Biplane Vol: 73.6 ml 30.88 ml/m  AORTIC VALVE                    PULMONIC VALVE AV Area (Vmax):    2.51 cm     PV Vmax:       0.88 m/s AV Area (Vmean):   2.26 cm     PV Peak grad:  3.1 mmHg AV Area (VTI):     2.42 cm AV Vmax:           133.00 cm/s AV Vmean:          97.100 cm/s AV VTI:            0.256 m AV Peak Grad:      7.1 mmHg AV Mean Grad:      4.0 mmHg LVOT Vmax:         87.70  cm/s LVOT Vmean:        57.700 cm/s LVOT VTI:          0.163 m LVOT/AV VTI ratio: 0.64  AORTA Ao Root diam: 3.20 cm Ao Asc diam:  3.00 cm MITRAL VALVE                TRICUSPID VALVE MV Area (PHT): 3.16 cm     TR Peak grad:   14.9 mmHg MV Area VTI:   2.92 cm     TR Vmax:        193.00 cm/s MV Peak grad:  5.0 mmHg MV Mean grad:  3.0 mmHg     SHUNTS MV Vmax:       1.12 m/s     Systemic VTI:  0.16 m MV Vmean:      87.9 cm/s    Systemic Diam: 2.20 cm MV Decel Time: 240 msec MR Peak grad: 99.0 mmHg MR Mean grad: 66.0 mmHg MR Vmax:      497.50 cm/s MR Vmean:     381.5 cm/s MV E velocity: 74.70 cm/s MV A velocity: 108.00 cm/s MV E/A ratio:  0.69 Maudine Sos MD Electronically signed by Maudine Sos MD Signature Date/Time: 02/24/2024/7:35:20 PM    Final    DG Chest 2 View Result Date: 02/23/2024 EXAM: 2 VIEW(S) XRAY OF THE CHEST 02/23/2024 11:04:41 PM COMPARISON: 11/10/2023 CLINICAL HISTORY: Chest pain and shortness of breath for two weeks, retaining fluid, takes lasix . FINDINGS: LUNGS AND PLEURA: Possible mild perihilar edema, equivocal. No consolidation. No pleural effusion. No pneumothorax. HEART AND MEDIASTINUM: Cardiomegaly. BONES AND SOFT TISSUES: No acute osseous abnormality. IMPRESSION: 1. Cardiomegaly with possible mild perihilar edema, equivocal. Electronically signed by: Zadie Herter MD 02/23/2024 11:12 PM EDT RP Workstation: [id number]   (Echo, Carotid,  EGD, Colonoscopy, ERCP)    Subjective: Patient seen and examined.  No overnight events.  Up about walking in the hallway and asking for discharge.  On room air.   Discharge Exam: Vitals:   02/27/24 0650 02/27/24 0753  BP: (!) 111/94   Pulse: 90   Resp: 18   Temp: (!) 97.4 F (36.3 C)   SpO2: 94% 96%   Vitals:   02/26/24 2045 02/26/24 2337 02/27/24 0650 02/27/24 0753  BP: 125/78  (!) 111/94   Pulse: 100 95 90   Resp: 18 18 18    Temp: 98.4 F (36.9 C)  (!) 97.4 F (36.3 C)   TempSrc: Oral  Oral   SpO2: 98% 97% 94% 96%  Weight:      Height:        General: Pt is alert, awake, not in acute distress Cardiovascular: RRR, S1/S2 +, no rubs, no gallops Respiratory: CTA bilaterally, no wheezing, no rhonchi, no added sounds. Abdominal: Soft, NT, ND, bowel sounds +, obese pendulous. Extremities: no edema, no cyanosis    The results of significant diagnostics from this hospitalization (including imaging, microbiology, ancillary and laboratory) are listed below for reference.     Microbiology: No results found for this or any previous visit (from the past 240 hours).   Labs: BNP (last 3 results) Recent Labs    02/23/24 2258  BNP 51.8   Basic Metabolic Panel: Recent Labs  Lab 02/23/24 2256 02/24/24 0828 02/25/24 0459 02/26/24 0446 02/27/24 0547  NA 141 140 137 138 137  K 3.3* 3.3* 4.1 3.5 3.6  CL 100 99 99 99 98  CO2 31 32 28 28 30   GLUCOSE 128* 131* 114* 110* 132*  BUN  18 16 19 19 21   CREATININE 0.80 0.60 0.60 1.03* 0.99  CALCIUM 9.4 8.9 8.9 9.1 9.5  MG 1.9  --   --   --   --    Liver Function Tests: Recent Labs  Lab 02/24/24 0828  AST 15  ALT 16  ALKPHOS 86  BILITOT 0.3  PROT 7.4  ALBUMIN 3.1*   No results for input(s): "LIPASE", "AMYLASE" in the last 168 hours. No results for input(s): "AMMONIA" in the last 168 hours. CBC: Recent Labs  Lab 02/23/24 2256 02/24/24 0828 02/25/24 0459  WBC 12.2* 11.3* 11.1*  HGB 11.4* 10.4* 10.7*  HCT 39.0  35.0* 37.0  MCV 86.9 85.8 88.1  PLT 525* 470* 456*   Cardiac Enzymes: No results for input(s): "CKTOTAL", "CKMB", "CKMBINDEX", "TROPONINI" in the last 168 hours. BNP: Invalid input(s): "POCBNP" CBG: Recent Labs  Lab 02/26/24 0731 02/26/24 1133 02/26/24 1618 02/26/24 2143 02/27/24 0741  GLUCAP 129* 117* 100* 124* 118*   D-Dimer No results for input(s): "DDIMER" in the last 72 hours. Hgb A1c No results for input(s): "HGBA1C" in the last 72 hours. Lipid Profile No results for input(s): "CHOL", "HDL", "LDLCALC", "TRIG", "CHOLHDL", "LDLDIRECT" in the last 72 hours. Thyroid function studies No results for input(s): "TSH", "T4TOTAL", "T3FREE", "THYROIDAB" in the last 72 hours.  Invalid input(s): "FREET3" Anemia work up Recent Labs    02/24/24 2112  FERRITIN 139  TIBC 305  IRON 37   Urinalysis    Component Value Date/Time   COLORURINE YELLOW 01/09/2022 1417   APPEARANCEUR HAZY (A) 01/09/2022 1417   LABSPEC 1.013 01/09/2022 1417   PHURINE 6.0 01/09/2022 1417   GLUCOSEU NEGATIVE 01/09/2022 1417   HGBUR LARGE (A) 01/09/2022 1417   BILIRUBINUR negative 07/06/2023 1619   KETONESUR negative 07/06/2023 1619   KETONESUR NEGATIVE 01/09/2022 1417   PROTEINUR negative 07/06/2023 1619   PROTEINUR NEGATIVE 01/09/2022 1417   UROBILINOGEN 0.2 07/06/2023 1619   UROBILINOGEN 0.2 01/16/2020 1152   NITRITE Negative 07/06/2023 1619   NITRITE NEGATIVE 01/09/2022 1417   LEUKOCYTESUR Negative 07/06/2023 1619   LEUKOCYTESUR NEGATIVE 01/09/2022 1417   Sepsis Labs Recent Labs  Lab 02/23/24 2256 02/24/24 0828 02/25/24 0459  WBC 12.2* 11.3* 11.1*   Microbiology No results found for this or any previous visit (from the past 240 hours).   Time coordinating discharge: 35 minutes  SIGNED:   Vada Garibaldi, MD  [facility] 02/27/2024, 9:02 AM

## 2024-02-27 NOTE — Progress Notes (Signed)
 Rounding Note    Patient Name: Kathryn Mahoney Date of Encounter: 02/27/2024  [facility] Cardiologist: Sheryle Donning, MD   Subjective   SOB has improved  Inpatient Medications    Scheduled Meds:  enoxaparin  (LOVENOX ) injection  40 mg Subcutaneous Q24H   ferrous sulfate   325 mg Oral Q breakfast   fluticasone  furoate-vilanterol  1 puff Inhalation Daily   glipiZIDE   5 mg Oral Daily   lidocaine   1 patch Transdermal Q24H   metoprolol  succinate  25 mg Oral Daily   polyethylene glycol  17 g Oral Daily   sacubitril -valsartan   1 tablet Oral BID   senna-docusate  1 tablet Oral BID   spironolactone   25 mg Oral Daily   torsemide   40 mg Oral BID   Continuous Infusions:  PRN Meds: acetaminophen  **OR** acetaminophen    Vital Signs    Vitals:   02/26/24 1333 02/26/24 2045 02/26/24 2337 02/27/24 0650  BP: 96/63 125/78  (!) 111/94  Pulse: 93 100 95 90  Resp: 16 18 18 18   Temp: 98.6 F (37 C) 98.4 F (36.9 C)  (!) 97.4 F (36.3 C)  TempSrc: Oral Oral  Oral  SpO2: 98% 98% 97% 94%  Weight:      Height:        Intake/Output Summary (Last 24 hours) at 02/27/2024 0732 Last data filed at 02/27/2024 0600 Gross per 24 hour  Intake 1080 ml  Output --  Net 1080 ml      02/23/2024   10:30 PM 11/10/2023    3:37 PM 10/29/2022    1:32 PM  Last 3 Weights  Weight (lbs) 280 lb 280 lb 288 lb 12.8 oz  Weight (kg) 127.007 kg 127.007 kg 131 kg      Telemetry    SR, PVCs - Personally Reviewed  ECG    N/a - Personally Reviewed  Physical Exam   GEN: No acute distress.   Neck: No JVD Cardiac: RRR, no murmurs, rubs, or gallops.  Respiratory: Clear to auscultation bilaterally. GI: Soft, nontender, non-distended  MS: No edema; No deformity. Neuro:  Nonfocal  Psych: Normal affect   Labs    High Sensitivity Troponin:   Recent Labs  Lab 02/23/24 2256 02/24/24 0109  TROPONINIHS 7 7     Chemistry Recent Labs  Lab 02/23/24 2256 02/24/24 0828 02/25/24 0459  02/26/24 0446 02/27/24 0547  NA 141 140 137 138 137  K 3.3* 3.3* 4.1 3.5 3.6  CL 100 99 99 99 98  CO2 31 32 28 28 30   GLUCOSE 128* 131* 114* 110* 132*  BUN 18 16 19 19 21   CREATININE 0.80 0.60 0.60 1.03* 0.99  CALCIUM 9.4 8.9 8.9 9.1 9.5  MG 1.9  --   --   --   --   PROT  --  7.4  --   --   --   ALBUMIN  --  3.1*  --   --   --   AST  --  15  --   --   --   ALT  --  16  --   --   --   ALKPHOS  --  86  --   --   --   BILITOT  --  0.3  --   --   --   GFRNONAA >60 >60 >60 >60 >60  ANIONGAP 10 9 10 11 9     Lipids No results for input(s): "CHOL", "TRIG", "HDL", "LABVLDL", "LDLCALC", "CHOLHDL" in the last 168  hours.  Hematology Recent Labs  Lab 02/23/24 2256 02/24/24 0828 02/25/24 0459  WBC 12.2* 11.3* 11.1*  RBC 4.49 4.08 4.20  HGB 11.4* 10.4* 10.7*  HCT 39.0 35.0* 37.0  MCV 86.9 85.8 88.1  MCH 25.4* 25.5* 25.5*  MCHC 29.2* 29.7* 28.9*  RDW 16.9* 16.9* 16.9*  PLT 525* 470* 456*   Thyroid No results for input(s): "TSH", "FREET4" in the last 168 hours.  BNP Recent Labs  Lab 02/23/24 2258  BNP 51.8    DDimer No results for input(s): "DDIMER" in the last 168 hours.   Radiology    No results found.  Cardiac Studies    Patient Profile    Kathryn Mahoney is a 63 y.o. female with a hx of chronic diastolic heart failure, morbid obesity, HTN, OSA on CPAP, pulmonary HTN, type 2 DM, COPD, asthma who is being seen 02/24/2024 for the evaluation of chest pain, shortness of breath at the request of Dr. Bonita Bussing.   Assessment & Plan    1.Acute on chronic HFpEF - 01/2024 echo: LVEF 55-60%, grade I dd, normal RV - BNP 52, CXR possible mild perihilar edema - management complicated by medication noncompliance  - received IV lasix  80mg  x 1 yesterday, followed by oral torsemide  40mg  in the evening. Currently on oral torsemide  40mg  bid. I/Os are incomplete. Has not had recent weight, reordered. Renal function is stable. Symptoms have improved - volume status difficult to assess by exam  due to body habitus. - followed at [facility], has been on more a typical HFrEF regimen, I am not sure if at some point in the past she had systolic dysfunction. Not readily apparent from several echos reviewed in system  - continue toprol  25, entresot 24/26mg  bid, aldactone  25mg  daily. Not on SGLT2i given prior yeast infections.    2. HTN - at goal continue current meds  3. Mild to moderate MR - follow as outpatient  4.Palpitations - she is on toprol  - plans are for outpatient 2 week zio patch  Ok for discharge from cards standpoint, we will arrange f/u and arrange a 2 week zio patch. We will sign off inpatient care.   For questions or updates, please contact [facility] Please consult [email or web address] for contact info under        Signed, Armida Lander, MD  02/27/2024, 7:32 AM

## 2024-02-27 NOTE — Plan of Care (Signed)
 Patient ID: Kathryn Mahoney, female   DOB: 1961/05/22, 63 y.o.   MRN: [id number]  Problem: Education: Goal: Ability to describe self-care measures that may prevent or decrease complications (Diabetes Survival Skills Education) will improve Outcome: Adequate for Discharge Goal: Individualized Educational Video(s) Outcome: Adequate for Discharge   Problem: Coping: Goal: Ability to adjust to condition or change in health will improve Outcome: Adequate for Discharge   Problem: Fluid Volume: Goal: Ability to maintain a balanced intake and output will improve Outcome: Adequate for Discharge   Problem: Health Behavior/Discharge Planning: Goal: Ability to identify and utilize available resources and services will improve Outcome: Adequate for Discharge Goal: Ability to manage health-related needs will improve Outcome: Adequate for Discharge   Problem: Metabolic: Goal: Ability to maintain appropriate glucose levels will improve Outcome: Adequate for Discharge   Problem: Nutritional: Goal: Maintenance of adequate nutrition will improve Outcome: Adequate for Discharge Goal: Progress toward achieving an optimal weight will improve Outcome: Adequate for Discharge   Problem: Skin Integrity: Goal: Risk for impaired skin integrity will decrease Outcome: Adequate for Discharge   Problem: Tissue Perfusion: Goal: Adequacy of tissue perfusion will improve Outcome: Adequate for Discharge   Problem: Education: Goal: Knowledge of General Education information will improve Description: Including pain rating scale, medication(s)/side effects and non-pharmacologic comfort measures Outcome: Adequate for Discharge   Problem: Health Behavior/Discharge Planning: Goal: Ability to manage health-related needs will improve Outcome: Adequate for Discharge   Problem: Clinical Measurements: Goal: Ability to maintain clinical measurements within normal limits will improve Outcome: Adequate for  Discharge Goal: Will remain free from infection Outcome: Adequate for Discharge Goal: Diagnostic test results will improve Outcome: Adequate for Discharge Goal: Respiratory complications will improve Outcome: Adequate for Discharge Goal: Cardiovascular complication will be avoided Outcome: Adequate for Discharge   Problem: Activity: Goal: Risk for activity intolerance will decrease Outcome: Adequate for Discharge   Problem: Nutrition: Goal: Adequate nutrition will be maintained Outcome: Adequate for Discharge   Problem: Coping: Goal: Level of anxiety will decrease Outcome: Adequate for Discharge   Problem: Elimination: Goal: Will not experience complications related to bowel motility Outcome: Adequate for Discharge Goal: Will not experience complications related to urinary retention Outcome: Adequate for Discharge   Problem: Pain Managment: Goal: General experience of comfort will improve and/or be controlled Outcome: Adequate for Discharge   Problem: Safety: Goal: Ability to remain free from injury will improve Outcome: Adequate for Discharge   Problem: Skin Integrity: Goal: Risk for impaired skin integrity will decrease Outcome: Adequate for Discharge    Genella Kendall, RN

## 2024-02-29 ENCOUNTER — Ambulatory Visit: Attending: Cardiology

## 2024-02-29 DIAGNOSIS — R002 Palpitations: Secondary | ICD-10-CM

## 2024-02-29 NOTE — Progress Notes (Unsigned)
 Enrolled for [employer] to mail a ZIO XT long term holter monitor to the patients address on file. Requested deliver date of 03/05/24.  Dr. Letta Raw to read.

## 2024-03-15 ENCOUNTER — Ambulatory Visit (HOSPITAL_BASED_OUTPATIENT_CLINIC_OR_DEPARTMENT_OTHER): Admitting: Family

## 2024-06-16 ENCOUNTER — Emergency Department (HOSPITAL_COMMUNITY)

## 2024-06-16 ENCOUNTER — Encounter (HOSPITAL_COMMUNITY): Payer: Self-pay

## 2024-06-16 ENCOUNTER — Other Ambulatory Visit: Payer: Self-pay

## 2024-06-16 ENCOUNTER — Inpatient Hospital Stay (HOSPITAL_COMMUNITY)
Admission: EM | Admit: 2024-06-16 | Discharge: 2024-06-20 | DRG: 291 | Disposition: A | Discharge status: Home / Self Care | Attending: Internal Medicine | Admitting: Internal Medicine

## 2024-06-16 DIAGNOSIS — E66813 Obesity, class 3: Secondary | ICD-10-CM | POA: Diagnosis present

## 2024-06-16 DIAGNOSIS — Z823 Family history of stroke: Secondary | ICD-10-CM | POA: Diagnosis not present

## 2024-06-16 DIAGNOSIS — Z7985 Long-term (current) use of injectable non-insulin antidiabetic drugs: Secondary | ICD-10-CM

## 2024-06-16 DIAGNOSIS — Z7951 Long term (current) use of inhaled steroids: Secondary | ICD-10-CM

## 2024-06-16 DIAGNOSIS — Z91041 Radiographic dye allergy status: Secondary | ICD-10-CM | POA: Diagnosis not present

## 2024-06-16 DIAGNOSIS — D75839 Thrombocytosis, unspecified: Secondary | ICD-10-CM | POA: Diagnosis present

## 2024-06-16 DIAGNOSIS — D649 Anemia, unspecified: Secondary | ICD-10-CM | POA: Diagnosis present

## 2024-06-16 DIAGNOSIS — Z87891 Personal history of nicotine dependence: Secondary | ICD-10-CM

## 2024-06-16 DIAGNOSIS — J4489 Other specified chronic obstructive pulmonary disease: Secondary | ICD-10-CM | POA: Diagnosis present

## 2024-06-16 DIAGNOSIS — D72829 Elevated white blood cell count, unspecified: Secondary | ICD-10-CM | POA: Diagnosis present

## 2024-06-16 DIAGNOSIS — Z7984 Long term (current) use of oral hypoglycemic drugs: Secondary | ICD-10-CM

## 2024-06-16 DIAGNOSIS — I110 Hypertensive heart disease with heart failure: Principal | ICD-10-CM | POA: Diagnosis present

## 2024-06-16 DIAGNOSIS — B379 Candidiasis, unspecified: Secondary | ICD-10-CM | POA: Diagnosis present

## 2024-06-16 DIAGNOSIS — M549 Dorsalgia, unspecified: Secondary | ICD-10-CM | POA: Diagnosis present

## 2024-06-16 DIAGNOSIS — I5033 Acute on chronic diastolic (congestive) heart failure: Secondary | ICD-10-CM | POA: Diagnosis present

## 2024-06-16 DIAGNOSIS — J9611 Chronic respiratory failure with hypoxia: Secondary | ICD-10-CM | POA: Diagnosis present

## 2024-06-16 DIAGNOSIS — Z1152 Encounter for screening for COVID-19: Secondary | ICD-10-CM

## 2024-06-16 DIAGNOSIS — E119 Type 2 diabetes mellitus without complications: Secondary | ICD-10-CM | POA: Diagnosis present

## 2024-06-16 DIAGNOSIS — Z825 Family history of asthma and other chronic lower respiratory diseases: Secondary | ICD-10-CM

## 2024-06-16 DIAGNOSIS — Z79899 Other long term (current) drug therapy: Secondary | ICD-10-CM

## 2024-06-16 DIAGNOSIS — Z6841 Body Mass Index (BMI) 40.0 and over, adult: Secondary | ICD-10-CM

## 2024-06-16 DIAGNOSIS — N289 Disorder of kidney and ureter, unspecified: Secondary | ICD-10-CM | POA: Diagnosis present

## 2024-06-16 DIAGNOSIS — Z888 Allergy status to other drugs, medicaments and biological substances status: Secondary | ICD-10-CM | POA: Diagnosis not present

## 2024-06-16 DIAGNOSIS — G4733 Obstructive sleep apnea (adult) (pediatric): Secondary | ICD-10-CM | POA: Diagnosis present

## 2024-06-16 DIAGNOSIS — E8809 Other disorders of plasma-protein metabolism, not elsewhere classified: Secondary | ICD-10-CM | POA: Diagnosis present

## 2024-06-16 DIAGNOSIS — E876 Hypokalemia: Secondary | ICD-10-CM | POA: Diagnosis present

## 2024-06-16 DIAGNOSIS — I509 Heart failure, unspecified: Principal | ICD-10-CM

## 2024-06-16 LAB — HEPATIC FUNCTION PANEL
ALT: 17 U/L (ref 0–44)
AST: 16 U/L (ref 15–41)
Albumin: 3 g/dL — ABNORMAL LOW (ref 3.5–5.0)
Alkaline Phosphatase: 82 U/L (ref 38–126)
Bilirubin, Direct: <0.1 mg/dL (ref 0.0–0.2)
Total Bilirubin: 0.5 mg/dL (ref 0.0–1.2)
Total Protein: 7.8 g/dL (ref 6.5–8.1)

## 2024-06-16 LAB — BRAIN NATRIURETIC PEPTIDE: B Natriuretic Peptide: 64.2 pg/mL (ref 0.0–100.0)

## 2024-06-16 LAB — URINALYSIS, ROUTINE W REFLEX MICROSCOPIC
Bilirubin Urine: NEGATIVE
Glucose, UA: >=500 mg/dL — AB
Hgb urine dipstick: NEGATIVE
Ketones, ur: NEGATIVE mg/dL
Leukocytes,Ua: NEGATIVE
Nitrite: NEGATIVE
Protein, ur: NEGATIVE mg/dL
Specific Gravity, Urine: 1.025 (ref 1.005–1.030)
pH: 5 (ref 5.0–8.0)

## 2024-06-16 LAB — RESP PANEL BY RT-PCR (RSV, FLU A&B, COVID)  RVPGX2
Influenza A by PCR: NEGATIVE
Influenza B by PCR: NEGATIVE
Resp Syncytial Virus by PCR: NEGATIVE
SARS Coronavirus 2 by RT PCR: NEGATIVE

## 2024-06-16 LAB — BASIC METABOLIC PANEL WITH GFR
Anion gap: 9 (ref 5–15)
BUN: 14 mg/dL (ref 8–23)
CO2: 33 mmol/L — ABNORMAL HIGH (ref 22–32)
Calcium: 9.1 mg/dL (ref 8.9–10.3)
Chloride: 97 mmol/L — ABNORMAL LOW (ref 98–111)
Creatinine, Ser: 1 mg/dL (ref 0.44–1.00)
GFR, Estimated: >60 mL/min (ref >60)
Glucose, Bld: 118 mg/dL — ABNORMAL HIGH (ref 70–99)
Potassium: 3.3 mmol/L — ABNORMAL LOW (ref 3.5–5.1)
Sodium: 139 mmol/L (ref 135–145)

## 2024-06-16 LAB — CBC
HCT: 39.1 % (ref 36.0–46.0)
Hemoglobin: 11.3 g/dL — ABNORMAL LOW (ref 12.0–15.0)
MCH: 25.3 pg — ABNORMAL LOW (ref 26.0–34.0)
MCHC: 28.9 g/dL — ABNORMAL LOW (ref 30.0–36.0)
MCV: 87.5 fL (ref 80.0–100.0)
Platelets: 561 K/uL — ABNORMAL HIGH (ref 150–400)
RBC: 4.47 MIL/uL (ref 3.87–5.11)
RDW: 15.9 % — ABNORMAL HIGH (ref 11.5–15.5)
WBC: 11.5 K/uL — ABNORMAL HIGH (ref 4.0–10.5)
nRBC: 0 % (ref 0.0–0.2)

## 2024-06-16 LAB — TROPONIN I (HIGH SENSITIVITY)
Troponin I (High Sensitivity): 7 ng/L (ref <18)
Troponin I (High Sensitivity): 6 ng/L (ref <18)

## 2024-06-16 LAB — MAGNESIUM: Magnesium: 2.6 mg/dL — ABNORMAL HIGH (ref 1.7–2.4)

## 2024-06-16 MED ORDER — SODIUM CHLORIDE 0.9 % IV SOLN
500.0000 mg | Freq: Once | INTRAVENOUS | Status: AC
  Administered 2024-06-16: 500 mg via INTRAVENOUS
  Filled 2024-06-16: qty 5

## 2024-06-16 MED ORDER — FUROSEMIDE 10 MG/ML IJ SOLN
40.0000 mg | Freq: Once | INTRAMUSCULAR | Status: AC
  Administered 2024-06-16: 40 mg via INTRAVENOUS
  Filled 2024-06-16: qty 4

## 2024-06-16 MED ORDER — POTASSIUM CHLORIDE CRYS ER 20 MEQ PO TBCR
40.0000 meq | EXTENDED_RELEASE_TABLET | Freq: Once | ORAL | Status: AC
  Administered 2024-06-16: 40 meq via ORAL
  Filled 2024-06-16: qty 2

## 2024-06-16 MED ORDER — ACETAMINOPHEN 325 MG PO TABS
650.0000 mg | ORAL_TABLET | Freq: Once | ORAL | Status: AC
  Administered 2024-06-16: 650 mg via ORAL
  Filled 2024-06-16: qty 2

## 2024-06-16 MED ORDER — IPRATROPIUM-ALBUTEROL 0.5-2.5 (3) MG/3ML IN SOLN
3.0000 mL | Freq: Once | RESPIRATORY_TRACT | Status: AC
  Administered 2024-06-16: 3 mL via RESPIRATORY_TRACT
  Filled 2024-06-16: qty 3

## 2024-06-16 NOTE — H&P (Signed)
 History and Physical  Kathryn Mahoney FMW:[id number] DOB: 26-Oct-1961 DOA: 06/16/2024  PCP: [facility]   Chief Complaint: SOB, chest pain, abdominal distention  HPI: Kathryn Mahoney is a 63 y.o. female with medical history significant for chronic diastolic heart failure, asthma, B12 deficiency, class III obesity, COPD, type 2 diabetes, hypertension, frequent dyspnea, urinary incontinence and chronic hypoxemia on intermittent home oxygen who presents to the ED for evaluation of shortness of breath, chest pain and abdominal distention.  ED Course: Initial vitals show patient afebrile, normotensive with SpO2 100% on room air. Initial labs significant for K+ 3.3, WBC 11.5, BNP 64, troponin 7->6, negative flu RSV and COVID test, UA with significant glucosuria but no signs of infection. EKG shows sinus rhythm. CXR shows cardiomegaly with interstitial edema. Pt received Tylenol  650 mg x 1, IV azithromycin -milligram x 1, IV Lasix  40 mg x 1, DuoNeb and KCl 40 mEq x 1. TRH was consulted for admission.  Review of Systems: Please see HPI for pertinent positives and negatives. A complete 10 system review of systems are otherwise negative.  Past Medical History:  Diagnosis Date   Asthma    B12 deficiency    CHF (congestive heart failure) (HCC)    COPD (chronic obstructive pulmonary disease) (HCC)    Diabetes mellitus without complication (HCC)    Hypertension    Morbid obesity (HCC)    SOB (shortness of breath)    Urinary incontinence    Past Surgical History:  Procedure Laterality Date   CESAREAN SECTION     ECTOPIC PREGNANCY SURGERY     KNEE SURGERY     Social History:  reports that she quit smoking about 13 years ago. Her smoking use included cigarettes. She started smoking about 43 years ago. She has a 45 pack-year smoking history. She has never used smokeless tobacco. She reports that she does not drink alcohol  and does not use drugs.  Allergies  Allergen Reactions   Contrast  Media [Iodinated Contrast Media] Other (See Comments)    Coughing, sick to stomach, increase blood pressure    Empagliflozin Other (See Comments)    Yeast infection (reaction to Jardiance)   Losartan  Other (See Comments)    Back pain   Torsemide  Itching    Pt takes this medication because nothing else works ( takes cortisone cream for the itching)   Lisinopril  Nausea And Vomiting   Metformin  Palpitations   Ondansetron Swelling    Family History  Problem Relation Age of Onset   Stroke Mother    Asthma Mother      Prior to Admission medications   Medication Sig Start Date End Date Taking? Authorizing Provider  acetaminophen  (TYLENOL ) 500 MG tablet Take 500 mg by mouth daily.    [provider]  albuterol  (VENTOLIN  HFA) 108 (90 Base) MCG/ACT inhaler Inhale 2 puffs into the lungs every 4 (four) hours as needed for wheezing or shortness of breath. 01/22/22   Steinl, Kevin, MD  allopurinol (ZYLOPRIM) 100 MG tablet Take 100 mg by mouth daily as needed.    [provider]  blood glucose meter kit and supplies KIT Dispense based on patient and insurance preference. Use up to four times daily as directed. 07/25/21   Jerri Keys, MD  budesonide-formoterol  [facility]) 160-4.5 MCG/ACT inhaler Inhale 2 puffs into the lungs every morning. 01/07/22   [provider]  Cyanocobalamin (B-12 PO) Take 1 tablet by mouth daily.    [provider]  diclofenac Sodium (VOLTAREN) 1 % GEL Apply 1  application  topically See admin instructions. Apply topically every morning and every evening as needed for pain 01/07/22   [provider]  ergocalciferol  (VITAMIN D2) 1.25 MG (50000 UT) capsule Take 50,000 Units by mouth once a week.    [provider]  ferrous sulfate  325 (65 FE) MG tablet Take 1 tablet (325 mg total) by mouth daily with breakfast. 07/26/21   Jerri Keys, MD  fluticasone  (FLONASE ) 50 MCG/ACT nasal spray Place 2 sprays into both nostrils daily. Patient taking  differently: Place 2 sprays into both nostrils daily as needed for allergies or rhinitis. 02/19/22 04/25/22  Jeannetta Channing CROME, NP  glipiZIDE  (GLUCOTROL ) 5 MG tablet Take 5 mg by mouth daily. 01/07/22   [provider]  hydrocortisone cream 1 % Apply 1 application. topically 2 (two) times daily as needed for itching.    [provider]  lidocaine  (LIDODERM ) 5 % Place 1 patch onto the skin daily. Remove & Discard patch within 12 hours or as directed by MD 02/27/24   Raenelle Coria, MD  metoprolol  succinate (TOPROL -XL) 25 MG 24 hr tablet Take 1 tablet (25 mg total) by mouth daily. 02/27/24   Ghimire, Kuber, MD  OZEMPIC, 2 MG/DOSE, 8 MG/3ML SOPN Inject 2 mg into the skin once a week. 02/10/24   [provider]  polyethylene glycol (MIRALAX  / GLYCOLAX ) 17 g packet Take 17 g by mouth daily. 02/27/24   Raenelle Coria, MD  sacubitril -valsartan  (ENTRESTO ) 24-26 MG Take 1 tablet by mouth 2 (two) times daily. 02/27/24   Raenelle Coria, MD  senna-docusate (SENOKOT-S) 8.6-50 MG tablet Take 1 tablet by mouth 2 (two) times daily. 02/27/24   Raenelle Coria, MD  spironolactone  (ALDACTONE ) 25 MG tablet Take 1 tablet (25 mg total) by mouth daily. 02/27/24 05/27/24  Raenelle Coria, MD  torsemide  (DEMADEX ) 20 MG tablet Take 2 tablets (40 mg total) by mouth 2 (two) times daily. 02/27/24 03/28/24  Raenelle Coria, MD    Physical Exam: BP 123/72 (BP Location: Right Arm)   Pulse 97   Temp 98.5 F (36.9 C) (Oral)   Resp 19   Ht 5' 9 (1.753 m)   Wt 131.5 kg   SpO2 96%   BMI 42.83 kg/m  General: Pleasant, well-appearing *** laying in bed. No acute distress. HEENT: [place]/AT. Anicteric sclera CV: RRR. No murmurs, rubs, or gallops. No LE edema Pulmonary: Lungs CTAB. Normal effort. No wheezing or rales. Abdominal: Soft, nontender, nondistended. Normal bowel sounds. Extremities: Palpable radial and DP pulses. Normal ROM. Skin: Warm and dry. No obvious rash or lesions. Neuro: A&Ox3. Moves all extremities. Normal  sensation to light touch. No focal deficit. Psych: Normal mood and affect          Labs on Admission:  Basic Metabolic Panel: Recent Labs  Lab 06/16/24 1431 06/16/24 1801  NA 139  --   K 3.3*  --   CL 97*  --   CO2 33*  --   GLUCOSE 118*  --   BUN 14  --   CREATININE 1.00  --   CALCIUM 9.1  --   MG  --  2.6*   Liver Function Tests: Recent Labs  Lab 06/16/24 1431  AST 16  ALT 17  ALKPHOS 82  BILITOT 0.5  PROT 7.8  ALBUMIN 3.0*   No results for input(s): LIPASE, AMYLASE in the last 168 hours. No results for input(s): AMMONIA in the last 168 hours. CBC: Recent Labs  Lab 06/16/24 1431  WBC 11.5*  HGB  11.3*  HCT 39.1  MCV 87.5  PLT 561*   Cardiac Enzymes: No results for input(s): CKTOTAL, CKMB, CKMBINDEX, TROPONINI in the last 168 hours. BNP (last 3 results) Recent Labs    02/23/24 2258 06/16/24 1441  BNP 51.8 64.2    ProBNP (last 3 results) No results for input(s): PROBNP in the last 8760 hours.  CBG: No results for input(s): GLUCAP in the last 168 hours.  Radiological Exams on Admission: DG Chest 2 View Result Date: 06/16/2024 CLINICAL DATA:  chest pain EXAM: CHEST - 2 VIEW COMPARISON:  February 23, 2024 FINDINGS: Fine reticular opacities throughout both lungs. No focal airspace consolidation, pleural effusion, or pneumothorax. Mild cardiomegaly.No acute fracture or destructive lesion. Multilevel thoracic osteophytosis. IMPRESSION: Fine reticular opacities throughout both lungs, worrisome for a atypical infection or viral pneumonia. Alternatively, interstitial edema could also have this appearance in the correct clinical context. Electronically Signed   By: Rogelia Myers M.D.   On: 06/16/2024 15:53   Assessment/Plan Aaralyn Kil is a 63 y.o. female with medical history significant for chronic diastolic heart failure, asthma, B12 deficiency, class III obesity, COPD, type 2 diabetes, hypertension, frequent dyspnea, urinary incontinence  and chronic hypoxemia on intermittent home oxygen who presents to the ED for evaluation of shortness of breath, chest pain and abdominal distention and admitted for acute on chronic diastolic heart failure.  # Acute on chronic diastolic heart failure - Last TTE on *** shows EF *** - Pt presented with *** - Pt with *** signs of CHF exacerbation - Acute CHF likely 2/2 *** - Start IV lasix  *** - Continue *** - Follow up repeat *** - Strict I&O, daily weights - Maintain K+ > 4.0, Mag > 2.0 - Telemetry  # COPD  # T2DM  # HTN  #***  #***  # Class III obesity Body mass index is 42.83 kg/m. Filed Weights   06/16/24 1427  Weight: 131.5 kg  - F/u with PCP for weight lost and nutrition counseling   DVT prophylaxis: Lovenox      Code Status: Prior  Consults called: None  Family Communication: ***  Severity of Illness: The appropriate patient status for this patient is INPATIENT. Inpatient status is judged to be reasonable and necessary in order to provide the required intensity of service to ensure the patient's safety. The patient's presenting symptoms, physical exam findings, and initial radiographic and laboratory data in the context of their chronic comorbidities is felt to place them at high risk for further clinical deterioration. Furthermore, it is not anticipated that the patient will be medically stable for discharge from the hospital within 2 midnights of admission.   * I certify that at the point of admission it is my clinical judgment that the patient will require inpatient hospital care spanning beyond 2 midnights from the point of admission due to high intensity of service, high risk for further deterioration and high frequency of surveillance required.*  Level of care: Telemetry   This record has been created using Conservation officer, historic buildings. Errors have been sought and corrected, but may not always be located. Such creation errors do not reflect on the  standard of care.   Lou Claretta HERO, MD 06/16/2024, 10:05 PM [facility] Pager: [phone] Isaiah 41:10   If 7PM-7AM, please contact night-coverage [email or web address] Password [email or web address]

## 2024-06-16 NOTE — ED Provider Triage Note (Signed)
 Emergency Medicine Provider Triage Evaluation Note  Kathryn Mahoney , a 63 y.o. female  was evaluated in triage.  Pt complains of overall feeling bad, bloating in stomach, swelling in feet, chest pain, nausea and weakness, cough. Hx of HF and COPD.   Review of Systems  Positive: Chest pain, abdominal fullness, nausea, weakness, cough, swelling in feet  Negative: Fever, chills, vomiting  Physical Exam  BP 135/74   Pulse 98   Temp 98.8 F (37.1 C) (Oral)   Resp 19   Ht 5' 9 (1.753 m)   Wt 131.5 kg   SpO2 100%   BMI 42.83 kg/m  Gen:   Awake, no distress   Resp:  Normal effort  MSK:   Moves extremities without difficulty  Other:  Abdomen soft, nontender, patient talking in full sentences on room air  Medical Decision Making  Medically screening exam initiated at 3:41 PM.  Appropriate orders placed.  Nabiha Planck was informed that the remainder of the evaluation will be completed by another provider, this initial triage assessment does not replace that evaluation, and the importance of remaining in the ED until their evaluation is complete.  Orders: CBC, BMP, troponin, BNP, resp. Panel, cxr, EKG, hepatic function panel, urinalysis   Janetta Terrall [place], [place] 06/16/24 1545

## 2024-06-16 NOTE — ED Notes (Signed)
 Pt. Ambulates to the bathroom independently.

## 2024-06-16 NOTE — ED Provider Notes (Signed)
 [place] EMERGENCY DEPARTMENT AT [facility] Provider Note   CSN: [id number] Arrival date & time: 06/16/24  1419     Patient presents with: Chest Pain   Kathryn Mahoney is a 63 y.o. female.   Patient with history of CHF, COPD, diabetes, hypertension, morbid obesity, OSA on CPAP presents today with complaints of chest pain, shortness of breath. Reports symptoms began over the past day or so. She reports she has been gaining weight over the last few weeks, and is unsure if this is fluid related or not.  She has been taking her torsemide  as prescribed.  Reports she feels like her abdomen is more distended than it normally is.  Reports that when this normally happens she needs IV diuresis to feel better.  She does have oxygen as needed at home.  Does report a mild cough, reports she is unsure if this is infectious, COPD, or heart failure related.  She has been trying her nebulizer treatments without significant change.  Denies any fevers or chills.  Denies nausea, vomiting, or diarrhea.  No leg pain, recent travel, or recent surgeries.   The history is provided by the patient. No language interpreter was used.  Chest Pain Associated symptoms: shortness of breath        Prior to Admission medications   Medication Sig Start Date End Date Taking? Authorizing Provider  acetaminophen  (TYLENOL ) 500 MG tablet Take 500 mg by mouth daily.    [provider]  albuterol  (VENTOLIN  HFA) 108 (90 Base) MCG/ACT inhaler Inhale 2 puffs into the lungs every 4 (four) hours as needed for wheezing or shortness of breath. 01/22/22   Steinl, Kevin, MD  allopurinol (ZYLOPRIM) 100 MG tablet Take 100 mg by mouth daily as needed.    [provider]  blood glucose meter kit and supplies KIT Dispense based on patient and insurance preference. Use up to four times daily as directed. 07/25/21   Jerri Keys, MD  budesonide-formoterol  [facility]) 160-4.5 MCG/ACT inhaler Inhale 2 puffs into the  lungs every morning. 01/07/22   [provider]  Cyanocobalamin (B-12 PO) Take 1 tablet by mouth daily.    [provider]  diclofenac Sodium (VOLTAREN) 1 % GEL Apply 1 application  topically See admin instructions. Apply topically every morning and every evening as needed for pain 01/07/22   [provider]  ergocalciferol  (VITAMIN D2) 1.25 MG (50000 UT) capsule Take 50,000 Units by mouth once a week.    [provider]  ferrous sulfate  325 (65 FE) MG tablet Take 1 tablet (325 mg total) by mouth daily with breakfast. 07/26/21   Jerri Keys, MD  fluticasone  (FLONASE ) 50 MCG/ACT nasal spray Place 2 sprays into both nostrils daily. Patient taking differently: Place 2 sprays into both nostrils daily as needed for allergies or rhinitis. 02/19/22 04/25/22  Jeannetta Channing CROME, NP  glipiZIDE  (GLUCOTROL ) 5 MG tablet Take 5 mg by mouth daily. 01/07/22   [provider]  hydrocortisone cream 1 % Apply 1 application. topically 2 (two) times daily as needed for itching.    [provider]  lidocaine  (LIDODERM ) 5 % Place 1 patch onto the skin daily. Remove & Discard patch within 12 hours or as directed by MD 02/27/24   Raenelle Coria, MD  metoprolol  succinate (TOPROL -XL) 25 MG 24 hr tablet Take 1 tablet (25 mg total) by mouth daily. 02/27/24   Ghimire, Kuber, MD  OZEMPIC, 2 MG/DOSE, 8 MG/3ML SOPN Inject 2 mg into the skin once a  week. 02/10/24   [provider]  polyethylene glycol (MIRALAX  / GLYCOLAX ) 17 g packet Take 17 g by mouth daily. 02/27/24   Ghimire, Kuber, MD  sacubitril -valsartan  (ENTRESTO ) 24-26 MG Take 1 tablet by mouth 2 (two) times daily. 02/27/24   Raenelle Coria, MD  senna-docusate (SENOKOT-S) 8.6-50 MG tablet Take 1 tablet by mouth 2 (two) times daily. 02/27/24   Ghimire, Kuber, MD  spironolactone  (ALDACTONE ) 25 MG tablet Take 1 tablet (25 mg total) by mouth daily. 02/27/24 05/27/24  Raenelle Coria, MD  torsemide  (DEMADEX ) 20 MG tablet Take 2 tablets (40 mg  total) by mouth 2 (two) times daily. 02/27/24 03/28/24  Raenelle Coria, MD    Allergies: Contrast media [iodinated contrast media], Empagliflozin, Losartan , Torsemide , Lisinopril , Metformin , and Ondansetron    Review of Systems  Respiratory:  Positive for shortness of breath.   Cardiovascular:  Positive for chest pain.  All other systems reviewed and are negative.   Updated Vital Signs BP 122/77   Pulse 92   Temp 98.5 F (36.9 C) (Oral)   Resp 18   Ht 5' 9 (1.753 m)   Wt 131.5 kg   SpO2 95%   BMI 42.83 kg/m   Physical Exam Vitals and nursing note reviewed.  Constitutional:      General: She is not in acute distress.    Appearance: Normal appearance. She is normal weight. She is not ill-appearing, toxic-appearing or diaphoretic.  HENT:     Head: Normocephalic and atraumatic.  Cardiovascular:     Rate and Rhythm: Normal rate and regular rhythm.     Heart sounds: Normal heart sounds.  Pulmonary:     Effort: Pulmonary effort is normal. No respiratory distress.     Comments: Speaking complete sentences on room air Abdominal:     General: There is distension.     Palpations: Abdomen is soft.  Musculoskeletal:        General: Normal range of motion.     Cervical back: Normal range of motion.     Comments: Trace BLE edema  Skin:    General: Skin is warm and dry.  Neurological:     General: No focal deficit present.     Mental Status: She is alert.  Psychiatric:        Mood and Affect: Mood normal.        Behavior: Behavior normal.     (all labs ordered are listed, but only abnormal results are displayed) Labs Reviewed  BASIC METABOLIC PANEL WITH GFR - Abnormal; Notable for the following components:      Result Value   Potassium 3.3 (*)    Chloride 97 (*)    CO2 33 (*)    Glucose, Bld 118 (*)    All other components within normal limits  CBC - Abnormal; Notable for the following components:   WBC 11.5 (*)    Hemoglobin 11.3 (*)    MCH 25.3 (*)    MCHC 28.9 (*)     RDW 15.9 (*)    Platelets 561 (*)    All other components within normal limits  URINALYSIS, ROUTINE W REFLEX MICROSCOPIC - Abnormal; Notable for the following components:   Glucose, UA >=500 (*)    Bacteria, UA RARE (*)    All other components within normal limits  HEPATIC FUNCTION PANEL - Abnormal; Notable for the following components:   Albumin 3.0 (*)    All other components within normal limits  MAGNESIUM - Abnormal; Notable for the following components:  Magnesium 2.6 (*)    All other components within normal limits  RESP PANEL BY RT-PCR (RSV, FLU A&B, COVID)  [id number]  BRAIN NATRIURETIC PEPTIDE  TROPONIN I (HIGH SENSITIVITY)  TROPONIN I (HIGH SENSITIVITY)    EKG: None  Radiology: DG Chest 2 View Result Date: 06/16/2024 CLINICAL DATA:  chest pain EXAM: CHEST - 2 VIEW COMPARISON:  February 23, 2024 FINDINGS: Fine reticular opacities throughout both lungs. No focal airspace consolidation, pleural effusion, or pneumothorax. Mild cardiomegaly.No acute fracture or destructive lesion. Multilevel thoracic osteophytosis. IMPRESSION: Fine reticular opacities throughout both lungs, worrisome for a atypical infection or viral pneumonia. Alternatively, interstitial edema could also have this appearance in the correct clinical context. Electronically Signed   By: Rogelia Myers M.D.   On: 06/16/2024 15:53     Procedures   Medications Ordered in the ED  acetaminophen  (TYLENOL ) tablet 650 mg (650 mg Oral Given 06/16/24 1547)  potassium chloride  SA (KLOR-CON  M) CR tablet 40 mEq (40 mEq Oral Given 06/16/24 1947)  furosemide  (LASIX ) injection 40 mg (40 mg Intravenous Given 06/16/24 1953)  ipratropium-albuterol  (DUONEB) 0.5-2.5 (3) MG/3ML nebulizer solution 3 mL (3 mLs Nebulization Given 06/16/24 1946)  azithromycin  (ZITHROMAX ) 500 mg in sodium chloride  0.9 % 250 mL IVPB (500 mg Intravenous New Bag/Given 06/16/24 1958)                                    Medical Decision Making Amount and/or  Complexity of Data Reviewed Labs: ordered. Radiology: ordered.  Risk Prescription drug management.   This patient is a 63 y.o. female who presents to the ED for concern of chest pain, shortness of breath, this involves an extensive number of treatment options, and is a complaint that carries with it a high risk of complications and morbidity. The emergent differential diagnosis prior to evaluation includes, but is not limited to,  CHF, pericardial effusion/tamponade, arrhythmias, ACS, COPD, asthma, bronchitis, pneumonia, pneumothorax, PE, anemia  This is not an exhaustive differential.   Past Medical History / Co-morbidities / Social History:  has a past medical history of Asthma, B12 deficiency, CHF (congestive heart failure) (HCC), COPD (chronic obstructive pulmonary disease) (HCC), Diabetes mellitus without complication (HCC), Hypertension, Morbid obesity (HCC), SOB (shortness of breath), and Urinary incontinence.  Additional history: Chart reviewed. Pertinent results include: seen an evaluated by me in April 2025 for very similar complaints, was admitted for IV diuresis  Physical Exam: Physical exam performed. The pertinent findings include: abdominal distention  Lab Tests: I ordered, and personally interpreted labs.  The pertinent results include:  WBC 11.5, hgb 11.3, platelets 561 consistent with previous. BNP WNL however looks like it is always normal despite clinical signs of fluid overload. K 3.3   Imaging Studies: I ordered imaging studies including CXR. I independently visualized and interpreted imaging which showed   Fine reticular opacities throughout both lungs, worrisome for a atypical infection or viral pneumonia. Alternatively, interstitial edema could also have this appearance in the correct clinical context.  I agree with the radiologist interpretation.   Cardiac Monitoring:  The patient was maintained on a cardiac monitor.  Cardiac monitor showed an underlying  rhythm of: sinus rhythm, no STEMI. I agree with this interpretation.   Medications: I ordered medication including azithromycin , Lasix , duoneb, oral potassium  for fluid overload, infectious coverage given CXR findings, hypokalemia . Reevaluation of the patient after these medicines showed that the patient improved. I have  reviewed the patients home medicines and have made adjustments as needed.   Disposition: After consideration of the diagnostic results and the patients response to treatment, I feel that patient will require admission for further diuresis +/- antibiotics for possible infection seen on CXR.  After above interventions, patient attempted to ambulate in the hall and became significantly tachycardic and dyspneic.  Her oxygen remained at 95% on room air, however she feels unwell and is requesting admission.   Discussed patient with hospitalist Dr. Lou who accepts patient for admission  Final diagnoses:  Acute on chronic congestive heart failure, unspecified heart failure type [facility])    ED Discharge Orders     None          Nora Lauraine DELENA DEVONNA 06/16/24 2232    Francesca Elsie CROME, MD 06/17/24 1310

## 2024-06-16 NOTE — ED Triage Notes (Addendum)
 Patient has had right sided chest pain since yesterday. No radiation. Feels nauseous and weak. Has had a dry cough. Stated she has heart failure and COPD.

## 2024-06-16 NOTE — ED Notes (Signed)
 Walked patient. Sp02  94, Pulse 125.She stated she felt weak while ambulating.

## 2024-06-16 NOTE — ED Notes (Signed)
 Patient is aware of need for UA sample

## 2024-06-17 DIAGNOSIS — I5033 Acute on chronic diastolic (congestive) heart failure: Secondary | ICD-10-CM

## 2024-06-17 DIAGNOSIS — E66813 Obesity, class 3: Secondary | ICD-10-CM | POA: Diagnosis not present

## 2024-06-17 LAB — MAGNESIUM: Magnesium: 2.3 mg/dL (ref 1.7–2.4)

## 2024-06-17 LAB — CBC
HCT: 35 % — ABNORMAL LOW (ref 36.0–46.0)
Hemoglobin: 10.3 g/dL — ABNORMAL LOW (ref 12.0–15.0)
MCH: 25.8 pg — ABNORMAL LOW (ref 26.0–34.0)
MCHC: 29.4 g/dL — ABNORMAL LOW (ref 30.0–36.0)
MCV: 87.7 fL (ref 80.0–100.0)
Platelets: 481 K/uL — ABNORMAL HIGH (ref 150–400)
RBC: 3.99 MIL/uL (ref 3.87–5.11)
RDW: 15.7 % — ABNORMAL HIGH (ref 11.5–15.5)
WBC: 10.8 K/uL — ABNORMAL HIGH (ref 4.0–10.5)
nRBC: 0 % (ref 0.0–0.2)

## 2024-06-17 LAB — BASIC METABOLIC PANEL WITH GFR
Anion gap: 9 (ref 5–15)
BUN: 14 mg/dL (ref 8–23)
CO2: 31 mmol/L (ref 22–32)
Calcium: 8.6 mg/dL — ABNORMAL LOW (ref 8.9–10.3)
Chloride: 96 mmol/L — ABNORMAL LOW (ref 98–111)
Creatinine, Ser: 1.07 mg/dL — ABNORMAL HIGH (ref 0.44–1.00)
GFR, Estimated: 58 mL/min — ABNORMAL LOW (ref >60)
Glucose, Bld: 177 mg/dL — ABNORMAL HIGH (ref 70–99)
Potassium: 2.9 mmol/L — ABNORMAL LOW (ref 3.5–5.1)
Sodium: 136 mmol/L (ref 135–145)

## 2024-06-17 LAB — CBG MONITORING, ED
Glucose-Capillary: 89 mg/dL (ref 70–99)
Glucose-Capillary: 101 mg/dL — ABNORMAL HIGH (ref 70–99)
Glucose-Capillary: 85 mg/dL (ref 70–99)

## 2024-06-17 LAB — HEMOGLOBIN A1C
Hgb A1c MFr Bld: 6.2 % — ABNORMAL HIGH (ref 4.8–5.6)
Mean Plasma Glucose: 131.24 mg/dL

## 2024-06-17 LAB — GLUCOSE, CAPILLARY: Glucose-Capillary: 102 mg/dL — ABNORMAL HIGH (ref 70–99)

## 2024-06-17 LAB — PHOSPHORUS: Phosphorus: 3.1 mg/dL (ref 2.5–4.6)

## 2024-06-17 MED ORDER — FLUTICASONE PROPIONATE 50 MCG/ACT NA SUSP: 2.0000 | Freq: Every day | NASAL | Status: DC | PRN

## 2024-06-17 MED ORDER — ENOXAPARIN SODIUM 40 MG/0.4ML IJ SOSY
40.0000 mg | PREFILLED_SYRINGE | INTRAMUSCULAR | Status: DC
  Administered 2024-06-17 – 2024-06-20 (×4): 40 mg via SUBCUTANEOUS
  Filled 2024-06-17 (×4): qty 0.4

## 2024-06-17 MED ORDER — LIVING BETTER WITH HEART FAILURE BOOK: Freq: Once | Status: DC

## 2024-06-17 MED ORDER — POTASSIUM CHLORIDE 10 MEQ/100ML IV SOLN
10.0000 meq | INTRAVENOUS | Status: AC
  Administered 2024-06-17 (×2): 10 meq via INTRAVENOUS
  Filled 2024-06-17: qty 100

## 2024-06-17 MED ORDER — GUAIFENESIN-DM 100-10 MG/5ML PO SYRP
5.0000 mL | ORAL_SOLUTION | ORAL | Status: DC | PRN
  Administered 2024-06-17 – 2024-06-19 (×4): 5 mL via ORAL
  Filled 2024-06-17 (×4): qty 10

## 2024-06-17 MED ORDER — AMLODIPINE BESYLATE 10 MG PO TABS
5.0000 mg | ORAL_TABLET | Freq: Every day | ORAL | Status: DC
  Administered 2024-06-17 – 2024-06-20 (×4): 5 mg via ORAL
  Filled 2024-06-17 (×4): qty 1

## 2024-06-17 MED ORDER — POTASSIUM CHLORIDE CRYS ER 20 MEQ PO TBCR
20.0000 meq | EXTENDED_RELEASE_TABLET | Freq: Every day | ORAL | Status: DC
  Administered 2024-06-17 – 2024-06-20 (×3): 20 meq via ORAL
  Filled 2024-06-17 (×4): qty 1

## 2024-06-17 MED ORDER — SENNOSIDES-DOCUSATE SODIUM 8.6-50 MG PO TABS: 1.0000 | ORAL_TABLET | Freq: Every evening | ORAL | Status: DC | PRN

## 2024-06-17 MED ORDER — LIDOCAINE 5 % EX PTCH
1.0000 | MEDICATED_PATCH | CUTANEOUS | Status: AC
  Administered 2024-06-17 – 2024-06-18 (×2): 1 via TRANSDERMAL
  Filled 2024-06-17 (×2): qty 1

## 2024-06-17 MED ORDER — FLUTICASONE FUROATE-VILANTEROL 200-25 MCG/ACT IN AEPB
1.0000 | INHALATION_SPRAY | Freq: Every day | RESPIRATORY_TRACT | Status: DC
  Administered 2024-06-17 – 2024-06-20 (×4): 1 via RESPIRATORY_TRACT
  Filled 2024-06-17 (×3): qty 28

## 2024-06-17 MED ORDER — IPRATROPIUM-ALBUTEROL 0.5-2.5 (3) MG/3ML IN SOLN
3.0000 mL | Freq: Four times a day (QID) | RESPIRATORY_TRACT | Status: DC | PRN
  Administered 2024-06-17 – 2024-06-20 (×5): 3 mL via RESPIRATORY_TRACT
  Filled 2024-06-17 (×5): qty 3

## 2024-06-17 MED ORDER — BISACODYL 5 MG PO TBEC: 5.0000 mg | DELAYED_RELEASE_TABLET | Freq: Every day | ORAL | Status: DC | PRN

## 2024-06-17 MED ORDER — POTASSIUM CHLORIDE CRYS ER 20 MEQ PO TBCR
40.0000 meq | EXTENDED_RELEASE_TABLET | Freq: Two times a day (BID) | ORAL | Status: AC
Start: 2024-06-17 — End: 2024-06-17
  Administered 2024-06-17 (×2): 40 meq via ORAL
  Filled 2024-06-17 (×2): qty 2

## 2024-06-17 MED ORDER — FUROSEMIDE 10 MG/ML IJ SOLN
80.0000 mg | Freq: Two times a day (BID) | INTRAMUSCULAR | Status: DC
  Administered 2024-06-17 – 2024-06-20 (×7): 80 mg via INTRAVENOUS
  Filled 2024-06-17 (×7): qty 8

## 2024-06-17 MED ORDER — ACETAMINOPHEN 650 MG RE SUPP: 650.0000 mg | Freq: Four times a day (QID) | RECTAL | Status: DC | PRN

## 2024-06-17 MED ORDER — INSULIN ASPART 100 UNIT/ML IJ SOLN
0.0000 [IU] | Freq: Three times a day (TID) | INTRAMUSCULAR | Status: DC
  Administered 2024-06-20: 3 [IU] via SUBCUTANEOUS
  Filled 2024-06-17: qty 0.15

## 2024-06-17 MED ORDER — FERROUS SULFATE 325 (65 FE) MG PO TABS
325.0000 mg | ORAL_TABLET | Freq: Every day | ORAL | Status: DC
  Administered 2024-06-17 – 2024-06-20 (×4): 325 mg via ORAL
  Filled 2024-06-17 (×4): qty 1

## 2024-06-17 MED ORDER — GLIPIZIDE 5 MG PO TABS
5.0000 mg | ORAL_TABLET | Freq: Every day | ORAL | Status: DC
  Administered 2024-06-17 – 2024-06-20 (×4): 5 mg via ORAL
  Filled 2024-06-17 (×8): qty 1

## 2024-06-17 MED ORDER — POTASSIUM CHLORIDE 10 MEQ/100ML IV SOLN
10.0000 meq | INTRAVENOUS | Status: AC
  Administered 2024-06-17 (×2): 10 meq via INTRAVENOUS
  Filled 2024-06-17 (×4): qty 100

## 2024-06-17 MED ORDER — ACETAMINOPHEN 325 MG PO TABS
650.0000 mg | ORAL_TABLET | Freq: Four times a day (QID) | ORAL | Status: DC | PRN
  Administered 2024-06-17 – 2024-06-19 (×5): 650 mg via ORAL
  Filled 2024-06-17 (×5): qty 2

## 2024-06-17 MED ORDER — METOPROLOL SUCCINATE ER 25 MG PO TB24
25.0000 mg | ORAL_TABLET | Freq: Every day | ORAL | Status: DC
  Administered 2024-06-17 – 2024-06-20 (×4): 25 mg via ORAL
  Filled 2024-06-17 (×4): qty 1

## 2024-06-17 NOTE — ED Notes (Signed)
 ED TO INPATIENT HANDOFF REPORT  Name/Age/Gender Kathryn Mahoney 63 y.o. female  Code Status    Code Status Orders  (From admission, onward)           Start     Ordered   06/17/24 0032  Full code  Continuous       Question:  By:  Answer:  Consent: discussion documented in EHR   06/17/24 0031           Code Status History     Date Active Date Inactive Code Status Order ID Comments User Context   02/24/2024 0826 02/27/2024 1724 Full Code [id number]  Celinda Alm Lot, MD ED   10/23/2021 1704 10/24/2021 1631 Full Code [id number]  Rockey Denece LABOR, DO Inpatient   07/22/2021 0749 07/25/2021 1922 Full Code [id number]  Chotiner, Adine RAMAN, MD Inpatient   09/03/2020 1644 09/06/2020 1846 Full Code [id number]  Rockey Denece LABOR, DO Inpatient   11/02/2013 1358 11/07/2013 1427 Full Code [id number]  Alm Maxwell LABOR, MD Inpatient       Home/SNF/Other Home  Chief Complaint Acute on chronic diastolic (congestive) heart failure (HCC) [I50.33]  Level of Care/Admitting Diagnosis ED Disposition     ED Disposition  Admit   Condition  --   Comment  Hospital Area: [facility] [[id number]]  Level of Care: Telemetry [5]  Admit to tele based on following criteria: Acute CHF  May admit patient to [facility] or [facility] if equivalent level of care is available:: No  Covid Evaluation: Asymptomatic - no recent exposure (last 10 days) testing not required  Diagnosis: Acute on chronic diastolic (congestive) heart failure [facility]) [[id number]]  Admitting Physician: LOU CLARETTA HERO [[id number]]  Attending Physician: LOU CLARETTA HERO [[id number]]  Certification:: I certify this patient will need inpatient services for at least 2 midnights  Expected Medical Readiness: 06/18/2024          Medical History Past Medical History:  Diagnosis Date   Asthma    B12 deficiency    CHF (congestive heart failure) (HCC)    COPD (chronic obstructive pulmonary disease) (HCC)    Diabetes mellitus  without complication (HCC)    Hypertension    Morbid obesity (HCC)    SOB (shortness of breath)    Urinary incontinence     Allergies Allergies  Allergen Reactions   Contrast Media [Iodinated Contrast Media] Other (See Comments)    Coughing, sick to stomach, increase blood pressure    Empagliflozin Other (See Comments)    Yeast infection (reaction to Jardiance)   Losartan  Other (See Comments)    Back pain   Torsemide  Itching    Pt takes this medication because nothing else works ( takes cortisone cream for the itching)   Lisinopril  Nausea And Vomiting   Metformin  Palpitations   Ondansetron Swelling    IV Location/Drains/Wounds Patient Lines/Drains/Airways Status     Active Line/Drains/Airways     Name Placement date Placement time Site Days   Peripheral IV 06/16/24 20 G Left Antecubital 06/16/24  1431  Antecubital  1            Labs/Imaging Results for orders placed or performed during the hospital encounter of 06/16/24 (from the past 48 hours)  Basic metabolic panel     Status: Abnormal   Collection Time: 06/16/24  2:31 PM  Result Value Ref Range   Sodium 139 135 - 145 mmol/L   Potassium 3.3 (L) 3.5 - 5.1 mmol/L   Chloride 97 (L) 98 - 111  mmol/L   CO2 33 (H) 22 - 32 mmol/L   Glucose, Bld 118 (H) 70 - 99 mg/dL    Comment: Glucose reference range applies only to samples taken after fasting for at least 8 hours.   BUN 14 8 - 23 mg/dL   Creatinine, Ser 8.99 0.44 - 1.00 mg/dL   Calcium 9.1 8.9 - 89.6 mg/dL   GFR, Estimated >39 >39 mL/min    Comment: (NOTE) Calculated using the CKD-EPI Creatinine Equation (2021)    Anion gap 9 5 - 15    Comment: Performed at [facility], [address], [place], [place] [address]  CBC     Status: Abnormal   Collection Time: 06/16/24  2:31 PM  Result Value Ref Range   WBC 11.5 (H) 4.0 - 10.5 K/uL   RBC 4.47 3.87 - 5.11 MIL/uL   Hemoglobin 11.3 (L) 12.0 - 15.0 g/dL   HCT 60.8 63.9 - 53.9 %   MCV 87.5 80.0 -  100.0 fL   MCH 25.3 (L) 26.0 - 34.0 pg   MCHC 28.9 (L) 30.0 - 36.0 g/dL   RDW 84.0 (H) 88.4 - 84.4 %   Platelets 561 (H) 150 - 400 K/uL   nRBC 0.0 0.0 - 0.2 %    Comment: Performed at [facility], [address] [place], [place] [address]  Troponin I (High Sensitivity)     Status: None   Collection Time: 06/16/24  2:31 PM  Result Value Ref Range   Troponin I (High Sensitivity) 7 <18 ng/L    Comment: (NOTE) Elevated high sensitivity troponin I (hsTnI) values and significant  changes across serial measurements may suggest ACS but many other  chronic and acute conditions are known to elevate hsTnI results.  Refer to the Links section for chest pain algorithms and additional  guidance. Performed at [facility], [address], [place], [place] [address]   Hepatic function panel     Status: Abnormal   Collection Time: 06/16/24  2:31 PM  Result Value Ref Range   Total Protein 7.8 6.5 - 8.1 g/dL   Albumin 3.0 (L) 3.5 - 5.0 g/dL   AST 16 15 - 41 U/L   ALT 17 0 - 44 U/L   Alkaline Phosphatase 82 38 - 126 U/L   Total Bilirubin 0.5 0.0 - 1.2 mg/dL   Bilirubin, Direct <9.8 0.0 - 0.2 mg/dL   Indirect Bilirubin NOT CALCULATED 0.3 - 0.9 mg/dL    Comment: Performed at [facility], [address], [place], [place] [address]  Brain natriuretic peptide     Status: None   Collection Time: 06/16/24  2:41 PM  Result Value Ref Range   B Natriuretic Peptide 64.2 0.0 - 100.0 pg/mL    Comment: Performed at [facility], [address] [place], [place] [address]  Resp panel by RT-PCR (RSV, Flu A&B, Covid) Anterior Nasal Swab     Status: None   Collection Time: 06/16/24  3:57 PM   Specimen: Anterior Nasal Swab  Result Value Ref Range   SARS Coronavirus 2 by RT PCR NEGATIVE NEGATIVE    Comment: (NOTE) SARS-CoV-2 target nucleic acids are NOT DETECTED.  The SARS-CoV-2 RNA is generally detectable in upper  respiratory specimens during the acute phase of infection. The lowest concentration of SARS-CoV-2 viral copies this assay can detect is 138 copies/mL. A negative result does not preclude SARS-Cov-2 infection and should not be used as the sole basis  for treatment or other patient management decisions. A negative result may occur with  improper specimen collection/handling, submission of specimen other than nasopharyngeal swab, presence of viral mutation(s) within the areas targeted by this assay, and inadequate number of viral copies(<138 copies/mL). A negative result must be combined with clinical observations, patient history, and epidemiological information. The expected result is Negative.  Fact Sheet for Patients:  BloggerCourse.com  Fact Sheet for Healthcare Providers:  SeriousBroker.it  This test is no t yet approved or cleared by the [place]  FDA and  has been authorized for detection and/or diagnosis of SARS-CoV-2 by FDA under an Emergency Use Authorization (EUA). This EUA will remain  in effect (meaning this test can be used) for the duration of the COVID-19 declaration under Section 564(b)(1) of the Act, 21 U.S.C.section 360bbb-3(b)(1), unless the authorization is terminated  or revoked sooner.       Influenza A by PCR NEGATIVE NEGATIVE   Influenza B by PCR NEGATIVE NEGATIVE    Comment: (NOTE) The Xpert Xpress SARS-CoV-2/FLU/RSV plus assay is intended as an aid in the diagnosis of influenza from Nasopharyngeal swab specimens and should not be used as a sole basis for treatment. Nasal washings and aspirates are unacceptable for Xpert Xpress SARS-CoV-2/FLU/RSV testing.  Fact Sheet for Patients: BloggerCourse.com  Fact Sheet for Healthcare Providers: SeriousBroker.it  This test is not yet approved or cleared by the [place]  FDA and has been authorized for  detection and/or diagnosis of SARS-CoV-2 by FDA under an Emergency Use Authorization (EUA). This EUA will remain in effect (meaning this test can be used) for the duration of the COVID-19 declaration under Section 564(b)(1) of the Act, 21 U.S.C. section 360bbb-3(b)(1), unless the authorization is terminated or revoked.     Resp Syncytial Virus by PCR NEGATIVE NEGATIVE    Comment: (NOTE) Fact Sheet for Patients: BloggerCourse.com  Fact Sheet for Healthcare Providers: SeriousBroker.it  This test is not yet approved or cleared by the [place]  FDA and has been authorized for detection and/or diagnosis of SARS-CoV-2 by FDA under an Emergency Use Authorization (EUA). This EUA will remain in effect (meaning this test can be used) for the duration of the COVID-19 declaration under Section 564(b)(1) of the Act, 21 U.S.C. section 360bbb-3(b)(1), unless the authorization is terminated or revoked.  Performed at [facility], [address], [place], [place] [address]   Troponin I (High Sensitivity)     Status: None   Collection Time: 06/16/24  6:01 PM  Result Value Ref Range   Troponin I (High Sensitivity) 6 <18 ng/L    Comment: (NOTE) Elevated high sensitivity troponin I (hsTnI) values and significant  changes across serial measurements may suggest ACS but many other  chronic and acute conditions are known to elevate hsTnI results.  Refer to the Links section for chest pain algorithms and additional  guidance. Performed at [facility], [address], [place], [place] [address]   Magnesium     Status: Abnormal   Collection Time: 06/16/24  6:01 PM  Result Value Ref Range   Magnesium 2.6 (H) 1.7 - 2.4 mg/dL    Comment: Performed at [facility], [address] [place], [place] [address]  Urinalysis, Routine w reflex microscopic -Urine, Clean Catch     Status: Abnormal    Collection Time: 06/16/24  7:03 PM  Result Value Ref Range   Color, Urine YELLOW YELLOW   APPearance CLEAR CLEAR   Specific Gravity, Urine 1.025 1.005 -  1.030   pH 5.0 5.0 - 8.0   Glucose, UA >=500 (A) NEGATIVE mg/dL   Hgb urine dipstick NEGATIVE NEGATIVE   Bilirubin Urine NEGATIVE NEGATIVE   Ketones, ur NEGATIVE NEGATIVE mg/dL   Protein, ur NEGATIVE NEGATIVE mg/dL   Nitrite NEGATIVE NEGATIVE   Leukocytes,Ua NEGATIVE NEGATIVE   RBC / HPF 0-5 0 - 5 RBC/hpf   WBC, UA 0-5 0 - 5 WBC/hpf   Bacteria, UA RARE (A) NONE SEEN   Squamous Epithelial / HPF 0-5 0 - 5 /HPF   Mucus PRESENT     Comment: Performed at [facility], [address], [place], [place] [address]  Basic metabolic panel     Status: Abnormal   Collection Time: 06/17/24  4:22 AM  Result Value Ref Range   Sodium 136 135 - 145 mmol/L   Potassium 2.9 (L) 3.5 - 5.1 mmol/L   Chloride 96 (L) 98 - 111 mmol/L   CO2 31 22 - 32 mmol/L   Glucose, Bld 177 (H) 70 - 99 mg/dL    Comment: Glucose reference range applies only to samples taken after fasting for at least 8 hours.   BUN 14 8 - 23 mg/dL   Creatinine, Ser 8.92 (H) 0.44 - 1.00 mg/dL   Calcium 8.6 (L) 8.9 - 10.3 mg/dL   GFR, Estimated 58 (L) >60 mL/min    Comment: (NOTE) Calculated using the CKD-EPI Creatinine Equation (2021)    Anion gap 9 5 - 15    Comment: Performed at [facility], [address] [place], [place] [address]  CBC     Status: Abnormal   Collection Time: 06/17/24  4:22 AM  Result Value Ref Range   WBC 10.8 (H) 4.0 - 10.5 K/uL   RBC 3.99 3.87 - 5.11 MIL/uL   Hemoglobin 10.3 (L) 12.0 - 15.0 g/dL   HCT 64.9 (L) 63.9 - 53.9 %   MCV 87.7 80.0 - 100.0 fL   MCH 25.8 (L) 26.0 - 34.0 pg   MCHC 29.4 (L) 30.0 - 36.0 g/dL   RDW 84.2 (H) 88.4 - 84.4 %   Platelets 481 (H) 150 - 400 K/uL   nRBC 0.0 0.0 - 0.2 %    Comment: Performed at [facility], [address] [place], [place] [address]  Hemoglobin A1c      Status: Abnormal   Collection Time: 06/17/24  4:22 AM  Result Value Ref Range   Hgb A1c MFr Bld 6.2 (H) 4.8 - 5.6 %    Comment: (NOTE) Diagnosis of Diabetes The following HbA1c ranges recommended by the American Diabetes Association (ADA) may be used as an aid in the diagnosis of diabetes mellitus.  Hemoglobin             Suggested A1C NGSP%              Diagnosis  <5.7                   Non Diabetic  5.7-6.4                Pre-Diabetic  >6.4                   Diabetic  <7.0                   Glycemic control for  adults with diabetes.     Mean Plasma Glucose 131.24 mg/dL    Comment: Performed at [facility], [address] [place], [place] [address]  Magnesium     Status: None   Collection Time: 06/17/24  4:22 AM  Result Value Ref Range   Magnesium 2.3 1.7 - 2.4 mg/dL    Comment: Performed at [facility], [address], [place], [place] [address]  Phosphorus     Status: None   Collection Time: 06/17/24  4:22 AM  Result Value Ref Range   Phosphorus 3.1 2.5 - 4.6 mg/dL    Comment: Performed at [facility], [address], [place], [place] [address]  CBG monitoring, ED     Status: None   Collection Time: 06/17/24  7:25 AM  Result Value Ref Range   Glucose-Capillary 89 70 - 99 mg/dL    Comment: Glucose reference range applies only to samples taken after fasting for at least 8 hours.  CBG monitoring, ED     Status: Abnormal   Collection Time: 06/17/24 12:00 PM  Result Value Ref Range   Glucose-Capillary 101 (H) 70 - 99 mg/dL    Comment: Glucose reference range applies only to samples taken after fasting for at least 8 hours.  CBG monitoring, ED     Status: None   Collection Time: 06/17/24  5:07 PM  Result Value Ref Range   Glucose-Capillary 85 70 - 99 mg/dL    Comment: Glucose reference range applies only to samples taken after fasting for at least 8 hours.   DG Chest 2 View Result Date:  06/16/2024 CLINICAL DATA:  chest pain EXAM: CHEST - 2 VIEW COMPARISON:  February 23, 2024 FINDINGS: Fine reticular opacities throughout both lungs. No focal airspace consolidation, pleural effusion, or pneumothorax. Mild cardiomegaly.No acute fracture or destructive lesion. Multilevel thoracic osteophytosis. IMPRESSION: Fine reticular opacities throughout both lungs, worrisome for a atypical infection or viral pneumonia. Alternatively, interstitial edema could also have this appearance in the correct clinical context. Electronically Signed   By: Rogelia Myers M.D.   On: 06/16/2024 15:53    Pending Labs Unresulted Labs (From admission, onward)    None       Vitals/Pain Today's Vitals   06/17/24 1353 06/17/24 1515 06/17/24 1711 06/17/24 1815  BP:  119/87  125/77  Pulse:  79  89  Resp:  (!) 28  18  Temp:   98.1 F (36.7 C)   TempSrc:   Oral   SpO2:  96%  95%  Weight:      Height:      PainSc: 3        Isolation Precautions No active isolations  Medications Medications  acetaminophen  (TYLENOL ) tablet 650 mg (650 mg Oral Given 06/17/24 1353)    Or  acetaminophen  (TYLENOL ) suppository 650 mg ( Rectal See Alternative 06/17/24 1353)  senna-docusate (Senokot-S) tablet 1 tablet (has no administration in time range)  bisacodyl  (DULCOLAX) EC tablet 5 mg (has no administration in time range)  enoxaparin  (LOVENOX ) injection 40 mg (40 mg Subcutaneous Given 06/17/24 0957)  insulin  aspart (novoLOG ) injection 0-15 Units ( Subcutaneous Not Given 06/17/24 1716)  amLODipine  (NORVASC ) tablet 5 mg (5 mg Oral Given 06/17/24 0957)  metoprolol  succinate (TOPROL -XL) 24 hr tablet 25 mg (25 mg Oral Given 06/17/24 0957)  glipiZIDE  (GLUCOTROL ) tablet 5 mg (5 mg Oral Given 06/17/24 1006)  ferrous sulfate  tablet 325 mg (325 mg Oral Given 06/17/24 0957)  fluticasone  furoate-vilanterol (BREO ELLIPTA )  200-25 MCG/ACT 1 puff (1 puff Inhalation Given 06/17/24 1009)  fluticasone  (FLONASE ) 50 MCG/ACT nasal spray 2 spray  (has no administration in time range)  ipratropium-albuterol  (DUONEB) 0.5-2.5 (3) MG/3ML nebulizer solution 3 mL (3 mLs Nebulization Given 06/17/24 0433)  potassium chloride  SA (KLOR-CON  M) CR tablet 20 mEq (20 mEq Oral Given 06/17/24 0957)  furosemide  (LASIX ) injection 80 mg (80 mg Intravenous Given 06/17/24 1844)  potassium chloride  SA (KLOR-CON  M) CR tablet 40 mEq (40 mEq Oral Given 06/17/24 0956)  potassium chloride  10 mEq in 100 mL IVPB (0 mEq Intravenous Stopped 06/17/24 1657)  acetaminophen  (TYLENOL ) tablet 650 mg (650 mg Oral Given 06/16/24 1547)  potassium chloride  SA (KLOR-CON  M) CR tablet 40 mEq (40 mEq Oral Given 06/16/24 1947)  furosemide  (LASIX ) injection 40 mg (40 mg Intravenous Given 06/16/24 1953)  ipratropium-albuterol  (DUONEB) 0.5-2.5 (3) MG/3ML nebulizer solution 3 mL (3 mLs Nebulization Given 06/16/24 1946)  azithromycin  (ZITHROMAX ) 500 mg in sodium chloride  0.9 % 250 mL IVPB (0 mg Intravenous Stopped 06/16/24 2230)  furosemide  (LASIX ) injection 40 mg (40 mg Intravenous Given 06/16/24 2228)  potassium chloride  10 mEq in 100 mL IVPB (0 mEq Intravenous Stopped 06/17/24 1657)    Mobility walks with person assist

## 2024-06-17 NOTE — ED Notes (Signed)
 Pt provided with bedside commode d/t IV diuresis.

## 2024-06-17 NOTE — Progress Notes (Signed)
 PROGRESS NOTE    Kathryn Mahoney  FMW:[id number] DOB: [birth year] DOA: 06/16/2024 PCP: [facility]   Brief Narrative:   Kathryn Mahoney is a 63 y.o. female with medical history significant for chronic diastolic heart failure, asthma, B12 deficiency, class III obesity, COPD, type 2 diabetes, hypertension, frequent dyspnea, urinary incontinence and chronic hypoxemia on intermittent home oxygen who presents to the ED for evaluation of shortness of breath, chest pain and abdominal distention.  Patient reports that over the last few days, she has had increased weight gain and abdominal distention.  She has had dietary indiscretion and came to the ED for further evaluation and is now being diuresed.  Assessment and Plan:  Acute on Chronic Diastolic Heart Failure:  Last TTE on 02/24/2024 shows EF 55 to 60%, G1DD, mild to moderately dilated LA; - Pt presented with SOB, abdominal distention, cough and chest discomfort. - BNP within normal limits (underestimated due to obesity), CXR with interstitial edema. Pt with clinical signs of CHF exacerbation - Acute CHF likely 2/2 under-diuresing and likely dietary indiscretion.  At discharge from her last hospitalization, patient was advised to take torsemide  40 mg twice a day however she reports taking only 2 of the torsemide  20 mg pills in the morning. C/w IV lasix  80 mg twice daily. Continue Toprol  XL. Strict I&O, daily weights.  Maintain K+ > 4.0, Mag > 2.0. CTM on Telemetry   T2DM: Last A1c 6.9% 3 months ago. Repeat HbA1c this visit is 6.2. Continue Glipizide . SSI with meals, CBG monitoring and current trend ranging from 85-101   Essential HTN:  BP stable with SBP in the 110s to 130s. Reports she discontinued Entresto  and spironolactone  due to low BP and dizziness, her PCP currently monitoring her on amlodipine  and Toprol  XL only. Continue amlodipine  and Toprol  XL. CTM BP per Protocol. Last BP reading was 125/77  Thrombocytosis: Likely Reactive. Plt  Count went from 561 -> 481. CTM and Trend and repeat CBC in the AM  Leukocytosis: WBC went from 11.5 -> 10.8. Likely Reactive. CTM and Trend and repeat CBC in the AM   COPD: Stable and chronic, not in exacerbation.  Continue home bronchodilator.  As needed DuoNebs. Repeat CXR in the AM    Hypokalemia: K+ went from 3.3 -> 2.9:  Recent Labs  Lab 06/16/24 1431 06/17/24 0422  K 3.3* 2.9*  -C/w Potassium supplementation with IV diuresing. CTM and Replete as Necessary and repeat CMP in the AM  Hypoalbuminemia: Patient's Albumin Lvl was 3.0. CTM and Trend and repeat CMP in the AM  OSA: CPAP at bedtime   Class III (Morbid) Obesity: Complicates overall prognosis and care. Estimated body mass index is 42.83 kg/m as calculated from the following:   Height as of this encounter: 5' 9 (1.753 m).   Weight as of this encounter: 131.5 kg. Weight Loss and Dietary Counseling given   DVT prophylaxis: enoxaparin  (LOVENOX ) injection 40 mg Start: 06/17/24 1000    Code Status: Full Code Family Communication: No family present @ bedside   Disposition Plan:  Level of care: Telemetry Status is: Inpatient Remains inpatient appropriate because: Needs further clinical improvement and diuresis back to dry weight   Consultants:  None  Procedures:  As delineated above  Antimicrobials:  Anti-infectives (From admission, onward)    Start     Dose/Rate Route Frequency Ordered Stop   06/16/24 1945  azithromycin  (ZITHROMAX ) 500 mg in sodium chloride  0.9 % 250 mL IVPB        500  mg 250 mL/hr over 60 Minutes Intravenous  Once 06/16/24 1933 06/16/24 2230       Subjective: Seen and examined at bedside in post feeling a little bit better compared to yesterday.  Still feeling short of breath.  States that she is urinating a lot.  No nausea or vomiting.  No other concerns or complaints at this time.  Objective: Vitals:   06/17/24 1152 06/17/24 1515 06/17/24 1711 06/17/24 1815  BP: 107/84 119/87  125/77   Pulse: 93 79  89  Resp: 18 (!) 28  18  Temp: 98.1 F (36.7 C)  98.1 F (36.7 C)   TempSrc: Oral  Oral   SpO2: 96% 96%  95%  Weight:      Height:        Intake/Output Summary (Last 24 hours) at 06/17/2024 2016 Last data filed at 06/17/2024 8151 Gross per 24 hour  Intake --  Output 1200 ml  Net -1200 ml   Filed Weights   06/16/24 1427  Weight: 131.5 kg   Examination: Physical Exam:  Constitutional: WN/WD morbidly obese African-American female in no acute distress appears calm Respiratory: Diminished to auscultation bilaterally with coarse breath sounds and does have some slight crackles and rhonchi but no appreciable wheezing or rales.  Has a normal respiratory effort and is not using any accessory muscles to breathe Cardiovascular: RRR, no murmurs / rubs / gallops. S1 and S2 auscultated.  1+ extremity edema. Abdomen: Soft, non-tender, distended secondary to body habitus. Bowel sounds positive.  GU: Deferred. Musculoskeletal: No clubbing / cyanosis of digits/nails. No joint deformity upper and lower extremities.  Skin: No rashes, lesions, ulcers on limited skin evaluation. No induration; Warm and dry.  Neurologic: CN 2-12 grossly intact with no focal deficits. Romberg sign and cerebellar reflexes not assessed.  Psychiatric: Normal judgment and insight. Alert and oriented x 3. Normal mood and appropriate affect.   Data Reviewed: I have personally reviewed following labs and imaging studies  CBC: Recent Labs  Lab 06/16/24 1431 06/17/24 0422  WBC 11.5* 10.8*  HGB 11.3* 10.3*  HCT 39.1 35.0*  MCV 87.5 87.7  PLT 561* 481*   Basic Metabolic Panel: Recent Labs  Lab 06/16/24 1431 06/16/24 1801 06/17/24 0422  NA 139  --  136  K 3.3*  --  2.9*  CL 97*  --  96*  CO2 33*  --  31  GLUCOSE 118*  --  177*  BUN 14  --  14  CREATININE 1.00  --  1.07*  CALCIUM 9.1  --  8.6*  MG  --  2.6* 2.3  PHOS  --   --  3.1   GFR: Estimated Creatinine Clearance: 76.4 mL/min (A) (by  C-G formula based on SCr of 1.07 mg/dL (H)). Liver Function Tests: Recent Labs  Lab 06/16/24 1431  AST 16  ALT 17  ALKPHOS 82  BILITOT 0.5  PROT 7.8  ALBUMIN 3.0*   No results for input(s): LIPASE, AMYLASE in the last 168 hours. No results for input(s): AMMONIA in the last 168 hours. Coagulation Profile: No results for input(s): INR, PROTIME in the last 168 hours. Cardiac Enzymes: No results for input(s): CKTOTAL, CKMB, CKMBINDEX, TROPONINI in the last 168 hours. BNP (last 3 results) No results for input(s): PROBNP in the last 8760 hours. HbA1C: Recent Labs    06/17/24 0422  HGBA1C 6.2*   CBG: Recent Labs  Lab 06/17/24 0725 06/17/24 1200 06/17/24 1707  GLUCAP 89 101* 85   Lipid Profile: No  results for input(s): CHOL, HDL, LDLCALC, TRIG, CHOLHDL, LDLDIRECT in the last 72 hours. Thyroid Function Tests: No results for input(s): TSH, T4TOTAL, FREET4, T3FREE, THYROIDAB in the last 72 hours. Anemia Panel: No results for input(s): VITAMINB12, FOLATE, FERRITIN, TIBC, IRON, RETICCTPCT in the last 72 hours. Sepsis Labs: No results for input(s): PROCALCITON, LATICACIDVEN in the last 168 hours.  Recent Results (from the past 240 hours)  Resp panel by RT-PCR (RSV, Flu A&B, Covid) Anterior Nasal Swab     Status: None   Collection Time: 06/16/24  3:57 PM   Specimen: Anterior Nasal Swab  Result Value Ref Range Status   SARS Coronavirus 2 by RT PCR NEGATIVE NEGATIVE Final    Comment: (NOTE) SARS-CoV-2 target nucleic acids are NOT DETECTED.  The SARS-CoV-2 RNA is generally detectable in upper respiratory specimens during the acute phase of infection. The lowest concentration of SARS-CoV-2 viral copies this assay can detect is 138 copies/mL. A negative result does not preclude SARS-Cov-2 infection and should not be used as the sole basis for treatment or other patient management decisions. A negative result may occur with   improper specimen collection/handling, submission of specimen other than nasopharyngeal swab, presence of viral mutation(s) within the areas targeted by this assay, and inadequate number of viral copies(<138 copies/mL). A negative result must be combined with clinical observations, patient history, and epidemiological information. The expected result is Negative.  Fact Sheet for Patients:  BloggerCourse.com  Fact Sheet for Healthcare Providers:  SeriousBroker.it  This test is no t yet approved or cleared by the [place]  FDA and  has been authorized for detection and/or diagnosis of SARS-CoV-2 by FDA under an Emergency Use Authorization (EUA). This EUA will remain  in effect (meaning this test can be used) for the duration of the COVID-19 declaration under Section 564(b)(1) of the Act, 21 U.S.C.section 360bbb-3(b)(1), unless the authorization is terminated  or revoked sooner.       Influenza A by PCR NEGATIVE NEGATIVE Final   Influenza B by PCR NEGATIVE NEGATIVE Final    Comment: (NOTE) The Xpert Xpress SARS-CoV-2/FLU/RSV plus assay is intended as an aid in the diagnosis of influenza from Nasopharyngeal swab specimens and should not be used as a sole basis for treatment. Nasal washings and aspirates are unacceptable for Xpert Xpress SARS-CoV-2/FLU/RSV testing.  Fact Sheet for Patients: BloggerCourse.com  Fact Sheet for Healthcare Providers: SeriousBroker.it  This test is not yet approved or cleared by the [place]  FDA and has been authorized for detection and/or diagnosis of SARS-CoV-2 by FDA under an Emergency Use Authorization (EUA). This EUA will remain in effect (meaning this test can be used) for the duration of the COVID-19 declaration under Section 564(b)(1) of the Act, 21 U.S.C. section 360bbb-3(b)(1), unless the authorization is terminated or revoked.      Resp Syncytial Virus by PCR NEGATIVE NEGATIVE Final    Comment: (NOTE) Fact Sheet for Patients: BloggerCourse.com  Fact Sheet for Healthcare Providers: SeriousBroker.it  This test is not yet approved or cleared by the [place]  FDA and has been authorized for detection and/or diagnosis of SARS-CoV-2 by FDA under an Emergency Use Authorization (EUA). This EUA will remain in effect (meaning this test can be used) for the duration of the COVID-19 declaration under Section 564(b)(1) of the Act, 21 U.S.C. section 360bbb-3(b)(1), unless the authorization is terminated or revoked.  Performed at [facility], [address], [place], [place] [address]     Radiology Studies: DG Chest 2 View Result  Date: 06/16/2024 CLINICAL DATA:  chest pain EXAM: CHEST - 2 VIEW COMPARISON:  February 23, 2024 FINDINGS: Fine reticular opacities throughout both lungs. No focal airspace consolidation, pleural effusion, or pneumothorax. Mild cardiomegaly.No acute fracture or destructive lesion. Multilevel thoracic osteophytosis. IMPRESSION: Fine reticular opacities throughout both lungs, worrisome for a atypical infection or viral pneumonia. Alternatively, interstitial edema could also have this appearance in the correct clinical context. Electronically Signed   By: Rogelia Myers M.D.   On: 06/16/2024 15:53   Scheduled Meds:  amLODipine   5 mg Oral Daily   enoxaparin  (LOVENOX ) injection  40 mg Subcutaneous Q24H   ferrous sulfate   325 mg Oral Q breakfast   fluticasone  furoate-vilanterol  1 puff Inhalation Daily   furosemide   80 mg Intravenous BID   glipiZIDE   5 mg Oral Daily   insulin  aspart  0-15 Units Subcutaneous TID WC   metoprolol  succinate  25 mg Oral Daily   potassium chloride   20 mEq Oral Daily   potassium chloride   40 mEq Oral BID   Continuous Infusions:   LOS: 1 day   Alejandro Marker, DO [facility] Available via Epic  secure chat 7am-7pm After these hours, please refer to coverage provider listed on [email or web address] 06/17/2024, 8:16 PM

## 2024-06-17 NOTE — Progress Notes (Signed)
   06/17/24 2258  BiPAP/CPAP/SIPAP  $ Non-Invasive Home Ventilator  Initial  $ Face Mask Large  Yes  BiPAP/CPAP/SIPAP Pt Type Adult  BiPAP/CPAP/SIPAP Resmed  Mask Type Full face mask  Dentures removed? Not applicable  Mask Size Large  Respiratory Rate 18 breaths/min  EPAP 14 cmH2O  FiO2 (%) 21 %  Patient Home Machine No  Patient Home Mask No  Patient Home Tubing No  Nasal massage performed Yes  CPAP/SIPAP surface wiped down Yes  Device Plugged into RED Power Outlet Yes  BiPAP/CPAP /SiPAP Vitals  Pulse Rate 97  Resp 18  SpO2 100 %

## 2024-06-17 NOTE — Hospital Course (Addendum)
 Kathryn Mahoney is a 63 y.o. female with medical history significant for chronic diastolic heart failure, asthma, B12 deficiency, class III obesity, COPD, type 2 diabetes, hypertension, frequent dyspnea, urinary incontinence and chronic hypoxemia on intermittent home oxygen who presents to the ED for evaluation of shortness of breath, chest pain and abdominal distention.  Patient reports that over the last few days, she has had increased weight gain and abdominal distention.  She has had dietary indiscretion and came to the ED for further evaluation and is now being diuresed with high dose IV Lasix  and slowly improving.  Assessment and Plan:  Acute on Chronic Diastolic Heart Failure:  Last TTE on 02/24/2024 shows EF 55 to 60%, G1DD, mild to moderately dilated LA;  Pt presented with SOB, abdominal distention, cough and chest discomfort. - BNP within normal limits at 64.2 (underestimated due to obesity), initial CXR with interstitial edema. Pt with clinical signs of CHF exacerbation -Acute CHF likely 2/2 under-diuresing and likely dietary indiscretion.  At discharge from her last hospitalization, patient was advised to take torsemide  40 mg twice a day however she reports taking only 2 of the torsemide  20 mg pills in the morning.  -C/w Metoprolol  Succinate 25 mg po Daily  -C/w IV Lasix  80 mg twice daily. Continue Toprol  XL. Strict I&O, daily weights.  Intake/Output Summary (Last 24 hours) at 06/18/2024 1506 Last data filed at 06/18/2024 0900 Gross per 24 hour  Intake 120 ml  Output 1200 ml  Net -1080 ml  -Maintain K+ > 4.0, Mag > 2.0. CTM on Telemetry -Repeat CXR 8/23 showed mild central vascular congestion w/o edema  T2DM: Last A1c 6.9% 3 months ago. Repeat HbA1c this visit is 6.2. Continue Glipizide  5 mg po Daily and Moderate Novolog  SSI AC; C/w CBG monitoring and current trend ranging from 85-108   Essential HTN: BP stable with SBP in the 110s to 130s. Reports she discontinued Entresto  and  spironolactone  due to low BP and dizziness, her PCP currently monitoring her on amlodipine  and Toprol  XL only. Continue amlodipine  and Toprol  XL. CTM BP per Protocol. Last BP reading was 109/73  Normocytic Anemia: Hgb/Hct went from 11.3/39.1 -> 10.3/35.0 -> 10.8/36.2 w/ MCV of 87.2. C/w Ferrous Sulfate  325 mg po Daily. Check Anemia Panel in the AM. CTM for S/Sx of Bleeding; No overt bleeding noted. Repeat CBC in the AM  Renal Insufficiency: BUN/Cr went from 14/1.00 -> 14/1.07 -> 17/0.88. Avoid Nephrotoxic Medications, Contrast Dyes, Hypotension and Dehydration to Ensure Adequate Renal Perfusion and will need to Renally Adjust Meds. CTM and Trend Renal Function carefully as patient is being diuresed and repeat CMP in the AM   Thrombocytosis: Likely Reactive. Plt Count went from 561 -> 481 -> 517. CTM and Trend and repeat CBC in the AM  Back Pain: Will continue acetaminophen  and add oxycodone  IR 5 mg every 6 as needed for moderate pain. C/w Lidocaine  5% Patch 1 patch TD   Leukocytosis: WBC went from 11.5 -> 10.8 -> 11.1. Likely Reactive. CTM and Trend and repeat CBC in the AM  Yeast Infection: Ordered for po Fluconazole  150 mg x1.    COPD: Stable and chronic, not in exacerbation. Continue home bronchodilator w/ Breo Ellipta  200-25 mcg/act 1 puff and Duoneb 3 mL q6hprn Wheezing and SOB. Pt also has Guaifenesin -Dextromethorphan  5 mL po q4hprn Cough and Fluticasone  2 spray Each Nare Daily PRN; Repeat CXR in the AM    Hypokalemia: K+ went from 3.3 -> 2.9 -> 3.4. C/w Potassium supplementation with po  KCL 40 mEQ BID x2 + po KCL 20 mEQ daily while diuresing w/ IV Furosemide . CTM and Replete as Necessary and repeat CMP in the AM  Hypoalbuminemia: Patient's Albumin Lvl was 2.9. CTM and Trend and repeat CMP in the AM  OSA: CPAP at bedtime   Class III (Morbid) Obesity: Complicates overall prognosis and care. Estimated body mass index is 42.23 kg/m as calculated from the following:   Height as of this  encounter: 5' 9 (1.753 m).   Weight as of this encounter: 129.7 kg. Weight Loss and Dietary Counseling given

## 2024-06-17 NOTE — Progress Notes (Signed)
 Patient Pella arrived on floor in wheelchair.  Alert and oriented x 4.  Denies fall in the last 6 months.  Oriented to floor and equipment.  Requested bath setup.  Telemetry monitoring applied.  Vitals taken.  Skin assessment completed with Dama, RN.  No questions or concerns at this time. Living Better with Heart Failure Book ordered. Continue to monitor.

## 2024-06-18 ENCOUNTER — Inpatient Hospital Stay (HOSPITAL_COMMUNITY)

## 2024-06-18 DIAGNOSIS — I5033 Acute on chronic diastolic (congestive) heart failure: Secondary | ICD-10-CM | POA: Diagnosis not present

## 2024-06-18 DIAGNOSIS — E66813 Obesity, class 3: Secondary | ICD-10-CM | POA: Diagnosis not present

## 2024-06-18 LAB — CBC WITH DIFFERENTIAL/PLATELET
Abs Immature Granulocytes: 0.05 K/uL (ref 0.00–0.07)
Basophils Absolute: 0 K/uL (ref 0.0–0.1)
Basophils Relative: 0 %
Eosinophils Absolute: 0.4 K/uL (ref 0.0–0.5)
Eosinophils Relative: 4 %
HCT: 36.2 % (ref 36.0–46.0)
Hemoglobin: 10.8 g/dL — ABNORMAL LOW (ref 12.0–15.0)
Immature Granulocytes: 0 %
Lymphocytes Relative: 34 %
Lymphs Abs: 3.8 K/uL (ref 0.7–4.0)
MCH: 26 pg (ref 26.0–34.0)
MCHC: 29.8 g/dL — ABNORMAL LOW (ref 30.0–36.0)
MCV: 87.2 fL (ref 80.0–100.0)
Monocytes Absolute: 0.5 K/uL (ref 0.1–1.0)
Monocytes Relative: 4 %
Neutro Abs: 6.4 K/uL (ref 1.7–7.7)
Neutrophils Relative %: 58 %
Platelets: 517 K/uL — ABNORMAL HIGH (ref 150–400)
RBC: 4.15 MIL/uL (ref 3.87–5.11)
RDW: 15.8 % — ABNORMAL HIGH (ref 11.5–15.5)
WBC: 11.1 K/uL — ABNORMAL HIGH (ref 4.0–10.5)
nRBC: 0 % (ref 0.0–0.2)

## 2024-06-18 LAB — GLUCOSE, CAPILLARY
Glucose-Capillary: 108 mg/dL — ABNORMAL HIGH (ref 70–99)
Glucose-Capillary: 100 mg/dL — ABNORMAL HIGH (ref 70–99)
Glucose-Capillary: 94 mg/dL (ref 70–99)
Glucose-Capillary: 81 mg/dL (ref 70–99)

## 2024-06-18 LAB — COMPREHENSIVE METABOLIC PANEL WITH GFR
ALT: 16 U/L (ref 0–44)
AST: 14 U/L — ABNORMAL LOW (ref 15–41)
Albumin: 2.9 g/dL — ABNORMAL LOW (ref 3.5–5.0)
Alkaline Phosphatase: 81 U/L (ref 38–126)
Anion gap: 9 (ref 5–15)
BUN: 17 mg/dL (ref 8–23)
CO2: 30 mmol/L (ref 22–32)
Calcium: 8.9 mg/dL (ref 8.9–10.3)
Chloride: 99 mmol/L (ref 98–111)
Creatinine, Ser: 0.88 mg/dL (ref 0.44–1.00)
GFR, Estimated: >60 mL/min (ref >60)
Glucose, Bld: 127 mg/dL — ABNORMAL HIGH (ref 70–99)
Potassium: 3.4 mmol/L — ABNORMAL LOW (ref 3.5–5.1)
Sodium: 138 mmol/L (ref 135–145)
Total Bilirubin: 0.6 mg/dL (ref 0.0–1.2)
Total Protein: 7.3 g/dL (ref 6.5–8.1)

## 2024-06-18 LAB — MAGNESIUM: Magnesium: 2.3 mg/dL (ref 1.7–2.4)

## 2024-06-18 LAB — PHOSPHORUS: Phosphorus: 3.1 mg/dL (ref 2.5–4.6)

## 2024-06-18 MED ORDER — POTASSIUM CHLORIDE CRYS ER 20 MEQ PO TBCR
40.0000 meq | EXTENDED_RELEASE_TABLET | Freq: Two times a day (BID) | ORAL | Status: AC
  Administered 2024-06-18 (×2): 40 meq via ORAL
  Filled 2024-06-18 (×2): qty 2

## 2024-06-18 MED ORDER — OXYCODONE HCL 5 MG PO TABS
5.0000 mg | ORAL_TABLET | Freq: Four times a day (QID) | ORAL | Status: DC | PRN
  Administered 2024-06-18 – 2024-06-20 (×4): 5 mg via ORAL
  Filled 2024-06-18 (×4): qty 1

## 2024-06-18 MED ORDER — FLUCONAZOLE 150 MG PO TABS
150.0000 mg | ORAL_TABLET | Freq: Once | ORAL | Status: AC
  Administered 2024-06-18: 150 mg via ORAL
  Filled 2024-06-18 (×2): qty 1

## 2024-06-18 NOTE — Progress Notes (Signed)
   06/18/24 2221  BiPAP/CPAP/SIPAP  Reason BIPAP/CPAP not in use Other(comment) (patient stated she is not ready for her cpap yet. she stated she will probably put it on around midnight and that she knows how to put it on. i told her if she needs any help to just let the nurse know to call me.)

## 2024-06-18 NOTE — Plan of Care (Signed)
  Problem: Fluid Volume: Goal: Ability to maintain a balanced intake and output will improve Outcome: Progressing   Problem: Health Behavior/Discharge Planning: Goal: Ability to identify and utilize available resources and services will improve Outcome: Progressing   Problem: Clinical Measurements: Goal: Ability to maintain clinical measurements within normal limits will improve Outcome: Progressing

## 2024-06-18 NOTE — Progress Notes (Signed)
 PROGRESS NOTE    Kathryn Mahoney  FMW:[id number] DOB: December 28, 1960 DOA: 06/16/2024 PCP: [facility]   Brief Narrative:   Kathryn Mahoney is a 63 y.o. female with medical history significant for chronic diastolic heart failure, asthma, B12 deficiency, class III obesity, COPD, type 2 diabetes, hypertension, frequent dyspnea, urinary incontinence and chronic hypoxemia on intermittent home oxygen who presents to the ED for evaluation of shortness of breath, chest pain and abdominal distention.  Patient reports that over the last few days, she has had increased weight gain and abdominal distention.  She has had dietary indiscretion and came to the ED for further evaluation and is now being diuresed with high dose IV Lasix  and slowly improving.  Assessment and Plan:  Acute on Chronic Diastolic Heart Failure:  Last TTE on 02/24/2024 shows EF 55 to 60%, G1DD, mild to moderately dilated LA;  Pt presented with SOB, abdominal distention, cough and chest discomfort. - BNP within normal limits at 64.2 (underestimated due to obesity), initial CXR with interstitial edema. Pt with clinical signs of CHF exacerbation -Acute CHF likely 2/2 under-diuresing and likely dietary indiscretion.  At discharge from her last hospitalization, patient was advised to take torsemide  40 mg twice a day however she reports taking only 2 of the torsemide  20 mg pills in the morning.  -C/w Metoprolol  Succinate 25 mg po Daily  -C/w IV Lasix  80 mg twice daily. Continue Toprol  XL. Strict I&O, daily weights.  Intake/Output Summary (Last 24 hours) at 06/18/2024 1506 Last data filed at 06/18/2024 0900 Gross per 24 hour  Intake 120 ml  Output 1200 ml  Net -1080 ml  -Maintain K+ > 4.0, Mag > 2.0. CTM on Telemetry -Repeat CXR 8/23 showed mild central vascular congestion w/o edema  T2DM: Last A1c 6.9% 3 months ago. Repeat HbA1c this visit is 6.2. Continue Glipizide  5 mg po Daily and Moderate Novolog  SSI AC; C/w CBG monitoring and  current trend ranging from 85-108   Essential HTN: BP stable with SBP in the 110s to 130s. Reports she discontinued Entresto  and spironolactone  due to low BP and dizziness, her PCP currently monitoring her on amlodipine  and Toprol  XL only. Continue amlodipine  and Toprol  XL. CTM BP per Protocol. Last BP reading was 109/73  Normocytic Anemia: Hgb/Hct went from 11.3/39.1 -> 10.3/35.0 -> 10.8/36.2 w/ MCV of 87.2. C/w Ferrous Sulfate  325 mg po Daily. Check Anemia Panel in the AM. CTM for S/Sx of Bleeding; No overt bleeding noted. Repeat CBC in the AM  Renal Insufficiency: BUN/Cr went from 14/1.00 -> 14/1.07 -> 17/0.88. Avoid Nephrotoxic Medications, Contrast Dyes, Hypotension and Dehydration to Ensure Adequate Renal Perfusion and will need to Renally Adjust Meds. CTM and Trend Renal Function carefully as patient is being diuresed and repeat CMP in the AM   Thrombocytosis: Likely Reactive. Plt Count went from 561 -> 481 -> 517. CTM and Trend and repeat CBC in the AM  Back Pain: Will continue acetaminophen  and add oxycodone  IR 5 mg every 6 as needed for moderate pain. C/w Lidocaine  5% Patch 1 patch TD   Leukocytosis: WBC went from 11.5 -> 10.8 -> 11.1. Likely Reactive. CTM and Trend and repeat CBC in the AM  Yeast Infection: Ordered for po Fluconazole  150 mg x1.    COPD: Stable and chronic, not in exacerbation. Continue home bronchodilator w/ Breo Ellipta  200-25 mcg/act 1 puff and Duoneb 3 mL q6hprn Wheezing and SOB. Pt also has Guaifenesin -Dextromethorphan  5 mL po q4hprn Cough and Fluticasone  2 spray Each Nare Daily  PRN; Repeat CXR in the AM    Hypokalemia: K+ went from 3.3 -> 2.9 -> 3.4. C/w Potassium supplementation with po KCL 40 mEQ BID x2 + po KCL 20 mEQ daily while diuresing w/ IV Furosemide . CTM and Replete as Necessary and repeat CMP in the AM  Hypoalbuminemia: Patient's Albumin Lvl was 2.9. CTM and Trend and repeat CMP in the AM  OSA: CPAP at bedtime   Class III (Morbid) Obesity:  Complicates overall prognosis and care. Estimated body mass index is 42.23 kg/m as calculated from the following:   Height as of this encounter: 5' 9 (1.753 m).   Weight as of this encounter: 129.7 kg. Weight Loss and Dietary Counseling given  DVT prophylaxis: enoxaparin  (LOVENOX ) injection 40 mg Start: 06/17/24 1000    Code Status: Full Code Family Communication: No family present @ bedside  Disposition Plan:  Level of care: Telemetry Status is: Inpatient Remains inpatient appropriate because: Needs further clinical improvement and continue diuresis   Consultants:  None  Procedures:  As delineated as above  Antimicrobials:  Anti-infectives (From admission, onward)    Start     Dose/Rate Route Frequency Ordered Stop   06/18/24 1200  fluconazole  (DIFLUCAN ) tablet 150 mg        150 mg Oral  Once 06/18/24 1100 06/18/24 1321   06/16/24 1945  azithromycin  (ZITHROMAX ) 500 mg in sodium chloride  0.9 % 250 mL IVPB        500 mg 250 mL/hr over 60 Minutes Intravenous  Once 06/16/24 1933 06/16/24 2230       Subjective: Seen and examined at bedside thinks he is slowly improving.  No nausea or vomiting.  Complaining of having a yeast infection now and asking for Diflucan .  No other concerns or complaints at this time but also complain of some back pain.  Objective: Vitals:   06/18/24 0810 06/18/24 0900 06/18/24 1303 06/18/24 1308  BP:  138/77  109/73  Pulse:  93  71  Resp:  18  20  Temp:  98.4 F (36.9 C)  98.3 F (36.8 C)  TempSrc:  Oral  Oral  SpO2: 96% 95% 96% 97%  Weight:      Height:        Intake/Output Summary (Last 24 hours) at 06/18/2024 1512 Last data filed at 06/18/2024 0900 Gross per 24 hour  Intake 120 ml  Output 1200 ml  Net -1080 ml   Filed Weights   06/16/24 1427 06/17/24 2000 06/18/24 0502  Weight: 131.5 kg 133.6 kg 129.7 kg   Examination: Physical Exam:  Constitutional: WN/WD morbidly obese African-American female in no acute distress  Respiratory:  Diminished to auscultation bilaterally, no wheezing, rales, rhonchi or crackles. Normal respiratory effort and patient is not tachypenic. No accessory muscle use.  Cardiovascular: RRR, no murmurs / rubs / gallops. S1 and S2 auscultated.  Has mild 1+ lower extremity edema Abdomen: Soft, non-tender, distended secondary to body habitus. Bowel sounds positive.  GU: Deferred. Musculoskeletal: No clubbing / cyanosis of digits/nails. No joint deformity upper and lower extremities. Skin: No rashes, lesions, ulcers on a limited skin evaluation. No induration; Warm and dry.  Neurologic: CN 2-12 grossly intact with no focal deficits. Romberg sign and cerebellar reflexes not assessed.  Psychiatric: Normal judgment and insight. Alert and oriented x 3. Normal mood and appropriate affect.   Data Reviewed: I have personally reviewed following labs and imaging studies  CBC: Recent Labs  Lab 06/16/24 1431 06/17/24 0422 06/18/24 0423  WBC 11.5* 10.8*  11.1*  NEUTROABS  --   --  6.4  HGB 11.3* 10.3* 10.8*  HCT 39.1 35.0* 36.2  MCV 87.5 87.7 87.2  PLT 561* 481* 517*   Basic Metabolic Panel: Recent Labs  Lab 06/16/24 1431 06/16/24 1801 06/17/24 0422 06/18/24 0423  NA 139  --  136 138  K 3.3*  --  2.9* 3.4*  CL 97*  --  96* 99  CO2 33*  --  31 30  GLUCOSE 118*  --  177* 127*  BUN 14  --  14 17  CREATININE 1.00  --  1.07* 0.88  CALCIUM 9.1  --  8.6* 8.9  MG  --  2.6* 2.3 2.3  PHOS  --   --  3.1 3.1   GFR: Estimated Creatinine Clearance: 92.2 mL/min (by C-G formula based on SCr of 0.88 mg/dL). Liver Function Tests: Recent Labs  Lab 06/16/24 1431 06/18/24 0423  AST 16 14*  ALT 17 16  ALKPHOS 82 81  BILITOT 0.5 0.6  PROT 7.8 7.3  ALBUMIN 3.0* 2.9*   No results for input(s): LIPASE, AMYLASE in the last 168 hours. No results for input(s): AMMONIA in the last 168 hours. Coagulation Profile: No results for input(s): INR, PROTIME in the last 168 hours. Cardiac Enzymes: No  results for input(s): CKTOTAL, CKMB, CKMBINDEX, TROPONINI in the last 168 hours. BNP (last 3 results) No results for input(s): PROBNP in the last 8760 hours. HbA1C: Recent Labs    06/17/24 0422  HGBA1C 6.2*   CBG: Recent Labs  Lab 06/17/24 1200 06/17/24 1707 06/17/24 2135 06/18/24 0722 06/18/24 1110  GLUCAP 101* 85 102* 108* 100*   Lipid Profile: No results for input(s): CHOL, HDL, LDLCALC, TRIG, CHOLHDL, LDLDIRECT in the last 72 hours. Thyroid Function Tests: No results for input(s): TSH, T4TOTAL, FREET4, T3FREE, THYROIDAB in the last 72 hours. Anemia Panel: No results for input(s): VITAMINB12, FOLATE, FERRITIN, TIBC, IRON, RETICCTPCT in the last 72 hours. Sepsis Labs: No results for input(s): PROCALCITON, LATICACIDVEN in the last 168 hours.  Recent Results (from the past 240 hours)  Resp panel by RT-PCR (RSV, Flu A&B, Covid) Anterior Nasal Swab     Status: None   Collection Time: 06/16/24  3:57 PM   Specimen: Anterior Nasal Swab  Result Value Ref Range Status   SARS Coronavirus 2 by RT PCR NEGATIVE NEGATIVE Final    Comment: (NOTE) SARS-CoV-2 target nucleic acids are NOT DETECTED.  The SARS-CoV-2 RNA is generally detectable in upper respiratory specimens during the acute phase of infection. The lowest concentration of SARS-CoV-2 viral copies this assay can detect is 138 copies/mL. A negative result does not preclude SARS-Cov-2 infection and should not be used as the sole basis for treatment or other patient management decisions. A negative result may occur with  improper specimen collection/handling, submission of specimen other than nasopharyngeal swab, presence of viral mutation(s) within the areas targeted by this assay, and inadequate number of viral copies(<138 copies/mL). A negative result must be combined with clinical observations, patient history, and epidemiological information. The expected result is  Negative.  Fact Sheet for Patients:  BloggerCourse.com  Fact Sheet for Healthcare Providers:  SeriousBroker.it  This test is no t yet approved or cleared by the [place]  FDA and  has been authorized for detection and/or diagnosis of SARS-CoV-2 by FDA under an Emergency Use Authorization (EUA). This EUA will remain  in effect (meaning this test can be used) for the duration of the COVID-19 declaration under Section 564(b)(1) of  the Act, 21 U.S.C.section 360bbb-3(b)(1), unless the authorization is terminated  or revoked sooner.       Influenza A by PCR NEGATIVE NEGATIVE Final   Influenza B by PCR NEGATIVE NEGATIVE Final    Comment: (NOTE) The Xpert Xpress SARS-CoV-2/FLU/RSV plus assay is intended as an aid in the diagnosis of influenza from Nasopharyngeal swab specimens and should not be used as a sole basis for treatment. Nasal washings and aspirates are unacceptable for Xpert Xpress SARS-CoV-2/FLU/RSV testing.  Fact Sheet for Patients: BloggerCourse.com  Fact Sheet for Healthcare Providers: SeriousBroker.it  This test is not yet approved or cleared by the [place]  FDA and has been authorized for detection and/or diagnosis of SARS-CoV-2 by FDA under an Emergency Use Authorization (EUA). This EUA will remain in effect (meaning this test can be used) for the duration of the COVID-19 declaration under Section 564(b)(1) of the Act, 21 U.S.C. section 360bbb-3(b)(1), unless the authorization is terminated or revoked.     Resp Syncytial Virus by PCR NEGATIVE NEGATIVE Final    Comment: (NOTE) Fact Sheet for Patients: BloggerCourse.com  Fact Sheet for Healthcare Providers: SeriousBroker.it  This test is not yet approved or cleared by the [place]  FDA and has been authorized for detection and/or diagnosis of  SARS-CoV-2 by FDA under an Emergency Use Authorization (EUA). This EUA will remain in effect (meaning this test can be used) for the duration of the COVID-19 declaration under Section 564(b)(1) of the Act, 21 U.S.C. section 360bbb-3(b)(1), unless the authorization is terminated or revoked.  Performed at [facility], [address], [place], [place] [address]     Radiology Studies: DG CHEST PORT 1 VIEW Result Date: 06/18/2024 CLINICAL DATA:  Shortness of breath EXAM: PORTABLE CHEST 1 VIEW COMPARISON:  06/16/2024 FINDINGS: Cardiac shadow is enlarged but stable. Lungs are well aerated bilaterally. Mild central vascular congestion is noted without significant edema. No effusion is noted. IMPRESSION: Mild central vascular congestion without edema. Electronically Signed   By: Oneil Devonshire M.D.   On: 06/18/2024 00:34   DG Chest 2 View Result Date: 06/16/2024 CLINICAL DATA:  chest pain EXAM: CHEST - 2 VIEW COMPARISON:  February 23, 2024 FINDINGS: Fine reticular opacities throughout both lungs. No focal airspace consolidation, pleural effusion, or pneumothorax. Mild cardiomegaly.No acute fracture or destructive lesion. Multilevel thoracic osteophytosis. IMPRESSION: Fine reticular opacities throughout both lungs, worrisome for a atypical infection or viral pneumonia. Alternatively, interstitial edema could also have this appearance in the correct clinical context. Electronically Signed   By: Rogelia Myers M.D.   On: 06/16/2024 15:53   Scheduled Meds:  amLODipine   5 mg Oral Daily   enoxaparin  (LOVENOX ) injection  40 mg Subcutaneous Q24H   ferrous sulfate   325 mg Oral Q breakfast   fluticasone  furoate-vilanterol  1 puff Inhalation Daily   furosemide   80 mg Intravenous BID   glipiZIDE   5 mg Oral Daily   insulin  aspart  0-15 Units Subcutaneous TID WC   lidocaine   1 patch Transdermal Q24H   Living Better with Heart Failure Book   Does not apply Once   metoprolol  succinate  25 mg Oral  Daily   potassium chloride   20 mEq Oral Daily   potassium chloride   40 mEq Oral BID   Continuous Infusions:   LOS: 2 days   Alejandro Marker, DO [facility] Available via Epic secure chat 7am-7pm After these hours, please refer to coverage provider listed on [email or web address] 06/18/2024, 3:12 PM

## 2024-06-19 ENCOUNTER — Inpatient Hospital Stay (HOSPITAL_COMMUNITY)

## 2024-06-19 DIAGNOSIS — I5033 Acute on chronic diastolic (congestive) heart failure: Secondary | ICD-10-CM | POA: Diagnosis not present

## 2024-06-19 DIAGNOSIS — E66813 Obesity, class 3: Secondary | ICD-10-CM | POA: Diagnosis not present

## 2024-06-19 LAB — CBC WITH DIFFERENTIAL/PLATELET
Abs Immature Granulocytes: 0.05 K/uL (ref 0.00–0.07)
Basophils Absolute: 0 K/uL (ref 0.0–0.1)
Basophils Relative: 0 %
Eosinophils Absolute: 0.4 K/uL (ref 0.0–0.5)
Eosinophils Relative: 4 %
HCT: 38 % (ref 36.0–46.0)
Hemoglobin: 11.2 g/dL — ABNORMAL LOW (ref 12.0–15.0)
Immature Granulocytes: 1 %
Lymphocytes Relative: 35 %
Lymphs Abs: 3.8 K/uL (ref 0.7–4.0)
MCH: 26.2 pg (ref 26.0–34.0)
MCHC: 29.5 g/dL — ABNORMAL LOW (ref 30.0–36.0)
MCV: 89 fL (ref 80.0–100.0)
Monocytes Absolute: 0.4 K/uL (ref 0.1–1.0)
Monocytes Relative: 4 %
Neutro Abs: 6.2 K/uL (ref 1.7–7.7)
Neutrophils Relative %: 56 %
Platelets: 531 K/uL — ABNORMAL HIGH (ref 150–400)
RBC: 4.27 MIL/uL (ref 3.87–5.11)
RDW: 15.9 % — ABNORMAL HIGH (ref 11.5–15.5)
WBC: 11 K/uL — ABNORMAL HIGH (ref 4.0–10.5)
nRBC: 0 % (ref 0.0–0.2)

## 2024-06-19 LAB — COMPREHENSIVE METABOLIC PANEL WITH GFR
ALT: 15 U/L (ref 0–44)
AST: 14 U/L — ABNORMAL LOW (ref 15–41)
Albumin: 3.1 g/dL — ABNORMAL LOW (ref 3.5–5.0)
Alkaline Phosphatase: 84 U/L (ref 38–126)
Anion gap: 11 (ref 5–15)
BUN: 18 mg/dL (ref 8–23)
CO2: 28 mmol/L (ref 22–32)
Calcium: 9.2 mg/dL (ref 8.9–10.3)
Chloride: 100 mmol/L (ref 98–111)
Creatinine, Ser: 1.01 mg/dL — ABNORMAL HIGH (ref 0.44–1.00)
GFR, Estimated: >60 mL/min (ref >60)
Glucose, Bld: 143 mg/dL — ABNORMAL HIGH (ref 70–99)
Potassium: 4.6 mmol/L (ref 3.5–5.1)
Sodium: 139 mmol/L (ref 135–145)
Total Bilirubin: 0.5 mg/dL (ref 0.0–1.2)
Total Protein: 7.6 g/dL (ref 6.5–8.1)

## 2024-06-19 LAB — GLUCOSE, CAPILLARY
Glucose-Capillary: 99 mg/dL (ref 70–99)
Glucose-Capillary: 86 mg/dL (ref 70–99)
Glucose-Capillary: 116 mg/dL — ABNORMAL HIGH (ref 70–99)
Glucose-Capillary: 112 mg/dL — ABNORMAL HIGH (ref 70–99)

## 2024-06-19 LAB — MAGNESIUM: Magnesium: 2.3 mg/dL (ref 1.7–2.4)

## 2024-06-19 LAB — FERRITIN: Ferritin: 123 ng/mL (ref 11–307)

## 2024-06-19 LAB — IRON AND TIBC
Iron: 29 ug/dL (ref 28–170)
Saturation Ratios: 12 % (ref 10.4–31.8)
TIBC: 251 ug/dL (ref 250–450)
UIBC: 222 ug/dL

## 2024-06-19 LAB — PHOSPHORUS: Phosphorus: 3.2 mg/dL (ref 2.5–4.6)

## 2024-06-19 LAB — FOLATE: Folate: 9.4 ng/mL (ref >5.9)

## 2024-06-19 LAB — VITAMIN B12: Vitamin B-12: 566 pg/mL (ref 180–914)

## 2024-06-19 MED ORDER — GUAIFENESIN ER 600 MG PO TB12: 1200.0000 mg | ORAL_TABLET | Freq: Two times a day (BID) | ORAL | Status: DC

## 2024-06-19 MED ORDER — GUAIFENESIN ER 600 MG PO TB12
1200.0000 mg | ORAL_TABLET | Freq: Two times a day (BID) | ORAL | Status: DC
  Administered 2024-06-19 – 2024-06-20 (×3): 1200 mg via ORAL
  Filled 2024-06-19 (×3): qty 2

## 2024-06-19 MED ORDER — LIDOCAINE 5 % EX PTCH
1.0000 | MEDICATED_PATCH | CUTANEOUS | Status: DC
  Administered 2024-06-19: 1 via TRANSDERMAL
  Filled 2024-06-19: qty 1

## 2024-06-19 MED ORDER — DICLOFENAC SODIUM 1 % EX GEL
2.0000 g | Freq: Four times a day (QID) | CUTANEOUS | Status: DC
  Administered 2024-06-19 – 2024-06-20 (×5): 2 g via TOPICAL
  Filled 2024-06-19: qty 100

## 2024-06-19 MED ORDER — FLUCONAZOLE 150 MG PO TABS: 150.0000 mg | ORAL_TABLET | Freq: Once | ORAL | Status: DC

## 2024-06-19 NOTE — Plan of Care (Signed)
  Problem: Fluid Volume: Goal: Ability to maintain a balanced intake and output will improve Outcome: Progressing   Problem: Metabolic: Goal: Ability to maintain appropriate glucose levels will improve Outcome: Progressing   Problem: Clinical Measurements: Goal: Ability to maintain clinical measurements within normal limits will improve Outcome: Progressing Goal: Diagnostic test results will improve Outcome: Progressing Goal: Respiratory complications will improve Outcome: Progressing Goal: Cardiovascular complication will be avoided Outcome: Progressing

## 2024-06-19 NOTE — Progress Notes (Signed)
 PROGRESS NOTE    Kathryn Mahoney  FMW:[id number] DOB: [birth year] DOA: 06/16/2024 PCP: [facility]   Brief Narrative:  Kathryn Mahoney is a 63 y.o. female with medical history significant for chronic diastolic heart failure, asthma, B12 deficiency, class III obesity, COPD, type 2 diabetes, hypertension, frequent dyspnea, urinary incontinence and chronic hypoxemia on intermittent home oxygen who presents to the ED for evaluation of shortness of breath, chest pain and abdominal distention.  Patient reports that over the last few days, she has had increased weight gain and abdominal distention.  She has had dietary indiscretion and came to the ED for further evaluation and is now being diuresed with high dose IV Lasix  and slowly improving.  Continues to remain volume overloaded so we will continue diuresis.   Assessment and Plan:  Acute on Chronic Diastolic Heart Failure:  Last TTE on 02/24/2024 shows EF 55 to 60%, G1DD, mild to moderately dilated LA;  Pt presented with SOB, abdominal distention, cough and chest discomfort. - BNP within normal limits at 64.2 (underestimated due to obesity), initial CXR with interstitial edema. Pt with clinical signs of CHF exacerbation -Acute CHF likely 2/2 under-diuresing and likely dietary indiscretion.  At discharge from her last hospitalization, patient was advised to take torsemide  40 mg twice a day however she reports taking only 2 of the torsemide  20 mg pills in the morning.  She will likely need a higher dose of torsemide  at discharge and dietary education. -C/w Metoprolol  Succinate 25 mg po Daily  -C/w IV Lasix  80 mg twice daily. Continue Toprol  XL. Strict I&O, daily weights.  Intake/Output Summary (Last 24 hours) at 06/19/2024 1357 Last data filed at 06/19/2024 0900 Gross per 24 hour  Intake 120 ml  Output --  Net 120 ml  -Maintain K+ > 4.0, Mag > 2.0. CTM on Telemetry -Repeat CXR 8/23 showed mild central vascular congestion w/o edema  T2DM:  Last A1c 6.9% 3 months ago. Repeat HbA1c this visit is 6.2. Continue Glipizide  5 mg po Daily and Moderate Novolog  SSI AC; C/w CBG monitoring and current trend ranging from 81-99 the last 3 checks   Essential HTN: BP stable with SBP in the 110s to 130s. Reports she discontinued Entresto  and spironolactone  due to low BP and dizziness, her PCP currently monitoring her on Amlodipine  and Toprol  XL only. Continue Amlodipine  5 mg po Daily and Metoprolol  Succinate 25 mg po Daily. CTM BP per Protocol. Last BP reading was 128/81  Normocytic Anemia: Hgb/Hct fairly stable and now went from 10.8/36.2 -> 11.2/38.0 w/ MCV of 89.0. C/w Ferrous Sulfate  325 mg po Daily. Checked Anemia Panel which showed an iron level of 29, UIBC 222, TIBC 251, saturation ratios of 12%, ferritin level 123, folate level 9.4 and vitamin B12 566. CTM for S/Sx of Bleeding; No overt bleeding noted. Repeat CBC in the AM  Renal Insufficiency: BUN/Cr went from 14/1.00 -> 14/1.07 -> 17/0.88 -> 18/1.01 . Avoid Nephrotoxic Medications, Contrast Dyes, Hypotension and Dehydration to Ensure Adequate Renal Perfusion and will need to Renally Adjust Meds. CTM and Trend Renal Function carefully as patient is being diuresed and repeat CMP in the AM   Thrombocytosis: Likely Reactive. Plt Count went from 561 -> 481 -> 517 -> 531. CTM and Trend and repeat CBC in the AM  Back Pain: Will continue Acetaminophen  650 mg po q6hprn Mild Pain; Added Oxycodone  IR 5 mg every 6 as needed for moderate pain and this has helped the patient. She is requesting Oxycodone  Script @ Discharge.  C/w Lidocaine  5% Patch 1 patch TD   Leukocytosis: WBC went from 11.5 -> 10.8 -> 11.1 -> 11.0. Likely Reactive. CTM and Trend and repeat CBC in the AM  Yeast Infection: Ordered for po Fluconazole  150 mg x1 yesterday and still having symptoms so will re-order for second dose of Fluconazole  150 mg x1 on 8/26. Reports being on Jardiance (Does not appear to be on patient's MAR) but nonetheless  will be held.    COPD with ? Chronic Respiratory Failure w/ Hypoxia: Stable and chronic, not in exacerbation. Continue home bronchodilator w/ Breo Ellipta  200-25 mcg/act 1 puff and Duoneb 3 mL q6hprn Wheezing and SOB.  Patient states that she wears supplemental oxygen intermittently at home and is on 2 L today.  Change Guaifenesin -Dextromethorphan  5 mL po q4hprn Cough to guaifenesin  1200 g p.o. twice daily per patient request and Fluticasone  2 spray Each Nare Daily PRN; Repeat CXR this AM showed Enlargement of the Cardiac Silhouette but no acute Cardiopulmonary findings   Hypokalemia: K+ went from 3.3 -> 2.9 -> 3.4 -> 4.6. C/w po KCL 20 mEQ daily while diuresing w/ IV Furosemide  80 mg po BID. CTM and Replete as Necessary and repeat CMP in the AM  Hypoalbuminemia: Patient's Albumin Lvl was 3.1. CTM and Trend and repeat CMP in the AM  OSA: CPAP at bedtime   Class III (Morbid) Obesity: Complicates overall prognosis and care. Estimated body mass index is 42.29 kg/m as calculated from the following:   Height as of this encounter: 5' 9 (1.753 m).   Weight as of this encounter: 129.9 kg. Weight Loss and Dietary Counseling given  DVT prophylaxis: enoxaparin  (LOVENOX ) injection 40 mg Start: 06/17/24 1000    Code Status: Full Code Family Communication: Discussed with the patient's daughter at bedside  Disposition Plan:  Level of care: Telemetry Status is: Inpatient Remains inpatient appropriate because: Remains volume overloaded and will need continued diuresis   Consultants:  None  Procedures:  As delineated as above  Antimicrobials:  Anti-infectives (From admission, onward)    Start     Dose/Rate Route Frequency Ordered Stop   06/21/24 1000  fluconazole  (DIFLUCAN ) tablet 150 mg        150 mg Oral  Once 06/19/24 1400     06/18/24 1200  fluconazole  (DIFLUCAN ) tablet 150 mg        150 mg Oral  Once 06/18/24 1100 06/18/24 1321   06/16/24 1945  azithromycin  (ZITHROMAX ) 500 mg in sodium  chloride 0.9 % 250 mL IVPB        500 mg 250 mL/hr over 60 Minutes Intravenous  Once 06/16/24 1933 06/16/24 2230       Subjective: Seen and examined at bedside thinks that she is improving slowly.  Still having a little bit of symptoms from her yeast infection but thinks it is not bad.  No nausea or vomiting.  Denies any other concerns or complaints at this time and thinks her abdominal distention is not as bad and her breathing is not as labored.  No other concerns or complaints at this time.  Objective: Vitals:   06/19/24 0201 06/19/24 0437 06/19/24 0500 06/19/24 0926  BP:  128/81    Pulse:  95    Resp: 19 20    Temp:  98 F (36.7 C)    TempSrc:  Oral    SpO2:  97%  96%  Weight:   129.9 kg   Height:        Intake/Output Summary (Last 24  hours) at 06/19/2024 1402 Last data filed at 06/19/2024 0900 Gross per 24 hour  Intake 120 ml  Output --  Net 120 ml   Filed Weights   06/17/24 2000 06/18/24 0502 06/19/24 0500  Weight: 133.6 kg 129.7 kg 129.9 kg   Examination: Physical Exam:  Constitutional: WN/WD morbidly obese African-American female in no acute distress Respiratory: Diminished to auscultation bilaterally with some coarse breath sounds, no wheezing, rales, rhonchi or crackles. Normal respiratory effort and patient is not tachypenic. No accessory muscle use.  Unlabored breathing but is wearing supplemental oxygen nasal cannula Cardiovascular: RRR, no murmurs / rubs / gallops. S1 and S2 auscultated.  1+ extremity edema Abdomen: Soft, non-tender, distended secondary to body habitus.. Bowel sounds positive.  GU: Deferred. Musculoskeletal: No clubbing / cyanosis of digits/nails. No joint deformity upper and lower extremities.  Skin: No rashes, lesions, ulcers on limited skin evaluation. No induration; Warm and dry.  Neurologic: CN 2-12 grossly intact with no focal deficits. Romberg sign and cerebellar reflexes not assessed.  Psychiatric: Normal judgment and insight. Alert  and oriented x 3. Normal mood and appropriate affect.   Data Reviewed: I have personally reviewed following labs and imaging studies  CBC: Recent Labs  Lab 06/16/24 1431 06/17/24 0422 06/18/24 0423 06/19/24 0340  WBC 11.5* 10.8* 11.1* 11.0*  NEUTROABS  --   --  6.4 6.2  HGB 11.3* 10.3* 10.8* 11.2*  HCT 39.1 35.0* 36.2 38.0  MCV 87.5 87.7 87.2 89.0  PLT 561* 481* 517* 531*   Basic Metabolic Panel: Recent Labs  Lab 06/16/24 1431 06/16/24 1801 06/17/24 0422 06/18/24 0423 06/19/24 0340  NA 139  --  136 138 139  K 3.3*  --  2.9* 3.4* 4.6  CL 97*  --  96* 99 100  CO2 33*  --  31 30 28   GLUCOSE 118*  --  177* 127* 143*  BUN 14  --  14 17 18   CREATININE 1.00  --  1.07* 0.88 1.01*  CALCIUM 9.1  --  8.6* 8.9 9.2  MG  --  2.6* 2.3 2.3 2.3  PHOS  --   --  3.1 3.1 3.2   GFR: Estimated Creatinine Clearance: 80.4 mL/min (A) (by C-G formula based on SCr of 1.01 mg/dL (H)). Liver Function Tests: Recent Labs  Lab 06/16/24 1431 06/18/24 0423 06/19/24 0340  AST 16 14* 14*  ALT 17 16 15   ALKPHOS 82 81 84  BILITOT 0.5 0.6 0.5  PROT 7.8 7.3 7.6  ALBUMIN 3.0* 2.9* 3.1*   No results for input(s): LIPASE, AMYLASE in the last 168 hours. No results for input(s): AMMONIA in the last 168 hours. Coagulation Profile: No results for input(s): INR, PROTIME in the last 168 hours. Cardiac Enzymes: No results for input(s): CKTOTAL, CKMB, CKMBINDEX, TROPONINI in the last 168 hours. BNP (last 3 results) No results for input(s): PROBNP in the last 8760 hours. HbA1C: Recent Labs    06/17/24 0422  HGBA1C 6.2*   CBG: Recent Labs  Lab 06/18/24 1110 06/18/24 1619 06/18/24 2100 06/19/24 0748 06/19/24 1203  GLUCAP 100* 94 81 99 86   Lipid Profile: No results for input(s): CHOL, HDL, LDLCALC, TRIG, CHOLHDL, LDLDIRECT in the last 72 hours. Thyroid Function Tests: No results for input(s): TSH, T4TOTAL, FREET4, T3FREE, THYROIDAB in the last 72  hours. Anemia Panel: Recent Labs    06/19/24 0340  VITAMINB12 566  FOLATE 9.4  FERRITIN 123  TIBC 251  IRON 29   Sepsis Labs: No results for  input(s): PROCALCITON, LATICACIDVEN in the last 168 hours.  Recent Results (from the past 240 hours)  Resp panel by RT-PCR (RSV, Flu A&B, Covid) Anterior Nasal Swab     Status: None   Collection Time: 06/16/24  3:57 PM   Specimen: Anterior Nasal Swab  Result Value Ref Range Status   SARS Coronavirus 2 by RT PCR NEGATIVE NEGATIVE Final    Comment: (NOTE) SARS-CoV-2 target nucleic acids are NOT DETECTED.  The SARS-CoV-2 RNA is generally detectable in upper respiratory specimens during the acute phase of infection. The lowest concentration of SARS-CoV-2 viral copies this assay can detect is 138 copies/mL. A negative result does not preclude SARS-Cov-2 infection and should not be used as the sole basis for treatment or other patient management decisions. A negative result may occur with  improper specimen collection/handling, submission of specimen other than nasopharyngeal swab, presence of viral mutation(s) within the areas targeted by this assay, and inadequate number of viral copies(<138 copies/mL). A negative result must be combined with clinical observations, patient history, and epidemiological information. The expected result is Negative.  Fact Sheet for Patients:  BloggerCourse.com  Fact Sheet for Healthcare Providers:  SeriousBroker.it  This test is no t yet approved or cleared by the [place]  FDA and  has been authorized for detection and/or diagnosis of SARS-CoV-2 by FDA under an Emergency Use Authorization (EUA). This EUA will remain  in effect (meaning this test can be used) for the duration of the COVID-19 declaration under Section 564(b)(1) of the Act, 21 U.S.C.section 360bbb-3(b)(1), unless the authorization is terminated  or revoked sooner.       Influenza  A by PCR NEGATIVE NEGATIVE Final   Influenza B by PCR NEGATIVE NEGATIVE Final    Comment: (NOTE) The Xpert Xpress SARS-CoV-2/FLU/RSV plus assay is intended as an aid in the diagnosis of influenza from Nasopharyngeal swab specimens and should not be used as a sole basis for treatment. Nasal washings and aspirates are unacceptable for Xpert Xpress SARS-CoV-2/FLU/RSV testing.  Fact Sheet for Patients: BloggerCourse.com  Fact Sheet for Healthcare Providers: SeriousBroker.it  This test is not yet approved or cleared by the [place]  FDA and has been authorized for detection and/or diagnosis of SARS-CoV-2 by FDA under an Emergency Use Authorization (EUA). This EUA will remain in effect (meaning this test can be used) for the duration of the COVID-19 declaration under Section 564(b)(1) of the Act, 21 U.S.C. section 360bbb-3(b)(1), unless the authorization is terminated or revoked.     Resp Syncytial Virus by PCR NEGATIVE NEGATIVE Final    Comment: (NOTE) Fact Sheet for Patients: BloggerCourse.com  Fact Sheet for Healthcare Providers: SeriousBroker.it  This test is not yet approved or cleared by the [place]  FDA and has been authorized for detection and/or diagnosis of SARS-CoV-2 by FDA under an Emergency Use Authorization (EUA). This EUA will remain in effect (meaning this test can be used) for the duration of the COVID-19 declaration under Section 564(b)(1) of the Act, 21 U.S.C. section 360bbb-3(b)(1), unless the authorization is terminated or revoked.  Performed at [facility], [address], [place], [place] [address]     Radiology Studies: DG CHEST PORT 1 VIEW Result Date: 06/19/2024 CLINICAL DATA:  Shortness of breath. EXAM: PORTABLE CHEST 1 VIEW COMPARISON:  The 01/14/2024 FINDINGS: The cardio pericardial silhouette is enlarged. Right lung clear.  Stable appearance left lung without focal consolidation or pleural effusion. No acute bony abnormality. Telemetry leads overlie the chest. IMPRESSION: Enlargement of the cardiopericardial silhouette.  No acute cardiopulmonary findings. Electronically Signed   By: Camellia Candle M.D.   On: 06/19/2024 09:40   DG CHEST PORT 1 VIEW Result Date: 06/18/2024 CLINICAL DATA:  Shortness of breath EXAM: PORTABLE CHEST 1 VIEW COMPARISON:  06/16/2024 FINDINGS: Cardiac shadow is enlarged but stable. Lungs are well aerated bilaterally. Mild central vascular congestion is noted without significant edema. No effusion is noted. IMPRESSION: Mild central vascular congestion without edema. Electronically Signed   By: Oneil Devonshire M.D.   On: 06/18/2024 00:34   Scheduled Meds:  amLODipine   5 mg Oral Daily   diclofenac  Sodium  2 g Topical QID   enoxaparin  (LOVENOX ) injection  40 mg Subcutaneous Q24H   ferrous sulfate   325 mg Oral Q breakfast   [START ON 06/21/2024] fluconazole   150 mg Oral Once   fluticasone  furoate-vilanterol  1 puff Inhalation Daily   furosemide   80 mg Intravenous BID   glipiZIDE   5 mg Oral Daily   guaiFENesin   1,200 mg Oral BID   insulin  aspart  0-15 Units Subcutaneous TID WC   lidocaine   1 patch Transdermal Q24H   Living Better with Heart Failure Book   Does not apply Once   metoprolol  succinate  25 mg Oral Daily   potassium chloride   20 mEq Oral Daily   Continuous Infusions:   LOS: 3 days   Alejandro Marker, DO [facility] Available via Epic secure chat 7am-7pm After these hours, please refer to coverage provider listed on [email or web address] 06/19/2024, 2:02 PM

## 2024-06-20 ENCOUNTER — Inpatient Hospital Stay (HOSPITAL_COMMUNITY)

## 2024-06-20 ENCOUNTER — Other Ambulatory Visit (HOSPITAL_COMMUNITY): Payer: Self-pay

## 2024-06-20 DIAGNOSIS — I5033 Acute on chronic diastolic (congestive) heart failure: Secondary | ICD-10-CM | POA: Diagnosis not present

## 2024-06-20 LAB — CBC WITH DIFFERENTIAL/PLATELET
Abs Immature Granulocytes: 0.05 K/uL (ref 0.00–0.07)
Basophils Absolute: 0.1 K/uL (ref 0.0–0.1)
Basophils Relative: 1 %
Eosinophils Absolute: 0.4 K/uL (ref 0.0–0.5)
Eosinophils Relative: 4 %
HCT: 42.4 % (ref 36.0–46.0)
Hemoglobin: 11.5 g/dL — ABNORMAL LOW (ref 12.0–15.0)
Immature Granulocytes: 1 %
Lymphocytes Relative: 41 %
Lymphs Abs: 4.3 K/uL — ABNORMAL HIGH (ref 0.7–4.0)
MCH: 26 pg (ref 26.0–34.0)
MCHC: 27.1 g/dL — ABNORMAL LOW (ref 30.0–36.0)
MCV: 95.7 fL (ref 80.0–100.0)
Monocytes Absolute: 0.5 K/uL (ref 0.1–1.0)
Monocytes Relative: 5 %
Neutro Abs: 5.2 K/uL (ref 1.7–7.7)
Neutrophils Relative %: 48 %
Platelets: 482 K/uL — ABNORMAL HIGH (ref 150–400)
RBC: 4.43 MIL/uL (ref 3.87–5.11)
RDW: 16.1 % — ABNORMAL HIGH (ref 11.5–15.5)
WBC: 10.5 K/uL (ref 4.0–10.5)
nRBC: 0 % (ref 0.0–0.2)

## 2024-06-20 LAB — COMPREHENSIVE METABOLIC PANEL WITH GFR
ALT: 16 U/L (ref 0–44)
AST: 18 U/L (ref 15–41)
Albumin: 3.1 g/dL — ABNORMAL LOW (ref 3.5–5.0)
Alkaline Phosphatase: 85 U/L (ref 38–126)
Anion gap: 11 (ref 5–15)
BUN: 21 mg/dL (ref 8–23)
CO2: 28 mmol/L (ref 22–32)
Calcium: 9.2 mg/dL (ref 8.9–10.3)
Chloride: 97 mmol/L — ABNORMAL LOW (ref 98–111)
Creatinine, Ser: 0.78 mg/dL (ref 0.44–1.00)
GFR, Estimated: >60 mL/min (ref >60)
Glucose, Bld: 120 mg/dL — ABNORMAL HIGH (ref 70–99)
Potassium: 4.1 mmol/L (ref 3.5–5.1)
Sodium: 136 mmol/L (ref 135–145)
Total Bilirubin: 0.6 mg/dL (ref 0.0–1.2)
Total Protein: 7.6 g/dL (ref 6.5–8.1)

## 2024-06-20 LAB — GLUCOSE, CAPILLARY
Glucose-Capillary: 155 mg/dL — ABNORMAL HIGH (ref 70–99)
Glucose-Capillary: 55 mg/dL — ABNORMAL LOW (ref 70–99)
Glucose-Capillary: 68 mg/dL — ABNORMAL LOW (ref 70–99)
Glucose-Capillary: 94 mg/dL (ref 70–99)

## 2024-06-20 LAB — MAGNESIUM: Magnesium: 2.5 mg/dL — ABNORMAL HIGH (ref 1.7–2.4)

## 2024-06-20 LAB — PHOSPHORUS: Phosphorus: 3.7 mg/dL (ref 2.5–4.6)

## 2024-06-20 MED ORDER — GLIPIZIDE 5 MG PO TABS: 5.0000 mg | ORAL_TABLET | Freq: Every day | ORAL | 2 refills | Status: DC

## 2024-06-20 MED ORDER — GUAIFENESIN-DM 100-10 MG/5ML PO SYRP
5.0000 mL | ORAL_SOLUTION | ORAL | 0 refills | Status: AC | PRN
  Filled 2024-06-20: qty 118, 4d supply, fill #0

## 2024-06-20 MED ORDER — METOPROLOL SUCCINATE ER 25 MG PO TB24: 25.0000 mg | ORAL_TABLET | Freq: Every day | ORAL | 0 refills | Status: DC

## 2024-06-20 MED ORDER — TORSEMIDE 20 MG PO TABS: 40.0000 mg | ORAL_TABLET | Freq: Two times a day (BID) | ORAL | Status: DC

## 2024-06-20 MED ORDER — GLIPIZIDE 5 MG PO TABS
5.0000 mg | ORAL_TABLET | Freq: Every day | ORAL | 2 refills | Status: AC
  Filled 2024-06-20 (×2): qty 90, 90d supply, fill #0

## 2024-06-20 MED ORDER — GUAIFENESIN-DM 100-10 MG/5ML PO SYRP: 5.0000 mL | ORAL_SOLUTION | ORAL | 0 refills | Status: DC | PRN

## 2024-06-20 MED ORDER — FLUCONAZOLE 150 MG PO TABS
150.0000 mg | ORAL_TABLET | Freq: Once | ORAL | 0 refills | Status: AC
  Filled 2024-06-20: qty 1, 1d supply, fill #0

## 2024-06-20 MED ORDER — OXYCODONE HCL 5 MG PO TABS
5.0000 mg | ORAL_TABLET | Freq: Four times a day (QID) | ORAL | 0 refills | Status: AC | PRN
  Filled 2024-06-20: qty 20, 5d supply, fill #0

## 2024-06-20 MED ORDER — GUAIFENESIN ER 600 MG PO TB12: 1200.0000 mg | ORAL_TABLET | Freq: Two times a day (BID) | ORAL | 0 refills | Status: DC

## 2024-06-20 MED ORDER — TORSEMIDE 20 MG PO TABS: 40.0000 mg | ORAL_TABLET | Freq: Two times a day (BID) | ORAL | Status: AC

## 2024-06-20 MED ORDER — METOPROLOL SUCCINATE ER 25 MG PO TB24
25.0000 mg | ORAL_TABLET | Freq: Every day | ORAL | 0 refills | Status: AC
  Filled 2024-06-20: qty 90, 90d supply, fill #0

## 2024-06-20 MED ORDER — GUAIFENESIN ER 600 MG PO TB12
1200.0000 mg | ORAL_TABLET | Freq: Two times a day (BID) | ORAL | 0 refills | Status: AC
  Filled 2024-06-20: qty 20, 5d supply, fill #0

## 2024-06-20 MED ORDER — GUAIFENESIN-DM 100-10 MG/5ML PO SYRP: 5.0000 mL | ORAL_SOLUTION | ORAL | Status: DC | PRN

## 2024-06-20 NOTE — Progress Notes (Signed)
 SATURATION QUALIFICATIONS: (This note is used to comply with regulatory documentation for home oxygen)  Patient Saturations on Room Air at Rest = 93%  Patient Saturations on Room Air while Ambulating = 91%  Patient Saturations on 0 Liters of oxygen while Ambulating = 91%  Please briefly explain why patient needs home oxygen: No O2 needed at this time. Home O2 already set up

## 2024-06-20 NOTE — Progress Notes (Signed)
 Discharge meds in a secure bag delivered to pt in room. Pt stated she does not have any glipizide  at home. New script for glipizide  sent to [facility] will transfer back so pt will have them at d/c

## 2024-06-20 NOTE — Discharge Summary (Signed)
 Physician Discharge Summary  Kathryn Mahoney FMW:[id number] DOB: [birth year] DOA: 06/16/2024  PCP: [facility]  Admit date: 06/16/2024 Discharge date: 06/20/2024  Admitted From: Home Disposition: Home Recommendations for Outpatient Follow-up:  Follow up with PCP in 1-2 weeks Please obtain BMP/CBC in one week  Home Health: None Equipment/Devices: None Discharge Condition: Stable CODE STATUS: Full code Diet recommendation: cardiac  Brief/Interim Summary:  63 y.o. female with medical history significant for chronic diastolic heart failure, asthma, B12 deficiency, class III obesity, COPD, type 2 diabetes, hypertension, frequent dyspnea, urinary incontinence and chronic hypoxemia on intermittent home oxygen who presents to the ED for evaluation of shortness of breath, chest pain and abdominal distention.  Patient reports that over the last few days, she has had increased weight gain and abdominal distention.  She has had dietary indiscretion and came to the ED for further evaluation and  diuresed with high dose IV Lasix  with improvement.  Discharge Diagnoses:  Principal Problem:   Acute on chronic diastolic (congestive) heart failure (HCC) Active Problems:   Obesity, Class III, BMI 40-49.9 (morbid obesity)      Acute on Chronic Diastolic Heart Failure:  Last TTE on 02/24/2024 shows EF 55 to 60%, G1DD, mild to moderately dilated LA;  Pt presented with SOB, abdominal distention, cough and chest discomfort. - BNP within normal limits at 64.2 (underestimated due to obesity), initial CXR with interstitial edema. Pt with clinical signs of CHF exacerbation -Acute CHF likely 2/2 under-diuresing and likely dietary indiscretion.  At discharge from her last hospitalization, patient was advised to take torsemide  40 mg twice a day however she reports taking only 2 of the torsemide  20 mg pills in the morning.  She will likely need a higher dose of torsemide  at discharge and dietary  education. -C/w Metoprolol  Succinate 25 mg po Daily  -Repeat CXR 8/23 showed mild central vascular congestion w/o edema -Discharged on torsemide  40 mg twice daily   T2DM: Last A1c 6.9% 3 months ago. Repeat HbA1c this visit is 6.2. Continue Glipizide  5 mg po Daily   Essential HTN: BP stable with SBP in the 110s to 130s. Reports she discontinued Entresto  and spironolactone  due to low BP and dizziness, on discharge I have stopped the Norvasc  due to soft blood pressure.     Normocytic Anemia: Hgb/Hct fairly stable . Checked Anemia Panel which showed an iron level of 29, UIBC 222, TIBC 251, saturation ratios of 12%, ferritin level 123, folate level 9.4 and vitamin B12 566. CTM for S/Sx of Bleeding; No overt bleeding noted.  Can continue iron    Thrombocytosis: Stable   Back Pain: Continue Tylenol .  Give a prescription for oxycodone  with 20 tablets.    Leukocytosis: Resolved.   Yeast Infection: Fluconazole  150 mg x 2 doses given.     COPD with ? Chronic Respiratory Failure w/ Hypoxia: Stable and chronic, not in exacerbation. Continue home bronchodilator w/ Breo Ellipta  200-25 mcg/act 1 puff.     Hypokalemia: Resolved   OSA: CPAP at bedtime   Class III (Morbid) Obesity: Complicates overall prognosis and care. Estimated body mass index is 42.29 kg/m as calculated from the following:   Height as of this encounter: 5' 9 (1.753 m).   Weight as of this encounter: 129.9 kg. Weight Loss and Dietary Counseling given     Estimated body mass index is 42.32 kg/m as calculated from the following:   Height as of this encounter: 5' 9 (1.753 m).   Weight as of this encounter: 130 kg.  Discharge Instructions  Discharge Instructions     Diet - low sodium heart healthy   Complete by: As directed    Increase activity slowly   Complete by: As directed       Allergies as of 06/20/2024       Reactions   Contrast Media [iodinated Contrast Media] Other (See Comments)   Coughing, sick to  stomach, increase blood pressure    Empagliflozin Other (See Comments)   Yeast infection (reaction to Jardiance)   Losartan  Other (See Comments)   Back pain   Torsemide  Itching   Pt takes this medication because nothing else works ( takes cortisone cream for the itching)   Lisinopril  Nausea And Vomiting   Metformin  Palpitations   Ondansetron Swelling        Medication List     STOP taking these medications    amLODipine  5 MG tablet Commonly known as: NORVASC    sacubitril -valsartan  24-26 MG Commonly known as: ENTRESTO    spironolactone  25 MG tablet Commonly known as: ALDACTONE        TAKE these medications    acetaminophen  500 MG tablet Commonly known as: TYLENOL  Take 500 mg by mouth daily.   albuterol  108 (90 Base) MCG/ACT inhaler Commonly known as: VENTOLIN  HFA Inhale 2 puffs into the lungs every 4 (four) hours as needed for wheezing or shortness of breath.   allopurinol 100 MG tablet Commonly known as: ZYLOPRIM Take 100 mg by mouth daily as needed.   B-12 PO Take 1 tablet by mouth daily.   blood glucose meter kit and supplies Kit Dispense based on patient and insurance preference. Use up to four times daily as directed.   budesonide-formoterol  160-4.5 MCG/ACT inhaler Commonly known as: SYMBICORT Inhale 2 puffs into the lungs every morning.   diclofenac  Sodium 1 % Gel Commonly known as: VOLTAREN  Apply 1 application  topically See admin instructions. Apply topically every morning and every evening as needed for pain   ergocalciferol  1.25 MG (50000 UT) capsule Commonly known as: VITAMIN D2 Take 50,000 Units by mouth once a week.   ferrous sulfate  325 (65 FE) MG tablet Take 1 tablet (325 mg total) by mouth daily with breakfast.   fluconazole  150 MG tablet Commonly known as: DIFLUCAN  Take 1 tablet (150 mg total) by mouth once for 1 dose. Start taking on: June 21, 2024   fluticasone  50 MCG/ACT nasal spray Commonly known as: FLONASE  Place 2 sprays  into both nostrils daily. What changed:  when to take this reasons to take this   glipiZIDE  5 MG tablet Commonly known as: GLUCOTROL  Take 1 tablet (5 mg total) by mouth daily.   guaiFENesin  600 MG 12 hr tablet Commonly known as: MUCINEX  Take 2 tablets (1,200 mg total) by mouth 2 (two) times daily.   hydrocortisone cream 1 % Apply 1 application. topically 2 (two) times daily as needed for itching.   lidocaine  5 % Commonly known as: LIDODERM  Place 1 patch onto the skin daily. Remove & Discard patch within 12 hours or as directed by MD   metoprolol  succinate 25 MG 24 hr tablet Commonly known as: TOPROL -XL Take 1 tablet (25 mg total) by mouth daily.   oxyCODONE  5 MG immediate release tablet Commonly known as: Oxy IR/ROXICODONE  Take 1 tablet (5 mg total) by mouth every 6 (six) hours as needed for severe pain (pain score 7-10).   Ozempic (2 MG/DOSE) 8 MG/3ML Sopn Generic drug: Semaglutide (2 MG/DOSE) Inject 2 mg into the skin once a week.  polyethylene glycol 17 g packet Commonly known as: MIRALAX  / GLYCOLAX  Take 17 g by mouth daily.   senna-docusate 8.6-50 MG tablet Commonly known as: Senokot-S Take 1 tablet by mouth 2 (two) times daily.   torsemide  20 MG tablet Commonly known as: DEMADEX  Take 2 tablets (40 mg total) by mouth 2 (two) times daily.   Tussin DM 100-10 MG/5ML liquid Generic drug: Dextromethorphan -guaiFENesin  Take 5 mLs by mouth every 4 (four) hours as needed for cough.        Follow-up Information     [facility].   Contact information: [address] [place] [address] [phone]                Allergies  Allergen Reactions   Contrast Media [Iodinated Contrast Media] Other (See Comments)    Coughing, sick to stomach, increase blood pressure    Empagliflozin Other (See Comments)    Yeast infection (reaction to Jardiance)   Losartan  Other (See Comments)    Back pain   Torsemide  Itching    Pt takes this  medication because nothing else works ( takes cortisone cream for the itching)   Lisinopril  Nausea And Vomiting   Metformin  Palpitations   Ondansetron Swelling    Consultations:none   Procedures/Studies: DG CHEST PORT 1 VIEW Result Date: 06/20/2024 CLINICAL DATA:  Shortness of breath EXAM: PORTABLE CHEST 1 VIEW COMPARISON:  None Available. FINDINGS: Normal mediastinum and cardiac silhouette. Normal pulmonary vasculature. No evidence of effusion, infiltrate, or pneumothorax. No acute bony abnormality. IMPRESSION: No acute cardiopulmonary process. Electronically Signed   By: Jackquline Boxer M.D.   On: 06/20/2024 09:39   DG CHEST PORT 1 VIEW Result Date: 06/19/2024 CLINICAL DATA:  Shortness of breath. EXAM: PORTABLE CHEST 1 VIEW COMPARISON:  The 01/14/2024 FINDINGS: The cardio pericardial silhouette is enlarged. Right lung clear. Stable appearance left lung without focal consolidation or pleural effusion. No acute bony abnormality. Telemetry leads overlie the chest. IMPRESSION: Enlargement of the cardiopericardial silhouette. No acute cardiopulmonary findings. Electronically Signed   By: Camellia Candle M.D.   On: 06/19/2024 09:40   DG CHEST PORT 1 VIEW Result Date: 06/18/2024 CLINICAL DATA:  Shortness of breath EXAM: PORTABLE CHEST 1 VIEW COMPARISON:  06/16/2024 FINDINGS: Cardiac shadow is enlarged but stable. Lungs are well aerated bilaterally. Mild central vascular congestion is noted without significant edema. No effusion is noted. IMPRESSION: Mild central vascular congestion without edema. Electronically Signed   By: Oneil Devonshire M.D.   On: 06/18/2024 00:34   DG Chest 2 View Result Date: 06/16/2024 CLINICAL DATA:  chest pain EXAM: CHEST - 2 VIEW COMPARISON:  February 23, 2024 FINDINGS: Fine reticular opacities throughout both lungs. No focal airspace consolidation, pleural effusion, or pneumothorax. Mild cardiomegaly.No acute fracture or destructive lesion. Multilevel thoracic osteophytosis.  IMPRESSION: Fine reticular opacities throughout both lungs, worrisome for a atypical infection or viral pneumonia. Alternatively, interstitial edema could also have this appearance in the correct clinical context. Electronically Signed   By: Rogelia Myers M.D.   On: 06/16/2024 15:53   (Echo, Carotid, EGD, Colonoscopy, ERCP)    Subjective:  Resting in bed in no acute distress anxious to go home on room air ambulated in the hallway without hypoxia she does have as needed oxygen at home Discharge Exam: Vitals:   06/20/24 0903 06/20/24 0914  BP: 110/65   Pulse: 86   Resp:    Temp:    SpO2:  93%   Vitals:   06/20/24 0700 06/20/24  9182 06/20/24 0903 06/20/24 0914  BP:   110/65   Pulse:   86   Resp:      Temp:      TempSrc:      SpO2:  97%  93%  Weight: 130 kg     Height:        General: Pt is alert, awake, not in acute distress Cardiovascular: RRR, S1/S2 +, no rubs, no gallops Respiratory: CTA bilaterally, no wheezing, no rhonchi Abdominal: Soft, NT, ND, bowel sounds + Extremities: no edema, no cyanosis    The results of significant diagnostics from this hospitalization (including imaging, microbiology, ancillary and laboratory) are listed below for reference.     Microbiology: Recent Results (from the past 240 hours)  Resp panel by RT-PCR (RSV, Flu A&B, Covid) Anterior Nasal Swab     Status: None   Collection Time: 06/16/24  3:57 PM   Specimen: Anterior Nasal Swab  Result Value Ref Range Status   SARS Coronavirus 2 by RT PCR NEGATIVE NEGATIVE Final    Comment: (NOTE) SARS-CoV-2 target nucleic acids are NOT DETECTED.  The SARS-CoV-2 RNA is generally detectable in upper respiratory specimens during the acute phase of infection. The lowest concentration of SARS-CoV-2 viral copies this assay can detect is 138 copies/mL. A negative result does not preclude SARS-Cov-2 infection and should not be used as the sole basis for treatment or other patient management decisions.  A negative result may occur with  improper specimen collection/handling, submission of specimen other than nasopharyngeal swab, presence of viral mutation(s) within the areas targeted by this assay, and inadequate number of viral copies(<138 copies/mL). A negative result must be combined with clinical observations, patient history, and epidemiological information. The expected result is Negative.  Fact Sheet for Patients:  BloggerCourse.com  Fact Sheet for Healthcare Providers:  SeriousBroker.it  This test is no t yet approved or cleared by the [place]  FDA and  has been authorized for detection and/or diagnosis of SARS-CoV-2 by FDA under an Emergency Use Authorization (EUA). This EUA will remain  in effect (meaning this test can be used) for the duration of the COVID-19 declaration under Section 564(b)(1) of the Act, 21 U.S.C.section 360bbb-3(b)(1), unless the authorization is terminated  or revoked sooner.       Influenza A by PCR NEGATIVE NEGATIVE Final   Influenza B by PCR NEGATIVE NEGATIVE Final    Comment: (NOTE) The Xpert Xpress SARS-CoV-2/FLU/RSV plus assay is intended as an aid in the diagnosis of influenza from Nasopharyngeal swab specimens and should not be used as a sole basis for treatment. Nasal washings and aspirates are unacceptable for Xpert Xpress SARS-CoV-2/FLU/RSV testing.  Fact Sheet for Patients: BloggerCourse.com  Fact Sheet for Healthcare Providers: SeriousBroker.it  This test is not yet approved or cleared by the [place]  FDA and has been authorized for detection and/or diagnosis of SARS-CoV-2 by FDA under an Emergency Use Authorization (EUA). This EUA will remain in effect (meaning this test can be used) for the duration of the COVID-19 declaration under Section 564(b)(1) of the Act, 21 U.S.C. section 360bbb-3(b)(1), unless the authorization  is terminated or revoked.     Resp Syncytial Virus by PCR NEGATIVE NEGATIVE Final    Comment: (NOTE) Fact Sheet for Patients: BloggerCourse.com  Fact Sheet for Healthcare Providers: SeriousBroker.it  This test is not yet approved or cleared by the [place]  FDA and has been authorized for detection and/or diagnosis of SARS-CoV-2 by FDA under an Emergency Use Authorization (EUA). This EUA will  remain in effect (meaning this test can be used) for the duration of the COVID-19 declaration under Section 564(b)(1) of the Act, 21 U.S.C. section 360bbb-3(b)(1), unless the authorization is terminated or revoked.  Performed at [facility], [address] [place], [place] [address]      Labs: BNP (last 3 results) Recent Labs    02/23/24 2258 06/16/24 1441  BNP 51.8 64.2   Basic Metabolic Panel: Recent Labs  Lab 06/16/24 1431 06/16/24 1801 06/17/24 0422 06/18/24 0423 06/19/24 0340 06/20/24 0404  NA 139  --  136 138 139 136  K 3.3*  --  2.9* 3.4* 4.6 4.1  CL 97*  --  96* 99 100 97*  CO2 33*  --  31 30 28 28   GLUCOSE 118*  --  177* 127* 143* 120*  BUN 14  --  14 17 18 21   CREATININE 1.00  --  1.07* 0.88 1.01* 0.78  CALCIUM 9.1  --  8.6* 8.9 9.2 9.2  MG  --  2.6* 2.3 2.3 2.3 2.5*  PHOS  --   --  3.1 3.1 3.2 3.7   Liver Function Tests: Recent Labs  Lab 06/16/24 1431 06/18/24 0423 06/19/24 0340 06/20/24 0404  AST 16 14* 14* 18  ALT 17 16 15 16   ALKPHOS 82 81 84 85  BILITOT 0.5 0.6 0.5 0.6  PROT 7.8 7.3 7.6 7.6  ALBUMIN 3.0* 2.9* 3.1* 3.1*   No results for input(s): LIPASE, AMYLASE in the last 168 hours. No results for input(s): AMMONIA in the last 168 hours. CBC: Recent Labs  Lab 06/16/24 1431 06/17/24 0422 06/18/24 0423 06/19/24 0340 06/20/24 0404  WBC 11.5* 10.8* 11.1* 11.0* 10.5  NEUTROABS  --   --  6.4 6.2 5.2  HGB 11.3* 10.3* 10.8* 11.2* 11.5*  HCT 39.1 35.0* 36.2 38.0  42.4  MCV 87.5 87.7 87.2 89.0 95.7  PLT 561* 481* 517* 531* 482*   Cardiac Enzymes: No results for input(s): CKTOTAL, CKMB, CKMBINDEX, TROPONINI in the last 168 hours. BNP: Invalid input(s): POCBNP CBG: Recent Labs  Lab 06/19/24 2043 06/20/24 0812 06/20/24 1137 06/20/24 1201 06/20/24 1225  GLUCAP 112* 155* 55* 68* 94   D-Dimer No results for input(s): DDIMER in the last 72 hours. Hgb A1c No results for input(s): HGBA1C in the last 72 hours. Lipid Profile No results for input(s): CHOL, HDL, LDLCALC, TRIG, CHOLHDL, LDLDIRECT in the last 72 hours. Thyroid function studies No results for input(s): TSH, T4TOTAL, T3FREE, THYROIDAB in the last 72 hours.  Invalid input(s): FREET3 Anemia work up Recent Labs    06/19/24 0340  VITAMINB12 566  FOLATE 9.4  FERRITIN 123  TIBC 251  IRON 29   Urinalysis    Component Value Date/Time   COLORURINE YELLOW 06/16/2024 1903   APPEARANCEUR CLEAR 06/16/2024 1903   LABSPEC 1.025 06/16/2024 1903   PHURINE 5.0 06/16/2024 1903   GLUCOSEU >=500 (A) 06/16/2024 1903   HGBUR NEGATIVE 06/16/2024 1903   BILIRUBINUR NEGATIVE 06/16/2024 1903   BILIRUBINUR negative 07/06/2023 1619   KETONESUR NEGATIVE 06/16/2024 1903   PROTEINUR NEGATIVE 06/16/2024 1903   UROBILINOGEN 0.2 07/06/2023 1619   UROBILINOGEN 0.2 01/16/2020 1152   NITRITE NEGATIVE 06/16/2024 1903   LEUKOCYTESUR NEGATIVE 06/16/2024 1903   Sepsis Labs Recent Labs  Lab 06/17/24 0422 06/18/24 0423 06/19/24 0340 06/20/24 0404  WBC 10.8* 11.1* 11.0* 10.5   Microbiology Recent Results (from the past 240 hours)  Resp panel by RT-PCR (RSV, Flu A&B, Covid) Anterior Nasal Swab  Status: None   Collection Time: 06/16/24  3:57 PM   Specimen: Anterior Nasal Swab  Result Value Ref Range Status   SARS Coronavirus 2 by RT PCR NEGATIVE NEGATIVE Final    Comment: (NOTE) SARS-CoV-2 target nucleic acids are NOT DETECTED.  The SARS-CoV-2 RNA is generally  detectable in upper respiratory specimens during the acute phase of infection. The lowest concentration of SARS-CoV-2 viral copies this assay can detect is 138 copies/mL. A negative result does not preclude SARS-Cov-2 infection and should not be used as the sole basis for treatment or other patient management decisions. A negative result may occur with  improper specimen collection/handling, submission of specimen other than nasopharyngeal swab, presence of viral mutation(s) within the areas targeted by this assay, and inadequate number of viral copies(<138 copies/mL). A negative result must be combined with clinical observations, patient history, and epidemiological information. The expected result is Negative.  Fact Sheet for Patients:  BloggerCourse.com  Fact Sheet for Healthcare Providers:  SeriousBroker.it  This test is no t yet approved or cleared by the [place]  FDA and  has been authorized for detection and/or diagnosis of SARS-CoV-2 by FDA under an Emergency Use Authorization (EUA). This EUA will remain  in effect (meaning this test can be used) for the duration of the COVID-19 declaration under Section 564(b)(1) of the Act, 21 U.S.C.section 360bbb-3(b)(1), unless the authorization is terminated  or revoked sooner.       Influenza A by PCR NEGATIVE NEGATIVE Final   Influenza B by PCR NEGATIVE NEGATIVE Final    Comment: (NOTE) The Xpert Xpress SARS-CoV-2/FLU/RSV plus assay is intended as an aid in the diagnosis of influenza from Nasopharyngeal swab specimens and should not be used as a sole basis for treatment. Nasal washings and aspirates are unacceptable for Xpert Xpress SARS-CoV-2/FLU/RSV testing.  Fact Sheet for Patients: BloggerCourse.com  Fact Sheet for Healthcare Providers: SeriousBroker.it  This test is not yet approved or cleared by the [place]  FDA  and has been authorized for detection and/or diagnosis of SARS-CoV-2 by FDA under an Emergency Use Authorization (EUA). This EUA will remain in effect (meaning this test can be used) for the duration of the COVID-19 declaration under Section 564(b)(1) of the Act, 21 U.S.C. section 360bbb-3(b)(1), unless the authorization is terminated or revoked.     Resp Syncytial Virus by PCR NEGATIVE NEGATIVE Final    Comment: (NOTE) Fact Sheet for Patients: BloggerCourse.com  Fact Sheet for Healthcare Providers: SeriousBroker.it  This test is not yet approved or cleared by the [place]  FDA and has been authorized for detection and/or diagnosis of SARS-CoV-2 by FDA under an Emergency Use Authorization (EUA). This EUA will remain in effect (meaning this test can be used) for the duration of the COVID-19 declaration under Section 564(b)(1) of the Act, 21 U.S.C. section 360bbb-3(b)(1), unless the authorization is terminated or revoked.  Performed at [facility], [address], [place], [place] [address]      Time coordinating discharge: 38 min  SIGNED:   Almarie KANDICE Hoots, MD  [facility] 06/20/2024, 5:10 PM

## 2024-06-20 NOTE — Progress Notes (Signed)
 AVS given to patient and explained at the bedside. Medications and follow up appointments have been explained with pt verbalizing understanding.

## 2024-06-20 NOTE — Progress Notes (Signed)
   06/20/24 0919  TOC Brief Assessment  Insurance and Status Reviewed  Patient has primary care physician Yes  Home environment has been reviewed Resides alone  Prior level of function: Independent with ADLs at baseline  Prior/Current Home Services No current home services  Social Drivers of Health Review SDOH reviewed no interventions necessary  Readmission risk has been reviewed Yes  Transition of care needs no transition of care needs at this time

## 2024-06-20 NOTE — Progress Notes (Signed)
 Hypoglycemic Event  CBG: 55  Treatment: 8 oz juice/soda  Symptoms: None  Follow-up CBG: Time:1225 CBG Result:94  Possible Reasons for Event: Inadequate meal intake  Comments/MD notified:    Gustavo DELENA Dimes

## 2024-06-20 NOTE — Plan of Care (Signed)
  Problem: Fluid Volume: Goal: Ability to maintain a balanced intake and output will improve Outcome: Progressing   Problem: Metabolic: Goal: Ability to maintain appropriate glucose levels will improve Outcome: Progressing   Problem: Clinical Measurements: Goal: Diagnostic test results will improve Outcome: Progressing Goal: Respiratory complications will improve Outcome: Progressing Goal: Cardiovascular complication will be avoided Outcome: Progressing
